# Patient Record
Sex: Male | Born: 1935 | Race: White | Hispanic: No | Marital: Married | Smoking: Former smoker
Health system: Southern US, Community
[De-identification: ages and names within clinical notes are randomized; demographics above are authoritative.]

## PROBLEM LIST (undated history)

## (undated) DIAGNOSIS — E039 Hypothyroidism, unspecified: Secondary | ICD-10-CM

## (undated) DIAGNOSIS — I5032 Chronic diastolic (congestive) heart failure: Secondary | ICD-10-CM

## (undated) DIAGNOSIS — Z951 Presence of aortocoronary bypass graft: Secondary | ICD-10-CM

## (undated) DIAGNOSIS — I714 Abdominal aortic aneurysm, without rupture, unspecified: Secondary | ICD-10-CM

## (undated) DIAGNOSIS — Z860100 Personal history of colon polyps, unspecified: Secondary | ICD-10-CM

## (undated) DIAGNOSIS — I252 Old myocardial infarction: Secondary | ICD-10-CM

## (undated) DIAGNOSIS — I10 Essential (primary) hypertension: Secondary | ICD-10-CM

## (undated) DIAGNOSIS — M1990 Unspecified osteoarthritis, unspecified site: Secondary | ICD-10-CM

## (undated) DIAGNOSIS — R399 Unspecified symptoms and signs involving the genitourinary system: Secondary | ICD-10-CM

## (undated) DIAGNOSIS — K5730 Diverticulosis of large intestine without perforation or abscess without bleeding: Secondary | ICD-10-CM

## (undated) DIAGNOSIS — C8590 Non-Hodgkin lymphoma, unspecified, unspecified site: Secondary | ICD-10-CM

## (undated) DIAGNOSIS — N400 Enlarged prostate without lower urinary tract symptoms: Secondary | ICD-10-CM

## (undated) DIAGNOSIS — Z973 Presence of spectacles and contact lenses: Secondary | ICD-10-CM

## (undated) DIAGNOSIS — E782 Mixed hyperlipidemia: Secondary | ICD-10-CM

## (undated) DIAGNOSIS — Z955 Presence of coronary angioplasty implant and graft: Secondary | ICD-10-CM

## (undated) DIAGNOSIS — I4510 Unspecified right bundle-branch block: Secondary | ICD-10-CM

## (undated) DIAGNOSIS — C7951 Secondary malignant neoplasm of bone: Principal | ICD-10-CM

## (undated) DIAGNOSIS — R918 Other nonspecific abnormal finding of lung field: Secondary | ICD-10-CM

## (undated) DIAGNOSIS — G629 Polyneuropathy, unspecified: Secondary | ICD-10-CM

## (undated) DIAGNOSIS — C61 Malignant neoplasm of prostate: Secondary | ICD-10-CM

## (undated) DIAGNOSIS — I2510 Atherosclerotic heart disease of native coronary artery without angina pectoris: Secondary | ICD-10-CM

## (undated) DIAGNOSIS — Z972 Presence of dental prosthetic device (complete) (partial): Secondary | ICD-10-CM

## (undated) DIAGNOSIS — R339 Retention of urine, unspecified: Secondary | ICD-10-CM

## (undated) DIAGNOSIS — Z952 Presence of prosthetic heart valve: Secondary | ICD-10-CM

## (undated) DIAGNOSIS — R011 Cardiac murmur, unspecified: Secondary | ICD-10-CM

## (undated) HISTORY — DX: Chronic diastolic (congestive) heart failure: I50.32

## (undated) HISTORY — DX: Secondary malignant neoplasm of bone: C79.51

## (undated) HISTORY — PX: COLONOSCOPY: SHX174

## (undated) HISTORY — PX: CARDIAC CATHETERIZATION: SHX172

## (undated) HISTORY — PX: CORONARY ANGIOPLASTY WITH STENT PLACEMENT: SHX49

## (undated) HISTORY — DX: Malignant neoplasm of prostate: C61

## (undated) HISTORY — PX: CATARACT EXTRACTION W/ INTRAOCULAR LENS  IMPLANT, BILATERAL: SHX1307

## (undated) HISTORY — PX: CORONARY ARTERY BYPASS GRAFT: SHX141

## (undated) HISTORY — PX: TRANSTHORACIC ECHOCARDIOGRAM: SHX275

## (undated) HISTORY — DX: Non-Hodgkin lymphoma, unspecified, unspecified site: C85.90

## (undated) HISTORY — DX: Cardiac murmur, unspecified: R01.1

## (undated) HISTORY — PX: KNEE ARTHROSCOPY: SUR90

## (undated) HISTORY — DX: Hypothyroidism, unspecified: E03.9

---

## 1971-10-09 HISTORY — PX: HEMORRHOID SURGERY: SHX153

## 2001-02-05 ENCOUNTER — Other Ambulatory Visit: Admission: RE | Admit: 2001-02-05 | Discharge: 2001-02-05 | Payer: Self-pay | Admitting: Internal Medicine

## 2001-07-22 ENCOUNTER — Ambulatory Visit (HOSPITAL_COMMUNITY): Admission: RE | Admit: 2001-07-22 | Discharge: 2001-07-22 | Payer: Self-pay | Admitting: Gastroenterology

## 2001-07-22 ENCOUNTER — Encounter (INDEPENDENT_AMBULATORY_CARE_PROVIDER_SITE_OTHER): Payer: Self-pay | Admitting: *Deleted

## 2003-12-29 ENCOUNTER — Inpatient Hospital Stay (HOSPITAL_COMMUNITY): Admission: EM | Admit: 2003-12-29 | Discharge: 2004-01-01 | Payer: Self-pay | Admitting: Emergency Medicine

## 2004-03-15 ENCOUNTER — Encounter: Admission: RE | Admit: 2004-03-15 | Discharge: 2004-03-15 | Payer: Self-pay | Admitting: Internal Medicine

## 2004-09-19 ENCOUNTER — Encounter: Admission: RE | Admit: 2004-09-19 | Discharge: 2004-09-19 | Payer: Self-pay | Admitting: Internal Medicine

## 2004-09-21 ENCOUNTER — Other Ambulatory Visit: Admission: RE | Admit: 2004-09-21 | Discharge: 2004-09-21 | Payer: Self-pay | Admitting: Otolaryngology

## 2004-10-03 ENCOUNTER — Encounter (INDEPENDENT_AMBULATORY_CARE_PROVIDER_SITE_OTHER): Payer: Self-pay | Admitting: Specialist

## 2004-10-03 ENCOUNTER — Encounter (INDEPENDENT_AMBULATORY_CARE_PROVIDER_SITE_OTHER): Payer: Self-pay | Admitting: Otolaryngology

## 2004-10-03 ENCOUNTER — Ambulatory Visit (HOSPITAL_COMMUNITY): Admission: RE | Admit: 2004-10-03 | Discharge: 2004-10-03 | Payer: Self-pay | Admitting: Otolaryngology

## 2004-10-03 HISTORY — PX: OTHER SURGICAL HISTORY: SHX169

## 2004-10-11 ENCOUNTER — Other Ambulatory Visit: Admission: RE | Admit: 2004-10-11 | Discharge: 2004-10-11 | Payer: Self-pay | Admitting: Otolaryngology

## 2004-10-12 ENCOUNTER — Ambulatory Visit: Payer: Self-pay | Admitting: Hematology & Oncology

## 2004-10-19 ENCOUNTER — Ambulatory Visit: Payer: Self-pay | Admitting: Hematology & Oncology

## 2004-10-22 ENCOUNTER — Ambulatory Visit (HOSPITAL_COMMUNITY): Admission: RE | Admit: 2004-10-22 | Discharge: 2004-10-22 | Payer: Self-pay | Admitting: Hematology & Oncology

## 2004-10-24 ENCOUNTER — Ambulatory Visit (HOSPITAL_COMMUNITY): Admission: RE | Admit: 2004-10-24 | Discharge: 2004-10-24 | Payer: Self-pay | Admitting: Hematology & Oncology

## 2004-10-24 ENCOUNTER — Encounter (INDEPENDENT_AMBULATORY_CARE_PROVIDER_SITE_OTHER): Payer: Self-pay | Admitting: Specialist

## 2004-10-26 ENCOUNTER — Encounter: Admission: RE | Admit: 2004-10-26 | Discharge: 2004-10-26 | Payer: Self-pay | Admitting: Hematology & Oncology

## 2004-11-29 ENCOUNTER — Ambulatory Visit: Payer: Self-pay | Admitting: Hematology & Oncology

## 2004-12-04 ENCOUNTER — Ambulatory Visit (HOSPITAL_COMMUNITY): Admission: RE | Admit: 2004-12-04 | Discharge: 2004-12-04 | Payer: Self-pay | Admitting: Hematology & Oncology

## 2005-01-22 ENCOUNTER — Ambulatory Visit: Payer: Self-pay | Admitting: Hematology & Oncology

## 2005-02-26 ENCOUNTER — Ambulatory Visit (HOSPITAL_COMMUNITY): Admission: RE | Admit: 2005-02-26 | Discharge: 2005-02-26 | Payer: Self-pay | Admitting: Hematology & Oncology

## 2005-03-02 ENCOUNTER — Ambulatory Visit (HOSPITAL_COMMUNITY): Admission: RE | Admit: 2005-03-02 | Discharge: 2005-03-02 | Payer: Self-pay | Admitting: Hematology & Oncology

## 2005-03-12 ENCOUNTER — Ambulatory Visit: Admission: RE | Admit: 2005-03-12 | Discharge: 2005-05-31 | Payer: Self-pay | Admitting: Radiation Oncology

## 2005-03-20 ENCOUNTER — Ambulatory Visit: Payer: Self-pay | Admitting: Dentistry

## 2005-03-20 ENCOUNTER — Encounter: Admission: EM | Admit: 2005-03-20 | Discharge: 2005-03-20 | Payer: Self-pay | Admitting: Dentistry

## 2005-05-08 ENCOUNTER — Emergency Department (HOSPITAL_COMMUNITY): Admission: EM | Admit: 2005-05-08 | Discharge: 2005-05-09 | Payer: Self-pay | Admitting: Emergency Medicine

## 2005-07-02 ENCOUNTER — Ambulatory Visit: Payer: Self-pay | Admitting: Hematology & Oncology

## 2005-07-04 ENCOUNTER — Ambulatory Visit (HOSPITAL_COMMUNITY): Admission: RE | Admit: 2005-07-04 | Discharge: 2005-07-04 | Payer: Self-pay | Admitting: Hematology & Oncology

## 2005-07-09 ENCOUNTER — Ambulatory Visit (HOSPITAL_COMMUNITY): Admission: RE | Admit: 2005-07-09 | Discharge: 2005-07-09 | Payer: Self-pay | Admitting: Hematology & Oncology

## 2005-10-08 DIAGNOSIS — C8590 Non-Hodgkin lymphoma, unspecified, unspecified site: Secondary | ICD-10-CM

## 2005-10-08 HISTORY — DX: Non-Hodgkin lymphoma, unspecified, unspecified site: C85.90

## 2005-10-10 ENCOUNTER — Ambulatory Visit (HOSPITAL_COMMUNITY): Admission: RE | Admit: 2005-10-10 | Discharge: 2005-10-10 | Payer: Self-pay | Admitting: Hematology & Oncology

## 2005-10-24 ENCOUNTER — Ambulatory Visit: Payer: Self-pay | Admitting: Hematology & Oncology

## 2006-01-09 ENCOUNTER — Ambulatory Visit (HOSPITAL_COMMUNITY): Admission: RE | Admit: 2006-01-09 | Discharge: 2006-01-09 | Payer: Self-pay | Admitting: Hematology & Oncology

## 2006-01-22 ENCOUNTER — Ambulatory Visit: Payer: Self-pay | Admitting: Hematology & Oncology

## 2006-01-23 LAB — CBC WITH DIFFERENTIAL/PLATELET
BASO%: 0.4 % (ref 0.0–2.0)
HCT: 41.9 % (ref 38.7–49.9)
HGB: 13.9 g/dL (ref 13.0–17.1)
MCV: 86.8 fL (ref 81.6–98.0)
MONO#: 0.6 10*3/uL (ref 0.1–0.9)
MONO%: 9.6 % (ref 0.0–13.0)
NEUT#: 4.1 10*3/uL (ref 1.5–6.5)
RBC: 4.83 10*6/uL (ref 4.20–5.71)
lymph#: 1.4 10*3/uL (ref 0.9–3.3)

## 2006-01-23 LAB — COMPREHENSIVE METABOLIC PANEL
ALT: 14 U/L (ref 0–40)
AST: 20 U/L (ref 0–37)
Alkaline Phosphatase: 72 U/L (ref 39–117)
CO2: 30 mEq/L (ref 19–32)
Glucose, Bld: 90 mg/dL (ref 70–99)
Potassium: 5.1 mEq/L (ref 3.5–5.3)
Total Bilirubin: 0.7 mg/dL (ref 0.3–1.2)
Total Protein: 6.4 g/dL (ref 6.0–8.3)

## 2006-01-23 LAB — LACTATE DEHYDROGENASE: LDH: 126 U/L (ref 94–250)

## 2006-02-21 ENCOUNTER — Ambulatory Visit (HOSPITAL_COMMUNITY): Admission: RE | Admit: 2006-02-21 | Discharge: 2006-02-21 | Payer: Self-pay | Admitting: General Surgery

## 2006-02-21 HISTORY — PX: INGUINAL HERNIA REPAIR: SUR1180

## 2006-06-17 ENCOUNTER — Ambulatory Visit: Admission: RE | Admit: 2006-06-17 | Discharge: 2006-06-17 | Payer: Self-pay | Admitting: Hematology & Oncology

## 2006-06-27 ENCOUNTER — Ambulatory Visit: Payer: Self-pay | Admitting: Hematology & Oncology

## 2006-07-01 LAB — LACTATE DEHYDROGENASE: LDH: 118 U/L (ref 94–250)

## 2006-07-01 LAB — CBC WITH DIFFERENTIAL/PLATELET
BASO%: 0.9 % (ref 0.0–2.0)
Basophils Absolute: 0 10*3/uL (ref 0.0–0.1)
EOS%: 3.7 % (ref 0.0–7.0)
Eosinophils Absolute: 0.2 10*3/uL (ref 0.0–0.5)
HCT: 41.4 % (ref 38.7–49.9)
HGB: 13.9 g/dL (ref 13.0–17.1)
MCHC: 33.7 g/dL (ref 32.0–35.9)
MCV: 86.2 fL (ref 81.6–98.0)
NEUT%: 54.1 % (ref 40.0–75.0)
WBC: 5.5 10*3/uL (ref 4.0–10.0)
lymph#: 1.7 10*3/uL (ref 0.9–3.3)

## 2006-07-01 LAB — COMPREHENSIVE METABOLIC PANEL
AST: 18 U/L (ref 0–37)
Alkaline Phosphatase: 65 U/L (ref 39–117)
Calcium: 9 mg/dL (ref 8.4–10.5)
Creatinine, Ser: 0.78 mg/dL (ref 0.40–1.50)
Glucose, Bld: 80 mg/dL (ref 70–99)
Total Protein: 6.2 g/dL (ref 6.0–8.3)

## 2006-08-19 ENCOUNTER — Ambulatory Visit (HOSPITAL_COMMUNITY): Admission: RE | Admit: 2006-08-19 | Discharge: 2006-08-19 | Payer: Self-pay | Admitting: Ophthalmology

## 2006-12-25 ENCOUNTER — Ambulatory Visit: Payer: Self-pay | Admitting: Hematology & Oncology

## 2008-10-07 ENCOUNTER — Ambulatory Visit: Payer: Self-pay | Admitting: Diagnostic Radiology

## 2008-10-07 ENCOUNTER — Encounter: Payer: Self-pay | Admitting: Emergency Medicine

## 2008-10-07 ENCOUNTER — Inpatient Hospital Stay (HOSPITAL_COMMUNITY): Admission: AD | Admit: 2008-10-07 | Discharge: 2008-10-08 | Payer: Self-pay | Admitting: Orthopedic Surgery

## 2008-10-07 HISTORY — PX: OTHER SURGICAL HISTORY: SHX169

## 2010-03-20 ENCOUNTER — Encounter: Admission: RE | Admit: 2010-03-20 | Discharge: 2010-03-20 | Payer: Self-pay | Admitting: Orthopedic Surgery

## 2010-03-22 ENCOUNTER — Ambulatory Visit (HOSPITAL_BASED_OUTPATIENT_CLINIC_OR_DEPARTMENT_OTHER): Admission: RE | Admit: 2010-03-22 | Discharge: 2010-03-22 | Payer: Self-pay | Admitting: Orthopedic Surgery

## 2010-10-16 ENCOUNTER — Ambulatory Visit
Admission: RE | Admit: 2010-10-16 | Payer: Self-pay | Source: Home / Self Care | Attending: Interventional Cardiology | Admitting: Interventional Cardiology

## 2010-10-20 ENCOUNTER — Ambulatory Visit (HOSPITAL_COMMUNITY)
Admission: RE | Admit: 2010-10-20 | Discharge: 2010-10-21 | Payer: Self-pay | Source: Home / Self Care | Attending: Interventional Cardiology | Admitting: Interventional Cardiology

## 2010-10-23 LAB — BASIC METABOLIC PANEL
BUN: 11 mg/dL (ref 6–23)
CO2: 29 mEq/L (ref 19–32)
Calcium: 9.2 mg/dL (ref 8.4–10.5)
Chloride: 104 mEq/L (ref 96–112)
Creatinine, Ser: 0.96 mg/dL (ref 0.4–1.5)
GFR calc Af Amer: >60 mL/min (ref >60)
GFR calc non Af Amer: >60 mL/min (ref >60)
Glucose, Bld: 94 mg/dL (ref 70–99)
Potassium: 4.8 mEq/L (ref 3.5–5.1)
Sodium: 139 mEq/L (ref 135–145)

## 2010-10-23 LAB — CBC
HCT: 44.5 % (ref 39.0–52.0)
Hemoglobin: 14.6 g/dL (ref 13.0–17.0)
MCH: 28.6 pg (ref 26.0–34.0)
MCHC: 32.8 g/dL (ref 30.0–36.0)
MCV: 87.3 fL (ref 78.0–100.0)
Platelets: 150 10*3/uL (ref 150–400)
RBC: 5.1 MIL/uL (ref 4.22–5.81)
RDW: 14.2 % (ref 11.5–15.5)
WBC: 6.3 10*3/uL (ref 4.0–10.5)

## 2010-10-29 ENCOUNTER — Encounter: Payer: Self-pay | Admitting: Hematology & Oncology

## 2010-11-16 NOTE — Procedures (Signed)
  NAME:  Tim Campbell, Tim Campbell NO.:  [id number]  MEDICAL RECORD NO.:  [id number]          PATIENT TYPE:  OIB  LOCATION:  6529                         FACILITY:  [facility]  PHYSICIAN:  Lyn Records, M.D.   DATE OF BIRTH:  Nov 29, 1925  DATE OF PROCEDURE:  10/20/2010 DATE OF DISCHARGE:                           CARDIAC CATHETERIZATION   INDICATIONS FOR PROCEDURE:  Progressive class III angina starting 2 months ago.  Recent diagnostic catheterization demonstrating 90% ostial stenosis in the saphenous vein graft to the right coronary.  PROCEDURE PERFORMED:  Bare-metal stent, ostium of the RCA graft.  DESCRIPTION:  After informed consent, we performed insertion of a 6- French arterial sheath in the right femoral.  The modified Seldinger technique was used.  The patient received 2 mg of Versed and 50 mcg of fentanyl.  A bolus followed by an infusion of Angiomax was started.  ACT was documented to be greater than 300 seconds.  The patient had 300 mg of Plavix in the holding area before being brought to the cath lab.  He had been on Plavix for 5 days as an outpatient.  After obtaining guiding shots using a 6-French multipurpose guide catheter, we placed a BMW wire through the ostial stenosis to give [place] support and the vessel.  After that, we advanced a filter wire into the distal body of the graft.  The filter wire was then deployed.  We advanced a 4.0 x 15 mm long Vision stent into the region of stenosis being careful to overlap the ostium with a small portion of stent extending into the aorta.  We then deployed the stent to 14 atmospheres. We then removed the deployment balloon and placed a 4.5 x 12 mm [place] Trek. Four balloon inflations were performed up to a peak pressure of 18 atmospheres.  We then removed this balloon and did more focal angioplasty at the ostium with an 8 mm long x 5.0 mm [place] Trek.  Three balloon inflations were performed to peak pressure of 14  atmospheres. We then retrieved the basket.  Final shots were obtained.  TIMI grade 3 flow was noted.  RESULTS:  90+ percent stenosis reduced to less than 20% with TIMI grade 3 flow, and the development of a wide ostium that appears to be at least 4 mm in diameter.  The distal graft in the region of basket deployment was not injured.  CONCLUSION:  Successful ostial RCA graft stent deployment using bare- metal with resulting less than 20% ostial narrowing.  Postdilatation pressure 18 atmospheres and largest balloon diameter 5.0 mm.  PLAN:  Aspirin and Plavix for at least 6 weeks, but preferably for a year.  Risk factor modification of the patient will allow it.  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     Lyn Records, M.D.     HWS/MEDQ  D:  10/20/2010  T:  10/21/2010  Job:  [id number]  cc:   Candyce Churn, M.D.  Electronically Signed by Verdis Prime M.D. on 11/16/2010 04:57:21 PM

## 2010-11-16 NOTE — Discharge Summary (Signed)
  NAME:  Tim, Campbell NO.:  [id number]  MEDICAL RECORD NO.:  [id number]          PATIENT TYPE:  OIB  LOCATION:  6529                         FACILITY:  [facility]  PHYSICIAN:  Lyn Records, M.D.   DATE OF BIRTH:  23-Feb-1926  DATE OF ADMISSION:  10/20/2010 DATE OF DISCHARGE:  10/21/2010                              DISCHARGE SUMMARY   REASON FOR ADMISSION:  Elective RCA graft stenting.  DISCHARGE DIAGNOSES: 1. Coronary atherosclerosis with class III exertional angina.     a.     A 90% ostial saphenous vein graft to right coronary artery      stenosis.     b.     Successful bare-metal stenting on October 20, 2010. 2. Poor risk factor modifications.     a.     Hyperlipidemia not treated.     b.     Elevated blood pressure, not treated.  PROCEDURE PERFORMED:  Bare-metal stent implantation in ostium of the right coronary, saphenous vein graft.  DISCHARGE INSTRUCTIONS: 1. Follow up with Dr. Verdis Prime on October 27, 2010, at 2:00 p.m. 2. Medications at discharge: 3. Crestor 20 mg per day. 4. Plavix 75 mg per day. 5. Coated aspirin 325 mg per day. 6. Folic acid 1 mg per day. 7. The patient takes multiple naturopathic therapies OTC.  He will     continue these.  CONDITION ON DISCHARGE:  Improved.  HISTORY AND PHYSICAL AND HOSPITAL COURSE:  Please see the admitting information in the patient's chart.  He has been having angina for 2 months.  The patient was found to have a high-grade stenosis in the ostium of the saphenous vein graft to the right coronary by diagnostic cath earlier this week.  On the day of admission, he underwent successful bare-metal stenting of the ostium of the RCA, saphenous vein graft using a 5.0-mm balloon to post dilate.  A nice angiographic result was obtained although there was still some mild balloon waist, had pressures as high as 14 atmospheres with the 5.0-mm balloon.  An 18 atmospheres of pressure with a 4.5-mm balloon did  not demonstrate any evidence of balloon waist.  The patient's creatinine preprocedure is 0.84.  Creatinine will be evaluated prior to discharge home on October 21, 2010.  Followup with Dr. Verdis Prime has been scheduled as outlined above.     Lyn Records, M.D.     HWS/MEDQ  D:  10/20/2010  T:  10/21/2010  Job:  [id number]  cc:   Candyce Churn, M.D.  Electronically Signed by Verdis Prime M.D. on 11/16/2010 04:57:18 PM

## 2010-12-24 LAB — POCT HEMOGLOBIN-HEMACUE: Hemoglobin: 14.7 g/dL (ref 13.0–17.0)

## 2010-12-25 LAB — BASIC METABOLIC PANEL
BUN: 14 mg/dL (ref 6–23)
Calcium: 9.1 mg/dL (ref 8.4–10.5)
Chloride: 105 mEq/L (ref 96–112)
Creatinine, Ser: 0.81 mg/dL (ref 0.4–1.5)
Glucose, Bld: 98 mg/dL (ref 70–99)

## 2011-02-15 ENCOUNTER — Inpatient Hospital Stay (INDEPENDENT_AMBULATORY_CARE_PROVIDER_SITE_OTHER)
Admission: RE | Admit: 2011-02-15 | Discharge: 2011-02-15 | Disposition: A | Discharge status: Home / Self Care | Payer: Medicare Other | Source: Ambulatory Visit | Attending: Family Medicine | Admitting: Family Medicine

## 2011-02-15 ENCOUNTER — Encounter: Payer: Self-pay | Admitting: Family Medicine

## 2011-02-15 DIAGNOSIS — S61409A Unspecified open wound of unspecified hand, initial encounter: Secondary | ICD-10-CM

## 2011-02-19 ENCOUNTER — Telehealth (INDEPENDENT_AMBULATORY_CARE_PROVIDER_SITE_OTHER): Payer: Self-pay | Admitting: *Deleted

## 2011-02-20 NOTE — Op Note (Signed)
NAME:  Tim Campbell, Tim Campbell NO.:  [id number]   MEDICAL RECORD NO.:  [id number]          PATIENT TYPE:  INP   LOCATION:  5033                         FACILITY:  [facility]   PHYSICIAN:  Feliberto Gottron. Turner Daniels, M.D.   DATE OF BIRTH:  31-Jan-1926   DATE OF PROCEDURE:  10/07/2008  DATE OF DISCHARGE:                               OPERATIVE REPORT   PREOPERATIVE DIAGNOSIS:  Right ankle trimalleolar fracture dislocation.   POSTOPERATIVE DIAGNOSIS:  Right ankle trimalleolar fracture dislocation.   PROCEDURE:  Open reduction and internal fixation of right ankle  trimalleolar fracture dislocation with two medial 4 mm Synthes  cancellous lag screw and laterally a 7-hole one-third tubular  neutralization plate with 7 screws and a single anterior-to-posterior  interfragmentary screw.   SURGEON:  Feliberto Gottron. Turner Daniels, MD   FIRST ASSISTANT:  Shirl Harris, PA-C   ANESTHETIC:  General endotracheal.   ESTIMATED BLOOD LOSS:  100 mL   FLUID REPLACEMENT:  1200 mL of crystalloid.   DRAINS PLACED:  None.   TOURNIQUET TIME:  45 minutes.   INDICATIONS FOR PROCEDURE:  This is an 77 year old gentleman who slipped  on the ice going out to get this newspaper on the morning of October 07, 2008, was transported to the [facility] Emergency Room on 68.  X-  rays were obtained.  Fracture dislocation was noted.  A closed reduction  was accomplished in the emergency room and he was placed in the  posterior splint and Orthopedic consultation obtained.  He was  transferred to [facility] for surgical intervention and  for appropriate preoperative workup and counseling was taken to the  operating room.  The risks and benefits of surgery were discussed and  questions answered.  The indications for surgery was an unstable  fracture dislocation and the purpose of the surgery is to stabilize the  ankle, decrease pain, and increase function.   DESCRIPTION OF PROCEDURE:  The patient identified by  armband and taken  to the preop area at [facility] where the appropriate anesthetic  monitors were attached and 2 g of Ancef were given intravenously  preoperatively.  He was then taken to operating room floor, the  appropriate anesthetic monitors were attached and general endotracheal  anesthesia was induced.  Tourniquet was applied high to the right calf  and the right lower extremity prepped and draped in usual sterile  fashion from the toes to the tourniquet.  The patient did have a small  superficial abrasion medially over the medial malleolus that was not  bleeding and went just into the epidermis, it was clean.  The limb was  then prepped and draped in usual sterile fashion.  Standard time-out  procedure was performed.  The limb wrapped with an Esmarch bandage,  tourniquet inflated to 300 mmHg, and began the operation by making a  longitudinal medial incision starting just distal to the tip of the  medial malleolus and going proximally and slightly posteriorly over a  distance of about 6 cm through the skin and subcutaneous tissue.  Small  bleeders were identified and cauterized.  The  periosteum over the  fracture site was noted to be torn.  We immediately identified the major  medial malleolus fracture fragments, they were intact distally with some  comminution posteriorly.  The edge of the periosteum were cleaned off  the fracture site.  The wound was irrigated out with normal saline  solution.  One small bit of cartilage was removed from the joint itself  under direct vision and then we reduced the fracture using a calibrated  tenaculum and providing provisional fixation with a single K-wire going  through the tip of the medial malleolus up into the metaphyseal flare of  the ankle.  The guide pin was placed anteriorly and posteriorly to this.  We went ahead and drilled with the 2.5 drill from the Synthes set going  from the tip of the malleolus again up into the metaphysis,  drilled the  distal fragment at 3.5 mm and then placed the posterior fixation screw,  through the tip of the medial malleolus, through the fracture site, and  lagged the malleolus back down to the tibia.  The anterior guide pin was  then removed and a second lag screw was placed obtaining good firm  fixation of the medial malleolus anteriorly and straight medially.  Posteriorly, there was some comminution near the posterior tib tendon.  This was felt to not be amenable to fixation minimal, but a flap of  periosteum was placed over it for closure.  We then directed our  attention laterally where a straight longitudinal incision was made from  segment of lateral malleolus proximally for about 8 cm through the skin  and subcutaneous tissue.  Small bleeders were identified and cauterized.  Small branches of the superficial peroneal nerve and sural nerve were  identified and protected.  The periosteum of the fracture site was  reflected anteriorly and posteriorly visualizing the SE-2 fracture  pattern.  This was then cleansed with normal saline solution and using a  lion-jaw clamp we were able to reduce the lateral malleolus anatomically  and fix it with an anterior-to-posterior 3.5 lag screw.  A 7-hole one-  third tubular plate was then contoured to fit the lateral fibula and  fixed with 4 proximal bicortical screws and 3 distal unicortical 4 mm  screws.  C-arm images were then taken in the AP, mortise, and lateral  planes confirming near anatomic reduction and the tourniquet was let  down.  Small bleeders were identified and cauterized.  The subcutaneous  tissue was closed with running 3-0 nylon suture and the skin itself with  running interlocking 3-0 Vicryl suture.  A dressing of Xeroform, 4x4  dressing, sponges, Webril, Ace wrap, and a standard large Cam walker  boot were then applied.  The patient was then awakened and taken to the  recovery room without difficulty.      Feliberto Gottron.  Turner Daniels, M.D.  Electronically Signed     FJR/MEDQ  D:  10/07/2008  T:  10/08/2008  Job:  [id number]

## 2011-02-23 NOTE — Cardiovascular Report (Signed)
NAME:  Tim Campbell, Tim Campbell NO.:  [id number]   MEDICAL RECORD NO.:  [id number]                   PATIENT TYPE:  INP   LOCATION:  4741                                 FACILITY:  [facility]   PHYSICIAN:  Francisca December, M.D.               DATE OF BIRTH:  [birth year]   DATE OF PROCEDURE:  12/30/2003  DATE OF DISCHARGE:                              CARDIAC CATHETERIZATION   PROCEDURE:  1. Left heart catheterization.  2. Coronary angiography.  3. Left ventriculogram.  4. Saphenous vein graft angiography.  5. Arterial conduit angiography.   INDICATIONS FOR PROCEDURE:  [name] is a 77 year old man who is  now 12 years status post coronary artery bypass grafting. He has done well  until the recent past, when he developed an accelerating anginal syndrome.  He had prolonged chest discomfort yesterday and has ruled in for a non-ST  segment elevation myocardial infarction. Peak CK if 100. Troponin 19. He is  without chest discomfort currently. He is brought to the catheterization lab  at this time to identify the extent of disease and provide for further  therapeutic options.   DESCRIPTION OF PROCEDURE:  The patient was brought to the cardiac  catheterization laboratory in the fasting state. The right groin was prepped  and draped in the usual sterile fashion. Local anesthesia was obtained with  infiltration of 1% Lidocaine. A 6 French catheter sheath was inserted  percutaneously into the right femoral artery, utilizing an anterior approach  over a guiding J-wire. A 110 cm pigtail catheter was used to measure  pressures in the ascending aorta and in the left ventricle, both prior to  and following the ventriculogram. A 30 degree RAO cine left ventriculogram  was performed utilizing a power injector. Forty five cc of non-ionic  contrast material were injected at 13 cc per second. The pigtail catheter  was then exchanged for a 6 French #4 left Judkins catheter.  Cine angiography  of left coronary artery was conducted in LAO and RAO projections. The left  Judkins catheter was then exchanged for a 6 French #4 right Judkins  catheter. Cine angiography of the right coronary to saphenous vein graft to  the diagonal branch, the saphenous vein graft to the obtuse marginal were  then performed. The right coronary catheter was then manipulated into the  left subclavian artery. Using an exchange length wire, it was removed and  exchanged for a LIMA catheter. Cine angiography of the LIMA to the LAD was  connected in AP, LAO, and RAO projections. The LIMA catheter was then  exchanged for a RCB catheter. Cine angiography of the saphenous vein graft  to the right coronary artery was conducted in LAO and RAO projections. At  the completion of the procedure, the catheter and catheter sheath were  removed. Hemostasis was achieved by direct pressure. The patient was  transported to the recovery area in stable condition  with an intact distal  pulse. It should be noted that all catheter manipulations were performed  using fluoroscopic observation and exchanges performed over the long guiding  J-wire.   HEMODYNAMICS:  Systemic arterial pressure was 104/65 with a mean of 82 mmHg.  There was no systolic gradient across the aortic valve. The left ventricular  end diastolic pressure was 17 mmHg.   ANGIOGRAPHY:  The left ventriculogram demonstrated normal size and mildly  reduced global systolic function with a visually estimated ejection fraction  of 45%. There was anterolateral mild dyskinesis that was focal. There was  inferobasal akinesis. There was mild mitral regurgitation. The aortic valve  was trileaflet and opened normally during systole. There was left and right  coronary calcification seen.   There was a right dominant coronary system present. The main left coronary  artery was short and normal. The left anterior descending artery and its  branches were  highly diseased; the left vessels completely occluded after  the origin of her septal perforator. It was diffusely diseased proximal to  that.   The left circumflex coronary is small and gives rise only to a small  posterolateral branch. There is also a tiny first marginal branch. It is  diffusely diseased but without significant obstruction.   The right coronary artery is completely occluded in the mid portion. There  is a small right jump collateral, barely filling the distal vessel. There is  a moderate sized acute marginal that has an 80% stenosis at its origin.   SAPHENOUS VEIN GRAFT ANGIOGRAPHY:  The saphenous vein graft to the diagonal  branch is completely occluded about 2 cm from its origin and has a  thrombotic appearance with a meniscus.   The saphenous vein graft to the obtuse marginal is mildly, diffusely  diseased with luminal irregularities but no significant obstruction. It  inserts upon a large to moderate size marginal branch and provides antegrade  and retrograde filling. The retrograde filling is trivial in nature. The  antegrade filling reaches the apex. There are no significant obstructions in  the native artery.   The saphenous vein graft to the right coronary artery is widely patent and  without significant obstruction or even luminal irregularity. It inserts  upon the posterior descending artery and then continues onto a large left  ventricular branch. This could even be a distal left circumflex  posterolateral branch. There are no obstructions in the conduit itself,  either to the posterior descending artery or extending to the posterolateral  branch. There are no significant obstructions in the native artery and the  anastomoses are free of obstruction as well.   The left internal mammary artery graft to the left anterior descending is  widely patent and inserts upon the mid to distal anterior descending artery. It provides antegrade and retrograde  filling. Antegrade filling reaches and  barely traverses the apex. The artery is diffusely diseased but without  significant obstruction, retrograde filling provides flow to 3 small septal  perforators and then is completely occluded proximally. No obstructions are  seen in the insertion site of the left internal mammary artery to the left  anterior descending.   Very faint collateral filling is seen to an extremely small diagonal branch  off of the left anterior descending.   IMPRESSION:  1. Atherosclerotic coronary vascular disease, severe 3 vessel.  2. Recent occlusion of saphenous vein graft to the diagonal, causing non-ST     segment elevation myocardial infarction, anterolateral.  3. Mildly diminished left  ventricular global systolic function. Ejection     fraction 45%, regional wall motion abnormality is as noted.  4. Very mildly elevated left ventricular end diastolic pressure.   PLAN/RECOMMENDATIONS:  With a peak CK of 100, dyskinesis in the  anterolateral and a complete occlusion of the marginal branch, I do not feel  that intervention is at all warranted. Medical therapy only is appropriate.  He will be continued on his current medications with the addition of Plavix  to his aspirin and other medications.                                               Francisca December, M.D.    JHE/MEDQ  D:  12/30/2003  T:  01/02/2004  Job:  [id number]   cc:   Lyn Records III, M.D.  [address]  [address]  [place]  [place] [address]  Fax: [phone]   Candyce Churn, M.D.  [address] [place]  [place] [address]  Fax: [phone]

## 2011-02-23 NOTE — Op Note (Signed)
NAME:  Tim Campbell, Tim Campbell NO.:  [id number]   MEDICAL RECORD NO.:  [id number]          PATIENT TYPE:  AMB   LOCATION:  SDS                          FACILITY:  [facility]   PHYSICIAN:  Adolph Pollack, M.D.DATE OF BIRTH:  1925/10/18   DATE OF PROCEDURE:  02/21/2006  DATE OF DISCHARGE:                                 OPERATIVE REPORT   PREOPERATIVE DIAGNOSIS:  Right inguinal hernia.   POSTOPERATIVE DIAGNOSIS:  Right inguinal hernia (indirect).   PROCEDURE:  Right inguinal hernia repair with mesh.   SURGEON:  Adolph Pollack, M.D.   ASSISTANT:  Devra Dopp, M.D.   ANESTHESIA:  General plus mixture of 0.5% Marcaine and 1% lidocaine with  epinephrine.   INDICATIONS:  [name] is a 77 year old male who was having a right  inguinal bulge, it is uncomfortable, and he has a hernia.  He is  progressively having more discomfort from it.  He now he now presents for  right inguinal hernia repair with mesh.  We have discussed the procedure  risks and aftercare preoperatively.   TECHNIQUE:  He was seen in the holding area and the right groin marked with  my initials.  He is then brought to the operating room, placed supine on the  operating table, and general anesthetic was administered.  The hair in the  right groin was clipped and the area sterilely prepped and draped.  Local  anesthetic was infiltrated superficially and deep in the right groin and the  right groin incision was made through the skin, subcutaneous tissue, and  Scarpa's fascia until the external oblique aponeurosis was exposed.  More  local anesthetic was infiltrated deep to the external oblique aponeurosis  then an incision was made in the external oblique aponeurosis through the  internal ring medially and up toward the anterior superior iliac spine  laterally.  Using blunt dissection, the shelving edge of the inguinal  ligament was exposed inferiorly and the internal oblique muscle and  aponeurosis were exposed superiorly.  I then isolated the spermatic cord.  An indirect sac was noted with extraperitoneal fatty adherent to it.  This  was dissected free from the cord and reduced back to a patulous internal  ring.   Following this, a piece of 3 by 6 inch polypropylene mesh was brought into  the field and anchored 1 cm medial to the pubic tubercle with 2-0 Prolene  suture.  The inferior aspect of the mesh was anchored to the shelving edge  of the inguinal ligament with a running 2-0 Prolene suture until an area 1  cm lateral to the internal ring.  A slit was cut in the mesh and two tails  wrapped around the cord.  The superior aspect of the mesh was then anchored  to the internal oblique muscle and aponeurosis with interrupted 2-0 Vicryl  sutures.  The two tails of the mesh were crossed creating a new internal  ring and these were anchored to the shelving edge of the inguinal ligament  with 2-0 Prolene suture.  The tip of the hemostat was able to be placed in  the new aperture and the cord was mobile.   The lateral aspect of the mesh was then tucked deep to the external oblique  aponeurosis.  Hemostasis was adequate.  More local anesthetic was  infiltrated superiorly.  I then closed the external oblique aponeurosis over  the mesh and  cord with a running 3-0 Vicryl suture.  Scarpa's fascia was approximated  with a running 2-0 Vicryl suture.  The skin was closed with a mono 4-0  Monocryl subcuticular stitch followed by Steri-Strips and sterile dressings.  He tolerated procedure without any apparent complications and was taken to  the recovery in satisfactory condition.      Adolph Pollack, M.D.  Electronically Signed     TJR/MEDQ  D:  02/21/2006  T:  02/21/2006  Job:  [id number]   cc:   Rose Phi. Myna Hidalgo, M.D.  Fax: [phone]   Candyce Churn, M.D.  Fax: [phone]

## 2011-02-23 NOTE — Discharge Summary (Signed)
NAME:  Tim Campbell, Tim Campbell NO.:  [id number]   MEDICAL RECORD NO.:  [id number]                   PATIENT TYPE:  INP   LOCATION:  4741                                 FACILITY:  [facility]   PHYSICIAN:  Francisca December, M.D.               DATE OF BIRTH:  1925-12-05   DATE OF ADMISSION:  12/29/2003  DATE OF DISCHARGE:  01/01/2004                                 DISCHARGE SUMMARY   ADMISSION DIAGNOSES:  1. Chest pain with non-Q wave myocardial infarction.  2. Coronary artery disease status post coronary artery bypass graft in 1993.  3. Hyperlipidemia.  4. Medical noncompliance.   DISCHARGE DIAGNOSES:  1. Coronary artery disease, status post non-Q wave myocardial infarction.     Medical therapy only.  2. Coronary artery disease status post coronary artery bypass graft in 1993.  3. Hyperlipidemia.  4. Medical noncompliance.   PROCEDURES:  Left-heart catheterization, December 30, 2003.   COMPLICATIONS:  None.   DISCHARGE STATUS:  Stable, improved.   ADMISSION HISTORY:  Please see complete H&P for details.  In short, this is  a 77 year old male with known coronary artery disease, being status post  coronary artery bypass graft from 21.  He was admitted on December 29, 2003,  with the complaint of intermittent substernal chest pain with occasional  radiation to his left shoulder.  He denied diaphoresis or nausea.  He took  antiacids and tried to drink some soda without much relief.  He also tried  sublingual nitroglycerin without any relief.  However, he states his  prescription was old.  Apparently he was given a nitroglycerin by one of his  friends which did relieve his discomfort.  He presented to the emergency  room for further treatment and evaluation.   Please see complete H&P for details.   PHYSICAL EXAMINATION:  In short, his vital signs were stable.  His physical  examination was essentially without any abnormalities.  EKG showed a normal  sinus rhythm  with a right bundle branch block with no acute ischemic  changes.  Admission laboratory work including CBC and BNP were normal.  Cardiac enzymes showed CK-MB of 10.7, troponin of 0.30, rising to 7.46 with  a CK-MB of 25.   HOSPITAL COURSE:  He was admitted for treatment of his non-Q wave myocardial  infarction.  He was started on IV heparin, IV nitroglycerin.  P.o. Lopressor  and aspirin were also continued.  Plans for cardiac catheterization were  ensued.   The next day, he had no further chest pain.  Symptoms resolved in the ER.  He was taken to the cardiac catheterization laboratory with Dr. Amil Amen on  December 30, 2003.  Results showed severe three-vessel disease.  A recent  occlusion of the SVG to the diagonal which was believed to be the culprit  for his non-Q wave myocardial infarction.  Mildly diminished LV function  with an EF of 45%  and some regional wall motion abnormalities.   Medical therapy was ensured at this point with the feeling that no  intervention was necessary on the occluded marginal branch.  He had no  recurrent chest pain. He was up and ambulating with cardiac rehabilitation  without any complaints or difficulties.  Vital signs remained stable.  Catheterization site was without bruising, hematoma or bruits.  By January 01, 2004, he was able to go home, and thus was discharge without any problems.   DISCHARGE MEDICATIONS:  1. Plavix 75 mg daily.  2. Aspirin 81 mg daily.  3. Zocor 40 mg daily.  4. Nitroglycerin 0.4 mg p.r.n.   DISCHARGE INSTRUCTIONS:  1. Activity as tolerated.  2. He was to maintain a low-fat, low-cholesterol diet.  3. He is to contact Dr. Katrinka Blazing for any pain, bruising or swelling at the     catheterization site.  4. He will need to see Dr. Katrinka Blazing in four weeks for followup.  He is to call     for any appointment.      Adrian Saran, N.P.                        Francisca December, M.D.    HB/MEDQ  D:  01/17/2004  T:  01/18/2004  Job:   [id number]

## 2011-02-23 NOTE — Op Note (Signed)
NAME:  Tim Campbell, Tim Campbell NO.:  [id number]   MEDICAL RECORD NO.:  [id number]          PATIENT TYPE:  AMB   LOCATION:  OMED                         FACILITY:  [facility]   PHYSICIAN:  Rose Phi. Myna Hidalgo, M.D. DATE OF BIRTH:  [birth year]   DATE OF PROCEDURE:  10/24/2004  DATE OF DISCHARGE:                                 OPERATIVE REPORT   NATURE OF PROCEDURE:  Left posterior iliac crest bone marrow biopsy and  aspirate.   Preoperatively, [name] was brought to the short stay unit.  He had an  IV placed into his right hand.  He was then placed onto his right side.  He  had 5 mg of Versed and 25 mg of Demerol for sedation.   The left posterior iliac crest region was prepped and draped in a sterile  fashion.  He had 10 cc of lidocaine infiltrating under the skin down to the  periosteum.   A #11 scalpel was used to make an incision into the skin.  Two bone marrow  aspirates were obtained without difficulty.   A second incision was made into the skin.  Two bone marrow biopsy cores were  obtained without difficulty.   The patient tolerated the procedure well.  There were no complications.      PRE/MEDQ  D:  10/24/2004  T:  10/24/2004  Job:  [id number]

## 2011-02-23 NOTE — H&P (Signed)
NAME:  Tim Campbell, Tim Campbell NO.:  [id number]   MEDICAL RECORD NO.:  [id number]                   PATIENT TYPE:  EMS   LOCATION:  MAJO                                 FACILITY:  [facility]   PHYSICIAN:  Cassell Clement, M.D.              DATE OF BIRTH:  20-Jun-1926   DATE OF ADMISSION:  12/29/2003  DATE OF DISCHARGE:                                HISTORY & PHYSICAL   CHIEF COMPLAINT:  Chest pain.   HISTORY:  This is a 77 year old married Caucasian gentleman admitted through  the emergency room with crescendo angina.  He has a history of known  coronary artery disease.  He had coronary artery bypass graft surgery x5 in  May of 1993 by Dr. Ramon Dredge B. Gerhardt.  He had no subsequent trouble until  the past month, when he began having intermittent substernal chest tightness  which he thought was indigestion.  He states that his last stress test by  Dr. Lyn Records was 2 or 3 years ago and that he did okay at that time.  For the past month, he has been experiencing some intermittent episodes of  substernal tightness occasionally radiating to the left shoulder.  There has  been nausea, vomiting or diaphoresis.  The pain is not related to exertion.  Tonight, the pain was more prolonged and not responding to antacids as it  usually did.  He had some old outdated sublingual nitroglycerins at home  which he tried and they were of no effect.  He drove himself to the  emergency room.  After arrival here, he was given oxygen and sublingual  nitroglycerin and noted relief of pain.  At the present time, he is not  experiencing any significant chest discomfort.  Cardiac enzymes point of  care show evidence of myocardial damage and he is being admitted for further  evaluation.   FAMILY HISTORY:  His family history reveals that his father died at age 23  of an enlarged heart.  Mother died at 66 of strokes and Alzheimer's.  He has  3 brothers and 4 sisters, all living and 1 has  had a stent.   SOCIAL HISTORY:  He is a retired Airline pilot.  He is married and has  3 daughters.  He quit smoking at age 51 and he does not drink any alcohol.   ALLERGIES:  He denies any drug allergies.   PRESENT MEDICATIONS:  He states that he takes very little prescribed  medicine.  1. He is supposed to be on Zocor but is noncompliant and takes it only half     the time.  2. He is on a lot of herbal therapy.  3. He is also on aspirin 325 mg daily.  4. Folic acid.  5. Vitamin E 400 units a day.  6. Vitamin C.  7. He also takes a remedy called Prostate Defense.  8. Previously he was taking  Hytrin for mild high blood pressure but stopped     it and states his blood pressure has remained normal.   PAST MEDICAL HISTORY:  The patient is not diabetic.   REVIEW OF SYSTEMS:  Review of systems reveals no history of peptic ulcer.  He had a normal colonoscopy several years ago by Dr. Danise Edge.  Genitourinary history reveals some slowing of his urinary stream secondary  to BPH.   MEDICAL PHYSICIAN:  Dr. Candyce Churn is his medical physician.   PHYSICAL EXAMINATION:  VITAL SIGNS:  On physical examination, blood pressure  is 114/58, pulse 75, regular, respirations are normal, weight is 262.  GENERAL:  He is a well-developed, well-nourished white male in no distress.  He denies any significant chest discomfort at the present time with IV  nitroglycerin running.  HEAD AND NECK:  Skin warm and dry.  Pupils equal and reactive.  Sclerae  clear.  Mouth and pharynx normal.  Carotids normal.  Jugular venous pressure  normal.  Thyroid normal.  CHEST:  Chest is clear to percussion and auscultation.  HEART:  Heart reveals no murmur, gallop or click.  ABDOMEN:  The abdomen is obese, nontender; no mass.  EXTREMITIES:  Extremities show good pulses, no edema.   LABORATORY AND ACCESSORY CLINICAL DATA:  His electrocardiogram shows normal  sinus rhythm with a right bundle branch  block and no acute changes and we do  not have any old films or EKGs for comparison.   Chest x-ray shows no active disease.   Cardiac enzymes are elevated at CK-MB 10.7, 11.6 and 25; point of care with  troponin, point of care being elevated at 0.3, 0.62 and 1.46.   DIAGNOSTIC IMPRESSION:  1. Crescendo angina with non-Q-wave myocardial infarction.  2. Right bundle branch block,  uncertain age.  3. Status post coronary artery bypass graft, 1993, by Dr. Ramon Dredge B.     Gerhardt.  4. History of borderline hypertension.  5. Exogenous obesity.  6. History of hyperlipidemia.   DISPOSITION:  We are admitting to telemetry to Dr. Francisca December in Dr.  Sherilyn Cooter Smith's absence.  The patient is being treated with IV heparin, IV  nitroglycerin, aspirin and Lopressor and continued statin therapy.  Anticipate cardiac catheterization in a.m. by Dr. Amil Amen and [facility].                                                Cassell Clement, M.D.    TB/MEDQ  D:  12/29/2003  T:  12/30/2003  Job:  [id number]   cc:   Candyce Churn, M.D.  [address] [place]  [place] [address]  Fax: [phone]   Lyn Records III, M.D.  [address]  [place]  [place] [address]  Fax: [phone]   Francisca December, M.D.  [address]  [address]  [place]  [place] [address]  Fax: [phone]

## 2011-02-23 NOTE — Op Note (Signed)
NAME:  Tim Campbell, Tim Campbell NO.:  [id number]   MEDICAL RECORD NO.:  [id number]          PATIENT TYPE:  OIB   LOCATION:  2899                         FACILITY:  [facility]   PHYSICIAN:  Jefry H. Pollyann Kennedy, MD     DATE OF BIRTH:  Jun 12, 1926   DATE OF PROCEDURE:  10/03/2004  DATE OF DISCHARGE:                                 OPERATIVE REPORT   PREOPERATIVE DIAGNOSIS:  Left posterior neck mass.   POSTOPERATIVE DIAGNOSIS:  Left posterior neck mass.   PROCEDURE:  Open biopsy of left cervical mass.   SURGEON:  Jefry H. Pollyann Kennedy, M.D.   ANESTHESIA:  General endotracheal.   COMPLICATIONS:  None.   BLOOD LOSS:  Minimal.   FINDINGS:  A large infiltrating mass deep to the trapezius muscle in the  upper left posterior cervical region adjacent to the base of the skull.  Frozen section findings consistent with possible lymphoma, we will await  final pathologic findings.   REFERRING PHYSICIAN:  Candyce Churn, M.D.   HISTORY:  This is a 77 year old gentleman who developed a large infiltrating  mass at the craniovertebral junction, left posterior neck.  Needle  aspiration biopsy was inadequate.  The mass was felt to be too deep to be  reached by a needle safely.  The risks, benefits, alternatives, and  complications of the procedure were explained to the patient who understands  and agrees with surgery.   DESCRIPTION OF PROCEDURE:  The patient was taken to the operating room and  placed on the operating table in the supine position.  Following induction  of general endotracheal anesthesia the patient was repositioned in a right  lateral position and supported with gel rolls and a Mayo stand for the left  arm.  The neck was shaved and prepped and draped in the standard fashion.  The incision was outlined in a backwards L fashion posterior to the mastoid  process.  Electrocautery was used to incise the skin and subcutaneous  tissue.  Careful dissection down through the layers of  the trapezius was  performed.  Muscle fibers were divided and the mass was exposed.  A 15  scalpel was  used to incise a portion of the mass to send for frozen section evaluation.  The wound was then closed using 3-0 chromic for the deep layer and  subcutaneous layer and Benzoin and Steri-Strips were placed on the skin.  A  dressing was applied.  The patient was then awakened, extubated and  transferred to recovery in stable condition.      Jefr   JHR/MEDQ  D:  10/03/2004  T:  10/03/2004  Job:  [id number]   cc:   Candyce Churn, M.D.  [address] [place]  [place] [address]  Fax: [phone]

## 2011-02-23 NOTE — Procedures (Signed)
[place], [place]  Patient:    Tim Campbell, Tim Campbell Visit Number: [id number] MRN: [id number]          Service Type: Attending:  Verlin Grills, M.D. Dictated by:   Verlin Grills, M.D. Proc. Date: 07/22/01   CC:         Pearla Dubonnet, M.D.   Procedure Report  DATE OF BIRTH:  1926/07/06  REFERRING PHYSICIAN:  Pearla Dubonnet, M.D.  PROCEDURE PERFORMED:  Colonoscopy.  ENDOSCOPIST:  Verlin Grills, M.D.  INDICATIONS FOR PROCEDURE:  The patient is a 77 year old male.  On Feb 05, 2001, he underwent his health maintenance flexible proctosigmoidoscopy performed by Dr. Johnella Moloney.  At approximately 35 cm from the anal verge, a 3 mm mildly erythematous flat polyp was discovered behind a mucosal fold. Colonic diverticulosis was also noted.  [name] is referred for surveillance colonoscopy with polypectomy to prevent colon cancer.  I discussed with the patient the complications associated with colonoscopy and polypectomy including a 15 per 1000 risk of bleeding and 4 per 1000 risk of colonic perforation requiring surgical repair.  The patient has signed the operative permit.  PREMEDICATION:  Versed 5 mg, fentanyl 25 mcg.  ENDOSCOPE:  Olympus pediatric video colonoscope.  DESCRIPTION OF PROCEDURE:  After obtaining informed consent, the patient was placed in the left lateral decubitus position.  I administered intravenous fentanyl and intravenous Versed to achieve conscious sedation for the procedure.  The patients blood pressure, oxygen saturation and cardiac rhythm were monitored throughout the procedure and documented in the medical record.  Anal inspection was normal.  I was unable to reach the prostate during my digital rectal examination.  The Olympus pediatric video colonoscope was then introduced into the rectum and under direct vision, advanced to the proximal ascending colon.  Due to colonic loop  formation, I was unable to intubate the cecum.  I got an adequate examination of the cecum and ileocecal valve rolling the patient from the left lateral decubitus position to the supine position and finally the right lateral decubitus position.  There were no lesions in the cecum.  Colonic preparation for the exam today was excellent.  Rectum:  Normal.  Sigmoid colon and descending colon:  Extensive left colonic diverticulosis.  Splenic flexure:  Normal.  Transverse colon:  Normal.  Hepatic flexure and ascending colon:  Three 0.5 mm sessile polyps were removed from the distal ascending colon with a cold biopsy forceps.  A 2 mm sessile polyp was removed with electrocautery snare from the hepatic flexure.  Cecum and ileocecal valve:  Although I was unable to intubate the cecum with the endoscope, I got an excellent view of the cecal mucosa with the endoscope advanced to the proximal ascending colon and I dont feel that an air contrast barium enema would add anything further for cecum-ileocecal examination. There were no lesions noted in the cecum.  ASSESSMENT: 1. Left colonic diverticulosis. 2. From the distal ascending three 0.5 mm sessile polyps were removed with the    cold biopsy forceps.  A 2 mm sessile polyp was removed with the    electrocautery snare from the hepatic flexure.  RECOMMENDATIONS:  Await colon polyp pathology to determine when [name] needs a follow-up colonoscopy. Dictated by:   Verlin Grills, M.D. Attending:  Verlin Grills, M.D. DD:  07/22/01 TD:  07/22/01 Job: [id number]

## 2011-02-25 ENCOUNTER — Encounter: Payer: Self-pay | Admitting: Family Medicine

## 2011-02-25 ENCOUNTER — Inpatient Hospital Stay (INDEPENDENT_AMBULATORY_CARE_PROVIDER_SITE_OTHER)
Admission: RE | Admit: 2011-02-25 | Discharge: 2011-02-25 | Disposition: A | Discharge status: Home / Self Care | Payer: Medicare Other | Source: Ambulatory Visit | Attending: Family Medicine | Admitting: Family Medicine

## 2011-02-25 DIAGNOSIS — S61409A Unspecified open wound of unspecified hand, initial encounter: Secondary | ICD-10-CM

## 2011-02-25 DIAGNOSIS — Z4802 Encounter for removal of sutures: Secondary | ICD-10-CM

## 2011-07-13 LAB — CBC
Hemoglobin: 14.7 g/dL (ref 13.0–17.0)
WBC: 6.8 10*3/uL (ref 4.0–10.5)

## 2011-07-13 LAB — BASIC METABOLIC PANEL
CO2: 27 mEq/L (ref 19–32)
Chloride: 103 mEq/L (ref 96–112)
Creatinine, Ser: 0.8 mg/dL (ref 0.4–1.5)
Glucose, Bld: 101 mg/dL — ABNORMAL HIGH (ref 70–99)
Potassium: 4.2 mEq/L (ref 3.5–5.1)
Sodium: 137 mEq/L (ref 135–145)

## 2011-07-13 LAB — APTT: aPTT: 31 seconds (ref 24–37)

## 2011-07-13 LAB — DIFFERENTIAL
Basophils Relative: 1 % (ref 0–1)
Eosinophils Absolute: 0.2 10*3/uL (ref 0.0–0.7)
Lymphocytes Relative: 17 % (ref 12–46)
Lymphs Abs: 1.2 10*3/uL (ref 0.7–4.0)
Monocytes Relative: 8 % (ref 3–12)
Neutro Abs: 4.9 10*3/uL (ref 1.7–7.7)

## 2011-07-13 LAB — PROTIME-INR: Prothrombin Time: 14.4 seconds (ref 11.6–15.2)

## 2011-09-06 ENCOUNTER — Emergency Department (INDEPENDENT_AMBULATORY_CARE_PROVIDER_SITE_OTHER): Payer: Medicare Other

## 2011-09-06 ENCOUNTER — Emergency Department (HOSPITAL_BASED_OUTPATIENT_CLINIC_OR_DEPARTMENT_OTHER)
Admission: EM | Admit: 2011-09-06 | Discharge: 2011-09-06 | Disposition: A | Discharge status: Home / Self Care | Payer: Medicare Other | Attending: Emergency Medicine | Admitting: Emergency Medicine

## 2011-09-06 DIAGNOSIS — S62309A Unspecified fracture of unspecified metacarpal bone, initial encounter for closed fracture: Secondary | ICD-10-CM

## 2011-09-06 DIAGNOSIS — X58XXXA Exposure to other specified factors, initial encounter: Secondary | ICD-10-CM

## 2011-09-06 DIAGNOSIS — S62329A Displaced fracture of shaft of unspecified metacarpal bone, initial encounter for closed fracture: Secondary | ICD-10-CM | POA: Insufficient documentation

## 2011-09-06 DIAGNOSIS — S6290XA Unspecified fracture of unspecified wrist and hand, initial encounter for closed fracture: Secondary | ICD-10-CM

## 2011-09-06 DIAGNOSIS — IMO0002 Reserved for concepts with insufficient information to code with codable children: Secondary | ICD-10-CM | POA: Insufficient documentation
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
## 2011-09-06 NOTE — ED Notes (Signed)
Right hand injury-injured with twist while drilling yesterday

## 2011-09-07 NOTE — ED Provider Notes (Signed)
History/physical exam/procedure(s) were performed by non-physician practitioner and as supervising physician I was immediately available for consultation/collaboration. I have reviewed all notes and am in agreement with care and plan.   Viann Nielson S Parris Signer, MD 09/07/11 0732 

## 2011-09-10 NOTE — Progress Notes (Signed)
Summary: CUT ON HAND Procedure room   Vital Signs:  Patient Profile:   77 Years Old Male CC:      Laceration on rt hand on piece of metal yesterday Height:     70 inches Weight:      226 pounds O2 Sat:      96 % O2 treatment:    Room Air Temp:     98.4 degrees F oral Pulse rate:   79 / minute Pulse rhythm:   regular Resp:     16 per minute BP sitting:   124 / 72  (left arm) Cuff size:   regular  Vitals Entered By: Emilio Math (Feb 15, 2011 12:27 PM)                  Current Allergies: No known allergies History of Present Illness Chief Complaint: Laceration on rt hand on piece of metal yesterday History of Present Illness:  Subjective:  Patient states that he cut his right hand on sharp edge of a piece of metal yesterday.  He washed the wound and kept it bandaged overnight.  Tetanus immunization is current.  REVIEW OF SYSTEMS Constitutional Symptoms      Denies fever, chills, night sweats, weight loss, weight gain, and fatigue.  Eyes       Denies change in vision, eye pain, eye discharge, glasses, contact lenses, and eye surgery. Ear/Nose/Throat/Mouth       Denies hearing loss/aids, change in hearing, ear pain, ear discharge, dizziness, frequent runny nose, frequent nose bleeds, sinus problems, sore throat, hoarseness, and tooth pain or bleeding.  Respiratory       Denies dry cough, productive cough, wheezing, shortness of breath, asthma, bronchitis, and emphysema/COPD.  Cardiovascular       Denies murmurs, chest pain, and tires easily with exhertion.    Gastrointestinal       Denies stomach pain, nausea/vomiting, diarrhea, constipation, blood in bowel movements, and indigestion. Genitourniary       Denies painful urination, kidney stones, and loss of urinary control. Neurological       Denies paralysis, seizures, and fainting/blackouts. Musculoskeletal       Denies muscle pain, joint pain, joint stiffness, decreased range of motion, redness, swelling, muscle  weakness, and gout.  Skin       Denies bruising, unusual mles/lumps or sores, and hair/skin or nail changes.      Comments: Lac on rt hand Psych       Denies mood changes, temper/anger issues, anxiety/stress, speech problems, depression, and sleep problems. Other Comments: Tetanus up-to-date   Past History:  Past Medical History: Unremarkable  Past Surgical History: Rt ankle Coronary artery bypass graft  Social History: Non smoker No ETOH No DRugs   Objective:  No acute distress  Right hand:  Simple superficial 2cm laceration in web space between first and second fingers.  Wound is clean without debris.  No evidence infection.  All fingers have full range of motion.  Distal neurovascular intact. Assessment New Problems: LACERATION, HAND, RIGHT (ICD-882.0)   Plan New Orders: New Patient Level III [99203] Repair Superficial Wound(s) 2.5cm or <(scalp,neck,axillae,ext gent,trunk,extre) [12001] Planning Comments:   Advised to apply Bacitracin and change bandage daily.  Return for any signs of infection.  Return 10 days for suture removal   The patient and/or caregiver has been counseled thoroughly with regard to medications prescribed including dosage, schedule, interactions, rationale for use, and possible side effects and they verbalize understanding.  Diagnoses and expected course of  recovery discussed and will return if not improved as expected or if the condition worsens. Patient and/or caregiver verbalized understanding.   PROCEDURE:  Suture Site: Right hand Size: 2cm Number of Lacerations: One Anesthesia: Local 1% lidocaine without epinephrine Procedure: Procedure:  Laceration Repair Discussed benefits and risks of procedure and verbal consent obtained. Using sterile technique and local 1% lidocaine without epinephrine, cleansed wound with Betadine followed by copious lavage with normal saline.  Wound carefully inspected for debris and foreign bodies; none found.   Wound closed with #3 , 3-0 interrupted nylon sutures.  Bacitracin and non-stick sterile dressing applied.  Wound precautions explained to patient.     Orders Added: 1)  New Patient Level III [16109] 2)  Repair Superficial Wound(s) 2.5cm or <(scalp,neck,axillae,ext gent,trunk,extre) [12001]

## 2011-09-10 NOTE — Progress Notes (Signed)
Summary: SUTURE REMOVAL/WSE   Vitals Entered By: Linton Flemings RN (Feb 25, 2011 2:21 PM)                  Updated Prior Medication List: No Medications Current Allergies: No known allergies History of Present Illness History of Present Illness:  Subjective:  Patient returns for suture removal.  No complaints  REVIEW OF SYSTEMS        Other Comments: suture removal   Past History:  Past Medical History: Reviewed history from 02/15/2011 and no changes required. Unremarkable  Past Surgical History: Reviewed history from 02/15/2011 and no changes required. Rt ankle Coronary artery bypass graft   Objective:  No acute distress  Right Hand:  laceration well-healed, no signs of infection  Plan New Orders: No Charge Patient Arrived (NCPA0) [NCPA0] Planning Comments:   Sutures removed.   The patient and/or caregiver has been counseled thoroughly with regard to medications prescribed including dosage, schedule, interactions, rationale for use, and possible side effects and they verbalize understanding.  Diagnoses and expected course of recovery discussed and will return if not improved as expected or if the condition worsens. Patient and/or caregiver verbalized understanding.   Orders Added: 1)  No Charge Patient Arrived (NCPA0) [NCPA0]

## 2011-09-10 NOTE — Telephone Encounter (Signed)
  Phone Note Outgoing Call Call back at [facility] Phone [phone]   Call placed by: Lajean Saver RN,  Feb 19, 2011 12:30 PM Call placed to: Patient Action Taken: Phone Call Completed Summary of Call: Callback: Patient reports no signs of infection in his hand. Reminded to come back on the 20th for suture removal

## 2011-11-26 DIAGNOSIS — I2510 Atherosclerotic heart disease of native coronary artery without angina pectoris: Secondary | ICD-10-CM | POA: Diagnosis not present

## 2011-11-26 DIAGNOSIS — E782 Mixed hyperlipidemia: Secondary | ICD-10-CM | POA: Diagnosis not present

## 2011-12-26 DIAGNOSIS — H3530 Unspecified macular degeneration: Secondary | ICD-10-CM | POA: Diagnosis not present

## 2011-12-31 DIAGNOSIS — I10 Essential (primary) hypertension: Secondary | ICD-10-CM | POA: Diagnosis not present

## 2011-12-31 DIAGNOSIS — I2510 Atherosclerotic heart disease of native coronary artery without angina pectoris: Secondary | ICD-10-CM | POA: Diagnosis not present

## 2011-12-31 DIAGNOSIS — E782 Mixed hyperlipidemia: Secondary | ICD-10-CM | POA: Diagnosis not present

## 2011-12-31 DIAGNOSIS — I359 Nonrheumatic aortic valve disorder, unspecified: Secondary | ICD-10-CM | POA: Diagnosis not present

## 2012-03-10 DIAGNOSIS — E559 Vitamin D deficiency, unspecified: Secondary | ICD-10-CM | POA: Diagnosis not present

## 2012-03-10 DIAGNOSIS — E538 Deficiency of other specified B group vitamins: Secondary | ICD-10-CM | POA: Diagnosis not present

## 2012-03-10 DIAGNOSIS — I10 Essential (primary) hypertension: Secondary | ICD-10-CM | POA: Diagnosis not present

## 2012-03-10 DIAGNOSIS — E782 Mixed hyperlipidemia: Secondary | ICD-10-CM | POA: Diagnosis not present

## 2012-03-10 DIAGNOSIS — I2510 Atherosclerotic heart disease of native coronary artery without angina pectoris: Secondary | ICD-10-CM | POA: Diagnosis not present

## 2012-03-10 DIAGNOSIS — Z79899 Other long term (current) drug therapy: Secondary | ICD-10-CM | POA: Diagnosis not present

## 2012-03-10 DIAGNOSIS — Z1331 Encounter for screening for depression: Secondary | ICD-10-CM | POA: Diagnosis not present

## 2012-03-10 DIAGNOSIS — Z0000 Encounter for general adult medical examination without abnormal findings: Secondary | ICD-10-CM | POA: Diagnosis not present

## 2012-05-20 DIAGNOSIS — E538 Deficiency of other specified B group vitamins: Secondary | ICD-10-CM | POA: Diagnosis not present

## 2012-05-20 DIAGNOSIS — I10 Essential (primary) hypertension: Secondary | ICD-10-CM | POA: Diagnosis not present

## 2012-05-20 DIAGNOSIS — E559 Vitamin D deficiency, unspecified: Secondary | ICD-10-CM | POA: Diagnosis not present

## 2012-05-20 DIAGNOSIS — E039 Hypothyroidism, unspecified: Secondary | ICD-10-CM | POA: Diagnosis not present

## 2012-05-20 DIAGNOSIS — Z79899 Other long term (current) drug therapy: Secondary | ICD-10-CM | POA: Diagnosis not present

## 2012-12-29 DIAGNOSIS — I10 Essential (primary) hypertension: Secondary | ICD-10-CM | POA: Diagnosis not present

## 2012-12-29 DIAGNOSIS — I2510 Atherosclerotic heart disease of native coronary artery without angina pectoris: Secondary | ICD-10-CM | POA: Diagnosis not present

## 2012-12-29 DIAGNOSIS — I359 Nonrheumatic aortic valve disorder, unspecified: Secondary | ICD-10-CM | POA: Diagnosis not present

## 2012-12-31 DIAGNOSIS — H3530 Unspecified macular degeneration: Secondary | ICD-10-CM | POA: Diagnosis not present

## 2013-01-01 ENCOUNTER — Inpatient Hospital Stay (HOSPITAL_BASED_OUTPATIENT_CLINIC_OR_DEPARTMENT_OTHER)
Admission: EM | Admit: 2013-01-01 | Discharge: 2013-01-03 | DRG: 281 | Disposition: A | Discharge status: Home / Self Care | Payer: Medicare Other | Attending: Interventional Cardiology | Admitting: Interventional Cardiology

## 2013-01-01 DIAGNOSIS — I214 Non-ST elevation (NSTEMI) myocardial infarction: Principal | ICD-10-CM | POA: Diagnosis present

## 2013-01-01 DIAGNOSIS — I25810 Atherosclerosis of coronary artery bypass graft(s) without angina pectoris: Secondary | ICD-10-CM | POA: Diagnosis not present

## 2013-01-01 DIAGNOSIS — R072 Precordial pain: Secondary | ICD-10-CM | POA: Diagnosis not present

## 2013-01-01 DIAGNOSIS — R0989 Other specified symptoms and signs involving the circulatory and respiratory systems: Secondary | ICD-10-CM | POA: Diagnosis not present

## 2013-01-01 DIAGNOSIS — I10 Essential (primary) hypertension: Secondary | ICD-10-CM | POA: Diagnosis present

## 2013-01-01 DIAGNOSIS — I25119 Atherosclerotic heart disease of native coronary artery with unspecified angina pectoris: Secondary | ICD-10-CM | POA: Diagnosis present

## 2013-01-01 DIAGNOSIS — I2510 Atherosclerotic heart disease of native coronary artery without angina pectoris: Secondary | ICD-10-CM | POA: Diagnosis present

## 2013-01-01 DIAGNOSIS — I359 Nonrheumatic aortic valve disorder, unspecified: Secondary | ICD-10-CM | POA: Diagnosis present

## 2013-01-01 HISTORY — DX: Atherosclerotic heart disease of native coronary artery without angina pectoris: I25.10

## 2013-01-01 HISTORY — DX: Essential (primary) hypertension: I10

## 2013-01-01 NOTE — ED Notes (Signed)
Pt with sob x 1 week saw cardiologist today, no new workup, cardiologist wanted to follow up in  6 months, tonight pain came back and pt felt nauseated

## 2013-01-02 ENCOUNTER — Encounter (HOSPITAL_BASED_OUTPATIENT_CLINIC_OR_DEPARTMENT_OTHER): Payer: Self-pay | Admitting: Emergency Medicine

## 2013-01-02 ENCOUNTER — Emergency Department (HOSPITAL_BASED_OUTPATIENT_CLINIC_OR_DEPARTMENT_OTHER): Payer: Medicare Other

## 2013-01-02 ENCOUNTER — Other Ambulatory Visit: Payer: Self-pay

## 2013-01-02 ENCOUNTER — Encounter (HOSPITAL_COMMUNITY)
Admission: EM | Disposition: A | Discharge status: Home / Self Care | Payer: Self-pay | Source: Home / Self Care | Attending: Interventional Cardiology

## 2013-01-02 DIAGNOSIS — R0989 Other specified symptoms and signs involving the circulatory and respiratory systems: Secondary | ICD-10-CM | POA: Diagnosis not present

## 2013-01-02 DIAGNOSIS — I214 Non-ST elevation (NSTEMI) myocardial infarction: Secondary | ICD-10-CM

## 2013-01-02 HISTORY — PX: LEFT HEART CATHETERIZATION WITH CORONARY/GRAFT ANGIOGRAM: SHX5450

## 2013-01-02 LAB — CBC
MCH: 27.5 pg (ref 26.0–34.0)
MCH: 28.3 pg (ref 26.0–34.0)
MCHC: 32 g/dL (ref 30.0–36.0)
MCHC: 33.4 g/dL (ref 30.0–36.0)
Platelets: 150 10*3/uL (ref 150–400)
Platelets: 162 10*3/uL (ref 150–400)
RBC: 5.13 MIL/uL (ref 4.22–5.81)

## 2013-01-02 LAB — BASIC METABOLIC PANEL
BUN: 19 mg/dL (ref 6–23)
CO2: 26 mEq/L (ref 19–32)
CO2: 27 mEq/L (ref 19–32)
Calcium: 9.2 mg/dL (ref 8.4–10.5)
Calcium: 9.5 mg/dL (ref 8.4–10.5)
Creatinine, Ser: 0.92 mg/dL (ref 0.50–1.35)
GFR calc non Af Amer: 67 mL/min — ABNORMAL LOW (ref >90)
Glucose, Bld: 87 mg/dL (ref 70–99)
Sodium: 138 mEq/L (ref 135–145)

## 2013-01-02 LAB — CBC WITH DIFFERENTIAL/PLATELET
Basophils Absolute: 0.1 10*3/uL (ref 0.0–0.1)
Eosinophils Absolute: 0.2 10*3/uL (ref 0.0–0.7)
Eosinophils Relative: 3 % (ref 0–5)
HCT: 43.8 % (ref 39.0–52.0)
MCH: 28.5 pg (ref 26.0–34.0)
MCHC: 33.6 g/dL (ref 30.0–36.0)
MCV: 84.9 fL (ref 78.0–100.0)
Monocytes Absolute: 0.5 10*3/uL (ref 0.1–1.0)
Platelets: 161 10*3/uL (ref 150–400)
RDW: 15 % (ref 11.5–15.5)

## 2013-01-02 LAB — HEPARIN LEVEL (UNFRACTIONATED): Heparin Unfractionated: 0.28 IU/mL — ABNORMAL LOW (ref 0.30–0.70)

## 2013-01-02 LAB — TROPONIN I
Troponin I: 0.32 ng/mL (ref <0.30)
Troponin I: 0.36 ng/mL (ref <0.30)

## 2013-01-02 LAB — CREATININE, SERUM: Creatinine, Ser: 0.91 mg/dL (ref 0.50–1.35)

## 2013-01-02 LAB — PROTIME-INR: Prothrombin Time: 13.9 seconds (ref 11.6–15.2)

## 2013-01-02 SURGERY — LEFT HEART CATHETERIZATION WITH CORONARY/GRAFT ANGIOGRAM
Anesthesia: LOCAL

## 2013-01-02 MED ORDER — SODIUM CHLORIDE 0.9 % IJ SOLN: 3.0000 mL | INTRAMUSCULAR | Status: DC | PRN

## 2013-01-02 MED ORDER — ATORVASTATIN CALCIUM 40 MG PO TABS
40.0000 mg | ORAL_TABLET | Freq: Every day | ORAL | Status: DC
  Administered 2013-01-02 (×2): 40 mg via ORAL
  Filled 2013-01-02 (×3): qty 1

## 2013-01-02 MED ORDER — METOPROLOL TARTRATE 25 MG PO TABS
25.0000 mg | ORAL_TABLET | Freq: Two times a day (BID) | ORAL | Status: DC
  Filled 2013-01-02 (×3): qty 1

## 2013-01-02 MED ORDER — MIDAZOLAM HCL 2 MG/2ML IJ SOLN
INTRAMUSCULAR | Status: AC
  Filled 2013-01-02: qty 2

## 2013-01-02 MED ORDER — ASPIRIN EC 81 MG PO TBEC
81.0000 mg | DELAYED_RELEASE_TABLET | Freq: Every day | ORAL | Status: DC
  Administered 2013-01-02: 81 mg via ORAL
  Filled 2013-01-02 (×2): qty 1

## 2013-01-02 MED ORDER — METOPROLOL TARTRATE 12.5 MG HALF TABLET
12.5000 mg | ORAL_TABLET | Freq: Two times a day (BID) | ORAL | Status: DC
  Administered 2013-01-02: 12.5 mg via ORAL
  Filled 2013-01-02 (×2): qty 1

## 2013-01-02 MED ORDER — SODIUM CHLORIDE 0.9 % IJ SOLN: 3.0000 mL | Freq: Two times a day (BID) | INTRAMUSCULAR | Status: DC

## 2013-01-02 MED ORDER — ASPIRIN 300 MG RE SUPP
300.0000 mg | RECTAL | Status: DC
  Filled 2013-01-02: qty 1

## 2013-01-02 MED ORDER — CLOPIDOGREL BISULFATE 300 MG PO TABS
300.0000 mg | ORAL_TABLET | ORAL | Status: DC
  Filled 2013-01-02: qty 1

## 2013-01-02 MED ORDER — SODIUM CHLORIDE 0.9 % IV SOLN
1.0000 mL/kg/h | INTRAVENOUS | Status: AC
  Administered 2013-01-02: 1 mL/kg/h via INTRAVENOUS

## 2013-01-02 MED ORDER — NITROGLYCERIN 0.4 MG SL SUBL: 0.4000 mg | SUBLINGUAL_TABLET | SUBLINGUAL | Status: DC | PRN

## 2013-01-02 MED ORDER — HEPARIN (PORCINE) IN NACL 100-0.45 UNIT/ML-% IJ SOLN
1200.0000 [IU]/h | INTRAMUSCULAR | Status: DC
  Filled 2013-01-02: qty 250

## 2013-01-02 MED ORDER — ENOXAPARIN SODIUM 40 MG/0.4ML ~~LOC~~ SOLN
40.0000 mg | SUBCUTANEOUS | Status: DC
  Filled 2013-01-02 (×2): qty 0.4

## 2013-01-02 MED ORDER — FENTANYL CITRATE 0.05 MG/ML IJ SOLN
INTRAMUSCULAR | Status: AC
  Filled 2013-01-02: qty 2

## 2013-01-02 MED ORDER — HEPARIN (PORCINE) IN NACL 100-0.45 UNIT/ML-% IJ SOLN
INTRAMUSCULAR | Status: AC
  Administered 2013-01-02: 1200 [IU]/h via INTRAVENOUS
  Filled 2013-01-02: qty 250

## 2013-01-02 MED ORDER — HEPARIN (PORCINE) IN NACL 100-0.45 UNIT/ML-% IJ SOLN
1350.0000 [IU]/h | INTRAMUSCULAR | Status: DC
  Filled 2013-01-02 (×2): qty 250

## 2013-01-02 MED ORDER — LIDOCAINE HCL (PF) 1 % IJ SOLN
INTRAMUSCULAR | Status: AC
  Filled 2013-01-02: qty 30

## 2013-01-02 MED ORDER — ACETAMINOPHEN 325 MG PO TABS: 650.0000 mg | ORAL_TABLET | ORAL | Status: DC | PRN

## 2013-01-02 MED ORDER — HEPARIN BOLUS VIA INFUSION
4000.0000 [IU] | Freq: Once | INTRAVENOUS | Status: AC
  Administered 2013-01-02: 4000 [IU] via INTRAVENOUS

## 2013-01-02 MED ORDER — ONDANSETRON HCL 4 MG/2ML IJ SOLN: 4.0000 mg | Freq: Four times a day (QID) | INTRAMUSCULAR | Status: DC | PRN

## 2013-01-02 MED ORDER — SODIUM CHLORIDE 0.9 % IV SOLN: 250.0000 mL | INTRAVENOUS | Status: DC | PRN

## 2013-01-02 MED ORDER — HEPARIN (PORCINE) IN NACL 2-0.9 UNIT/ML-% IJ SOLN
INTRAMUSCULAR | Status: AC
  Filled 2013-01-02: qty 1000

## 2013-01-02 MED ORDER — SODIUM CHLORIDE 0.9 % IV SOLN: INTRAVENOUS | Status: DC

## 2013-01-02 MED ORDER — CLOPIDOGREL BISULFATE 300 MG PO TABS
300.0000 mg | ORAL_TABLET | ORAL | Status: AC
Start: 2013-01-02 — End: 2013-01-02
  Administered 2013-01-02: 300 mg via ORAL
  Filled 2013-01-02: qty 1

## 2013-01-02 MED ORDER — ASPIRIN EC 81 MG PO TBEC: 81.0000 mg | DELAYED_RELEASE_TABLET | Freq: Every day | ORAL | Status: DC

## 2013-01-02 MED ORDER — ASPIRIN 81 MG PO CHEW: 324.0000 mg | CHEWABLE_TABLET | ORAL | Status: DC

## 2013-01-02 MED ORDER — CLOPIDOGREL BISULFATE 75 MG PO TABS
75.0000 mg | ORAL_TABLET | Freq: Every day | ORAL | Status: DC
  Administered 2013-01-03: 75 mg via ORAL
  Filled 2013-01-02 (×2): qty 1

## 2013-01-02 MED ORDER — ISOSORBIDE MONONITRATE ER 60 MG PO TB24
60.0000 mg | ORAL_TABLET | Freq: Every day | ORAL | Status: DC
  Administered 2013-01-02: 60 mg via ORAL
  Filled 2013-01-02 (×2): qty 1

## 2013-01-02 NOTE — ED Provider Notes (Addendum)
History     CSN: [id number]  Arrival date & time 01/01/13  2349   First MD Initiated Contact with Patient 01/02/13 0002      Chief Complaint  Patient presents with  . Chest Pain    (Consider location/radiation/quality/duration/timing/severity/associated sxs/prior treatment) HPI Comments: The patient has a past history of CAD with CABG in 1994 then with stents to the RCA in 2012.  He presents here with tightness in the chest for the past several weeks.  This seems to occur with ambulation to the mailbox, but can also occur with rest.  He was seen by his cardiologist, Dr. Katrinka Blazing three days ago but no further workup initiated.  From what I can tell, this seems to be at the patient's request.  He had symptoms this evening that lasted longer and were more severe than what he has experienced recently.    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  Patient is a 79 y.o. male presenting with chest pain. The history is provided by the patient.  Chest Pain Pain location:  Substernal area Pain quality: tightness   Pain radiates to:  Does not radiate Pain radiates to the back: no   Pain severity:  Moderate Onset quality:  Gradual Timing:  Constant Progression:  Worsening Relieved by:  Nothing Worsened by:  Nothing tried Ineffective treatments:  None tried Associated symptoms: no fever, no nausea and no shortness of breath     Past Medical History  Diagnosis Date  . Coronary artery disease   . Hypertension     Past Surgical History  Procedure Laterality Date  . Ankle surgery      History reviewed. No pertinent family history.  History  Substance Use Topics  . Smoking status: Former Games developer  . Smokeless tobacco: Not on file  . Alcohol Use: No      Review of Systems  Constitutional: Negative for fever.  Respiratory: Negative for shortness of breath.   Cardiovascular: Positive for chest pain.  Gastrointestinal: Negative for nausea.  All other systems reviewed and are  negative.    Allergies  Review of patient's allergies indicates no known allergies.  Home Medications   Current Outpatient Rx  Name  Route  Sig  Dispense  Refill  . aspirin 81 MG tablet   Oral   Take 81 mg by mouth daily.         Marland Kitchen GARLIC PO   Oral   Take 1 tablet by mouth 2 (two) times daily.           . Multiple Vitamins-Iron (CHLORELLA PO)   Oral   Take 5 tablets by mouth 2 (two) times daily.           Marland Kitchen OVER THE COUNTER MEDICATION   Oral   Take 1 tablet by mouth daily. Prostate essentials supplement          . SELENIUM PO   Oral   Take 1 tablet by mouth daily.             BP 137/84  Pulse 74  Resp 18  SpO2 98%  Physical Exam  Nursing note and vitals reviewed. Constitutional: He is oriented to person, place, and time. He appears well-developed and well-nourished. No distress.  HENT:  Head: Normocephalic and atraumatic.  Mouth/Throat: Oropharynx is clear and moist.  Neck: Normal range of motion. Neck supple.  Cardiovascular: Normal rate and regular rhythm.   No murmur heard. Pulmonary/Chest: Effort normal and breath sounds normal. No respiratory distress. He  has no wheezes.  Abdominal: Soft. Bowel sounds are normal. He exhibits no distension. There is no tenderness.  Musculoskeletal: Normal range of motion. He exhibits no edema.  Neurological: He is alert and oriented to person, place, and time.  Skin: Skin is warm and dry. He is not diaphoretic.    ED Course  Procedures (including critical care time)  Labs Reviewed  CBC  BASIC METABOLIC PANEL  TROPONIN I   No results found.   No diagnosis found.   Date: 01/02/2013  Rate: 70  Rhythm: normal sinus rhythm  QRS Axis: normal  Intervals: normal  ST/T Wave abnormalities: normal  Conduction Disutrbances:none  Narrative Interpretation:   Old EKG Reviewed: unchanged    MDM  The workup reveals an unchanged ekg but a troponin of 0.36.  Given his history, nature of the complaints, and  normal renal function, I feel as though he needs to be admitted.  I spoke with Dr. Adolm Joseph who was on call for cardiology who accepts the patient in transfer to [facility].  He does recommend starting a heparin drip.  He is pain free at this time and will be admitted to the telemetry floor.  Also of note is that the patient took two full strength aspirin prior to coming here, so none was given in the ED.   CRITICAL CARE Performed by: Geoffery Lyons   Total critical care time: 30 minutes  Critical care time was exclusive of separately billable procedures and treating other patients.  Critical care was necessary to treat or prevent imminent or life-threatening deterioration.  Critical care was time spent personally by me on the following activities: development of treatment plan with patient and/or surrogate as well as nursing, discussions with consultants, evaluation of patient's response to treatment, examination of patient, obtaining history from patient or surrogate, ordering and performing treatments and interventions, ordering and review of laboratory studies, ordering and review of radiographic studies, pulse oximetry and re-evaluation of patient's condition.      Geoffery Lyons, MD 01/02/13 0110  Geoffery Lyons, MD 01/02/13 0111  Geoffery Lyons, MD 01/02/13 0111

## 2013-01-02 NOTE — Progress Notes (Signed)
ANTICOAGULATION CONSULT NOTE - Initial Consult  Pharmacy Consult for heparin Indication: chest pain/ACS  No Known Allergies  Patient Measurements:   Heparin Dosing Weight: 83 kg  Vital Signs: Temp src: Oral (03/27 2359) BP: 120/60 mmHg (03/28 0051) Pulse Rate: 95 (03/28 0051)  Labs:  Recent Labs  01/02/13 0006  HGB 14.5  HCT 43.4  PLT 162  CREATININE 1.00  TROPONINI 0.36*    The CrCl is unknown because both a height and weight (above a minimum accepted value) are required for this calculation.   Medical History: Past Medical History  Diagnosis Date  . Coronary artery disease   . Hypertension     Medications:   (Not in a hospital admission)  Assessment: 79 yo man with known CAD to start heparin for CP Goal of Therapy:  Heparin level 0.3-0.7 units/ml Monitor platelets by anticoagulation protocol: Yes   Plan:  Heparin bolus 4000 units and drip at 1200 units/hr Check heparin level 8 hours after start. Daily heparin level and CBC while on heparin.  Tim Campbell 01/02/2013,1:21 AM

## 2013-01-02 NOTE — ED Notes (Signed)
+   troponin results called to this RN from lab, .36 results, dr. Judd Lien notified, pt continues to deny chest pain at this time

## 2013-01-02 NOTE — Progress Notes (Signed)
ANTICOAGULATION CONSULT NOTE -follow up consult  Pharmacy Consult for heparin Indication: chest pain/ACS  No Known Allergies  Patient Measurements: Height: 5\' 10"  (177.8 cm) Weight: 224 lb 3.3 oz (101.7 kg) IBW/kg (Calculated) : 73 Heparin Dosing Weight: 83 kg  Vital Signs: Temp: 97.8 F (36.6 C) (03/28 0508) Temp src: Oral (03/28 0508) BP: 119/69 mmHg (03/28 1027) Pulse Rate: 68 (03/28 1027)  Labs:  Recent Labs  01/02/13 0006 01/02/13 0113 01/02/13 0530 01/02/13 1057 01/02/13 1100  HGB 14.5  --  14.7  --   --   HCT 43.4  --  43.8  --   --   PLT 162  --  161  --   --   APTT  --  37  --   --   --   LABPROT  --  13.9  --   --   --   INR  --  1.08  --   --   --   HEPARINUNFRC  --   --   --  0.28*  --   CREATININE 1.00  --  0.92  --   --   TROPONINI 0.36*  --  0.35*  --  0.32*    Estimated Creatinine Clearance: 71.4 ml/min (by C-G formula based on Cr of 0.92).   Assessment: Mr. Vanhook was started on heparin for NSTEMI.  His heparin level is 0.28 after a 4000 unit bolus and 1200 units/hr.  This is slightly below the 0.3-0.7 goal.  No bleeding reported.   Goal of Therapy:  Heparin level 0.3-0.7 units/ml Monitor platelets by anticoagulation protocol: Yes   Plan:  1. Increase heparin drip to 1350 units/hr and f/u after cath tonight 2. Daily HL and CBC while on heparin Herby Abraham, Pharm.D. [phone] 01/02/2013 12:09 PM

## 2013-01-02 NOTE — Progress Notes (Signed)
Patient ate breakfast after RN explained NPO status. Dietary supervisor  Notified regarding NPO order; patient given wrong tray. Re-informed patient of NPO status; verbalized understanding. Will monitor. Mamie Levers

## 2013-01-02 NOTE — Progress Notes (Addendum)
The patient is real funny about meds and allowing treatment. Will negotiate with him and see what we come up with. We discussed the treatment options. He will refuse surgery. He says he will take medication if it helps him. He prematurely stopped antiplatelet terapy in 2012. Had a BMS placed. If he has ISR, will need DES. He would not totally commit to DAPT for 1 year. We have decided to cath and see what his anatomy is and make further decisions thereafter.

## 2013-01-02 NOTE — CV Procedure (Addendum)
Diagnostic Cardiac Catheterization with Bypass Graft Angiography Report  Tim Campbell  79 y.o.  male [birth year]  Procedure Date: 01/02/2013 Referring Physician: Johnella Moloney Primary Cardiologist: HWB Katrinka Blazing, III, MD   PROCEDURE:  Left heart catheterization with selective coronary angiography, left ventriculogram.  INDICATIONS:  Acute coronary syndrome with prolonged chest pain and mild elevation of troponin ischemic markers. Long-standing history of coronary artery disease with bypass grafting in 1993. Stent implantation in the ostium of the right coronary 2012. In 2012 we also noted that the saphenous vein graft to the diagonal is totally occluded. A recent is visited identified angina but the patient refused therapy and opted to attempt to lose weight and improve his diet. He refused medical therapy.  The risks, benefits, and details of the procedure were explained to the patient.  The patient verbalized understanding and wanted to proceed.  Informed written consent was obtained.  PROCEDURE TECHNIQUE:  After Xylocaine anesthesia a  5 French sheath was placed in the right femoral artery with a single anterior needle wall stick.   Coronary angiography was done using a 5 [place] #4 Judkins right, 5 [place] #4 left Judkins, and 5 [place] internal mammary artery  catheter.  Left ventriculography was done using a 5 [place] JR 4  Catheter by hand injection.    CONTRAST:  Total of 90 cc.  COMPLICATIONS:  None.    HEMODYNAMICS:  Aortic pressure was  112/67 mmHg ; LV pressure was  130/7 ; LVEDP 14 mmHg.  There was no gradient between the left ventricle and aorta.    ANGIOGRAPHIC DATA:   The left main coronary artery is  heavily calcified but patent. .  The left anterior descending artery is  diffusely diseased and totally occluded proximally..  The left circumflex artery is  totally occluded proximally with a diffusely diseased segment before the total occlusion .  The right coronary  artery is  totally occluded in the mid vessel .  BYPASS GRAFTS ANGIOGRAPHY:   The saphenous vein graft to the diagonal: Totally occluded  The saphenous vein graft to the obtuse marginal: 50% ostial narrowing  The saphenous vein graft sequential to the PDA and distal circumflex: 70-75% ostial ISR. The side-to-side anastomosis with the PDA is 99% obstructed the PDA is diffusely diseased. This would represent a progression since the prior study. The distal anastomosis to the circumflex was widely patent.  Internal mammary graft to LAD: 30-40% ostial narrowing. The LAD distal to the graft insertion is diffusely disease with segmental 90% stenoses noted the apex representing progression from prior studies.  LEFT VENTRICULOGRAM:  Left ventricular angiogram was done in the 30 RAO projection and revealed regional wall motion abnormality and mild systolic dysfunction with an estimated ejection fraction of  45-50%.  IMPRESSIONS:  1. 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 The side-to-side anastomosis with the PDA is threatened at may be the source of the patient's non-ST elevation MI. This region is not amenable to PCI.  2. Severe native vessel disease with occlusion of the LAD, circumflex, and right coronary  3. Patent LIMA to the LAD but with diffuse distal LAD disease .  4. Mild left ventricular dysfunction, EF 45 .  RECOMMENDATION:  1. Institute medical therapy  2. Progressive symptoms on medical therapy would consider  stenting the ostium of the right coronary graft. Intervention on the distal LAD is not feasible.  3. Aspirin, Plavix, long-acting nitrate therapy, statin, and low-dose beta blocker.4. Home in AM

## 2013-01-02 NOTE — H&P (Signed)
History and Physical  Patient ID: Tim Campbell MRN: [id number], SOB: [birth year] 79 y.o. Date of Encounter: 01/02/2013, 5:31 AM  Primary Physician: Seymour Bars, DO Primary Cardiologist: Dr. Mendel Ryder  Chief Complaint: chest pain  HPI: 79 y.o. male w/ PMHx significant for CAD s/p CABG 1990s (LIMA to LAD, SVG to OM, SVG to diag, SVG to RCA) who presented to [facility] last night with a prolonged episode of angina that was minimally relieved by his nitro.   He actually saw his primary cardiologists on Monday and described to him symptoms of worsening exercise tolerance (walking to the mailbox) and increasing angina symptoms. Per the patient, the plan was for close monitoring. However, on the night of admission, while do minimal work, he had sudden onset of substernal chest pressure that lasted for > 30 minutes that was unrelieved by 2 nitro. Earlier in the day he had use 2 nitro for chest discomfort. This is more than usual. His severe episode of chest pain finally improved when EMS gave him additional nitro.  Currently he feels back to baseline, resting comfortably.   EKG revealed NSR, inf Qs with no acute ST changes. CXR was without acute cardiopulmonary abnormalities. Labs are significant for elevated troponin..   Past Medical History  Diagnosis Date  . Coronary artery disease   . Hypertension    s/p CABG 1990s, previously had stent to RCA graft in 2012, diag graft known to be occluded Mild to mod AS by echo H/o nonHodgkins lymphoma s/p radiation   Surgical History:  Past Surgical History  Procedure Laterality Date  . Ankle surgery       Home Meds: Prior to Admission medications   Medication Sig Start Date End Date Taking? Authorizing Provider  aspirin 81 MG tablet Take 81 mg by mouth daily.   Yes Historical Provider, MD  GARLIC PO Take 1 tablet by mouth 2 (two) times daily.     Yes Historical Provider, MD  Multiple Vitamins-Iron (CHLORELLA PO) Take 5 tablets by  mouth 2 (two) times daily.     Yes Historical Provider, MD  OVER THE COUNTER MEDICATION Take 1 tablet by mouth daily. Prostate essentials supplement    Yes Historical Provider, MD  SELENIUM PO Take 1 tablet by mouth daily.     Yes Historical Provider, MD    Allergies: No Known Allergies  History   Social History  . Marital Status: Married    Spouse Name: N/A    Number of Children: N/A  . Years of Education: N/A   Occupational History  . Not on file.   Social History Main Topics  . Smoking status: Former Games developer  . Smokeless tobacco: Not on file  . Alcohol Use: No  . Drug Use: Not on file  . Sexually Active: Not on file   Other Topics Concern  . Not on file   Social History Narrative  . No narrative on file     History reviewed. No pertinent family history.  Review of Systems: General: negative for chills, fever, night sweats or weight changes.  Cardiovascular: see HPI Dermatological: negative for rash Respiratory: negative for cough or wheezing Urologic: negative for hematuria Abdominal: negative for nausea, vomiting, diarrhea, bright red blood per rectum, melena, or hematemesis Neurologic: negative for visual changes, syncope, or dizziness All other systems reviewed and are otherwise negative except as noted above.  Labs:   Lab Results  Component Value Date   WBC 6.7 01/02/2013   HGB 14.5 01/02/2013  HCT 43.4 01/02/2013   MCV 84.6 01/02/2013   PLT 162 01/02/2013    Recent Labs Lab 01/02/13 0006  NA 138  K 4.1  CL 102  CO2 27  BUN 20  CREATININE 1.00  CALCIUM 9.5  GLUCOSE 93    Recent Labs  01/02/13 0006  TROPONINI 0.36*    Radiology/Studies:  Dg Chest Port 1 View  01/02/2013  *RADIOLOGY REPORT*  Clinical Data: Chest pain.  PORTABLE CHEST - 1 VIEW  Comparison: 03/20/2010.  Findings: The heart is enlarged but stable.  Stable surgical changes from bypass surgery.  Mild central vascular congestion but no overt pulmonary edema.  No pleural effusions  or focal infiltrates. The bony thorax is intact.  Remote healed rib fractures are noted.  IMPRESSION: Cardiac enlargement and vascular congestion but no overt pulmonary edema.   Original Report Authenticated By: Rudie Meyer, M.D.      EKG: sinus, RSR', no ST or TW changes  Physical Exam: Blood pressure 146/70, pulse 65, temperature 97.8 F (36.6 C), temperature source Oral, resp. rate 18, SpO2 99.00%. General: Well developed, well nourished, in no acute distress. Head: Normocephalic, atraumatic, sclera non-icteric, nares are without discharge Neck: Supple. Negative for carotid bruits. JVD not elevated. Lungs: Clear bilaterally to auscultation without wheezes, rales, or rhonchi. Breathing is unlabored. Heart: RRR with S1 S2. 1/6 SEM c/w aortic valve. Abdomen: Soft, non-tender, non-distended with normoactive bowel sounds. No rebound/guarding. No obvious abdominal masses. Msk:  Strength and tone appear normal for age. Extremities: No edema. No clubbing or cyanosis. Distal pedal pulses are 2+ and equal bilaterally. Neuro: Alert and oriented X 3. Moves all extremities spontaneously. Psych:  Responds to questions appropriately with a normal affect.    Problem List 1. Accelerating angina, + troponin, NSTEMI 2. HTN 3. Aortic stenosis, min to mod per notes 4. Known CAD s/p CABG and PCI  ASSESSMENT AND PLAN:  79 y.o. male w/ PMHx significant for CAD s/p CABG 1990s (LIMA to LAD, SVG to OM, SVG to diag, SVG to RCA) who presented to [facility] last night with a prolonged episode of angina that was minimally relieved by his nitro --> found to have + biomarkers consistent with NSTEMI.  Currently chest pain free. Continue heparin, aspirin. Start low dose BB and statin for which the patient is agreeable (noted that he prefers naturopathic approaches). Consider LHC and possible PCI this AM.  Recommend echo to evaluate aortic valve if not one on record with the [facility].  Full Code NPO  for possible procedure. Heparin gtt for prophylaxis.  [facility] to follow up this AM  Signed, Driana Dazey C. MD 01/02/2013, 5:31 AM

## 2013-01-02 NOTE — H&P (Signed)
The patient presents to the catheterization laboratory after being admitted to the hospital overnight with prolonged chest discomfort. 2 sets of cardiac markers are elevated. The patient is status post coronary bypass surgery remotely. In 2012 he underwent stenting of the saphenous vein graft to the ostial right coronary. The saphenous vein graft to the diagonal is totally occluded. Saphenous vein graft to the circumflex is patent as is the LIMA to the LAD. The studies being done to define anatomy and help guide therapy. The patient is already declared that he would likely refuse repeat coronary bypass surgery. He has not totally committed to a years therapy of dual antiplatelet therapy of stenting is performed. It is probable define his anatomy and decide what treatment option is in his best option.  The patient understands the risks of the procedure including stroke, death, myocardial infarction, allergy, kidney failure, limb ischemia, among others and is willing to proceed .

## 2013-01-02 NOTE — Care Management Note (Unsigned)
    Page 1 of 1   01/02/2013     1:35:33 PM   CARE MANAGEMENT NOTE 01/02/2013  Patient:  Tim Campbell, Tim Campbell   Account Number:  [id number]  Date Initiated:  01/02/2013  Documentation initiated by:  Othella Slappey  Subjective/Objective Assessment:   PT ADM ON 01/01/13 WITH CHEST PAIN, POS TROP, NSTEMI.  PTA, PT INDEPENDENT, LIVES WITH SPOUSE.     Action/Plan:   WILL FOLLOW FOR HOME NEEDS AS PT PROGRESSES.   Anticipated DC Date:  01/05/2013   Anticipated DC Plan:  HOME W HOME HEALTH SERVICES      DC Planning Services  CM consult      Choice offered to / List presented to:             Status of service:  In process, will continue to follow Medicare Important Message given?   (If response is "NO", the following Medicare IM given date fields will be blank) Date Medicare IM given:   Date Additional Medicare IM given:    Discharge Disposition:    Per UR Regulation:  Reviewed for med. necessity/level of care/duration of stay  If discussed at Long Length of Stay Meetings, dates discussed:    Comments:

## 2013-01-03 DIAGNOSIS — I25119 Atherosclerotic heart disease of native coronary artery with unspecified angina pectoris: Secondary | ICD-10-CM | POA: Diagnosis present

## 2013-01-03 DIAGNOSIS — I10 Essential (primary) hypertension: Secondary | ICD-10-CM | POA: Diagnosis not present

## 2013-01-03 DIAGNOSIS — I25810 Atherosclerosis of coronary artery bypass graft(s) without angina pectoris: Secondary | ICD-10-CM | POA: Diagnosis not present

## 2013-01-03 DIAGNOSIS — I214 Non-ST elevation (NSTEMI) myocardial infarction: Secondary | ICD-10-CM | POA: Diagnosis not present

## 2013-01-03 MED ORDER — CLOPIDOGREL BISULFATE 75 MG PO TABS: 75.0000 mg | ORAL_TABLET | Freq: Every day | ORAL | Status: DC

## 2013-01-03 MED ORDER — METOPROLOL TARTRATE 25 MG PO TABS: 25.0000 mg | ORAL_TABLET | Freq: Two times a day (BID) | ORAL | Status: DC

## 2013-01-03 MED ORDER — ATORVASTATIN CALCIUM 40 MG PO TABS: 40.0000 mg | ORAL_TABLET | Freq: Every day | ORAL | Status: DC

## 2013-01-03 MED ORDER — ISOSORBIDE MONONITRATE ER 60 MG PO TB24: 60.0000 mg | ORAL_TABLET | Freq: Every day | ORAL | Status: DC

## 2013-01-03 MED ORDER — NITROGLYCERIN 0.4 MG SL SUBL: 0.4000 mg | SUBLINGUAL_TABLET | SUBLINGUAL | Status: DC | PRN

## 2013-01-03 NOTE — Progress Notes (Signed)
CARDIAC REHAB PHASE I   PRE:  Rate/Rhythm: 58 SB    BP: sitting 90/56    SaO2:   MODE:  Ambulation: 890 ft   POST:  Rate/Rhythm: 74 SR    BP: sitting 100/66     SaO2:   Tolerated well with long walk, no angina. Feels good. BP low but no sx. Reviewed risk factor modification and NTG. Not interested in CRPII. [id number]   Elissa Lovett [place] CES, ACSM 01/03/2013 7:35 AM

## 2013-01-03 NOTE — Discharge Summary (Addendum)
Patient ID: Tim Campbell MRN: [id number] DOB/AGE: 77-May-1927 77 y.o.  Admit date: 01/01/2013 Discharge date: 01/03/2013  Primary Discharge Diagnosis  NSTEMI secondary to progressive CAD - resolved  Secondary Discharge Diagnosis  CAD with cath 01/02/2013 showing severe native vessel disease with occlusion of LAD, left circ   RCA, patent LIMA to LAD with diffuse disease distally in LAD, 50% ostial stenosis of   SVG to distal circ, 70% ISR in SVG to PDA/distal circ, occluded SVG to diag  Mild LV dysfunction EF 45%  HTN  NonHodgkins lymphoma s/p XRT    Significant Diagnostic Studies: angiography: Diagnostic Cardiac Catheterization with Bypass Graft Angiography Report  Jance Siek Befort  78 y.o.  male  1926/02/15  Procedure Date: 01/02/2013  Referring Physician: Johnella Moloney  Primary Cardiologist: HWB Katrinka Blazing, III, MD  PROCEDURE: Left heart catheterization with selective coronary angiography, left ventriculogram.  INDICATIONS: Acute coronary syndrome with prolonged chest pain and mild elevation of troponin ischemic markers. Long-standing history of coronary artery disease with bypass grafting in 1993. Stent implantation in the ostium of the right coronary 2012. In 2012 we also noted that the saphenous vein graft to the diagonal is totally occluded. A recent is visited identified angina but the patient refused therapy and opted to attempt to lose weight and improve his diet. He refused medical therapy.  The risks, benefits, and details of the procedure were explained to the patient. The patient verbalized understanding and wanted to proceed. Informed written consent was obtained.  PROCEDURE TECHNIQUE: After Xylocaine anesthesia a 5 French sheath was placed in the right femoral artery with a single anterior needle wall stick. Coronary angiography was done using a 5 [place] #4 Judkins right, 5 [place] #4 left Judkins, and 5 [place] internal mammary artery catheter. Left ventriculography was done  using a 5 [place] JR 4 Catheter by hand injection.  CONTRAST: Total of 90 cc.  COMPLICATIONS: None.  HEMODYNAMICS: Aortic pressure was 112/67 mmHg ; LV pressure was 130/7 ; LVEDP 14 mmHg. There was no gradient between the left ventricle and aorta.  ANGIOGRAPHIC DATA: The left main coronary artery is heavily calcified but patent. .  The left anterior descending artery is diffusely diseased and totally occluded proximally..  The left circumflex artery is totally occluded proximally with a diffusely diseased segment before the total occlusion .  The right coronary artery is totally occluded in the mid vessel .  BYPASS GRAFTS ANGIOGRAPHY:  The saphenous vein graft to the diagonal: Totally occluded  The saphenous vein graft to the obtuse marginal: 50% ostial narrowing  The saphenous vein graft sequential to the PDA and distal circumflex: 70-75% ostial ISR. The side-to-side anastomosis with the PDA is 99% obstructed the PDA is diffusely diseased. This would represent a progression since the prior study. The distal anastomosis to the circumflex was widely patent.  Internal mammary graft to LAD: 30-40% ostial narrowing. The LAD distal to the graft insertion is diffusely disease with segmental 90% stenoses noted the apex representing progression from prior studies.  LEFT VENTRICULOGRAM: Left ventricular angiogram was done in the 30 RAO projection and revealed regional wall motion abnormality and mild systolic dysfunction with an estimated ejection fraction of 45-50%.  IMPRESSIONS: 1. 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 The side-to-side anastomosis with the PDA is threatened at may  be the source of the patient's non-ST elevation MI. This region is not amenable to PCI.  2. Severe native vessel disease  with occlusion of the LAD, circumflex, and right coronary  3. Patent LIMA to the LAD but with diffuse distal LAD disease .  4. Mild left ventricular dysfunction, EF 45 .  RECOMMENDATION: 1. Institute medical therapy  2. Progressive symptoms on medical therapy would consider stenting the ostium of the right coronary graft. Intervention on the distal LAD is not feasible.  3. Aspirin, Plavix, long-acting nitrate therapy, statin, and low-dose beta blocker.4. Home in AM   Consults: None  Hospital Course: 79 y.o. male w/ PMHx significant for CAD s/p CABG 1990s (LIMA to LAD, SVG to OM, SVG to diag, SVG to RCA) who presented to [facility]  with a prolonged episode of angina that was minimally relieved by his nitro. He actually saw his primary cardiologists on Monday and described to him symptoms of worsening exercise tolerance (walking to the mailbox) and increasing angina symptoms. Per the patient, the plan was for close monitoring. However, on the night of admission, while do minimal work, he had sudden onset of substernal chest pressure that lasted for > 30 minutes that was unrelieved by 2 nitro. Earlier in the day he had use 2 nitro for chest discomfort. This is more than usual. His severe episode of chest pain finally improved when EMS gave him additional nitro.  He was admitted and had a mild bump in his troponin c/w NSTEMI.  He underwent cath revealing severe native vessel disease with occlusion of LAD, left circ, RCA, patent LIMA to LAD with diffuse disease distally in LAD, 50% ostial stenosis of SVG to distal circ, 70% ISR in SVG to PDA/distal circ, occluded SVG to diag, Mild LV dysfunction EF 45%.  It was decided at this time to try aggressive medical therapy.  He was started on a statin, beta blocker, long acting nitrate and Plavix.  He was discharged to home in stable condition with no further CP.        Discharge Exam: Blood pressure 114/60, pulse 63, temperature 98 F (36.7 C),  temperature source Oral, resp. rate 16, height 5\' 10"  (1.778 m), weight 101.7 kg (224 lb 3.3 oz), SpO2 99.00%.   General appearance: alert Resp: clear to auscultation bilaterally Cardio: regular rate and rhythm, S1, S2 normal, no murmur, click, rub or gallop GI: soft, non-tender; bowel sounds normal; no masses,  no organomegaly Extremities: extremities normal, atraumatic, no cyanosis or edema Labs:   Lab Results  Component Value Date   WBC 5.4 01/02/2013   HGB 13.1 01/02/2013   HCT 41.0 01/02/2013   MCV 86.0 01/02/2013   PLT 150 01/02/2013    Recent Labs Lab 01/02/13 0530 01/02/13 1531  NA 140  --   K 3.9  --   CL 103  --   CO2 26  --   BUN 19  --   CREATININE 0.92 0.91  CALCIUM 9.2  --   GLUCOSE 87  --    Lab Results  Component Value Date   TROPONINI 0.32* 01/02/2013    No results found for this basename: CHOL   No results found for this basename: HDL   No results found for this basename: LDLCALC   No results found for this basename: TRIG   No results found for this basename: CHOLHDL   No results found for this basename: LDLDIRECT      Radiology:*RADIOLOGY REPORT*  Clinical Data:  Chest pain.  PORTABLE CHEST - 1 VIEW  Comparison: 03/20/2010.  Findings: The heart is enlarged but stable. Stable surgical  changes from bypass surgery. Mild central vascular congestion but  no overt pulmonary edema. No pleural effusions or focal  infiltrates. The bony thorax is intact. Remote healed rib  fractures are noted.  IMPRESSION:  Cardiac enlargement and vascular congestion but no overt pulmonary  edema.  Original Report Authenticated By: Rudie Meyer, M.D.  EKG: NSR, IRBBB, inferior infarct  FOLLOW UP PLANS AND APPOINTMENTS Discharge Orders   Future Orders Complete By Expires     Diet - low sodium heart healthy  As directed     Increase activity slowly  As directed     Lifting restrictions  As directed     Comments:      No heavy lifting more than 10 pounds for 1 week         Medication List    TAKE these medications       aspirin 81 MG tablet  Take 81 mg by mouth daily.     atorvastatin 40 MG tablet  Commonly known as:  LIPITOR  Take 1 tablet (40 mg total) by mouth daily at 6 PM.     CHLORELLA PO  Take 1 tablet by mouth daily.     clopidogrel 75 MG tablet  Commonly known as:  PLAVIX  Take 1 tablet (75 mg total) by mouth daily with breakfast.     GARLIC PO  Take 1 tablet by mouth 2 (two) times daily.     isosorbide mononitrate 60 MG 24 hr tablet  Commonly known as:  IMDUR  Take 1 tablet (60 mg total) by mouth daily.     metoprolol tartrate 25 MG tablet  Commonly known as:  LOPRESSOR  Take 1 tablet (25 mg total) by mouth 2 (two) times daily.     NIACIN CR PO  Take 1 tablet by mouth 2 (two) times daily.     nitroGLYCERIN 0.4 MG SL tablet  Commonly known as:  NITROSTAT  Place 1 tablet (0.4 mg total) under the tongue every 5 (five) minutes x 3 doses as needed for chest pain.     OVER THE COUNTER MEDICATION  Take 1 tablet by mouth daily. Prostate essentials supplement     SELENIUM PO  Take 1 tablet by mouth daily.     VITAMIN B 12 PO  Take 1 tablet by mouth daily.     VITAMIN C PO  Take 1 tablet by mouth daily.     VITAMIN D (ERGOCALCIFEROL) PO  Take 3 tablets by mouth daily.           Follow-up Information   Follow up with Lesleigh Noe, MD. (call for an appointment to be seen in 1 week)    Contact information:   [address] [place] [address] [phone]       BRING ALL MEDICATIONS WITH YOU TO FOLLOW UP APPOINTMENTS  Time spent with patient to include physician time:35 minutes Signed: Haasini Patnaude R 01/03/2013, 8:05 AM

## 2013-01-12 DIAGNOSIS — I2510 Atherosclerotic heart disease of native coronary artery without angina pectoris: Secondary | ICD-10-CM | POA: Diagnosis not present

## 2013-01-12 DIAGNOSIS — E782 Mixed hyperlipidemia: Secondary | ICD-10-CM | POA: Diagnosis not present

## 2013-01-12 DIAGNOSIS — I10 Essential (primary) hypertension: Secondary | ICD-10-CM | POA: Diagnosis not present

## 2013-01-12 DIAGNOSIS — I951 Orthostatic hypotension: Secondary | ICD-10-CM | POA: Diagnosis not present

## 2013-01-30 DIAGNOSIS — R609 Edema, unspecified: Secondary | ICD-10-CM | POA: Diagnosis not present

## 2013-01-30 DIAGNOSIS — I2510 Atherosclerotic heart disease of native coronary artery without angina pectoris: Secondary | ICD-10-CM | POA: Diagnosis not present

## 2013-02-27 DIAGNOSIS — I10 Essential (primary) hypertension: Secondary | ICD-10-CM | POA: Diagnosis not present

## 2013-02-27 DIAGNOSIS — I2510 Atherosclerotic heart disease of native coronary artery without angina pectoris: Secondary | ICD-10-CM | POA: Diagnosis not present

## 2013-02-27 DIAGNOSIS — I359 Nonrheumatic aortic valve disorder, unspecified: Secondary | ICD-10-CM | POA: Diagnosis not present

## 2013-03-13 DIAGNOSIS — Z79899 Other long term (current) drug therapy: Secondary | ICD-10-CM | POA: Diagnosis not present

## 2013-03-13 DIAGNOSIS — Z0000 Encounter for general adult medical examination without abnormal findings: Secondary | ICD-10-CM | POA: Diagnosis not present

## 2013-03-13 DIAGNOSIS — E559 Vitamin D deficiency, unspecified: Secondary | ICD-10-CM | POA: Diagnosis not present

## 2013-03-13 DIAGNOSIS — Z1331 Encounter for screening for depression: Secondary | ICD-10-CM | POA: Diagnosis not present

## 2013-03-13 DIAGNOSIS — I2510 Atherosclerotic heart disease of native coronary artery without angina pectoris: Secondary | ICD-10-CM | POA: Diagnosis not present

## 2013-03-13 DIAGNOSIS — E538 Deficiency of other specified B group vitamins: Secondary | ICD-10-CM | POA: Diagnosis not present

## 2013-03-13 DIAGNOSIS — E039 Hypothyroidism, unspecified: Secondary | ICD-10-CM | POA: Diagnosis not present

## 2013-03-13 DIAGNOSIS — I359 Nonrheumatic aortic valve disorder, unspecified: Secondary | ICD-10-CM | POA: Diagnosis not present

## 2013-03-13 DIAGNOSIS — I10 Essential (primary) hypertension: Secondary | ICD-10-CM | POA: Diagnosis not present

## 2013-03-25 DIAGNOSIS — E039 Hypothyroidism, unspecified: Secondary | ICD-10-CM | POA: Diagnosis not present

## 2013-03-25 DIAGNOSIS — E559 Vitamin D deficiency, unspecified: Secondary | ICD-10-CM | POA: Diagnosis not present

## 2013-05-06 DIAGNOSIS — E039 Hypothyroidism, unspecified: Secondary | ICD-10-CM | POA: Diagnosis not present

## 2013-06-10 DIAGNOSIS — I359 Nonrheumatic aortic valve disorder, unspecified: Secondary | ICD-10-CM | POA: Diagnosis not present

## 2013-06-10 DIAGNOSIS — E039 Hypothyroidism, unspecified: Secondary | ICD-10-CM | POA: Diagnosis not present

## 2013-06-10 DIAGNOSIS — I10 Essential (primary) hypertension: Secondary | ICD-10-CM | POA: Diagnosis not present

## 2013-06-10 DIAGNOSIS — E782 Mixed hyperlipidemia: Secondary | ICD-10-CM | POA: Diagnosis not present

## 2013-06-10 DIAGNOSIS — I2510 Atherosclerotic heart disease of native coronary artery without angina pectoris: Secondary | ICD-10-CM | POA: Diagnosis not present

## 2013-06-24 DIAGNOSIS — E039 Hypothyroidism, unspecified: Secondary | ICD-10-CM | POA: Diagnosis not present

## 2013-06-26 DIAGNOSIS — Z79899 Other long term (current) drug therapy: Secondary | ICD-10-CM | POA: Diagnosis not present

## 2013-06-26 DIAGNOSIS — I359 Nonrheumatic aortic valve disorder, unspecified: Secondary | ICD-10-CM | POA: Diagnosis not present

## 2013-06-26 DIAGNOSIS — I2510 Atherosclerotic heart disease of native coronary artery without angina pectoris: Secondary | ICD-10-CM | POA: Diagnosis not present

## 2013-06-26 DIAGNOSIS — I10 Essential (primary) hypertension: Secondary | ICD-10-CM | POA: Diagnosis not present

## 2013-08-25 DIAGNOSIS — E039 Hypothyroidism, unspecified: Secondary | ICD-10-CM | POA: Diagnosis not present

## 2013-09-08 ENCOUNTER — Encounter: Payer: Self-pay | Admitting: Interventional Cardiology

## 2013-09-08 ENCOUNTER — Encounter: Payer: Self-pay | Admitting: *Deleted

## 2013-09-09 ENCOUNTER — Ambulatory Visit (INDEPENDENT_AMBULATORY_CARE_PROVIDER_SITE_OTHER): Payer: Medicare Other | Admitting: Interventional Cardiology

## 2013-09-09 ENCOUNTER — Encounter: Payer: Self-pay | Admitting: Interventional Cardiology

## 2013-09-09 VITALS — BP 122/80 | HR 49 | Ht 70.0 in | Wt 229.8 lb

## 2013-09-09 DIAGNOSIS — I5043 Acute on chronic combined systolic (congestive) and diastolic (congestive) heart failure: Secondary | ICD-10-CM | POA: Insufficient documentation

## 2013-09-09 DIAGNOSIS — I10 Essential (primary) hypertension: Secondary | ICD-10-CM | POA: Diagnosis not present

## 2013-09-09 DIAGNOSIS — I5032 Chronic diastolic (congestive) heart failure: Secondary | ICD-10-CM

## 2013-09-09 DIAGNOSIS — I350 Nonrheumatic aortic (valve) stenosis: Secondary | ICD-10-CM

## 2013-09-09 DIAGNOSIS — I359 Nonrheumatic aortic valve disorder, unspecified: Secondary | ICD-10-CM

## 2013-09-09 DIAGNOSIS — I25810 Atherosclerosis of coronary artery bypass graft(s) without angina pectoris: Secondary | ICD-10-CM | POA: Diagnosis not present

## 2013-09-09 HISTORY — DX: Chronic diastolic (congestive) heart failure: I50.32

## 2013-09-09 MED ORDER — NITROGLYCERIN 0.4 MG SL SUBL: 0.4000 mg | SUBLINGUAL_TABLET | SUBLINGUAL | Status: DC | PRN

## 2013-09-09 MED ORDER — HYDROCHLOROTHIAZIDE 12.5 MG PO CAPS: 12.5000 mg | ORAL_CAPSULE | Freq: Every day | ORAL | Status: DC

## 2013-09-09 NOTE — Patient Instructions (Addendum)
Start HCTZ 12.5mg  daily. An Rx has been sent to your pharmacy  You have a follow up appointment scheduled 12/08/13 @11 :15  Please call the office if you are experiencing the following symptoms: Chest pain/pressure, shortness of breath, syncope(passing out)

## 2013-09-09 NOTE — Progress Notes (Signed)
Patient ID: PARKE JANDREAU, male   DOB: [birth year], 79 y.o.   MRN: [id number]    [address] [place], [place]  [address] Phone: [phone] Fax:  [phone]  Date:  09/09/2013   ID:  Tim Campbell, DOB 04-11-1926, MRN [id number]  PCP:  Seymour Bars, DO   ASSESSMENT:  1. Severe/critical aortic stenosis 2. Diastolic heart failure, with EF of 45% 3. Coronary disease with class III angina, due to bypass graft failure, aggravated by concomitant severe aortic stenosis. 4. Chronic right bundle branch block   PLAN:  1. Start hydrochlorothiazide 12.5 mg daily 2. We had a prolonged conversation with both the daughter and wife present. He was presented with the option for revascularization and aortic valve replacement requiring repeat open heart surgery. We also discussed the possibility of a hybrid procedure with PCI and percutaneous aortic valve replacement. We had a very rudimentary conversation around all the would be required including referral to the heart valve clinic. I did encourage the patient because he appears to be having progressive limitations due both to angina and heart failure. He has decided that he would prefer a conservative medication approach. We have left open the option to change his mind but he does understand that we may reach a point of no return where intervention is not possible. The family also clearly understands this. 3 be met in one week 4. Office visit in 3 months SUBJECTIVE: Tim Campbell is a 79 y.o. male who has severe aortic stenosis and bypass graft occlusive disease. He underwent coronary bypass grafting greater than 10 years ago. The most recent catheterization revealed evidence of bypass graft failure with 70% obstruction in the graft to the RCA, 50% in the saphenous vein graft to the circumflex, total occlusion of the graft to the diagonal but a patent left internal mammary graft. In the meantime his aortic valve disease has gotten  progressively worse and on the last echo performed in September demonstrated a valve velocity of 4.08 m/s. The LV function was mildly depressed at 45%.  He has angina with mild to moderate activity. He develops angina and shortness of breath when he stoops or bends. He likes to be active in his garden but it is becoming more limited and restricted. He denies orthopnea. There is Developing lower extremity edema. He has not had syncope.   Wt Readings from Last 3 Encounters:  09/09/13 229 lb 12.8 oz (104.237 kg)  01/02/13 224 lb 3.3 oz (101.7 kg)  01/02/13 224 lb 3.3 oz (101.7 kg)     Past Medical History  Diagnosis Date  . Coronary artery disease     with occluded SVG to diagonal and BMS to prox SVG of RCA 10/2010. Recth 12/2012, 70% ISR SVG to RCA, 50% SVG to Cfx, 100% SVG to diagonal, and Patent LIMA with diffuse LAD dz.  . Hypertension   . Non-ST elevation MI (NSTEMI)   . Hyperlipidemia   . Insomnia   . Heart murmur   . Vitamin D deficiency   . Aortic stenosis   . Hypothyroidism   . RBBB   . Diverticulosis     Current Outpatient Prescriptions  Medication Sig Dispense Refill  . aspirin 81 MG tablet Take 81 mg by mouth daily.      Marland Kitchen atorvastatin (LIPITOR) 40 MG tablet Take 1 tablet (40 mg total) by mouth daily at 6 PM.  30 tablet  11  . clopidogrel (PLAVIX) 75 MG tablet Take 1  tablet (75 mg total) by mouth daily with breakfast.  30 tablet  11  . Cyanocobalamin (VITAMIN B 12 PO) Take 1 tablet by mouth daily.      Marland Kitchen GARLIC PO Take 1 tablet by mouth every other day.       . isosorbide mononitrate (IMDUR) 60 MG 24 hr tablet Take 1 tablet (60 mg total) by mouth daily.  30 tablet  11  . levothyroxine (SYNTHROID, LEVOTHROID) 112 MCG tablet Take 112 mcg by mouth daily.      . metoprolol tartrate (LOPRESSOR) 25 MG tablet Take 1 tablet (25 mg total) by mouth 2 (two) times daily.  60 tablet  11  . NIACIN CR PO Take 1 tablet by mouth 2 (two) times daily.      . nitroGLYCERIN (NITROSTAT) 0.4  MG SL tablet Place 1 tablet (0.4 mg total) under the tongue every 5 (five) minutes x 3 doses as needed for chest pain.  25 tablet  5  . OVER THE COUNTER MEDICATION Take 1 tablet by mouth daily. Prostate essentials supplement       . SELENIUM PO Take 1 tablet by mouth daily.        Marland Kitchen VITAMIN D, ERGOCALCIFEROL, PO Take 3 tablets by mouth daily.       No current facility-administered medications for this visit.    Allergies:   No Known Allergies  Social History:  The patient  reports that he has quit smoking. He does not have any smokeless tobacco history on file. He reports that he does not drink alcohol.   ROS:  Please see the history of present illness.  Appetite is good. No chills or fever. No episodes of syncope or transient neurological complaints.   All other systems reviewed and negative.   OBJECTIVE: VS:  BP 122/80  Pulse 49  Ht 5\' 10"  (1.778 m)  Wt 229 lb 12.8 oz (104.237 kg)  BMI 32.97 kg/m2 Well nourished, well developed, in no acute distress, younger than stated age HEENT: normal Neck: JVD moderate elevation. Carotid bruit radiated from the aortic valve 1-2+ bilateral  Cardiac:  normal S1, S2; RRR; 3-4/6 crescendo decrescendo murmur of aortic stenosis. No aortic regurgitation is audible. The murmur is heard best along the left midsternal border and at the right upper sternal border. Lungs:  clear to auscultation bilaterally, no wheezing, rhonchi or rales Abd: soft, nontender, no hepatomegaly Ext: Edema bilateral ankle edema. Pulses trace to 1+ bilateral Skin: warm and dry Neuro:  CNs 2-12 intact, no focal abnormalities noted  EKG:  Q waves in V1 and V2 with prominent voltage inferior T-wave abnormality and sinus bradycardia at 49 beats per minute.       Signed, Darci Needle III, MD 09/09/2013 10:09 AM

## 2013-10-26 DIAGNOSIS — E039 Hypothyroidism, unspecified: Secondary | ICD-10-CM | POA: Diagnosis not present

## 2013-12-08 ENCOUNTER — Ambulatory Visit (INDEPENDENT_AMBULATORY_CARE_PROVIDER_SITE_OTHER): Payer: Medicare Other | Admitting: Interventional Cardiology

## 2013-12-08 ENCOUNTER — Ambulatory Visit: Payer: Medicare Other | Admitting: Interventional Cardiology

## 2013-12-08 ENCOUNTER — Encounter: Payer: Self-pay | Admitting: Interventional Cardiology

## 2013-12-08 VITALS — BP 104/68 | HR 60 | Ht 70.0 in | Wt 231.0 lb

## 2013-12-08 DIAGNOSIS — I10 Essential (primary) hypertension: Secondary | ICD-10-CM | POA: Diagnosis not present

## 2013-12-08 DIAGNOSIS — I359 Nonrheumatic aortic valve disorder, unspecified: Secondary | ICD-10-CM

## 2013-12-08 DIAGNOSIS — I25810 Atherosclerosis of coronary artery bypass graft(s) without angina pectoris: Secondary | ICD-10-CM

## 2013-12-08 DIAGNOSIS — I214 Non-ST elevation (NSTEMI) myocardial infarction: Secondary | ICD-10-CM

## 2013-12-08 DIAGNOSIS — I5032 Chronic diastolic (congestive) heart failure: Secondary | ICD-10-CM | POA: Diagnosis not present

## 2013-12-08 NOTE — Progress Notes (Signed)
Patient ID: Tim Campbell, male   DOB: 1926/09/08, 80 y.o.   MRN: [id number] Medical History: CAD/CABG with occluded SVG to diagonal and BMS to prox SVG of RCA 10/2010. Recth 12/2012, 70% ISR SVG to RCA, 50% SVG to Cfx, 100% SVG to diagonal, and Patent LIMA with diffuse LAD dz., Hypertension-resolved, diffuse large cell non-Hodgkin's lymphoma, left posterior cervical region, treated with chemotherapy and radiation, 2006 - [facility], Hyperlipidemia, BPH, Lower extremity peripheral neuropathy with paresthesias, Aortic sclerosis, Right bundle branch block, Obesity, Diverticulosis, Adenomatous colon polyps removed in 2002 and 2007, Rosacea, family history of colon cancer- followed by Dr. Earle Campbell, hypothyroidism - diagnosed 03/2012 and Synthroid 50 mcg daily started.   ECHOCARDIOGRAM 06/2013  Physician Review Conclusions: 1. Severe aortic valve stenosis. 2. Mild aortic valve regurgitation. 3. Mild mitral valve regurgitation. 4. Severe left atrial enlargement. 5. Left ventricular ejection fraction estimated by 2D at 45-50 percent. 6. There is moderate concentric left ventricle hypertrophy. 7. Mid inferior wall hypokinesis. 8. There is mild tricuspid regurgitation. 9. Mildly elevated estimated right ventricular systolic pressure. 10. Mild right atrial enlargement. 11. Analysis of mitral valve inflow, pulmonary vein Doppler and tissue Doppler suggests grade Ia diastolic dysfunction with elevated left atrial pressure. Findings: Left Ventricle: Mildly reduced left ventricular function. Left ventricular ejection fraction estimated by 2D at 45-50 percent. There is moderate concentric left ventricle hypertrophy. Mid inferior wall hypokinesis. Right Ventricle: Normal RV size. Normal right ventricular size and function. Left Atrium: Severe left atrial enlargement. Right Atrium: Mild right atrial enlargement. Mitral Valve: Normal mitral valve structure and function. Mild mitral valve  regurgitation. Tricuspid Valve: There is mild tricuspid regurgitation. Mildly elevated estimated right ventricular systolic pressure. Aortic Valve: Severe calcification of the aortic valve. Severe calcification of the aortic valve. Severe aortic valve stenosis. Mild aortic valve regurgitation. Pulmonic Valve: Trace of pulmonic valve regurgitation. Aorta: There is mild aortic root dilatation. Pulmonary Artery: The pulmonary artery is not well visualized. Pericardium: Normal pericardium. Diastolic Function: Analysis of mitral valve inflow, pulmonary vein Doppler and tissue Doppler suggests grade Ia diastolic dysfunction with elevated left atrial pressure. Other: Inferior vena cava demonstrates a <50% collapse with respiration. Electronically signed Tim Campbell 07/02/13 6:44 AM STUDY DATA Patient Name: Tim Campbell Referring Physician: Brown Campbell. Tim Julian, Campbell Interpreting Physician: Tim Campbell Sonographer: Tim Campbell, RDCS Indication: [id number] MRN: echo DOB: Jan 09, 1926 Gender: M Date of Service: 06/26/2013 Height: 70.0 in Weight: 228 lb 2D Measurements RVIDd: 2.93 (1.9-2.6) cm LVIDd: 5.99 (4.2-5.9) cm LVIDs: 4.75 (2.1-4.0) cm IVSDd: 1.6 (0.6-1.0) cm LVPWd: 1.49 (0.6-1.0) cm Aortic Root: 4.3 (2.0-3.7) cm LA/AR Ratio: 1.37 EF: 41.3 (>=55) % FS: 20.7 (25-43) % SV: 74 (70-100) ml EDV: 179 (67-155) ml ESV: 105 (22-58) ml LA: 5.9 (3.0-4.0) cm M-MODE Measurements Mitral Valve Mitral VTI: 49.6 cm Peak E: 1 (0.6-1.3) m/s MV Mean Vel: 0.7 m/s Peak A: 0.94 (<=.7) m/s E/A Ratio: 1.1 (.75-1.5) e' lateral: 8.97 (>=10) cm/s e' Medial: 5.46 (>=10) cm/s E/e' Lateral: 11.1 (<=8) E/e' Medial: 18.3 (<=8) PHT: 110 ms MVA by PHT: 2 (4-6) cm2 DS: 324 cm/s2 DT: 275 (<=200) ms Tricuspid Valve TR Peak Vel: 2.28 m/s TR Peak Grad: 21 mmHg RAP: 15 (5-10) mmHg RVSP: 36 (<=30) mmHg Aortic Valve Peak: 4.08 (<=2.5) m/s Peak Grad: 67 (<=16) mmHg Mean: 2.86 m/s Mean Grad:  37 (<=5) mmHg AVA(vel): 0.79 (3-5) cm2 AVA(vti): 0.81 (3-5) cm2 AI PHT: 560 ms LVOT Diam: 2.1 (1.8-2.4) cm LVOT PV: 0.93 (0.7-1.1) m/s LVOT PG:  3 mmHg LVOT(vti): 24.5 (18-22) cm LVOT SV: 85 (70-100) ml Pulmonic Valve Report for [facility] Campbell echo on 06/26/13     [address] [place], [place]  [address] Phone: [phone] Fax:  [phone]  Date:  12/08/2013   ID:  Tim Campbell, DOB [birth year], MRN [id number]  PCP:  Tim Gambler, DO   ASSESSMENT:  1. Severe aortic stenosis as documented by the echocardiogram above performed at [facility] in September 2014. Patient's functional status is class III 2. Coronary artery disease with recent non-ST elevation myocardial infarction do to bypass graft occlusive disease. Last catheterization performed in March 2014 3. Hypertension 4. Chronic diastolic heart failure, stable 5. History of large cell non-Hodgkin's lymphoma  PLAN:  1. Referred to the [facility] 2. Will need further workup because CURRENT data above is older than 6 months. I decided not to repeat the data until he has had further recommendation about his treatment options. 6 months ago when I talked about referral, he refused. 3. No change in current medical regimen  SUBJECTIVE: Tim Campbell is a 80 y.o. male who brings in an article from the [facility] and Record about percutaneous heart valve replacement at [facility]. He has class III exertional symptoms that have been stable for the past 6 months. In September of 2014 we documented severe aortic stenosis with a transvalvular velocity of 4.1 m/s (see above). He also had a non-ST elevation myocardial infarction in early 2014 that is do to bypass graft failure. Please see the cath report summarized above. Since September he is been stable. He denies palpitations, syncope, angina pattern change, and has chronic exertional dyspnea also stable. Lower extremity  edema has resolved. Nitroglycerin relieves chest discomfort when it occurs.   Wt Readings from Last 3 Encounters:  12/08/13 231 lb (104.781 kg)  09/09/13 229 lb 12.8 oz (104.237 kg)  01/02/13 224 lb 3.3 oz (101.7 kg)     Past Medical History  Diagnosis Date  . Coronary artery disease     with occluded SVG to diagonal and BMS to prox SVG of RCA 10/2010. Recth 12/2012, 70% ISR SVG to RCA, 50% SVG to Cfx, 100% SVG to diagonal, and Patent LIMA with diffuse LAD dz.  . Hypertension   . Non-ST elevation MI (NSTEMI)   . Hyperlipidemia   . Insomnia   . Heart murmur   . Vitamin D deficiency   . Aortic stenosis   . Hypothyroidism   . RBBB   . Diverticulosis     Current Outpatient Prescriptions  Medication Sig Dispense Refill  . aspirin 81 MG tablet Take 81 mg by mouth daily.      Marland Kitchen atorvastatin (LIPITOR) 40 MG tablet Take 1 tablet (40 mg total) by mouth daily at 6 PM.  30 tablet  11  . clopidogrel (PLAVIX) 75 MG tablet Take 1 tablet (75 mg total) by mouth daily with breakfast.  30 tablet  11  . Cyanocobalamin (VITAMIN B 12 PO) Take 1 tablet by mouth daily.      Marland Kitchen GARLIC PO Take 1 tablet by mouth every other day.       . hydrochlorothiazide (MICROZIDE) 12.5 MG capsule Take 1 capsule (12.5 mg total) by mouth daily.  30 capsule  11  . isosorbide mononitrate (IMDUR) 60 MG 24 hr tablet Take 1 tablet (60 mg total) by mouth daily.  30 tablet  11  . levothyroxine (SYNTHROID, LEVOTHROID) 112 MCG tablet Take  112 mcg by mouth daily.      . metoprolol tartrate (LOPRESSOR) 25 MG tablet Take 1 tablet (25 mg total) by mouth 2 (two) times daily.  60 tablet  11  . NIACIN CR PO Take 1 tablet by mouth 2 (two) times daily.      . nitroGLYCERIN (NITROSTAT) 0.4 MG SL tablet Place 1 tablet (0.4 mg total) under the tongue every 5 (five) minutes x 3 doses as needed for chest pain.  25 tablet  5  . OVER THE COUNTER MEDICATION Take 1 tablet by mouth daily. Prostate essentials supplement       . SELENIUM PO Take 1  tablet by mouth daily.        Marland Kitchen VITAMIN D, ERGOCALCIFEROL, PO Take 1 tablet by mouth daily. Pt takes 3-4 times weekly       No current facility-administered medications for this visit.    Allergies:   No Known Allergies  Social History:  The patient  reports that he has quit smoking. He does not have any smokeless tobacco history on file. He reports that he does not drink alcohol.   ROS:  Please see the history of present illness.   Appetite is stable. Weight is been stable.   All other systems reviewed and negative.   OBJECTIVE: VS:  BP 104/68  Pulse 60  Ht 5\' 10"  (1.778 m)  Wt 231 lb (104.781 kg)  BMI 33.15 kg/m2 Well nourished, well developed, in no acute distress, appears his stated age or younger HEENT: normal Neck: JVD flat. Carotid bruit transmitted from the aortic valve  Cardiac:  normal S1, S2; RRR; 3-4/6 crescendo decrescendo murmur of aortic stenosis; no diastolic murmurs heard. Lungs:  clear to auscultation bilaterally, no wheezing, rhonchi or rales Abd: soft, nontender, no hepatomegaly Ext: Edema bilateral trace in the lower extremities. Pulses 2+ Skin: warm and dry Neuro:  CNs 2-12 intact, no focal abnormalities noted  EKG:  Not repeated       Signed, Illene Labrador III, Campbell 12/08/2013 12:30 PM

## 2013-12-08 NOTE — Patient Instructions (Signed)
Your physician recommends that you continue on your current medications as directed. Please refer to the Current Medication list given to you today.  You have been referred to [facility]   Please call the office if you are experiencing the following symptoms: Chest pain/pressure, shortness of breath, syncope(passing out)    Your physician wants you to follow-up in: 6 months You will receive a reminder letter in the mail two months in advance. If you don't receive a letter, please call our office to schedule the follow-up appointment.

## 2013-12-16 ENCOUNTER — Institutional Professional Consult (permissible substitution) (INDEPENDENT_AMBULATORY_CARE_PROVIDER_SITE_OTHER): Payer: Medicare Other | Admitting: Surgery

## 2013-12-16 ENCOUNTER — Encounter: Payer: Self-pay | Admitting: Surgery

## 2013-12-16 VITALS — BP 87/50 | HR 50 | Resp 20 | Ht 70.0 in | Wt 231.0 lb

## 2013-12-16 DIAGNOSIS — I359 Nonrheumatic aortic valve disorder, unspecified: Secondary | ICD-10-CM

## 2013-12-16 DIAGNOSIS — H903 Sensorineural hearing loss, bilateral: Secondary | ICD-10-CM | POA: Diagnosis not present

## 2013-12-16 DIAGNOSIS — I350 Nonrheumatic aortic (valve) stenosis: Secondary | ICD-10-CM

## 2013-12-19 ENCOUNTER — Encounter: Payer: Self-pay | Admitting: Surgery

## 2013-12-19 NOTE — Progress Notes (Signed)
[name]    CARDIOTHORACIC SURGERY CONSULTATION REPORT  Referring Provider is [name], MD PCP is [name], MD  Chief Complaint  Patient presents with  . Aortic Stenosis    Surgical eval for TAVR, ECHO 06/2013, Cath 12/2012    HPI:  The patient is an 80 year old gentleman who underwent coronary bypass graft surgery x 5 by [name] in 1993. He did well until 2005 when he had recurrent chest pain and ruled in for a NSTEMI. Cath showed recent occlusion of the diagonal vein graft with patent vein grafts to the OM and RCA and a patent LIMA to the LAD. He has subsequently had a BMS to the proximal SVG to the RCA in 10/2010. His last cath was in March 2014 which showed a 70% ISR of the SVG to the RCA, 50% proximal stenosis of the SVG to the OM, chronic occlusion of the SVG to the Diagonal, and a patent LIMA to the LAD which was diffusely diseased. His last echo in September 2014 showed severe AS with a peak velocity of 4.08 m/s, a mean aortic valve gradient of 37 mm Hg, and an AVA of 0.8 cm2. There was mild AI and mild MR with an LVEF of 45-50%.   He reports chest discomfort and shortness of breath with mild to moderate activity such as walking up hills or stairs, bending over, or picking up moderately heavy objects. He has had some lower extremity edema but no dizziness or syncope. He lives at home with his wife and tries to be active, likes to get out in his garden, but has been increasingly limited by his symptoms.  Past Medical History  Diagnosis Date  . Coronary artery disease     with occluded SVG to diagonal and BMS to prox SVG of RCA 10/2010. Recth 12/2012, 70% ISR SVG to RCA, 50% SVG to Cfx, 100% SVG to diagonal, and Patent LIMA with diffuse LAD dz.  . Hypertension   . Non-ST elevation MI (NSTEMI)   . Hyperlipidemia   . Insomnia   . Heart murmur   . Vitamin D deficiency   . Aortic stenosis   .  Hypothyroidism   . RBBB   . Diverticulosis     Past Surgical History  Procedure Laterality Date  . Ankle surgery    . Coronary artery bypass graft    . Hemorrhoid surgery    . Right inguinal hernia repair      Family History  Problem Relation Age of Onset  . Colon cancer Father   . Stroke Mother   . CAD Brother     History   Social History  . Marital Status: Married    Spouse Name: N/A    Number of Children: N/A  . Years of Education: N/A   Occupational History  . Not on file.   Social History Main Topics  . Smoking status: Former Research scientist (life sciences)  . Smokeless tobacco: Not on file  . Alcohol Use: No  . Drug Use: Not on file  . Sexual Activity: Not on file   Other Topics Concern  . Not on file   Social History Narrative  . No narrative on file    Current Outpatient Prescriptions  Medication Sig Dispense Refill  . aspirin 81 MG tablet Take 81 mg by mouth daily.      Marland Kitchen atorvastatin (LIPITOR) 40 MG tablet Take 1 tablet (40 mg total) by mouth daily  at 6 PM.  30 tablet  11  . clopidogrel (PLAVIX) 75 MG tablet Take 1 tablet (75 mg total) by mouth daily with breakfast.  30 tablet  11  . Cyanocobalamin (VITAMIN B 12 PO) Take 1 tablet by mouth daily.      Marland Kitchen GARLIC PO Take 1 tablet by mouth every other day.       . hydrochlorothiazide (MICROZIDE) 12.5 MG capsule Take 1 capsule (12.5 mg total) by mouth daily.  30 capsule  11  . isosorbide mononitrate (IMDUR) 60 MG 24 hr tablet Take 1 tablet (60 mg total) by mouth daily.  30 tablet  11  . levothyroxine (SYNTHROID, LEVOTHROID) 112 MCG tablet Take 112 mcg by mouth daily.      . metoprolol tartrate (LOPRESSOR) 25 MG tablet Take 1 tablet (25 mg total) by mouth 2 (two) times daily.  60 tablet  11  . NIACIN CR PO Take 1 tablet by mouth 2 (two) times daily.      . nitroGLYCERIN (NITROSTAT) 0.4 MG SL tablet Place 1 tablet (0.4 mg total) under the tongue every 5 (five) minutes x 3 doses as needed for chest pain.  25 tablet  5  . OVER THE  COUNTER MEDICATION Take 1 tablet by mouth daily. Prostate essentials supplement       . SELENIUM PO Take 1 tablet by mouth daily.        Marland Kitchen VITAMIN D, ERGOCALCIFEROL, PO Take 1 tablet by mouth daily. Pt takes 3-4 times weekly       No current facility-administered medications for this visit.    No Known Allergies    Review of Systems:   General:  good appetite, decreased energy, no weight gain, no weight loss, no fever  Cardiac:  has chest pain with exertion, no chest pain at rest, has SOB with moderate exertion, no resting SOB, no  PND, no orthopnea, no palpitations, no arrhythmia, no atrial fibrillation, has LE edema, no dizzy spells, no syncope  Respiratory:  has shortness of breath, no home oxygen, no productive cough, no dry cough, no bronchitis, no wheezing, no hemoptysis, no asthma, no pain with inspiration or cough, no sleep apnea, no CPAP at night  GI:   no difficulty swallowing, no reflux, no frequent heartburn, no hiatal hernia, no abdominal pain, no constipation, no diarrhea, no hematochezia, no hematemesis, no melena  GU:   no dysuria,  has frequency, no urinary tract infection, no hematuria, has enlarged prostate, no kidney stones, no kidney disease  Vascular:  no pain suggestive of claudication, no pain in feet, mild leg cramps, no varicose veins, no DVT, no non-healing foot ulcer  Neuro:   no stroke, no TIA's, no seizures, no headaches, notemporary blindness one eye,  no slurred speech, mild peripheral neuropathy, no chronic pain, no instability of gait, no memory/cognitive dysfunction  Musculoskeletal: mild arthritis, no joint swelling, no myalgias, no difficulty walking, no mobility   Skin:   no rash, no itching, no skin infections, no pressure sores or ulcerations  Psych:   no anxiety, no depression, no nervousness, no unusual recent stress  Eyes:   no blurry vision, no floaters, no recent vision changes, no wears glasses or contacts  ENT:   has hearing loss, no loose or  painful teeth, wears dentures,   Hematologic:  no easy bruising, no abnormal bleeding, no clotting disorder, no frequent epistaxis  Endocrine:  no diabetes, does not check CBG's at home        Physical  Exam:   BP 87/50  Pulse 50  Resp 20  Ht 5\' 10"  (1.778 m)  Wt 231 lb (104.781 kg)  BMI 33.15 kg/m2  SpO2 98%  General:             well-appearing elderly gentleman in no distress  HEENT:  Unremarkable   Neck:   no JVD, no bruits, no adenopathy or thyromegaly  Chest:   clear to auscultation, symmetrical breath sounds, no wheezes, no rhonchi   CV:   RRR, grade III/VI crescendo/decrescendo murmur heard best at RSB,  no diastolic murmur  Abdomen:  soft, non-tender, no masses or organomegaly  Extremities:  warm, well-perfused, pulses palpable in feet, no LE edema  Rectal/GU  Deferred  Neuro:   Grossly non-focal and symmetrical throughout  Skin:   Clean and dry, no rashes, no breakdown   Diagnostic Tests:  ECHOCARDIOGRAM 06/2013   Conclusions: 1. Severe aortic valve stenosis.  2. Mild aortic valve regurgitation.  3. Mild mitral valve regurgitation.  4. Severe left atrial enlargement.  5. Left ventricular ejection fraction estimated by 2D at 45-50 percent.  6. There is moderate concentric left ventricle hypertrophy.  7. Mid inferior wall hypokinesis.  8. There is mild tricuspid regurgitation.  9. Mildly elevated estimated right ventricular systolic pressure.  10. Mild right atrial enlargement.  11. Analysis of mitral valve inflow, pulmonary vein Doppler and tissue Doppler suggests grade Ia  diastolic dysfunction with elevated left atrial pressure.  Findings:  Left Ventricle: Mildly reduced left ventricular function.  Left ventricular ejection fraction estimated by 2D at 45-50 percent.  There is moderate concentric left ventricle hypertrophy.  Mid inferior wall hypokinesis.  Right Ventricle: Normal RV size.  Normal right ventricular size and function.  Left Atrium: Severe left  atrial enlargement.  Right Atrium: Mild right atrial enlargement.  Mitral Valve: Normal mitral valve structure and function.  Mild mitral valve regurgitation.  Tricuspid Valve: There is mild tricuspid regurgitation.  Mildly elevated estimated right ventricular systolic pressure.  Aortic Valve: Severe calcification of the aortic valve.  Severe calcification of the aortic valve.  Severe aortic valve stenosis.  Mild aortic valve regurgitation.  Pulmonic Valve: Trace of pulmonic valve regurgitation.  Aorta: There is mild aortic root dilatation.  Pulmonary Artery: The pulmonary artery is not well visualized.  Pericardium: Normal pericardium.  Diastolic Function: Analysis of mitral valve inflow, pulmonary vein Doppler and tissue Doppler suggests grade Ia diastolic  dysfunction with elevated left atrial pressure.  Other: Inferior vena cava demonstrates a <50% collapse with respiration.  Electronically signed Brown Human. Tamala Julian, MD 07/02/13 6:44 AM  STUDY DATA  Patient Name: Tim Campbell  Referring Physician: Brown Human. Tamala Julian, MD  Interpreting Physician: Brown Human. Tamala Julian, MD  Sonographer: Blondell Reveal, RDCS  Indication: 424.1  MRN: echo  DOB: 1925/12/31  Gender: M  Date of Service: 06/26/2013  Height: 70.0 in  Weight: 228 lb  2D Measurements  RVIDd: 2.93 (1.9-2.6) cm  LVIDd: 5.99 (4.2-5.9) cm  LVIDs: 4.75 (2.1-4.0) cm  IVSDd: 1.6 (0.6-1.0) cm  LVPWd: 1.49 (0.6-1.0) cm  Aortic Root: 4.3 (2.0-3.7) cm  LA/AR Ratio: 1.37  EF: 41.3 (>=55) %  FS: 20.7 (25-43) %  SV: 74 (70-100) ml  EDV: 179 (67-155) ml  ESV: 105 (22-58) ml  LA: 5.9 (3.0-4.0) cm  M-MODE Measurements  Mitral Valve  Mitral VTI: 49.6 cm  Peak E: 1 (0.6-1.3) m/s  MV Mean Vel: 0.7 m/s  Peak A: 0.94 (<=.7) m/s  E/A Ratio: 1.1 (.75-1.5)  e'  lateral: 8.97 (>=10) cm/s  e' Medial: 5.46 (>=10) cm/s  E/e' Lateral: 11.1 (<=8)  E/e' Medial: 18.3 (<=8)  PHT: 110 ms  MVA by PHT: 2 (4-6) cm2  DS: 324 cm/s2  DT: 275 (<=200)  ms  Tricuspid Valve  TR Peak Vel: 2.28 m/s  TR Peak Grad: 21 mmHg  RAP: 15 (5-10) mmHg  RVSP: 36 (<=30) mmHg  Aortic Valve  Peak: 4.08 (<=2.5) m/s  Peak Grad: 67 (<=16) mmHg  Mean: 2.86 m/s  Mean Grad: 37 (<=5) mmHg  AVA(vel): 0.79 (3-5) cm2  AVA(vti): 0.81 (3-5) cm2  AI PHT: 560 ms  LVOT Diam: 2.1 (1.8-2.4) cm  LVOT PV: 0.93 (0.7-1.1) m/s  LVOT PG: 3 mmHg  LVOT(vti): 24.5 (18-22) cm  LVOT SV: 85 (70-100) ml   Diagnostic Cardiac Catheterization with Bypass Graft Angiography Report  Elisah Helling Klees  79 y.o.  male  02-04-1926  Procedure Date: 01/02/2013  Referring Physician: Johnella Moloney  Primary Cardiologist: HWB Tim Campbell, III, MD  PROCEDURE: Left heart catheterization with selective coronary angiography, left ventriculogram.  INDICATIONS: Acute coronary syndrome with prolonged chest pain and mild elevation of troponin ischemic markers. Long-standing history of coronary artery disease with bypass grafting in 1993. Stent implantation in the ostium of the right coronary 2012. In 2012 we also noted that the saphenous vein graft to the diagonal is totally occluded. A recent is visited identified angina but the patient refused therapy and opted to attempt to lose weight and improve his diet. He refused medical therapy.  The risks, benefits, and details of the procedure were explained to the patient. The patient verbalized understanding and wanted to proceed. Informed written consent was obtained.  PROCEDURE TECHNIQUE: After Xylocaine anesthesia a 5 French sheath was placed in the right femoral artery with a single anterior needle wall stick. Coronary angiography was done using a 5 [place] #4 Judkins right, 5 [place] #4 left Judkins, and 5 [place] internal mammary artery catheter. Left ventriculography was done using a 5 [place] JR 4 Catheter by hand injection.  CONTRAST: Total of 90 cc.  COMPLICATIONS: None.  HEMODYNAMICS: Aortic pressure was 112/67 mmHg ; LV pressure was 130/7 ; LVEDP 14  mmHg. There was no gradient between the left ventricle and aorta.  ANGIOGRAPHIC DATA: The left main coronary artery is heavily calcified but patent. .  The left anterior descending artery is diffusely diseased and totally occluded proximally..  The left circumflex artery is totally occluded proximally with a diffusely diseased segment before the total occlusion .  The right coronary artery is totally occluded in the mid vessel .  BYPASS GRAFTS ANGIOGRAPHY:  The saphenous vein graft to the diagonal: Totally occluded  The saphenous vein graft to the obtuse marginal: 50% ostial narrowing  The saphenous vein graft sequential to the PDA and distal circumflex: 70-75% ostial ISR. The side-to-side anastomosis with the PDA is 99% obstructed the PDA is diffusely diseased. This would represent a progression since the prior study. The distal anastomosis to the circumflex was widely patent.  Internal mammary graft to LAD: 30-40% ostial narrowing. The LAD distal to the graft insertion is diffusely disease with segmental 90% stenoses noted the apex representing progression from prior studies.  LEFT VENTRICULOGRAM: Left ventricular angiogram was done in the 30 RAO projection and revealed regional wall motion abnormality and mild systolic dysfunction with an estimated ejection fraction of 45-50%.  IMPRESSIONS: 1. Bypass graft occlusive disease with occlusion of the saphenous vein graft to the diagonal, 50% ostial narrowing in the saphenous  vein graft to the distal circumflex, and 70% ISR in the saphenous vein graft sequential to the PDA and distal circumflex. The side-to-side anastomosis with the PDA is threatened at may be the source of the patient's non-ST elevation MI. This region is not amenable to PCI.  2. Severe native vessel disease with occlusion of the LAD, circumflex, and right coronary  3. Patent LIMA to the LAD but with diffuse distal LAD disease .  4. Mild left ventricular dysfunction, EF 45 .    RECOMMENDATION: 1. Institute medical therapy  2. Progressive symptoms on medical therapy would consider stenting the ostium of the right coronary graft. Intervention on the distal LAD is not feasible.   STS Risk Calculator for Redo CABG and AVR  Risk of Mortality: 5.025% Morbidity or Mortality: 27.933% Long Length of Stay: 11.908% Short Length of Stay: 17.808% Permanent Stroke: 2.629% Prolonged Ventilation: 17.202% DSW Infection: 0.62% Renal Failure: 8.519% Reoperation: 11.401%   Impression:  He has severe symptomatic aortic stenosis with class III congestive heart failure/anginal symptoms as well as know severe 3-vessel coronary artery disease and vein graft disease with an occluded SVG to the diagonal, an ISR in the RCA graft and at least 50% proximal stenosis in the OM vein graft by cath 1 year ago. I suspect his symptoms are due to a combination of ischemia and AS.  His STS risk for traditional open AVR and CABG redo is 5%. I think this is underestimated in this 80 year old gentleman and I think TAVR would be a reasonable alternative to open surgery. I reviewed his previous cardiac cath and echo studies and discussed the results with him and his family. I discussed the intensive workup for consideration of TAVR and that he would have to be evaluated by Dr. Roxy Manns and Dr. Burt Knack before deciding if he would be a candidate. Then he would need a repeat echocardiogram, cardiac cath, gated cardiac CT, and CT of the chest, abdomen and pelvis to complete the workup.  I spent over 60 minutes face to face with the patient and his family discussing his diagnoses, options for treatment including open surgery, TAVR and continued medical treatment. All of their questions have been answered. They were given literature about TAVR. At this point he is not sure what he wants to do but would like to go home and think about it.    Plan:  He will let us know if he wants to continue evaluation for possible TAVR.  If he does then I think he should be evaluated by Dr. Roxy Manns and Dr. Burt Knack to decide if he is an acceptable candidate for TAVR before proceeding with all of the diagnostic studies outlined above.    Gaye Pollack, MD 12/16/2013

## 2014-01-01 DIAGNOSIS — E039 Hypothyroidism, unspecified: Secondary | ICD-10-CM | POA: Diagnosis not present

## 2014-01-04 DIAGNOSIS — H3530 Unspecified macular degeneration: Secondary | ICD-10-CM | POA: Diagnosis not present

## 2014-01-07 ENCOUNTER — Other Ambulatory Visit: Payer: Self-pay

## 2014-01-07 MED ORDER — CLOPIDOGREL BISULFATE 75 MG PO TABS: 75.0000 mg | ORAL_TABLET | Freq: Every day | ORAL | Status: DC

## 2014-01-11 DIAGNOSIS — H903 Sensorineural hearing loss, bilateral: Secondary | ICD-10-CM | POA: Diagnosis not present

## 2014-01-13 ENCOUNTER — Other Ambulatory Visit: Payer: Self-pay

## 2014-01-13 MED ORDER — METOPROLOL TARTRATE 25 MG PO TABS: 25.0000 mg | ORAL_TABLET | Freq: Two times a day (BID) | ORAL | Status: DC

## 2014-01-13 MED ORDER — ATORVASTATIN CALCIUM 40 MG PO TABS: 40.0000 mg | ORAL_TABLET | Freq: Every day | ORAL | Status: DC

## 2014-01-13 MED ORDER — ISOSORBIDE MONONITRATE ER 60 MG PO TB24: 60.0000 mg | ORAL_TABLET | Freq: Every day | ORAL | Status: DC

## 2014-03-29 DIAGNOSIS — I2510 Atherosclerotic heart disease of native coronary artery without angina pectoris: Secondary | ICD-10-CM | POA: Diagnosis not present

## 2014-03-29 DIAGNOSIS — Z0000 Encounter for general adult medical examination without abnormal findings: Secondary | ICD-10-CM | POA: Diagnosis not present

## 2014-03-29 DIAGNOSIS — E039 Hypothyroidism, unspecified: Secondary | ICD-10-CM | POA: Diagnosis not present

## 2014-03-29 DIAGNOSIS — I10 Essential (primary) hypertension: Secondary | ICD-10-CM | POA: Diagnosis not present

## 2014-03-29 DIAGNOSIS — E538 Deficiency of other specified B group vitamins: Secondary | ICD-10-CM | POA: Diagnosis not present

## 2014-03-29 DIAGNOSIS — Z1331 Encounter for screening for depression: Secondary | ICD-10-CM | POA: Diagnosis not present

## 2014-03-29 DIAGNOSIS — I359 Nonrheumatic aortic valve disorder, unspecified: Secondary | ICD-10-CM | POA: Diagnosis not present

## 2014-03-29 DIAGNOSIS — E782 Mixed hyperlipidemia: Secondary | ICD-10-CM | POA: Diagnosis not present

## 2014-03-29 DIAGNOSIS — E559 Vitamin D deficiency, unspecified: Secondary | ICD-10-CM | POA: Diagnosis not present

## 2014-04-01 ENCOUNTER — Encounter: Payer: Self-pay | Admitting: Cardiovascular Disease

## 2014-04-01 ENCOUNTER — Ambulatory Visit (INDEPENDENT_AMBULATORY_CARE_PROVIDER_SITE_OTHER): Payer: Medicare Other | Admitting: Cardiovascular Disease

## 2014-04-01 ENCOUNTER — Encounter: Payer: Self-pay | Admitting: *Deleted

## 2014-04-01 VITALS — BP 108/50 | HR 60 | Ht 70.0 in | Wt 227.0 lb

## 2014-04-01 DIAGNOSIS — I25810 Atherosclerosis of coronary artery bypass graft(s) without angina pectoris: Secondary | ICD-10-CM | POA: Diagnosis not present

## 2014-04-01 DIAGNOSIS — I359 Nonrheumatic aortic valve disorder, unspecified: Secondary | ICD-10-CM

## 2014-04-01 LAB — CBC WITH DIFFERENTIAL/PLATELET
BASOS PCT: 0.7 % (ref 0.0–3.0)
Basophils Absolute: 0.1 10*3/uL (ref 0.0–0.1)
EOS PCT: 3.2 % (ref 0.0–5.0)
Eosinophils Absolute: 0.2 10*3/uL (ref 0.0–0.7)
HEMATOCRIT: 40.6 % (ref 39.0–52.0)
Hemoglobin: 13.3 g/dL (ref 13.0–17.0)
LYMPHS ABS: 2.5 10*3/uL (ref 0.7–4.0)
Lymphocytes Relative: 34.1 % (ref 12.0–46.0)
MCHC: 32.8 g/dL (ref 30.0–36.0)
MCV: 85.5 fl (ref 78.0–100.0)
MONO ABS: 0.6 10*3/uL (ref 0.1–1.0)
MONOS PCT: 8.9 % (ref 3.0–12.0)
Neutro Abs: 3.8 10*3/uL (ref 1.4–7.7)
Neutrophils Relative %: 53.1 % (ref 43.0–77.0)
Platelets: 184 10*3/uL (ref 150.0–400.0)
RBC: 4.75 Mil/uL (ref 4.22–5.81)
RDW: 15.7 % — ABNORMAL HIGH (ref 11.5–15.5)
WBC: 7.2 10*3/uL (ref 4.0–10.5)

## 2014-04-01 LAB — BASIC METABOLIC PANEL
BUN: 24 mg/dL — ABNORMAL HIGH (ref 6–23)
CHLORIDE: 103 meq/L (ref 96–112)
CO2: 30 mEq/L (ref 19–32)
Calcium: 9.3 mg/dL (ref 8.4–10.5)
Creatinine, Ser: 1.1 mg/dL (ref 0.4–1.5)
GFR: 66.15 mL/min (ref >60.00)
Glucose, Bld: 95 mg/dL (ref 70–99)
POTASSIUM: 4.6 meq/L (ref 3.5–5.1)
SODIUM: 139 meq/L (ref 135–145)

## 2014-04-01 LAB — PROTIME-INR
INR: 1.1 ratio — AB (ref 0.8–1.0)
PROTHROMBIN TIME: 12.6 s (ref 9.6–13.1)

## 2014-04-01 NOTE — Progress Notes (Signed)
[facility]   History of Present Illness: 80 yo male with history of CAD s/p CABG, HTN, Hyperlipidemia, hypothyroidism, large cell non-Hodgkins lymphoma and severe aortic valve stenosis here today for evaluation for TAVR. He underwent 5V CABG in 1993. He had a NSTEMI in 2005 and was found to have occlusion of the vein graft to the Diagonal. He has since had a bare metal stent placed in the vein graft to the PDA in January 2012. Grafts to the LAD and OM were patent at that time. Last cath in March 2014 showed a 70% in stent restenosis of the SVG to the RCA, 50% proximal stenosis of the SVG to the OM, chronic occlusion of the SVG to the Diagonal, and a patent LIMA to the LAD which was diffusely diseased. His last echo in September 2014 showed severe AS with a peak velocity of 4.08 m/s, a mean aortic valve gradient of 37 mm Hg, and an AVA of 0.8 cm2. There was mild AI and mild MR with an LVEF of 45-50%. He has been followed in our office by Dr. Pernell Dupre. He has been seen by Dr. Gilford Raid in the [place] surgery office 12/16/13.   He tells me today that he has been having chest pressure and shortness of breath with mild activity. The chest pressure is most worrisome to him. His symptoms are much worse when walking up hills or when lifting heavy objects. He mows his grass on a riding mower. He has been unable to work in his garden this summer without constant fatigue. He denies dizziness or syncope. Minimal lower extremity edema.   Primary Care Physician: [name]  Past Medical History  Diagnosis Date  . Coronary artery disease     with occluded SVG to diagonal and BMS to prox SVG of RCA 10/2010. Recth 12/2012, 70% ISR SVG to RCA, 50% SVG to Cfx, 100% SVG to diagonal, and Patent LIMA with diffuse LAD dz.  . Hypertension   . Non-ST elevation MI (NSTEMI)   . Hyperlipidemia   . Insomnia   .  Vitamin D deficiency   . Aortic stenosis     severe by echo September 2014  . Hypothyroidism   . RBBB   . Diverticulosis   . Non Hodgkin's lymphoma     Past Surgical History  Procedure Laterality Date  . Ankle surgery    . Coronary artery bypass graft  1993  . Hemorrhoid surgery    . Right inguinal hernia repair      Current Outpatient Prescriptions  Medication Sig Dispense Refill  . aspirin 81 MG tablet Take 81 mg by mouth daily.      Marland Kitchen atorvastatin (LIPITOR) 40 MG tablet Take 1 tablet (40 mg total) by mouth daily at 6 PM.  30 tablet  11  . clopidogrel (PLAVIX) 75 MG tablet Take 1 tablet (75 mg total) by mouth daily with breakfast.  30 tablet  11  . Cyanocobalamin (VITAMIN B 12 PO)  Take 1 tablet by mouth daily.      Marland Kitchen GARLIC PO Take 1 tablet by mouth every other day.       . hydrochlorothiazide (MICROZIDE) 12.5 MG capsule Take 1 capsule (12.5 mg total) by mouth daily.  30 capsule  11  . isosorbide mononitrate (IMDUR) 60 MG 24 hr tablet Take 1 tablet (60 mg total) by mouth daily.  30 tablet  11  . levothyroxine (SYNTHROID, LEVOTHROID) 112 MCG tablet Take 112 mcg by mouth daily.      . metoprolol tartrate (LOPRESSOR) 25 MG tablet Take 1 tablet (25 mg total) by mouth 2 (two) times daily.  60 tablet  11  . NIACIN CR PO Take 1 tablet by mouth 2 (two) times daily.      . nitroGLYCERIN (NITROSTAT) 0.4 MG SL tablet Place 1 tablet (0.4 mg total) under the tongue every 5 (five) minutes x 3 doses as needed for chest pain.  25 tablet  5  . OVER THE COUNTER MEDICATION Take 1 tablet by mouth daily. Prostate essentials supplement       . SELENIUM PO Take 1 tablet by mouth daily.        Marland Kitchen VITAMIN D, ERGOCALCIFEROL, PO Take 1 tablet by mouth daily. Pt takes 3-4 times weekly       No current facility-administered medications for this visit.    No Known Allergies  History   Social History  . Marital Status: Married    Spouse Name: N/A    Number of Children: 3  . Years of Education: N/A    Occupational History  . 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 Smoking status: Former Smoker -- 0.50 packs/day for 15 years    Types: Cigarettes  . Smokeless tobacco: Not on file  . Alcohol Use: No  . Drug Use: No  . Sexual Activity: Not on file   Other Topics Concern  . Not on file   Social History Narrative  . No narrative on file    Family History  Problem Relation Age of Onset  . Colon cancer Father   . Stroke Mother   . CAD Brother     Review of Systems:  As stated in the HPI and otherwise negative.   BP 108/50  Pulse 60  Ht 5\' 10"  (1.778 m)  Wt 227 lb (102.967 kg)  BMI 32.57 kg/m2  Physical Examination: General: Well developed, well nourished, NAD HEENT: OP clear, mucus membranes moist SKIN: warm, dry. No rashes. Neuro: No focal deficits Musculoskeletal: Muscle strength 5/5 all ext Psychiatric: Mood and affect normal Neck: No JVD, no carotid bruits, no thyromegaly, no lymphadenopathy. Lungs:Clear bilaterally, no wheezes, rhonci, crackles Cardiovascular: Regular rate and rhythm. Harsh systolic murmur. No gallops or rubs. Abdomen:Soft. Bowel sounds present. Non-tender.  Extremities: Trace  bilateral lower extremity edema. Pulses are 1 + in the bilateral DP/PT.  EKG: Sinus brady, rate 52 bpm. RBBB. Poor R wave progression through the pre-cordial leads. Non-specific ST abnormalities.   ECHOCARDIOGRAM 06/2013  Conclusions: 1. Severe aortic valve stenosis.  2. Mild aortic valve regurgitation.  3. Mild mitral valve regurgitation.  4. Severe left atrial enlargement.  5. Left ventricular ejection fraction estimated by 2D at 45-50 percent.  6. There is moderate concentric left ventricle hypertrophy.  7. Mid inferior wall hypokinesis.  8. There is mild tricuspid regurgitation.  9. Mildly elevated estimated right ventricular systolic pressure.  10. Mild right atrial enlargement.  11. Analysis of mitral  valve inflow, pulmonary vein  Doppler and tissue Doppler suggests grade Ia  diastolic dysfunction with elevated left atrial pressure.  Findings:  Left Ventricle: Mildly reduced left ventricular function.  Left ventricular ejection fraction estimated by 2D at 45-50 percent.  There is moderate concentric left ventricle hypertrophy.  Mid inferior wall hypokinesis.  Right Ventricle: Normal RV size.  Normal right ventricular size and function.  Left Atrium: Severe left atrial enlargement.  Right Atrium: Mild right atrial enlargement.  Mitral Valve: Normal mitral valve structure and function.  Mild mitral valve regurgitation.  Tricuspid Valve: There is mild tricuspid regurgitation.  Mildly elevated estimated right ventricular systolic pressure.  Aortic Valve: Severe calcification of the aortic valve.  Severe calcification of the aortic valve.  Severe aortic valve stenosis.  Mild aortic valve regurgitation.  Pulmonic Valve: Trace of pulmonic valve regurgitation.  Aorta: There is mild aortic root dilatation.  Pulmonary Artery: The pulmonary artery is not well visualized.  Pericardium: Normal pericardium.  Diastolic Function: Analysis of mitral valve inflow, pulmonary vein Doppler and tissue Doppler suggests grade Ia diastolic  dysfunction with elevated left atrial pressure.  Other: Inferior vena cava demonstrates a <50% collapse with respiration. 2D Measurements  RVIDd: 2.93 (1.9-2.6) cm  LVIDd: 5.99 (4.2-5.9) cm  LVIDs: 4.75 (2.1-4.0) cm  IVSDd: 1.6 (0.6-1.0) cm  LVPWd: 1.49 (0.6-1.0) cm  Aortic Root: 4.3 (2.0-3.7) cm  LA/AR Ratio: 1.37  EF: 41.3 (>=55) %  FS: 20.7 (25-43) %  SV: 74 (70-100) ml  EDV: 179 (67-155) ml  ESV: 105 (22-58) ml  LA: 5.9 (3.0-4.0) cm  M-MODE Measurements  Mitral Valve  Mitral VTI: 49.6 cm  Peak E: 1 (0.6-1.3) m/s  MV Mean Vel: 0.7 m/s  Peak A: 0.94 (<=.7) m/s  E/A Ratio: 1.1 (.75-1.5)  e' lateral: 8.97 (>=10) cm/s  e' Medial: 5.46 (>=10) cm/s  E/e' Lateral: 11.1 (<=8)  E/e'  Medial: 18.3 (<=8)  PHT: 110 ms  MVA by PHT: 2 (4-6) cm2  DS: 324 cm/s2  DT: 275 (<=200) ms  Tricuspid Valve  TR Peak Vel: 2.28 m/s  TR Peak Grad: 21 mmHg  RAP: 15 (5-10) mmHg  RVSP: 36 (<=30) mmHg  Aortic Valve  Peak: 4.08 (<=2.5) m/s  Peak Grad: 67 (<=16) mmHg  Mean: 2.86 m/s  Mean Grad: 37 (<=5) mmHg  AVA(vel): 0.79 (3-5) cm2  AVA(vti): 0.81 (3-5) cm2  AI PHT: 560 ms  LVOT Diam: 2.1 (1.8-2.4) cm  LVOT PV: 0.93 (0.7-1.1) m/s  LVOT PG: 3 mmHg  LVOT(vti): 24.5 (18-22) cm  LVOT SV: 85 (70-100) ml  ANGIOGRAPHIC DATA: The left main coronary artery is heavily calcified but patent. .  The left anterior descending artery is diffusely diseased and totally occluded proximally..  The left circumflex artery is totally occluded proximally with a diffusely diseased segment before the total occlusion .  The right coronary artery is totally occluded in the mid vessel .  BYPASS GRAFTS ANGIOGRAPHY:  The saphenous vein graft to the diagonal: Totally occluded  The saphenous vein graft to the obtuse marginal: 50% ostial narrowing  The saphenous vein graft sequential to the PDA and distal circumflex: 70-75% ostial ISR. The side-to-side anastomosis with the PDA is 99% obstructed the PDA is diffusely diseased. This would represent a progression since the prior study. The distal anastomosis to the circumflex was widely patent.  Internal mammary graft to LAD: 30-40% ostial narrowing. The LAD distal to the graft insertion is diffusely disease with segmental 90% stenoses noted the apex representing progression from  prior studies.  LEFT VENTRICULOGRAM: Left ventricular angiogram was done in the 30 RAO projection and revealed regional wall motion abnormality and mild systolic dysfunction with an estimated ejection fraction of 45-50%.  IMPRESSIONS: 1. Bypass graft occlusive disease with occlusion of the saphenous vein graft to the diagonal, 50% ostial narrowing in the saphenous vein graft to the distal  circumflex, and 70% ISR in the saphenous vein graft sequential to the PDA and distal circumflex. The side-to-side anastomosis with the PDA is threatened at may be the source of the patient's non-ST elevation MI. This region is not amenable to PCI.  2. Severe native vessel disease with occlusion of the LAD, circumflex, and right coronary  3. Patent LIMA to the LAD but with diffuse distal LAD disease .  4. Mild left ventricular dysfunction, EF 45 .  RECOMMENDATION: 1. Institute medical therapy  2. Progressive symptoms on medical therapy would consider stenting the ostium of the right coronary graft. Intervention on the distal LAD is not feasible.   STS Risk Calculator for Redo CABG and AVR  Risk of Mortality: 5.025%  Morbidity or Mortality: 27.933%  Long Length of Stay: 11.908%  Short Length of Stay: 17.808%  Permanent Stroke: 2.629%  Prolonged Ventilation: 17.202%  DSW Infection: 0.62%  Renal Failure: 8.519%  Reoperation: 11.401%   Assessment:   1. Severe aortic valve stenosis 2. Coronary artery disease s/p CABG 3. Ongoing NYHA class III symptoms with fatigue, exertional dyspnea and chest pressure, likely combination of #1 and #2 above.  Plan:   I have spent 60 minutes today discussing the etiology of his aortic valve disease and his obstructive coronary artery disease. He is having lifestyle limiting symptoms that are likely a combination of his advanced CAD and his severe aortic valve stenosis. As noted by Dr. Cyndia Bent, his STS risk score is 5% but given his advanced age and comorbidities, he is at high risk for traditional open surgical aortic valve replacement. I think that he is a good candidate for the TAVR procedure. He has now decided that he would like to proceed with the workup for TAVR. He is here today with his wife and daughter who are supportive of the plan. Will arrange right and left cardiac cath next week. Will arrange echocardiogram to evaluate LVEF and assess the aortic  valve. He will need to have a gated cardiac CT as well as CT of the chest/abdomen and pelvis. This will be arranged by our TAVR coordinator. Further planning for follow up with our surgical team to be arranged. Pre-cath labs today.

## 2014-04-01 NOTE — Patient Instructions (Signed)
Your physician has requested that you have a cardiac catheterization. Cardiac catheterization is used to diagnose and/or treat various heart conditions. Doctors may recommend this procedure for a number of different reasons. The most common reason is to evaluate chest pain. Chest pain can be a symptom of coronary artery disease (CAD), and cardiac catheterization can show whether plaque is narrowing or blocking your heart's arteries. This procedure is also used to evaluate the valves, as well as measure the blood flow and oxygen levels in different parts of your heart. For further information please visit [email or web address]. Please follow instruction sheet, as given. Scheduled for April 05, 2014   Your physician has requested that you have an echocardiogram. Echocardiography is a painless test that uses sound waves to create images of your heart. It provides your doctor with information about the size and shape of your heart and how well your heart's chambers and valves are working. This procedure takes approximately one hour. There are no restrictions for this procedure.

## 2014-04-02 ENCOUNTER — Encounter (HOSPITAL_COMMUNITY): Payer: Self-pay

## 2014-04-05 ENCOUNTER — Encounter (HOSPITAL_COMMUNITY)
Admission: RE | Disposition: A | Discharge status: Home / Self Care | Payer: Self-pay | Source: Ambulatory Visit | Attending: Cardiovascular Disease

## 2014-04-05 ENCOUNTER — Ambulatory Visit (HOSPITAL_COMMUNITY)
Admission: RE | Admit: 2014-04-05 | Discharge: 2014-04-05 | Disposition: A | Discharge status: Home / Self Care | Payer: Medicare Other | Source: Ambulatory Visit | Attending: Cardiovascular Disease | Admitting: Cardiovascular Disease

## 2014-04-05 DIAGNOSIS — I2582 Chronic total occlusion of coronary artery: Secondary | ICD-10-CM | POA: Diagnosis not present

## 2014-04-05 DIAGNOSIS — Z9861 Coronary angioplasty status: Secondary | ICD-10-CM | POA: Insufficient documentation

## 2014-04-05 DIAGNOSIS — C8589 Other specified types of non-Hodgkin lymphoma, extranodal and solid organ sites: Secondary | ICD-10-CM | POA: Diagnosis not present

## 2014-04-05 DIAGNOSIS — Z7902 Long term (current) use of antithrombotics/antiplatelets: Secondary | ICD-10-CM | POA: Insufficient documentation

## 2014-04-05 DIAGNOSIS — I359 Nonrheumatic aortic valve disorder, unspecified: Secondary | ICD-10-CM | POA: Diagnosis not present

## 2014-04-05 DIAGNOSIS — I10 Essential (primary) hypertension: Secondary | ICD-10-CM | POA: Insufficient documentation

## 2014-04-05 DIAGNOSIS — Z87891 Personal history of nicotine dependence: Secondary | ICD-10-CM | POA: Diagnosis not present

## 2014-04-05 DIAGNOSIS — E039 Hypothyroidism, unspecified: Secondary | ICD-10-CM | POA: Insufficient documentation

## 2014-04-05 DIAGNOSIS — G4700 Insomnia, unspecified: Secondary | ICD-10-CM | POA: Diagnosis not present

## 2014-04-05 DIAGNOSIS — I2510 Atherosclerotic heart disease of native coronary artery without angina pectoris: Secondary | ICD-10-CM | POA: Diagnosis not present

## 2014-04-05 DIAGNOSIS — I4510 Unspecified right bundle-branch block: Secondary | ICD-10-CM | POA: Diagnosis not present

## 2014-04-05 DIAGNOSIS — I252 Old myocardial infarction: Secondary | ICD-10-CM | POA: Diagnosis not present

## 2014-04-05 DIAGNOSIS — I25810 Atherosclerosis of coronary artery bypass graft(s) without angina pectoris: Secondary | ICD-10-CM | POA: Diagnosis not present

## 2014-04-05 DIAGNOSIS — E559 Vitamin D deficiency, unspecified: Secondary | ICD-10-CM | POA: Diagnosis not present

## 2014-04-05 DIAGNOSIS — E785 Hyperlipidemia, unspecified: Secondary | ICD-10-CM | POA: Insufficient documentation

## 2014-04-05 DIAGNOSIS — R4181 Age-related cognitive decline: Secondary | ICD-10-CM | POA: Insufficient documentation

## 2014-04-05 DIAGNOSIS — Z7982 Long term (current) use of aspirin: Secondary | ICD-10-CM | POA: Diagnosis not present

## 2014-04-05 HISTORY — PX: LEFT AND RIGHT HEART CATHETERIZATION WITH CORONARY/GRAFT ANGIOGRAM: SHX5448

## 2014-04-05 LAB — POCT I-STAT 3, VENOUS BLOOD GAS (G3P V)
Acid-Base Excess: 3 mmol/L — ABNORMAL HIGH (ref 0.0–2.0)
Bicarbonate: 29 mEq/L — ABNORMAL HIGH (ref 20.0–24.0)
O2 Saturation: 51 %
PH VEN: 7.361 — AB (ref 7.250–7.300)
TCO2: 31 mmol/L (ref 0–100)
pCO2, Ven: 51.2 mmHg — ABNORMAL HIGH (ref 45.0–50.0)
pO2, Ven: 29 mmHg — CL (ref 30.0–45.0)

## 2014-04-05 LAB — POCT I-STAT 3, ART BLOOD GAS (G3+)
ACID-BASE EXCESS: 1 mmol/L (ref 0.0–2.0)
Bicarbonate: 25.9 mEq/L — ABNORMAL HIGH (ref 20.0–24.0)
O2 SAT: 97 %
TCO2: 27 mmol/L (ref 0–100)
pCO2 arterial: 41.8 mmHg (ref 35.0–45.0)
pH, Arterial: 7.4 (ref 7.350–7.450)
pO2, Arterial: 94 mmHg (ref 80.0–100.0)

## 2014-04-05 SURGERY — LEFT AND RIGHT HEART CATHETERIZATION WITH CORONARY/GRAFT ANGIOGRAM
Anesthesia: LOCAL

## 2014-04-05 MED ORDER — SODIUM CHLORIDE 0.9 % IV SOLN
INTRAVENOUS | Status: DC
  Administered 2014-04-05: 08:00:00 via INTRAVENOUS

## 2014-04-05 MED ORDER — SODIUM CHLORIDE 0.9 % IJ SOLN: 3.0000 mL | Freq: Two times a day (BID) | INTRAMUSCULAR | Status: DC

## 2014-04-05 MED ORDER — SODIUM CHLORIDE 0.9 % IV SOLN: 250.0000 mL | INTRAVENOUS | Status: DC | PRN

## 2014-04-05 MED ORDER — ONDANSETRON HCL 4 MG/2ML IJ SOLN
INTRAMUSCULAR | Status: AC
  Filled 2014-04-05: qty 2

## 2014-04-05 MED ORDER — ASPIRIN 81 MG PO CHEW
81.0000 mg | CHEWABLE_TABLET | ORAL | Status: AC
  Administered 2014-04-05: 81 mg via ORAL
  Filled 2014-04-05: qty 1

## 2014-04-05 MED ORDER — MIDAZOLAM HCL 2 MG/2ML IJ SOLN
INTRAMUSCULAR | Status: AC
  Filled 2014-04-05: qty 2

## 2014-04-05 MED ORDER — FENTANYL CITRATE 0.05 MG/ML IJ SOLN
INTRAMUSCULAR | Status: AC
  Filled 2014-04-05: qty 2

## 2014-04-05 MED ORDER — SODIUM CHLORIDE 0.9 % IJ SOLN: 3.0000 mL | INTRAMUSCULAR | Status: DC | PRN

## 2014-04-05 MED ORDER — HEPARIN (PORCINE) IN NACL 2-0.9 UNIT/ML-% IJ SOLN
INTRAMUSCULAR | Status: AC
  Filled 2014-04-05: qty 1000

## 2014-04-05 MED ORDER — LIDOCAINE HCL (PF) 1 % IJ SOLN
INTRAMUSCULAR | Status: AC
  Filled 2014-04-05: qty 30

## 2014-04-05 MED ORDER — NITROGLYCERIN 0.2 MG/ML ON CALL CATH LAB
INTRAVENOUS | Status: AC
  Filled 2014-04-05: qty 1

## 2014-04-05 MED ORDER — SODIUM CHLORIDE 0.9 % IV SOLN: INTRAVENOUS | Status: AC

## 2014-04-05 NOTE — CV Procedure (Signed)
Cardiac Catheterization Operative Report  Tim Campbell [id number] 6/29/201510:01 AM GATES,ROBERT NEVILL, MD  Procedure Performed:  1. Left Heart Catheterization 2. Selective Coronary Angiography 3. Right Heart Catheterization 4. SVG angiography 5. LIMA graft angiography 6. Aortic root angiogram  Operator: Lauree Chandler, MD  Indication:  80 yo male with history of CAD s/p CABG, severe AS with recent exertional chest pain, dizziness. Right and left heart cath to assess patency of bypass grafts, assess aortic valve, assess pressures.                                 Procedure Details: The risks, benefits, complications, treatment options, and expected outcomes were discussed with the patient. The patient and/or family concurred with the proposed plan, giving informed consent. The patient was brought to the cath lab after IV hydration was begun and oral premedication was given. The patient was further sedated with Versed and Fentanyl. The right groin was prepped and draped in the usual manner. Using the modified Seldinger access technique, a 5 French sheath was placed in the right femoral artery. A 6 French sheath was inserted into the right femoral vein. A multi-purpose catheter was used to perform a right heart catheterization. Standard diagnostic catheters were used to perform selective coronary angiography. The JR4 catheter was used to engage all three vein grafts. A LIMA catheter was used to engage the LIMA graft. An AL-2 catheter was used with a straight wire to cross the aortic valve. I then measured LV pressures with a pigtail catheter. An aortic root angiogram was performed with a pigtail catheter. There were no immediate complications. The patient was taken to the recovery area in stable condition.   Hemodynamic Findings: Ao: 151/64               LV: 176/8/19 RA: 6               RV: 19/4/8 PA: 22/8 (mean 14)       PCWP: 6 Fick Cardiac Output: 3.47 L/min Fick  Cardiac Index: 1.6 L/min/m2 Central Aortic Saturation: 97% Pulmonary Artery Saturation: 51%  Aortic valve Data: Peak to peak gradient 25 mm Hg                              Mean gradient 20 mm Hg                              AVA: 1.23 cm2  Angiographic Findings:  Left main: Heavily calcified, patent with 20% distal stenosis.   Left Anterior Descending Artery: Large caliber vessel that courses to the apex. The vessel is occluded in the mid segment. The mid and distal vessel fills from the patent IMA graft. The vein graft to the Diagonal is occluded. (chronic)  Circumflex Artery: Moderate caliber vessel with diffuse 40% proximal stenosis followed by 100% mid occlusion. The obtuse marginal branch is occluded and fills from the patent vein graft. The left sided posterolateral branch fills from the patent vein graft.   Right Coronary Artery: Large dominant vessel with 100% mid occlusion. The distal vessel fills from the patent vein graft.   Graft Anatomy:  SVG to Diagonal is occluded SVG to Obtuse marginal is patent. The proximal body of the vein graft has a 90-95% stenosis.  SVG sequential to PDA and  left posterolateral branch is patent. The stented segment in the ostium/proximal body of vein graft is patent however there appears to be 70-80% restenosis in this stented segment. The PDA is diffusely diseased beyond the insertion of the vein graft and has poor flow (unchanged). The sequential arm of the graft to the posterolateral branch is patent.  LIMA to LAD is patent. The distal LAD has diffuse moderate disease.   Left Ventricular Angiogram: Deferred  Aortic root angiogram: No aneurysm noted.   Impression: 1. Severe triple vessel CAD s/p 5V CABG with 3/5 patent bypass grafts 2. Severe stenosis proximal body of SVG to OM 3. Moderately severe stenosis ostium/proximal stented segment of SVG to PDA/PLA 4. Moderately severe aortic valve stenosis  Recommendations: He is having exertional  chest pain c/w angina. His symptoms may be related to the progression of the disease in his vein grafts. I will review his films with Dr. Tamala Julian his primary cardiologist. I think his CAD should be addressed and if he has no improvement in symptoms, can consider TAVR at that time. He will be discharged today and we will plan to bring him back for PCI with Dr. Tamala Julian if he is in agreement with that plan.        Complications:  None; patient tolerated the procedure well.

## 2014-04-05 NOTE — H&P (View-Only) (Signed)
[facility]   History of Present Illness: 80 yo male with history of CAD s/p CABG, HTN, Hyperlipidemia, hypothyroidism, large cell non-Hodgkins lymphoma and severe aortic valve stenosis here today for evaluation for TAVR. He underwent 5V CABG in 1993. He had a NSTEMI in 2005 and was found to have occlusion of the vein graft to the Diagonal. He has since had a bare metal stent placed in the vein graft to the PDA in January 2012. Grafts to the LAD and OM were patent at that time. Last cath in March 2014 showed a 70% in stent restenosis of the SVG to the RCA, 50% proximal stenosis of the SVG to the OM, chronic occlusion of the SVG to the Diagonal, and a patent LIMA to the LAD which was diffusely diseased. His last echo in September 2014 showed severe AS with a peak velocity of 4.08 m/s, a mean aortic valve gradient of 37 mm Hg, and an AVA of 0.8 cm2. There was mild AI and mild MR with an LVEF of 45-50%. He has been followed in our office by Dr. Pernell Dupre. He has been seen by Dr. Gilford Raid in the [facility] office 12/16/13.   He tells me today that he has been having chest pressure and shortness of breath with mild activity. The chest pressure is most worrisome to him. His symptoms are much worse when walking up hills or when lifting heavy objects. He mows his grass on a riding mower. He has been unable to work in his garden this summer without constant fatigue. He denies dizziness or syncope. Minimal lower extremity edema.   Primary Care Physician: [name]  Past Medical History  Diagnosis Date  . Coronary artery disease     with occluded SVG to diagonal and BMS to prox SVG of RCA 10/2010. Recth 12/2012, 70% ISR SVG to RCA, 50% SVG to Cfx, 100% SVG to diagonal, and Patent LIMA with diffuse LAD dz.  . Hypertension   . Non-ST elevation MI (NSTEMI)   . Hyperlipidemia   . Insomnia   .  Vitamin D deficiency   . Aortic stenosis     severe by echo September 2014  . Hypothyroidism   . RBBB   . Diverticulosis   . Non Hodgkin's lymphoma     Past Surgical History  Procedure Laterality Date  . Ankle surgery    . Coronary artery bypass graft  1993  . Hemorrhoid surgery    . Right inguinal hernia repair      Current Outpatient Prescriptions  Medication Sig Dispense Refill  . aspirin 81 MG tablet Take 81 mg by mouth daily.      Marland Kitchen atorvastatin (LIPITOR) 40 MG tablet Take 1 tablet (40 mg total) by mouth daily at 6 PM.  30 tablet  11  . clopidogrel (PLAVIX) 75 MG tablet Take 1 tablet (75 mg total) by mouth daily with breakfast.  30 tablet  11  . Cyanocobalamin (VITAMIN B 12 PO)  Take 1 tablet by mouth daily.      Marland Kitchen GARLIC PO Take 1 tablet by mouth every other day.       . hydrochlorothiazide (MICROZIDE) 12.5 MG capsule Take 1 capsule (12.5 mg total) by mouth daily.  30 capsule  11  . isosorbide mononitrate (IMDUR) 60 MG 24 hr tablet Take 1 tablet (60 mg total) by mouth daily.  30 tablet  11  . levothyroxine (SYNTHROID, LEVOTHROID) 112 MCG tablet Take 112 mcg by mouth daily.      . metoprolol tartrate (LOPRESSOR) 25 MG tablet Take 1 tablet (25 mg total) by mouth 2 (two) times daily.  60 tablet  11  . NIACIN CR PO Take 1 tablet by mouth 2 (two) times daily.      . nitroGLYCERIN (NITROSTAT) 0.4 MG SL tablet Place 1 tablet (0.4 mg total) under the tongue every 5 (five) minutes x 3 doses as needed for chest pain.  25 tablet  5  . OVER THE COUNTER MEDICATION Take 1 tablet by mouth daily. Prostate essentials supplement       . SELENIUM PO Take 1 tablet by mouth daily.        Marland Kitchen VITAMIN D, ERGOCALCIFEROL, PO Take 1 tablet by mouth daily. Pt takes 3-4 times weekly       No current facility-administered medications for this visit.    No Known Allergies  History   Social History  . Marital Status: Married    Spouse Name: N/A    Number of Children: 3  . Years of Education: N/A    Occupational History  . 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 Smoking status: Former Smoker -- 0.50 packs/day for 15 years    Types: Cigarettes  . Smokeless tobacco: Not on file  . Alcohol Use: No  . Drug Use: No  . Sexual Activity: Not on file   Other Topics Concern  . Not on file   Social History Narrative  . No narrative on file    Family History  Problem Relation Age of Onset  . Colon cancer Father   . Stroke Mother   . CAD Brother     Review of Systems:  As stated in the HPI and otherwise negative.   BP 108/50  Pulse 60  Ht 5\' 10"  (1.778 m)  Wt 227 lb (102.967 kg)  BMI 32.57 kg/m2  Physical Examination: General: Well developed, well nourished, NAD HEENT: OP clear, mucus membranes moist SKIN: warm, dry. No rashes. Neuro: No focal deficits Musculoskeletal: Muscle strength 5/5 all ext Psychiatric: Mood and affect normal Neck: No JVD, no carotid bruits, no thyromegaly, no lymphadenopathy. Lungs:Clear bilaterally, no wheezes, rhonci, crackles Cardiovascular: Regular rate and rhythm. Harsh systolic murmur. No gallops or rubs. Abdomen:Soft. Bowel sounds present. Non-tender.  Extremities: Trace  bilateral lower extremity edema. Pulses are 1 + in the bilateral DP/PT.  EKG: Sinus brady, rate 52 bpm. RBBB. Poor R wave progression through the pre-cordial leads. Non-specific ST abnormalities.   ECHOCARDIOGRAM 06/2013  Conclusions: 1. Severe aortic valve stenosis.  2. Mild aortic valve regurgitation.  3. Mild mitral valve regurgitation.  4. Severe left atrial enlargement.  5. Left ventricular ejection fraction estimated by 2D at 45-50 percent.  6. There is moderate concentric left ventricle hypertrophy.  7. Mid inferior wall hypokinesis.  8. There is mild tricuspid regurgitation.  9. Mildly elevated estimated right ventricular systolic pressure.  10. Mild right atrial enlargement.  11. Analysis of mitral  valve inflow, pulmonary vein  Doppler and tissue Doppler suggests grade Ia  diastolic dysfunction with elevated left atrial pressure.  Findings:  Left Ventricle: Mildly reduced left ventricular function.  Left ventricular ejection fraction estimated by 2D at 45-50 percent.  There is moderate concentric left ventricle hypertrophy.  Mid inferior wall hypokinesis.  Right Ventricle: Normal RV size.  Normal right ventricular size and function.  Left Atrium: Severe left atrial enlargement.  Right Atrium: Mild right atrial enlargement.  Mitral Valve: Normal mitral valve structure and function.  Mild mitral valve regurgitation.  Tricuspid Valve: There is mild tricuspid regurgitation.  Mildly elevated estimated right ventricular systolic pressure.  Aortic Valve: Severe calcification of the aortic valve.  Severe calcification of the aortic valve.  Severe aortic valve stenosis.  Mild aortic valve regurgitation.  Pulmonic Valve: Trace of pulmonic valve regurgitation.  Aorta: There is mild aortic root dilatation.  Pulmonary Artery: The pulmonary artery is not well visualized.  Pericardium: Normal pericardium.  Diastolic Function: Analysis of mitral valve inflow, pulmonary vein Doppler and tissue Doppler suggests grade Ia diastolic  dysfunction with elevated left atrial pressure.  Other: Inferior vena cava demonstrates a <50% collapse with respiration. 2D Measurements  RVIDd: 2.93 (1.9-2.6) cm  LVIDd: 5.99 (4.2-5.9) cm  LVIDs: 4.75 (2.1-4.0) cm  IVSDd: 1.6 (0.6-1.0) cm  LVPWd: 1.49 (0.6-1.0) cm  Aortic Root: 4.3 (2.0-3.7) cm  LA/AR Ratio: 1.37  EF: 41.3 (>=55) %  FS: 20.7 (25-43) %  SV: 74 (70-100) ml  EDV: 179 (67-155) ml  ESV: 105 (22-58) ml  LA: 5.9 (3.0-4.0) cm  M-MODE Measurements  Mitral Valve  Mitral VTI: 49.6 cm  Peak E: 1 (0.6-1.3) m/s  MV Mean Vel: 0.7 m/s  Peak A: 0.94 (<=.7) m/s  E/A Ratio: 1.1 (.75-1.5)  e' lateral: 8.97 (>=10) cm/s  e' Medial: 5.46 (>=10) cm/s  E/e' Lateral: 11.1 (<=8)  E/e'  Medial: 18.3 (<=8)  PHT: 110 ms  MVA by PHT: 2 (4-6) cm2  DS: 324 cm/s2  DT: 275 (<=200) ms  Tricuspid Valve  TR Peak Vel: 2.28 m/s  TR Peak Grad: 21 mmHg  RAP: 15 (5-10) mmHg  RVSP: 36 (<=30) mmHg  Aortic Valve  Peak: 4.08 (<=2.5) m/s  Peak Grad: 67 (<=16) mmHg  Mean: 2.86 m/s  Mean Grad: 37 (<=5) mmHg  AVA(vel): 0.79 (3-5) cm2  AVA(vti): 0.81 (3-5) cm2  AI PHT: 560 ms  LVOT Diam: 2.1 (1.8-2.4) cm  LVOT PV: 0.93 (0.7-1.1) m/s  LVOT PG: 3 mmHg  LVOT(vti): 24.5 (18-22) cm  LVOT SV: 85 (70-100) ml  ANGIOGRAPHIC DATA: The left main coronary artery is heavily calcified but patent. .  The left anterior descending artery is diffusely diseased and totally occluded proximally..  The left circumflex artery is totally occluded proximally with a diffusely diseased segment before the total occlusion .  The right coronary artery is totally occluded in the mid vessel .  BYPASS GRAFTS ANGIOGRAPHY:  The saphenous vein graft to the diagonal: Totally occluded  The saphenous vein graft to the obtuse marginal: 50% ostial narrowing  The saphenous vein graft sequential to the PDA and distal circumflex: 70-75% ostial ISR. The side-to-side anastomosis with the PDA is 99% obstructed the PDA is diffusely diseased. This would represent a progression since the prior study. The distal anastomosis to the circumflex was widely patent.  Internal mammary graft to LAD: 30-40% ostial narrowing. The LAD distal to the graft insertion is diffusely disease with segmental 90% stenoses noted the apex representing progression from  prior studies.  LEFT VENTRICULOGRAM: Left ventricular angiogram was done in the 30 RAO projection and revealed regional wall motion abnormality and mild systolic dysfunction with an estimated ejection fraction of 45-50%.  IMPRESSIONS: 1. Bypass graft occlusive disease with occlusion of the saphenous vein graft to the diagonal, 50% ostial narrowing in the saphenous vein graft to the distal  circumflex, and 70% ISR in the saphenous vein graft sequential to the PDA and distal circumflex. The side-to-side anastomosis with the PDA is threatened at may be the source of the patient's non-ST elevation MI. This region is not amenable to PCI.  2. Severe native vessel disease with occlusion of the LAD, circumflex, and right coronary  3. Patent LIMA to the LAD but with diffuse distal LAD disease .  4. Mild left ventricular dysfunction, EF 45 .  RECOMMENDATION: 1. Institute medical therapy  2. Progressive symptoms on medical therapy would consider stenting the ostium of the right coronary graft. Intervention on the distal LAD is not feasible.   STS Risk Calculator for Redo CABG and AVR  Risk of Mortality: 5.025%  Morbidity or Mortality: 27.933%  Long Length of Stay: 11.908%  Short Length of Stay: 17.808%  Permanent Stroke: 2.629%  Prolonged Ventilation: 17.202%  DSW Infection: 0.62%  Renal Failure: 8.519%  Reoperation: 11.401%   Assessment:   1. Severe aortic valve stenosis 2. Coronary artery disease s/p CABG 3. Ongoing NYHA class III symptoms with fatigue, exertional dyspnea and chest pressure, likely combination of #1 and #2 above.  Plan:   I have spent 60 minutes today discussing the etiology of his aortic valve disease and his obstructive coronary artery disease. He is having lifestyle limiting symptoms that are likely a combination of his advanced CAD and his severe aortic valve stenosis. As noted by Dr. Cyndia Bent, his STS risk score is 5% but given his advanced age and comorbidities, he is at high risk for traditional open surgical aortic valve replacement. I think that he is a good candidate for the TAVR procedure. He has now decided that he would like to proceed with the workup for TAVR. He is here today with his wife and daughter who are supportive of the plan. Will arrange right and left cardiac cath next week. Will arrange echocardiogram to evaluate LVEF and assess the aortic  valve. He will need to have a gated cardiac CT as well as CT of the chest/abdomen and pelvis. This will be arranged by our TAVR coordinator. Further planning for follow up with our surgical team to be arranged. Pre-cath labs today.

## 2014-04-05 NOTE — Interval H&P Note (Signed)
History and Physical Interval Note:  04/05/2014 7:56 AM  Tim Campbell  has presented today for cardiac cath with the diagnosis of Aortic Stenosis  The various methods of treatment have been discussed with the patient and family. After consideration of risks, benefits and other options for treatment, the patient has consented to  Procedure(s): LEFT AND RIGHT HEART CATHETERIZATION WITH CORONARY/GRAFT ANGIOGRAM (N/A) as a surgical intervention .  The patient's history has been reviewed, patient examined, no change in status, stable for surgery.  I have reviewed the patient's chart and labs.  Questions were answered to the patient's satisfaction.    Cath Lab Visit (complete for each Cath Lab visit)  Clinical Evaluation Leading to the Procedure:   ACS: No.  Non-ACS:    Anginal Classification: CCS I  Anti-ischemic medical therapy: No Therapy  Non-Invasive Test Results: No non-invasive testing performed  Prior CABG: Previous CABG        MCALHANY,CHRISTOPHER

## 2014-04-05 NOTE — Progress Notes (Signed)
Site area:right  groin  Site Prior to Removal:  Level 0  Pressure Applied For 20 MINUTES    Minutes Beginning at [phone]  Manual:  yes   Patient Status During Pull:  stable  Post Pull Groin Site:  Level 0  Post Pull Instructions Given:yes   Post Pull Pulses Present:yes     Dressing Applied:yes, tegaderm dressing applied   Comments:  Bedrest begins at 1100

## 2014-04-05 NOTE — Discharge Instructions (Signed)

## 2014-04-13 ENCOUNTER — Telehealth: Payer: Self-pay | Admitting: Interventional Cardiology

## 2014-04-13 NOTE — Telephone Encounter (Signed)
Daughter, Jackelyn Poling, was wondering if Dr. Angelena Form & Dr. Tamala Julian have discussed what the next step for her father will be since having the catheterization on 04/05/14 She is aware Dr. Tamala Julian is working in the hospital today & that I will forward him this message.  Horton Chin RN

## 2014-04-13 NOTE — Telephone Encounter (Signed)
New message    Waiting to hear from Dr. Tamala Julian on next step for patient.

## 2014-04-14 NOTE — Telephone Encounter (Signed)
returned pt dtr call.adv her of Dr.Smith instructions pt needs vein graft PCI sometime in the next 2 weeks.pt dtr given date that Dr.Smith is in the lab.she will call back with a date after speaking with pt.

## 2014-04-14 NOTE — Telephone Encounter (Signed)
pt  and pt dtr aware cath sch for 7/15 /15 @8 :30am. pt given verbal prprocedure instructions.wriiten preprocedure instructions mailed to pt. pt  an d pt dtr verbalized understanding.

## 2014-04-14 NOTE — Telephone Encounter (Signed)
Patients daughter is returning your call. Please call [phone] Jackelyn Poling [place])

## 2014-04-14 NOTE — Telephone Encounter (Signed)
returned call lmtcb

## 2014-04-14 NOTE — Telephone Encounter (Signed)
Give the patient set up to have the saphenous vein graft PCI sometime in the next 2 weeks. Please get him set up to have it done with me on today's lab available in the cath lab.

## 2014-04-15 ENCOUNTER — Telehealth: Payer: Self-pay | Admitting: Interventional Cardiology

## 2014-04-15 ENCOUNTER — Encounter (HOSPITAL_COMMUNITY): Payer: Self-pay | Admitting: Pharmacy Technician

## 2014-04-15 NOTE — Telephone Encounter (Signed)
Patient's daughter is calling confused on procedures. Please call and advise.

## 2014-04-15 NOTE — Telephone Encounter (Signed)
returned pt daughter call.pt daughter spoke with Dr.Smith directly who answered questions on pt upcoming procedure.

## 2014-04-16 ENCOUNTER — Telehealth: Payer: Self-pay | Admitting: Interventional Cardiology

## 2014-04-16 NOTE — Telephone Encounter (Signed)
Echo scheduled for 04/28/2014, pt notified

## 2014-04-16 NOTE — Telephone Encounter (Signed)
Pt daughter cancelled echo scheduled for 04/20/2014 - per pt daughter says dr Tamala Julian has to go in and put stents in pt,  it looks like the stent will be on 04/21/2014.  Does the pt still need the echo or ok to remove the order?

## 2014-04-16 NOTE — Telephone Encounter (Signed)
Pt was seen by Dr.MaCalhany in the valve clinic pt needs echo to evaluate LV function for possible tavr

## 2014-04-20 ENCOUNTER — Other Ambulatory Visit (HOSPITAL_COMMUNITY): Payer: Medicare Other

## 2014-04-20 ENCOUNTER — Telehealth: Payer: Self-pay | Admitting: *Deleted

## 2014-04-20 NOTE — Telephone Encounter (Signed)
Spoke with pt's daughter [name]. Pt is scheduled for cardiac cath on 04/21/14 with Dr. Daneen Schick. I went over cardiac craterization instructions . [name] verbalized understanding.

## 2014-04-21 ENCOUNTER — Encounter (HOSPITAL_COMMUNITY)
Admission: RE | Disposition: A | Discharge status: Home / Self Care | Payer: Self-pay | Source: Ambulatory Visit | Attending: Interventional Cardiology

## 2014-04-21 ENCOUNTER — Encounter (HOSPITAL_COMMUNITY): Payer: Self-pay | Admitting: General Practice

## 2014-04-21 ENCOUNTER — Ambulatory Visit (HOSPITAL_COMMUNITY)
Admission: RE | Admit: 2014-04-21 | Discharge: 2014-04-22 | Disposition: A | Discharge status: Home / Self Care | Payer: Medicare Other | Source: Ambulatory Visit | Attending: Interventional Cardiology | Admitting: Interventional Cardiology

## 2014-04-21 DIAGNOSIS — N400 Enlarged prostate without lower urinary tract symptoms: Secondary | ICD-10-CM | POA: Insufficient documentation

## 2014-04-21 DIAGNOSIS — I10 Essential (primary) hypertension: Secondary | ICD-10-CM | POA: Diagnosis not present

## 2014-04-21 DIAGNOSIS — Z7982 Long term (current) use of aspirin: Secondary | ICD-10-CM | POA: Diagnosis not present

## 2014-04-21 DIAGNOSIS — K5730 Diverticulosis of large intestine without perforation or abscess without bleeding: Secondary | ICD-10-CM | POA: Diagnosis not present

## 2014-04-21 DIAGNOSIS — I5032 Chronic diastolic (congestive) heart failure: Secondary | ICD-10-CM | POA: Diagnosis not present

## 2014-04-21 DIAGNOSIS — E559 Vitamin D deficiency, unspecified: Secondary | ICD-10-CM | POA: Insufficient documentation

## 2014-04-21 DIAGNOSIS — E039 Hypothyroidism, unspecified: Secondary | ICD-10-CM | POA: Diagnosis not present

## 2014-04-21 DIAGNOSIS — I359 Nonrheumatic aortic valve disorder, unspecified: Secondary | ICD-10-CM | POA: Insufficient documentation

## 2014-04-21 DIAGNOSIS — I5043 Acute on chronic combined systolic (congestive) and diastolic (congestive) heart failure: Secondary | ICD-10-CM | POA: Diagnosis present

## 2014-04-21 DIAGNOSIS — I25708 Atherosclerosis of coronary artery bypass graft(s), unspecified, with other forms of angina pectoris: Secondary | ICD-10-CM

## 2014-04-21 DIAGNOSIS — Z7902 Long term (current) use of antithrombotics/antiplatelets: Secondary | ICD-10-CM | POA: Diagnosis not present

## 2014-04-21 DIAGNOSIS — I509 Heart failure, unspecified: Secondary | ICD-10-CM | POA: Insufficient documentation

## 2014-04-21 DIAGNOSIS — C8589 Other specified types of non-Hodgkin lymphoma, extranodal and solid organ sites: Secondary | ICD-10-CM | POA: Insufficient documentation

## 2014-04-21 DIAGNOSIS — I2510 Atherosclerotic heart disease of native coronary artery without angina pectoris: Secondary | ICD-10-CM | POA: Insufficient documentation

## 2014-04-21 DIAGNOSIS — E785 Hyperlipidemia, unspecified: Secondary | ICD-10-CM | POA: Diagnosis not present

## 2014-04-21 DIAGNOSIS — G4700 Insomnia, unspecified: Secondary | ICD-10-CM | POA: Diagnosis not present

## 2014-04-21 DIAGNOSIS — I4510 Unspecified right bundle-branch block: Secondary | ICD-10-CM | POA: Insufficient documentation

## 2014-04-21 DIAGNOSIS — Z87891 Personal history of nicotine dependence: Secondary | ICD-10-CM | POA: Insufficient documentation

## 2014-04-21 DIAGNOSIS — I252 Old myocardial infarction: Secondary | ICD-10-CM | POA: Insufficient documentation

## 2014-04-21 DIAGNOSIS — I25810 Atherosclerosis of coronary artery bypass graft(s) without angina pectoris: Secondary | ICD-10-CM

## 2014-04-21 DIAGNOSIS — I25119 Atherosclerotic heart disease of native coronary artery with unspecified angina pectoris: Secondary | ICD-10-CM | POA: Diagnosis present

## 2014-04-21 DIAGNOSIS — I209 Angina pectoris, unspecified: Secondary | ICD-10-CM | POA: Diagnosis present

## 2014-04-21 HISTORY — PX: PERCUTANEOUS CORONARY STENT INTERVENTION (PCI-S): SHX5485

## 2014-04-21 LAB — BASIC METABOLIC PANEL
ANION GAP: 12 (ref 5–15)
BUN: 21 mg/dL (ref 6–23)
CO2: 27 mEq/L (ref 19–32)
CREATININE: 0.91 mg/dL (ref 0.50–1.35)
Calcium: 9.3 mg/dL (ref 8.4–10.5)
Chloride: 102 mEq/L (ref 96–112)
GFR calc non Af Amer: 75 mL/min — ABNORMAL LOW (ref >90)
GFR, EST AFRICAN AMERICAN: 87 mL/min — AB (ref >90)
Glucose, Bld: 95 mg/dL (ref 70–99)
POTASSIUM: 4.2 meq/L (ref 3.7–5.3)
Sodium: 141 mEq/L (ref 137–147)

## 2014-04-21 LAB — POCT ACTIVATED CLOTTING TIME
Activated Clotting Time: 225 seconds
Activated Clotting Time: 242 seconds
Activated Clotting Time: 287 seconds

## 2014-04-21 LAB — CBC
HCT: 42.2 % (ref 39.0–52.0)
Hemoglobin: 13.5 g/dL (ref 13.0–17.0)
MCH: 27.5 pg (ref 26.0–34.0)
MCHC: 32 g/dL (ref 30.0–36.0)
MCV: 85.9 fL (ref 78.0–100.0)
PLATELETS: 159 10*3/uL (ref 150–400)
RBC: 4.91 MIL/uL (ref 4.22–5.81)
RDW: 14.9 % (ref 11.5–15.5)
WBC: 6.8 10*3/uL (ref 4.0–10.5)

## 2014-04-21 LAB — PROTIME-INR
INR: 1.13 (ref 0.00–1.49)
Prothrombin Time: 14.5 seconds (ref 11.6–15.2)

## 2014-04-21 SURGERY — PERCUTANEOUS CORONARY STENT INTERVENTION (PCI-S)
Anesthesia: LOCAL

## 2014-04-21 MED ORDER — SODIUM CHLORIDE 0.9 % IJ SOLN: 3.0000 mL | Freq: Two times a day (BID) | INTRAMUSCULAR | Status: DC

## 2014-04-21 MED ORDER — CLOPIDOGREL BISULFATE 75 MG PO TABS
75.0000 mg | ORAL_TABLET | Freq: Every day | ORAL | Status: DC
  Administered 2014-04-21 – 2014-04-22 (×2): 75 mg via ORAL

## 2014-04-21 MED ORDER — SODIUM CHLORIDE 0.9 % IV SOLN: INTRAVENOUS | Status: AC

## 2014-04-21 MED ORDER — MIDAZOLAM HCL 2 MG/2ML IJ SOLN
INTRAMUSCULAR | Status: AC
  Filled 2014-04-21: qty 2

## 2014-04-21 MED ORDER — ASPIRIN 81 MG PO CHEW
CHEWABLE_TABLET | ORAL | Status: AC
  Filled 2014-04-21: qty 1

## 2014-04-21 MED ORDER — ACETAMINOPHEN 325 MG PO TABS: 650.0000 mg | ORAL_TABLET | ORAL | Status: DC | PRN

## 2014-04-21 MED ORDER — ISOSORBIDE MONONITRATE ER 60 MG PO TB24
60.0000 mg | ORAL_TABLET | Freq: Every day | ORAL | Status: DC
  Administered 2014-04-22: 11:00:00 60 mg via ORAL
  Filled 2014-04-21: qty 1

## 2014-04-21 MED ORDER — HYDRALAZINE HCL 20 MG/ML IJ SOLN: 10.0000 mg | Freq: Once | INTRAMUSCULAR | Status: DC

## 2014-04-21 MED ORDER — NITROGLYCERIN 1 MG/10 ML FOR IR/CATH LAB
INTRA_ARTERIAL | Status: AC
  Filled 2014-04-21: qty 10

## 2014-04-21 MED ORDER — SODIUM CHLORIDE 0.9 % IV SOLN
INTRAVENOUS | Status: DC
  Administered 2014-04-21: 07:00:00 via INTRAVENOUS

## 2014-04-21 MED ORDER — LIDOCAINE HCL (PF) 1 % IJ SOLN
INTRAMUSCULAR | Status: AC
  Filled 2014-04-21: qty 30

## 2014-04-21 MED ORDER — HEPARIN SODIUM (PORCINE) 1000 UNIT/ML IJ SOLN
INTRAMUSCULAR | Status: AC
Start: 2014-04-21 — End: 2014-04-21
  Filled 2014-04-21: qty 1

## 2014-04-21 MED ORDER — SODIUM CHLORIDE 0.9 % IV SOLN: 250.0000 mL | INTRAVENOUS | Status: DC | PRN

## 2014-04-21 MED ORDER — HEPARIN SODIUM (PORCINE) 1000 UNIT/ML IJ SOLN
INTRAMUSCULAR | Status: AC
  Filled 2014-04-21: qty 1

## 2014-04-21 MED ORDER — HYDROCHLOROTHIAZIDE 12.5 MG PO CAPS
12.5000 mg | ORAL_CAPSULE | Freq: Every day | ORAL | Status: DC
  Administered 2014-04-22: 12.5 mg via ORAL
  Filled 2014-04-21: qty 1

## 2014-04-21 MED ORDER — TAMSULOSIN HCL 0.4 MG PO CAPS
0.4000 mg | ORAL_CAPSULE | Freq: Every day | ORAL | Status: DC
  Administered 2014-04-21 – 2014-04-22 (×2): 0.4 mg via ORAL
  Filled 2014-04-21 (×2): qty 1

## 2014-04-21 MED ORDER — NITROGLYCERIN 0.2 MG/ML ON CALL CATH LAB
INTRAVENOUS | Status: AC
  Filled 2014-04-21: qty 1

## 2014-04-21 MED ORDER — HEPARIN (PORCINE) IN NACL 2-0.9 UNIT/ML-% IJ SOLN
INTRAMUSCULAR | Status: AC
  Filled 2014-04-21: qty 1000

## 2014-04-21 MED ORDER — NITROGLYCERIN 0.4 MG SL SUBL: 0.4000 mg | SUBLINGUAL_TABLET | SUBLINGUAL | Status: DC | PRN

## 2014-04-21 MED ORDER — METOPROLOL TARTRATE 25 MG PO TABS
25.0000 mg | ORAL_TABLET | Freq: Two times a day (BID) | ORAL | Status: DC
  Administered 2014-04-22: 11:00:00 25 mg via ORAL
  Filled 2014-04-21 (×2): qty 1

## 2014-04-21 MED ORDER — OXYCODONE-ACETAMINOPHEN 5-325 MG PO TABS: 1.0000 | ORAL_TABLET | ORAL | Status: DC | PRN

## 2014-04-21 MED ORDER — FENTANYL CITRATE 0.05 MG/ML IJ SOLN
INTRAMUSCULAR | Status: AC
  Filled 2014-04-21: qty 2

## 2014-04-21 MED ORDER — SODIUM CHLORIDE 0.9 % IJ SOLN
3.0000 mL | INTRAMUSCULAR | Status: DC | PRN
  Administered 2014-04-21: 3 mL via INTRAVENOUS

## 2014-04-21 MED ORDER — SODIUM CHLORIDE 0.9 % IV SOLN: INTRAVENOUS | Status: DC

## 2014-04-21 MED ORDER — ASPIRIN EC 81 MG PO TBEC
81.0000 mg | DELAYED_RELEASE_TABLET | Freq: Every day | ORAL | Status: DC
  Administered 2014-04-21 – 2014-04-22 (×2): 81 mg via ORAL
  Filled 2014-04-21 (×2): qty 1

## 2014-04-21 MED ORDER — ONDANSETRON HCL 4 MG/2ML IJ SOLN
4.0000 mg | Freq: Four times a day (QID) | INTRAMUSCULAR | Status: DC | PRN
  Administered 2014-04-21: 4 mg via INTRAVENOUS
  Filled 2014-04-21: qty 2

## 2014-04-21 MED ORDER — ATORVASTATIN CALCIUM 40 MG PO TABS
40.0000 mg | ORAL_TABLET | Freq: Every day | ORAL | Status: DC
  Administered 2014-04-21: 40 mg via ORAL
  Filled 2014-04-21 (×2): qty 1

## 2014-04-21 MED ORDER — CLOPIDOGREL BISULFATE 75 MG PO TABS
ORAL_TABLET | ORAL | Status: AC
  Filled 2014-04-21: qty 2

## 2014-04-21 MED ORDER — ASPIRIN 81 MG PO CHEW
81.0000 mg | CHEWABLE_TABLET | ORAL | Status: AC
  Administered 2014-04-21: 81 mg via ORAL

## 2014-04-21 MED ORDER — LEVOTHYROXINE SODIUM 112 MCG PO TABS
112.0000 ug | ORAL_TABLET | Freq: Every day | ORAL | Status: DC
  Administered 2014-04-22: 112 ug via ORAL
  Filled 2014-04-21 (×2): qty 1

## 2014-04-21 NOTE — CV Procedure (Signed)
PERCUTANEOUS CORONARY INTERVENTION   Tim Campbell is a 80 y.o. male  INDICATION: Class III-IV angina pectoris with new high-grade obstruction in the ostium of the saphenous vein graft to the obtuse marginal. This is a staged procedure in preparation for TAVR.   PROCEDURE: 1. Coronary angiography; 2. Bare-metal stent ostium of the saphenous vein graft to the obtuse marginal with distal protection   CONSENT: The risks, benefits, and details of the procedure were explained to the patient. Risks including death, MI, stroke, bleeding, limb ischemia, emergency CABG, renal failure and allergy were described and accepted by the patient.  Informed written consent was obtained prior to proceeding.  PROCEDURE TECHNIQUE:  After Xylocaine anesthesia a 6 French sheath was placed in the right femoral artery with a single anterior needle wall stick.   Coronary guiding shots were made using a 6 French Amplatz Left 0.75 cm guide catheter. Antithrombotic therapy, 8000 units of IV heparin as a bolus was given and determined to have a therapeutic by ACT. Antiplatelet therapy, Plavix 150 mg, was loaded on top of chronic daily therapy. An additional 2500 units of heparin was administered at the end of the case.  After obtaining guiding shots we used a BMW wire across the stenosis in the proximal saphenous vein graft to the obtuse marginal. We then used a 4.0 mm Spider FX distal protection device and deployed in the distal third of the vein graft. After the basket was probably deployed, and 18 mm long by 4.0 mm diameter MultiLink Vision bare-metal stent was deployed at 12 atmospheres. A persistent waist was noted. We then use a 4.0 x 12 mm long Zion Euphora to post dilate to 16 atmospheres x2 inflations. We retrieved these Spider basket without difficulty. Final angiographic images were acceptable.  We then turned our attention to the saphenous vein graft to the right coronary. An MP A2 6 [place] guide catheter  was used to obtain guiding shots of the SVG to the distal RCA. Cinefluoroscopy using 30 frames per second demonstrated that the previously placed stent extended beyond the ostium of the saphenous vein graft into the aorta by at least 5 or perhaps 6 mm. All angiographic images were outside the lumen of this graft and demonstrated a stable 50-70% ostial narrowing. The appearance is unchanged when compared to a study performed 18 months ago. I chose against stenting the right coronary as treatment would be through the struts of a previously placed stent with crush and deformation of the aortic portion of the previously placed stent. Furthermore a notable are of stent will likely increase the risk of more aggressive restenosis.  ACT postprocedure was 280 seconds and an additional 2500 units of heparin was given.  The case was terminated and hemostasis was achieved with Angio-Seal.  CONTRAST:  Total of 205 cc.  COMPLICATIONS:  None.    ANGIOGRAPHIC RESULTS:   95% ostial/proximal SVG to the obtuse marginal reduced to less than 20% with TIMI grade 3 flow after BM stenting using distal protection with the Spider FX device. TIMI grade 3 flow was noted. No immediate complications were noted.   IMPRESSIONS:  1. Successful bare-metal stent in the ostial/proximal SVG to the circumflex reducing a 95% stenosis to less than 25% with TIMI grade 3 flow  2. Stable 50-70% ostial stenosis of the saphenous vein graft to the distal right coronary. The previously placed stent extends from the graft ostium by at least 5-6 mm. With the lesion being stable over 16-18  months, intervention was not performed and this lesion can be treated medically.   RECOMMENDATION:   Aspirin and Plavix will be continued Plan discharge in a.m. Set up 2-D Doppler echocardiogram and followup in the Valve Clinic with Dr. Cyndia Bent.    [name], MD 04/21/2014 10:24 AM

## 2014-04-21 NOTE — H&P (View-Only) (Signed)
[facility]   History of Present Illness: 80 yo male with history of CAD s/p CABG, HTN, Hyperlipidemia, hypothyroidism, large cell non-Hodgkins lymphoma and severe aortic valve stenosis here today for evaluation for TAVR. He underwent 5V CABG in 1993. He had a NSTEMI in 2005 and was found to have occlusion of the vein graft to the Diagonal. He has since had a bare metal stent placed in the vein graft to the PDA in January 2012. Grafts to the LAD and OM were patent at that time. Last cath in March 2014 showed a 70% in stent restenosis of the SVG to the RCA, 50% proximal stenosis of the SVG to the OM, chronic occlusion of the SVG to the Diagonal, and a patent LIMA to the LAD which was diffusely diseased. His last echo in September 2014 showed severe AS with a peak velocity of 4.08 m/s, a mean aortic valve gradient of 37 mm Hg, and an AVA of 0.8 cm2. There was mild AI and mild MR with an LVEF of 45-50%. He has been followed in our office by Dr. Pernell Dupre. He has been seen by Dr. Gilford Raid in the [place] surgery office 12/16/13.   He tells me today that he has been having chest pressure and shortness of breath with mild activity. The chest pressure is most worrisome to him. His symptoms are much worse when walking up hills or when lifting heavy objects. He mows his grass on a riding mower. He has been unable to work in his garden this summer without constant fatigue. He denies dizziness or syncope. Minimal lower extremity edema.   Primary Care Physician: [name]  Past Medical History  Diagnosis Date  . Coronary artery disease     with occluded SVG to diagonal and BMS to prox SVG of RCA 10/2010. Recth 12/2012, 70% ISR SVG to RCA, 50% SVG to Cfx, 100% SVG to diagonal, and Patent LIMA with diffuse LAD dz.  . Hypertension   . Non-ST elevation MI (NSTEMI)   . Hyperlipidemia   . Insomnia   .  Vitamin D deficiency   . Aortic stenosis     severe by echo September 2014  . Hypothyroidism   . RBBB   . Diverticulosis   . Non Hodgkin's lymphoma     Past Surgical History  Procedure Laterality Date  . Ankle surgery    . Coronary artery bypass graft  1993  . Hemorrhoid surgery    . Right inguinal hernia repair      Current Outpatient Prescriptions  Medication Sig Dispense Refill  . aspirin 81 MG tablet Take 81 mg by mouth daily.      Marland Kitchen atorvastatin (LIPITOR) 40 MG tablet Take 1 tablet (40 mg total) by mouth daily at 6 PM.  30 tablet  11  . clopidogrel (PLAVIX) 75 MG tablet Take 1 tablet (75 mg total) by mouth daily with breakfast.  30 tablet  11  . Cyanocobalamin (VITAMIN B 12 PO)  Take 1 tablet by mouth daily.      Marland Kitchen GARLIC PO Take 1 tablet by mouth every other day.       . hydrochlorothiazide (MICROZIDE) 12.5 MG capsule Take 1 capsule (12.5 mg total) by mouth daily.  30 capsule  11  . isosorbide mononitrate (IMDUR) 60 MG 24 hr tablet Take 1 tablet (60 mg total) by mouth daily.  30 tablet  11  . levothyroxine (SYNTHROID, LEVOTHROID) 112 MCG tablet Take 112 mcg by mouth daily.      . metoprolol tartrate (LOPRESSOR) 25 MG tablet Take 1 tablet (25 mg total) by mouth 2 (two) times daily.  60 tablet  11  . NIACIN CR PO Take 1 tablet by mouth 2 (two) times daily.      . nitroGLYCERIN (NITROSTAT) 0.4 MG SL tablet Place 1 tablet (0.4 mg total) under the tongue every 5 (five) minutes x 3 doses as needed for chest pain.  25 tablet  5  . OVER THE COUNTER MEDICATION Take 1 tablet by mouth daily. Prostate essentials supplement       . SELENIUM PO Take 1 tablet by mouth daily.        Marland Kitchen VITAMIN D, ERGOCALCIFEROL, PO Take 1 tablet by mouth daily. Pt takes 3-4 times weekly       No current facility-administered medications for this visit.    No Known Allergies  History   Social History  . Marital Status: Married    Spouse Name: N/A    Number of Children: 3  . Years of Education: N/A    Occupational History  . 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 Smoking status: Former Smoker -- 0.50 packs/day for 15 years    Types: Cigarettes  . Smokeless tobacco: Not on file  . Alcohol Use: No  . Drug Use: No  . Sexual Activity: Not on file   Other Topics Concern  . Not on file   Social History Narrative  . No narrative on file    Family History  Problem Relation Age of Onset  . Colon cancer Father   . Stroke Mother   . CAD Brother     Review of Systems:  As stated in the HPI and otherwise negative.   BP 108/50  Pulse 60  Ht 5\' 10"  (1.778 m)  Wt 227 lb (102.967 kg)  BMI 32.57 kg/m2  Physical Examination: General: Well developed, well nourished, NAD HEENT: OP clear, mucus membranes moist SKIN: warm, dry. No rashes. Neuro: No focal deficits Musculoskeletal: Muscle strength 5/5 all ext Psychiatric: Mood and affect normal Neck: No JVD, no carotid bruits, no thyromegaly, no lymphadenopathy. Lungs:Clear bilaterally, no wheezes, rhonci, crackles Cardiovascular: Regular rate and rhythm. Harsh systolic murmur. No gallops or rubs. Abdomen:Soft. Bowel sounds present. Non-tender.  Extremities: Trace  bilateral lower extremity edema. Pulses are 1 + in the bilateral DP/PT.  EKG: Sinus brady, rate 52 bpm. RBBB. Poor R wave progression through the pre-cordial leads. Non-specific ST abnormalities.   ECHOCARDIOGRAM 06/2013  Conclusions: 1. Severe aortic valve stenosis.  2. Mild aortic valve regurgitation.  3. Mild mitral valve regurgitation.  4. Severe left atrial enlargement.  5. Left ventricular ejection fraction estimated by 2D at 45-50 percent.  6. There is moderate concentric left ventricle hypertrophy.  7. Mid inferior wall hypokinesis.  8. There is mild tricuspid regurgitation.  9. Mildly elevated estimated right ventricular systolic pressure.  10. Mild right atrial enlargement.  11. Analysis of mitral  valve inflow, pulmonary vein  Doppler and tissue Doppler suggests grade Ia  diastolic dysfunction with elevated left atrial pressure.  Findings:  Left Ventricle: Mildly reduced left ventricular function.  Left ventricular ejection fraction estimated by 2D at 45-50 percent.  There is moderate concentric left ventricle hypertrophy.  Mid inferior wall hypokinesis.  Right Ventricle: Normal RV size.  Normal right ventricular size and function.  Left Atrium: Severe left atrial enlargement.  Right Atrium: Mild right atrial enlargement.  Mitral Valve: Normal mitral valve structure and function.  Mild mitral valve regurgitation.  Tricuspid Valve: There is mild tricuspid regurgitation.  Mildly elevated estimated right ventricular systolic pressure.  Aortic Valve: Severe calcification of the aortic valve.  Severe calcification of the aortic valve.  Severe aortic valve stenosis.  Mild aortic valve regurgitation.  Pulmonic Valve: Trace of pulmonic valve regurgitation.  Aorta: There is mild aortic root dilatation.  Pulmonary Artery: The pulmonary artery is not well visualized.  Pericardium: Normal pericardium.  Diastolic Function: Analysis of mitral valve inflow, pulmonary vein Doppler and tissue Doppler suggests grade Ia diastolic  dysfunction with elevated left atrial pressure.  Other: Inferior vena cava demonstrates a <50% collapse with respiration. 2D Measurements  RVIDd: 2.93 (1.9-2.6) cm  LVIDd: 5.99 (4.2-5.9) cm  LVIDs: 4.75 (2.1-4.0) cm  IVSDd: 1.6 (0.6-1.0) cm  LVPWd: 1.49 (0.6-1.0) cm  Aortic Root: 4.3 (2.0-3.7) cm  LA/AR Ratio: 1.37  EF: 41.3 (>=55) %  FS: 20.7 (25-43) %  SV: 74 (70-100) ml  EDV: 179 (67-155) ml  ESV: 105 (22-58) ml  LA: 5.9 (3.0-4.0) cm  M-MODE Measurements  Mitral Valve  Mitral VTI: 49.6 cm  Peak E: 1 (0.6-1.3) m/s  MV Mean Vel: 0.7 m/s  Peak A: 0.94 (<=.7) m/s  E/A Ratio: 1.1 (.75-1.5)  e' lateral: 8.97 (>=10) cm/s  e' Medial: 5.46 (>=10) cm/s  E/e' Lateral: 11.1 (<=8)  E/e'  Medial: 18.3 (<=8)  PHT: 110 ms  MVA by PHT: 2 (4-6) cm2  DS: 324 cm/s2  DT: 275 (<=200) ms  Tricuspid Valve  TR Peak Vel: 2.28 m/s  TR Peak Grad: 21 mmHg  RAP: 15 (5-10) mmHg  RVSP: 36 (<=30) mmHg  Aortic Valve  Peak: 4.08 (<=2.5) m/s  Peak Grad: 67 (<=16) mmHg  Mean: 2.86 m/s  Mean Grad: 37 (<=5) mmHg  AVA(vel): 0.79 (3-5) cm2  AVA(vti): 0.81 (3-5) cm2  AI PHT: 560 ms  LVOT Diam: 2.1 (1.8-2.4) cm  LVOT PV: 0.93 (0.7-1.1) m/s  LVOT PG: 3 mmHg  LVOT(vti): 24.5 (18-22) cm  LVOT SV: 85 (70-100) ml  ANGIOGRAPHIC DATA: The left main coronary artery is heavily calcified but patent. .  The left anterior descending artery is diffusely diseased and totally occluded proximally..  The left circumflex artery is totally occluded proximally with a diffusely diseased segment before the total occlusion .  The right coronary artery is totally occluded in the mid vessel .  BYPASS GRAFTS ANGIOGRAPHY:  The saphenous vein graft to the diagonal: Totally occluded  The saphenous vein graft to the obtuse marginal: 50% ostial narrowing  The saphenous vein graft sequential to the PDA and distal circumflex: 70-75% ostial ISR. The side-to-side anastomosis with the PDA is 99% obstructed the PDA is diffusely diseased. This would represent a progression since the prior study. The distal anastomosis to the circumflex was widely patent.  Internal mammary graft to LAD: 30-40% ostial narrowing. The LAD distal to the graft insertion is diffusely disease with segmental 90% stenoses noted the apex representing progression from  prior studies.  LEFT VENTRICULOGRAM: Left ventricular angiogram was done in the 30 RAO projection and revealed regional wall motion abnormality and mild systolic dysfunction with an estimated ejection fraction of 45-50%.  IMPRESSIONS: 1. Bypass graft occlusive disease with occlusion of the saphenous vein graft to the diagonal, 50% ostial narrowing in the saphenous vein graft to the distal  circumflex, and 70% ISR in the saphenous vein graft sequential to the PDA and distal circumflex. The side-to-side anastomosis with the PDA is threatened at may be the source of the patient's non-ST elevation MI. This region is not amenable to PCI.  2. Severe native vessel disease with occlusion of the LAD, circumflex, and right coronary  3. Patent LIMA to the LAD but with diffuse distal LAD disease .  4. Mild left ventricular dysfunction, EF 45 .  RECOMMENDATION: 1. Institute medical therapy  2. Progressive symptoms on medical therapy would consider stenting the ostium of the right coronary graft. Intervention on the distal LAD is not feasible.   STS Risk Calculator for Redo CABG and AVR  Risk of Mortality: 5.025%  Morbidity or Mortality: 27.933%  Long Length of Stay: 11.908%  Short Length of Stay: 17.808%  Permanent Stroke: 2.629%  Prolonged Ventilation: 17.202%  DSW Infection: 0.62%  Renal Failure: 8.519%  Reoperation: 11.401%   Assessment:   1. Severe aortic valve stenosis 2. Coronary artery disease s/p CABG 3. Ongoing NYHA class III symptoms with fatigue, exertional dyspnea and chest pressure, likely combination of #1 and #2 above.  Plan:   I have spent 60 minutes today discussing the etiology of his aortic valve disease and his obstructive coronary artery disease. He is having lifestyle limiting symptoms that are likely a combination of his advanced CAD and his severe aortic valve stenosis. As noted by Dr. Cyndia Bent, his STS risk score is 5% but given his advanced age and comorbidities, he is at high risk for traditional open surgical aortic valve replacement. I think that he is a good candidate for the TAVR procedure. He has now decided that he would like to proceed with the workup for TAVR. He is here today with his wife and daughter who are supportive of the plan. Will arrange right and left cardiac cath next week. Will arrange echocardiogram to evaluate LVEF and assess the aortic  valve. He will need to have a gated cardiac CT as well as CT of the chest/abdomen and pelvis. This will be arranged by our TAVR coordinator. Further planning for follow up with our surgical team to be arranged. Pre-cath labs today.

## 2014-04-21 NOTE — Interval H&P Note (Signed)
Cath Lab Visit (complete for each Cath Lab visit)  Clinical Evaluation Leading to the Procedure:   ACS: No.  Non-ACS:    Anginal Classification: CCS III  Anti-ischemic medical therapy: Maximal Therapy (2 or more classes of medications)  Non-Invasive Test Results: No non-invasive testing performed  Prior CABG: Previous CABG      History and Physical Interval Note:  04/21/2014 9:02 AM  Tim Campbell  has presented today for surgery, with the diagnosis of Angina, CP  The various methods of treatment have been discussed with the patient and family. After consideration of risks, benefits and other options for treatment, the patient has consented to  Procedure(s): PERCUTANEOUS CORONARY STENT INTERVENTION (PCI-S) (N/A) as a surgical intervention .  The patient's history has been reviewed, patient examined, no change in status, stable for surgery.  I have reviewed the patient's chart and labs.  Questions were answered to the patient's satisfaction.     Tim Campbell

## 2014-04-21 NOTE — Progress Notes (Signed)
Foely cath 16 french inserted with no complication with good return. Prior to this patient had right groin to bleed which pressure was held and then he complained of pressure to have a BM and heart rate fell to 40 followed by nausea and diaphoresis.  SBP elevated to 180. Bladder scan done with 700-800 cc shown. Notified Cecilie Kicks orders received. Foley cath inserted and felt better which SBP also came down. Patient comfortable at this time right groin looks good and will continue bedrest for another hour.

## 2014-04-21 NOTE — Progress Notes (Signed)
Pt has bleed at rt groin cath site, angio seal present.  He is also complaining of needing to void, he told the nurse he has BPH and has had difficulty at home, going small amounts frequently. I added flomax, pt should see urologist as outpt.  For now will do I&O with # 10 french cath. Pt with approx 800cc of urine with bladder scan.  Pt with elevated BP and need to have BM as well.  Will empty bladder first.  Hydralazine if BP still elevated post cath.  BM pressure may ease as well.

## 2014-04-22 ENCOUNTER — Telehealth: Payer: Self-pay | Admitting: Physician Assistant

## 2014-04-22 DIAGNOSIS — I10 Essential (primary) hypertension: Secondary | ICD-10-CM

## 2014-04-22 DIAGNOSIS — I359 Nonrheumatic aortic valve disorder, unspecified: Secondary | ICD-10-CM

## 2014-04-22 DIAGNOSIS — I25810 Atherosclerosis of coronary artery bypass graft(s) without angina pectoris: Secondary | ICD-10-CM | POA: Diagnosis not present

## 2014-04-22 DIAGNOSIS — I209 Angina pectoris, unspecified: Secondary | ICD-10-CM | POA: Diagnosis not present

## 2014-04-22 LAB — BASIC METABOLIC PANEL
Anion gap: 12 (ref 5–15)
BUN: 18 mg/dL (ref 6–23)
CHLORIDE: 101 meq/L (ref 96–112)
CO2: 29 mEq/L (ref 19–32)
CREATININE: 0.93 mg/dL (ref 0.50–1.35)
Calcium: 8.9 mg/dL (ref 8.4–10.5)
GFR, EST AFRICAN AMERICAN: 86 mL/min — AB (ref >90)
GFR, EST NON AFRICAN AMERICAN: 74 mL/min — AB (ref >90)
Glucose, Bld: 95 mg/dL (ref 70–99)
POTASSIUM: 4.7 meq/L (ref 3.7–5.3)
Sodium: 142 mEq/L (ref 137–147)

## 2014-04-22 LAB — CBC
HCT: 41.7 % (ref 39.0–52.0)
Hemoglobin: 13.3 g/dL (ref 13.0–17.0)
MCH: 27.8 pg (ref 26.0–34.0)
MCHC: 31.9 g/dL (ref 30.0–36.0)
MCV: 87.2 fL (ref 78.0–100.0)
Platelets: 146 10*3/uL — ABNORMAL LOW (ref 150–400)
RBC: 4.78 MIL/uL (ref 4.22–5.81)
RDW: 15 % (ref 11.5–15.5)
WBC: 6.9 10*3/uL (ref 4.0–10.5)

## 2014-04-22 MED ORDER — TAMSULOSIN HCL 0.4 MG PO CAPS: 0.4000 mg | ORAL_CAPSULE | Freq: Every day | ORAL | Status: DC

## 2014-04-22 NOTE — Telephone Encounter (Signed)
Opened in error

## 2014-04-22 NOTE — Progress Notes (Signed)
Subjective: No SOB or CP  Objective: Vital signs in last 24 hours: Temp:  [97.7 F (36.5 C)-98.3 F (36.8 C)] 97.7 F (36.5 C) (07/16 7591) Pulse Rate:  [56-70] 68 (07/16 0613) Resp:  [16-18] 18 (07/16 0613) BP: (89-188)/(23-104) 103/46 mmHg (07/16 0613) SpO2:  [94 %-99 %] 94 % (07/16 0613) Weight:  [218 lb (98.884 kg)-218 lb 14.7 oz (99.3 kg)] 218 lb 14.7 oz (99.3 kg) (07/16 0026) Last BM Date: 04/20/14  Intake/Output from previous day: 07/15 0701 - 07/16 0700 In: 1080 [P.O.:480; I.V.:600] Out: 3125 [Urine:3125] Intake/Output this shift: Total I/O In: -  Out: 600 [Urine:600]  Medications Current Facility-Administered Medications  Medication Dose Route Frequency Provider Last Rate Last Dose  . acetaminophen (TYLENOL) tablet 650 mg  650 mg Oral Q4H PRN [name], MD      . aspirin EC tablet 81 mg  81 mg Oral Daily [name], MD   81 mg at 04/21/14 1448  . atorvastatin (LIPITOR) tablet 40 mg  40 mg Oral q1800 [name], MD   40 mg at 04/21/14 1739  . clopidogrel (PLAVIX) tablet 75 mg  75 mg Oral Daily [name], MD   75 mg at 04/21/14 1448  . hydrALAZINE (APRESOLINE) injection 10 mg  10 mg Intravenous Once Cecilie Kicks, NP      . hydrochlorothiazide (MICROZIDE) capsule 12.5 mg  12.5 mg Oral Daily [name], MD      . isosorbide mononitrate (IMDUR) 24 hr tablet 60 mg  60 mg Oral Daily [name], MD      . levothyroxine (SYNTHROID, LEVOTHROID) tablet 112 mcg  112 mcg Oral QAC breakfast [name], MD   112 mcg at 04/22/14 0610  . metoprolol tartrate (LOPRESSOR) tablet 25 mg  25 mg Oral BID [name], MD      . nitroGLYCERIN (NITROSTAT) SL tablet 0.4 mg  0.4 mg Sublingual Q5 min PRN [name], MD      . ondansetron [facility]) injection 4 mg  4 mg Intravenous Q6H PRN [name], MD   4 mg at 04/21/14 1540  . oxyCODONE-acetaminophen (PERCOCET/ROXICET) 5-325 MG per tablet 1-2 tablet  1-2 tablet Oral Q4H  PRN [name], MD      . tamsulosin [facility]) capsule 0.4 mg  0.4 mg Oral Daily Cecilie Kicks, NP   0.4 mg at 04/21/14 1739    PE: General appearance: alert, cooperative and no distress Lungs: clear to auscultation bilaterally Heart: regular rate and rhythm and 2/6 sys MM Abdomen: +BS Pulses: 2+ and symmetric Skin: Warm and dry.  No ecchymosis or hematoma at cath site. Neurologic: Grossly normal  Lab Results:   Recent Labs  04/21/14 0700 04/22/14 0400  WBC 6.8 6.9  HGB 13.5 13.3  HCT 42.2 41.7  PLT 159 146*   BMET  Recent Labs  04/21/14 0700 04/22/14 0400  NA 141 142  K 4.2 4.7  CL 102 101  CO2 27 29  GLUCOSE 95 95  BUN 21 18  CREATININE 0.91 0.93  CALCIUM 9.3 8.9   PT/INR  Recent Labs  04/21/14 0700  LABPROT 14.5  INR 1.13      Assessment/Plan    Principal Problem:   Other and unspecified angina pectoris Active Problems:   CAD (coronary artery disease) of artery bypass graft   HTN (hypertension)   Aortic valve disorders  Chronic diastolic HF (heart failure)   HDL  Plan:   SP left heart cath revealing new high-grade obstruction in the ostium of the saphenous vein graft to the obtuse marginal.  He underwent successful bare-metal stent in the ostial/proximal SVG to the circumflex reducing a 95% stenosis to less than 25% with TIMI grade 3 flow.  ASA/Palvix.  Follow up Echo in the office.   Remove foley catheter this morning.  Will send home on flomax and followup with urologist.  BP on the low side this morning.  Some bradycardia with 2.33 sec pause during the night.  On lopressor 25.  Continue.  On lipitor.  Will ambulate prior to DC today.     LOS: 1 day    Parlee Amescua PA-C 04/22/2014 6:18 AM

## 2014-04-22 NOTE — Discharge Summary (Signed)
Physician Discharge Summary      Patient ID: Tim Campbell MRN: [id number] DOB/AGE: [birth year] 80 y.o.  Admit date: 04/21/2014 Discharge date: 04/22/2014  Admission Diagnoses:  Other and unspecified angina pectoris  Discharge Diagnoses:  Principal Problem:   Other and unspecified angina pectoris Active Problems:   CAD (coronary artery disease) of artery bypass graft   HTN (hypertension)   Aortic valve disorders   Chronic diastolic HF (heart failure)   Discharged Condition: stable  Hospital Course:   80 yo male with history of CAD s/p CABG, HTN, Hyperlipidemia, hypothyroidism, large cell non-Hodgkins lymphoma and severe aortic valve stenosis here today for evaluation for TAVR. He underwent 5V CABG in 1993. He had a NSTEMI in 2005 and was found to have occlusion of the vein graft to the Diagonal. He has since had a bare metal stent placed in the vein graft to the PDA in January 2012. Grafts to the LAD and OM were patent at that time. Last cath in March 2014 showed a 70% in stent restenosis of the SVG to the RCA, 50% proximal stenosis of the SVG to the OM, chronic occlusion of the SVG to the Diagonal, and a patent LIMA to the LAD which was diffusely diseased. His last echo in September 2014 showed severe AS with a peak velocity of 4.08 m/s, a mean aortic valve gradient of 37 mm Hg, and an AVA of 0.8 cm2. There was mild AI and mild MR with an LVEF of 45-50%. He has been followed in our office by Dr. Pernell Dupre. He has been seen by Dr. Gilford Raid in the [place] surgery office 12/16/13.     SP left heart cath revealing new high-grade obstruction in the ostium of the saphenous vein graft to the obtuse marginal. He underwent successful bare-metal stent in the ostial/proximal SVG to the circumflex reducing a 95% stenosis to less than 25% with TIMI grade 3 flow. ASA/Palvix. Follow up Echo in the office.  Some bradycardia with 2.33 sec pause during the night. On lopressor 25. Continue. Foley cath was  placed due to urinary retention.  He was started on flomax.  Followup with urologist.  The patient was seen by Dr. Tamala Julian who felt he was stable for DC home.  He will follow up with Dr. Tamala Julian and discuss further a TAVR procedure.       Consults: None  Significant Diagnostic Studies:  PERCUTANEOUS CORONARY INTERVENTION  [name] is a 80 y.o. male  INDICATION: Class III-IV angina pectoris with new high-grade obstruction in the ostium of the saphenous vein graft to the obtuse marginal. This is a staged procedure in preparation for TAVR.  PROCEDURE: 1. Coronary angiography; 2. Bare-metal stent ostium of the saphenous vein graft to the obtuse marginal with distal protection  CONSENT:  The risks, benefits, and details of the procedure were explained to the patient. Risks including death, MI, stroke, bleeding, limb ischemia, emergency CABG, renal failure and allergy were described and accepted by the patient. Informed written consent was obtained prior to proceeding.  PROCEDURE TECHNIQUE: After Xylocaine anesthesia a 6 French sheath was placed in the right femoral artery with a single anterior needle wall stick. Coronary guiding shots were made using a 6 French Amplatz Left 0.75 cm guide catheter. Antithrombotic therapy, 8000 units of IV heparin as a bolus was given and determined to have a therapeutic by ACT. Antiplatelet therapy, Plavix 150 mg, was loaded on top of chronic daily therapy. An additional 2500 units of heparin  was administered at the end of the case.  After obtaining guiding shots we used a BMW wire across the stenosis in the proximal saphenous vein graft to the obtuse marginal. We then used a 4.0 mm Spider FX distal protection device and deployed in the distal third of the vein graft. After the basket was probably deployed, and 18 mm long by 4.0 mm diameter MultiLink Vision bare-metal stent was deployed at 12 atmospheres. A persistent waist was noted. We then use a 4.0 x 12 mm long [place]  Euphora to post dilate to 16 atmospheres x2 inflations. We retrieved these Spider basket without difficulty. Final angiographic images were acceptable.  We then turned our attention to the saphenous vein graft to the right coronary. An MP A2 6 [place] guide catheter was used to obtain guiding shots of the SVG to the distal RCA. Cinefluoroscopy using 30 frames per second demonstrated that the previously placed stent extended beyond the ostium of the saphenous vein graft into the aorta by at least 5 or perhaps 6 mm. All angiographic images were outside the lumen of this graft and demonstrated a stable 50-70% ostial narrowing. The appearance is unchanged when compared to a study performed 18 months ago. I chose against stenting the right coronary as treatment would be through the struts of a previously placed stent with crush and deformation of the aortic portion of the previously placed stent. Furthermore a notable are of stent will likely increase the risk of more aggressive restenosis.  ACT postprocedure was 280 seconds and an additional 2500 units of heparin was given.  The case was terminated and hemostasis was achieved with Angio-Seal.  CONTRAST: Total of 205 cc.  COMPLICATIONS: None.  ANGIOGRAPHIC RESULTS: 95% ostial/proximal SVG to the obtuse marginal reduced to less than 20% with TIMI grade 3 flow after BM stenting using distal protection with the Spider FX device. TIMI grade 3 flow was noted. No immediate complications were noted.  IMPRESSIONS: 1. Successful bare-metal stent in the ostial/proximal SVG to the circumflex reducing a 95% stenosis to less than 25% with TIMI grade 3 flow  2. Stable 50-70% ostial stenosis of the saphenous vein graft to the distal right coronary. The previously placed stent extends from the graft ostium by at least 5-6 mm. With the lesion being stable over 16-18 months, intervention was not performed and this lesion can be treated medically.  RECOMMENDATION:  Aspirin and  Plavix will be continued  Plan discharge in a.m.  Set up 2-D Doppler echocardiogram and followup in the [facility] with Dr. Cyndia Bent.  [name], MD  04/21/2014  10:24 AM      Treatments:  See above  Discharge Exam: Blood pressure 103/46, pulse 68, temperature 97.7 F (36.5 C), temperature source Oral, resp. rate 18, height 5\' 10"  (1.778 m), weight 218 lb 14.7 oz (99.3 kg), SpO2 94.00%.   Disposition: 01-Home or Self Care      Discharge Instructions   Diet - low sodium heart healthy    Complete by:  As directed      Discharge instructions    Complete by:  As directed   No lifting more than a half gallon of milk or driving for three days.     Increase activity slowly    Complete by:  As directed             Medication List         aspirin EC 81 MG tablet  Take 81 mg by mouth daily.  atorvastatin 40 MG tablet  Commonly known as:  LIPITOR  Take 40 mg by mouth daily at 6 PM.     clopidogrel 75 MG tablet  Commonly known as:  PLAVIX  Take 75 mg by mouth daily.     GARLIC PO  Take 1 tablet by mouth every other day.     hydrochlorothiazide 12.5 MG capsule  Commonly known as:  MICROZIDE  Take 12.5 mg by mouth daily.     isosorbide mononitrate 60 MG 24 hr tablet  Commonly known as:  IMDUR  Take 60 mg by mouth daily.     levothyroxine 112 MCG tablet  Commonly known as:  SYNTHROID, LEVOTHROID  Take 112 mcg by mouth daily.     metoprolol tartrate 25 MG tablet  Commonly known as:  LOPRESSOR  Take 25 mg by mouth 2 (two) times daily.     NIACIN CR PO  Take 1 tablet by mouth 2 (two) times daily.     nitroGLYCERIN 0.4 MG SL tablet  Commonly known as:  NITROSTAT  Place 0.4 mg under the tongue every 5 (five) minutes as needed for chest pain.     OVER THE COUNTER MEDICATION  Take 1 tablet by mouth daily. Prostate essentials supplement     SELENIUM PO  Take 1 tablet by mouth daily.     tamsulosin 0.4 MG Caps capsule  Commonly known as:  FLOMAX    Take 1 capsule (0.4 mg total) by mouth daily.     VITAMIN B 12 PO  Take 1 tablet by mouth daily.     VITAMIN D (ERGOCALCIFEROL) PO  Take 1 tablet by mouth daily. Patient takes 3-4 times weekly       Follow-up Information   Follow up with [name], MD On 06/17/2014. (1:15 PM)    Specialty:  Cardiology   Contact information:   [address] [place] [address] [phone]       Follow up with [facility] (Call for an appointment.)    Contact information:   [place] [address] [phone]      Greater than 30 minutes was spent completing the patient's discharge.    SignedTarri Fuller, [place] 04/22/2014, 9:39 AM

## 2014-04-22 NOTE — Progress Notes (Signed)
Pt walked about 1000 ft independently. Sts he felt well, no sx. Ed completed. Pt is resistant to carry his NTG with him. Not interested in CRPII. Works in the garden daily. [place] CES, ACSM 9:52 AM 04/22/2014

## 2014-04-23 NOTE — Progress Notes (Signed)
Ready for Discharge

## 2014-04-28 ENCOUNTER — Ambulatory Visit (HOSPITAL_COMMUNITY): Payer: Medicare Other | Attending: Cardiovascular Disease | Admitting: Cardiology

## 2014-04-28 ENCOUNTER — Telehealth: Payer: Self-pay | Admitting: Interventional Cardiology

## 2014-04-28 DIAGNOSIS — I517 Cardiomegaly: Secondary | ICD-10-CM | POA: Diagnosis not present

## 2014-04-28 DIAGNOSIS — R0609 Other forms of dyspnea: Secondary | ICD-10-CM | POA: Diagnosis not present

## 2014-04-28 DIAGNOSIS — I4510 Unspecified right bundle-branch block: Secondary | ICD-10-CM | POA: Diagnosis not present

## 2014-04-28 DIAGNOSIS — Z87891 Personal history of nicotine dependence: Secondary | ICD-10-CM | POA: Insufficient documentation

## 2014-04-28 DIAGNOSIS — I059 Rheumatic mitral valve disease, unspecified: Secondary | ICD-10-CM | POA: Diagnosis not present

## 2014-04-28 DIAGNOSIS — I359 Nonrheumatic aortic valve disorder, unspecified: Secondary | ICD-10-CM | POA: Diagnosis not present

## 2014-04-28 DIAGNOSIS — I10 Essential (primary) hypertension: Secondary | ICD-10-CM | POA: Insufficient documentation

## 2014-04-28 DIAGNOSIS — R0989 Other specified symptoms and signs involving the circulatory and respiratory systems: Secondary | ICD-10-CM | POA: Insufficient documentation

## 2014-04-28 DIAGNOSIS — I079 Rheumatic tricuspid valve disease, unspecified: Secondary | ICD-10-CM | POA: Insufficient documentation

## 2014-04-28 DIAGNOSIS — I252 Old myocardial infarction: Secondary | ICD-10-CM | POA: Insufficient documentation

## 2014-04-28 DIAGNOSIS — I2510 Atherosclerotic heart disease of native coronary artery without angina pectoris: Secondary | ICD-10-CM | POA: Diagnosis not present

## 2014-04-28 DIAGNOSIS — E785 Hyperlipidemia, unspecified: Secondary | ICD-10-CM | POA: Diagnosis not present

## 2014-04-28 NOTE — Telephone Encounter (Signed)
Walk In Pt Form " Left Message For Jeanell Sparrow Back On Thursday 7.22.15/km

## 2014-04-28 NOTE — Progress Notes (Signed)
Echo performed. 

## 2014-05-06 ENCOUNTER — Telehealth: Payer: Self-pay

## 2014-05-12 ENCOUNTER — Ambulatory Visit (INDEPENDENT_AMBULATORY_CARE_PROVIDER_SITE_OTHER): Payer: Medicare Other | Admitting: Surgery

## 2014-05-12 ENCOUNTER — Encounter: Payer: Self-pay | Admitting: Surgery

## 2014-05-12 VITALS — BP 100/53 | HR 54 | Resp 16 | Ht 70.0 in | Wt 223.0 lb

## 2014-05-12 DIAGNOSIS — I359 Nonrheumatic aortic valve disorder, unspecified: Secondary | ICD-10-CM

## 2014-05-12 DIAGNOSIS — I350 Nonrheumatic aortic (valve) stenosis: Secondary | ICD-10-CM

## 2014-05-12 NOTE — Progress Notes (Signed)
HPI:  The patient returns today for follow up of his severe aortic stenosis. Since I first saw him in March for TAVR evaluation he underwent catheterization showing a severe stenosis in the proximal part of the OM SVG. There was also a moderate stenosis in the ostium and proximal segment of the SVG to the PDA/PL within the stented segment. The aortic valve peak to peak gradient was 25 mm Hg and the mean was 20 mm Hg, lower than the gradients measured by echo. The AVA was calculated at 1.23 cm2. Since there seemed to be a significant anginal component to his symptoms he underwent stenting of the OM vein graft stenosis on 7/15 with a good result. The SVG to the PDA/PL had a stable ostial and proximal narrowing and was not intervened on. He says he no longer has any chest pain and feels better than he did. He still gets SOB with significant exertion but his family says he has adjusted his activity level to avoid symptoms. They have noticed that he is less active than last year. He says he feels fine.  Current Outpatient Prescriptions  Medication Sig Dispense Refill  . aspirin EC 81 MG tablet Take 81 mg by mouth daily.      Marland Kitchen atorvastatin (LIPITOR) 40 MG tablet Take 40 mg by mouth daily at 6 PM.      . clopidogrel (PLAVIX) 75 MG tablet Take 75 mg by mouth daily.      . Cyanocobalamin (VITAMIN B 12 PO) Take 1 tablet by mouth daily.      Marland Kitchen GARLIC PO Take 1 tablet by mouth every other day.       . hydrochlorothiazide (MICROZIDE) 12.5 MG capsule Take 12.5 mg by mouth daily.      . isosorbide mononitrate (IMDUR) 60 MG 24 hr tablet Take 60 mg by mouth daily.      Marland Kitchen levothyroxine (SYNTHROID, LEVOTHROID) 112 MCG tablet Take 112 mcg by mouth daily.      . metoprolol tartrate (LOPRESSOR) 25 MG tablet Take 25 mg by mouth 2 (two) times daily.      Marland Kitchen NIACIN CR PO Take 1 tablet by mouth 2 (two) times daily.      . nitroGLYCERIN (NITROSTAT) 0.4 MG SL tablet Place 0.4 mg under the tongue every 5 (five) minutes  as needed for chest pain.      Marland Kitchen OVER THE COUNTER MEDICATION Take 1 tablet by mouth daily. Prostate essentials supplement       . SELENIUM PO Take 1 tablet by mouth daily.        . tamsulosin (FLOMAX) 0.4 MG CAPS capsule Take 1 capsule (0.4 mg total) by mouth daily.  30 capsule  1  . VITAMIN D, ERGOCALCIFEROL, PO Take 1 tablet by mouth daily. Patient takes 3-4 times weekly       No current facility-administered medications for this visit.     Physical Exam: BP 100/53  Pulse 54  Resp 16  Ht 5\' 10"  (1.778 m)  Wt 223 lb (101.152 kg)  BMI 32.00 kg/m2  SpO2 98% He looks well Lung exam is clear Cardiac exam shows a regular rate and rhythm with a harsh 3/6 systolic murmur along the RSB. There is no lower extremity edema.  Diagnostic Tests:  [facility] [address] [place], [place] [address] [phone]  ------------------------------------------------------------------- Transthoracic Echocardiography  Patient: Adrienne, Delay MR #: [id number] Study Date: 04/28/2014 Gender: M Age: 80 Height: 177.8 cm  Weight: 103 kg BSA: 2.29 m^2 Pt. Status: Room:  SONOGRAPHER Oletta Lamas, Will ATTENDING McAlhany, Gilmore Laroche, Christian, Outpatient  cc:  ------------------------------------------------------------------- LV EF: 40% - 45%  ------------------------------------------------------------------- Indications: 424.1 Aortic valve disorders.  ------------------------------------------------------------------- History: PMH: RBBB. Acquired from the patient and from the patient&'s chart. Dyspnea. Coronary artery disease. PMH: Myocardial infarction. Risk factors: Former tobacco use. Hypertension. Dyslipidemia.  ------------------------------------------------------------------- Study Conclusions  - Left ventricle: The cavity size was mildly dilated. There was moderate concentric hypertrophy.  Systolic function was mildly to moderately reduced. The estimated ejection fraction was in the range of 40% to 45%. Severe hypokinesis of the basalinferolateral and inferior myocardium. - Aortic valve: There was moderate stenosis. There was mild to moderate regurgitation. - Mitral valve: Calcified annulus. There was mild regurgitation. - Left atrium: The atrium was severely dilated.  ------------------------------------------------------------------- Labs, prior tests, procedures, and surgery: Coronary artery bypass grafting.  Transthoracic echocardiography. M-mode, complete 2D, spectral Doppler, and color Doppler. Birthdate: Patient birthdate: Dec 10, 1925. Age: Patient is 80 yr old. Sex: Gender: male. Height: Height: 177.8 cm. Height: 70 in. Weight: Weight: 103 kg. Weight: 226.5 lb. Body mass index: BMI: 32.6 kg/m^2. Body surface area: BSA: 2.29 m^2. Blood pressure: 108/50 Patient status: Outpatient. Study date: Study date: 04/28/2014. Study time: 01:11 PM. Location:  [facility]  -------------------------------------------------------------------  ------------------------------------------------------------------- Left ventricle: The cavity size was mildly dilated. There was moderate concentric hypertrophy. Systolic function was mildly to moderately reduced. The estimated ejection fraction was in the range of 40% to 45%. Regional wall motion abnormalities: Severe hypokinesis of the basalinferolateral and inferior myocardium. There was no evidence of elevated ventricular filling pressure by Doppler parameters. Marked bradycardia limits mitral inflow evaluation.  ------------------------------------------------------------------- Aortic valve: Trileaflet; mildly thickened, mildly calcified leaflets. Doppler: There was moderate stenosis. There was mild to moderate regurgitation. VTI ratio of LVOT to aortic valve: 0.17. Valve area (VTI): 0.75 cm^2. Indexed valve area (VTI):  0.33 cm^2/m^2. Valve area (Vmax): 0.78 cm^2. Indexed valve area (Vmax): 0.34 cm^2/m^2. Mean gradient (S): 36 mm Hg. Peak gradient (S): 61 mm Hg.  ------------------------------------------------------------------- Aorta: Aortic root: The aortic root was normal in size. Ascending aorta: The ascending aorta was normal in size.  ------------------------------------------------------------------- Mitral valve: Calcified annulus. Leaflet separation was normal. Doppler: Transvalvular velocity was within the normal range. There was no evidence for stenosis. There was mild regurgitation. Peak gradient (D): 2 mm Hg.  ------------------------------------------------------------------- Left atrium: The atrium was severely dilated.  ------------------------------------------------------------------- Right ventricle: The cavity size was normal. Wall thickness was normal. Systolic function was normal.  ------------------------------------------------------------------- Pulmonic valve: Structurally normal valve. Cusp separation was normal. Doppler: Transvalvular velocity was within the normal range. There was mild regurgitation.  ------------------------------------------------------------------- Tricuspid valve: Structurally normal valve. Leaflet separation was normal. Doppler: Transvalvular velocity was within the normal range. There was mild regurgitation.  ------------------------------------------------------------------- Pulmonary artery: Systolic pressure was within the normal range.  ------------------------------------------------------------------- Right atrium: The atrium was normal in size.  ------------------------------------------------------------------- Pericardium: There was no pericardial effusion.  ------------------------------------------------------------------- Prepared and Electronically Authenticated by  Sanda Klein,  MD 2015-07-22T15:22:55  ------------------------------------------------------------------- Measurements  Left ventricle Value Reference LV ID, ED, PLAX chordal (H) 56.5 mm 43 - 52 LV ID, ES, PLAX chordal (H) 44.6 mm 23 - 38 LV fx shortening, PLAX chordal (L) 21 % >=29 LV PW thickness, ED 13.7 mm --------- IVS/LV PW ratio, ED (N) 1.01 <=1.3 Stroke volume, 2D 75 ml --------- Stroke volume/bsa, 2D 33 ml/m^2 --------- LV e&', lateral 9.26 cm/s --------- LV E/e&',  lateral 8.37 --------- LV e&', medial 6.14 cm/s --------- LV E/e&', medial 12.62 --------- LV e&', average 7.7 cm/s --------- LV E/e&', average 10.06 ---------  Ventricular septum Value Reference IVS thickness, ED 13.9 mm ---------  LVOT Value Reference LVOT ID, S 24 mm --------- LVOT area 4.52 cm^2 --------- LVOT ID 24 mm --------- LVOT VTI, S 16.7 cm --------- Stroke volume (SV), LVOT DP 75.5 ml --------- Stroke index (SV/bsa), LVOT DP 33 ml/m^2 ---------  Aortic valve Value Reference Aortic valve peak velocity, S 390 cm/s --------- Aortic valve mean velocity, S 278 cm/s --------- Aortic valve VTI, S 101 cm --------- Aortic mean gradient, S 36 mm Hg --------- Aortic peak gradient, S 61 mm Hg --------- VTI ratio, LVOT/AV 0.17 --------- Aortic valve area, VTI 0.75 cm^2 --------- Aortic valve area/bsa, VTI 0.33 cm^2/m^2 --------- Aortic valve area, peak velocity 0.78 cm^2 --------- Aortic valve area/bsa, peak 0.34 cm^2/m^2 --------- velocity Aortic regurg pressure half-time 613 ms ---------  Aorta Value Reference Aortic root ID, ED 40 mm --------- Ascending aorta ID, A-P, S 37 mm ---------  Left atrium Value Reference LA ID, A-P, ES 59 mm --------- LA ID/bsa, A-P (H) 2.58 cm/m^2 <=2.2  Mitral valve Value Reference Mitral E-wave peak velocity 77.5 cm/s --------- Mitral A-wave peak velocity 93.3 cm/s --------- Mitral deceleration time (H) 261 ms 150 - 230 Mitral peak gradient, D 2 mm Hg --------- Mitral  E/A ratio, peak 0.8 ---------  Pulmonary arteries Value Reference PA pressure, S, DP (N) 22 mm Hg <=30  Tricuspid valve Value Reference Tricuspid regurg peak velocity 216 cm/s --------- Tricuspid peak RV-RA gradient 19 mm Hg --------- Tricuspid maximal regurg 216 cm/s --------- velocity, PISA  Systemic veins Value Reference Estimated CVP 3 mm Hg ---------  Right ventricle Value Reference RV pressure, S, DP (N) 22 mm Hg <=30 RV s&', lateral, S 8.77 cm/s ---------  Legend: (L) and (H) mark values outside specified reference range.  (N) marks values inside specified reference range.   Impression:  He seems symptomatically improved after PCI of the OM SVG but does have severe AS by echo. His recent echo shows a slightly lower mean gradient of 36 and a peak of 61 but the valve area is the same at 0.75 cm2. I suspect that he will progressively become more symptomatic but feels ok now and would like to continue following his AS. His EF is moderately reduced at 45% but stable. I think he probably not having much symptoms now because he has reduced his activity and is resting more.  Plan:  I will plan to see him back in 3 months to see how he is doing. He will call us if he notices that his symptoms are worsening.

## 2014-06-04 DIAGNOSIS — M2060 Acquired deformities of toe(s), unspecified, unspecified foot: Secondary | ICD-10-CM | POA: Diagnosis not present

## 2014-06-04 DIAGNOSIS — L608 Other nail disorders: Secondary | ICD-10-CM | POA: Diagnosis not present

## 2014-06-04 DIAGNOSIS — S90129A Contusion of unspecified lesser toe(s) without damage to nail, initial encounter: Secondary | ICD-10-CM | POA: Diagnosis not present

## 2014-06-10 NOTE — Telephone Encounter (Signed)
error 

## 2014-06-17 ENCOUNTER — Ambulatory Visit (INDEPENDENT_AMBULATORY_CARE_PROVIDER_SITE_OTHER): Payer: Medicare Other | Admitting: Interventional Cardiology

## 2014-06-17 ENCOUNTER — Encounter: Payer: Self-pay | Admitting: Interventional Cardiology

## 2014-06-17 ENCOUNTER — Telehealth: Payer: Self-pay

## 2014-06-17 VITALS — BP 104/50 | HR 62 | Ht 70.0 in | Wt 221.0 lb

## 2014-06-17 DIAGNOSIS — I5032 Chronic diastolic (congestive) heart failure: Secondary | ICD-10-CM | POA: Diagnosis not present

## 2014-06-17 DIAGNOSIS — I359 Nonrheumatic aortic valve disorder, unspecified: Secondary | ICD-10-CM | POA: Diagnosis not present

## 2014-06-17 DIAGNOSIS — I25810 Atherosclerosis of coronary artery bypass graft(s) without angina pectoris: Secondary | ICD-10-CM | POA: Diagnosis not present

## 2014-06-17 DIAGNOSIS — E785 Hyperlipidemia, unspecified: Secondary | ICD-10-CM

## 2014-06-17 DIAGNOSIS — I350 Nonrheumatic aortic (valve) stenosis: Secondary | ICD-10-CM

## 2014-06-17 DIAGNOSIS — I10 Essential (primary) hypertension: Secondary | ICD-10-CM | POA: Diagnosis not present

## 2014-06-17 NOTE — Telephone Encounter (Signed)
lmom for pt pt daughter .pt sch for appt to day @ 1:15pm with Dr.Smith. pt should come at 1:30pm (appt time change)

## 2014-06-17 NOTE — Progress Notes (Signed)
Patient ID: Tim Campbell, male   DOB: 12-24-1925, 80 y.o.   MRN: [id number]    [address], [place]  [address] Phone: [phone] Fax:  [phone]  Date:  06/17/2014   ID:  Tim Campbell, DOB 17-Oct-1925, MRN [id number]  PCP:  [name], MD   ASSESSMENT:  1. Severe aortic stenosis, symptomatic. The patient is significantly decreased exertional activities. The current plan is clinical followup with potential TAVR when the patient decides. He is given up walking and working in the chart. He drives around on the scooter. 2. Coronary artery disease with recent saphenous vein graft stenting for ISR. No recurrence of angina 3. Chronic combined systolic and diastolic heart failure, symptomatic with reduced physical activity 4. Essential hypertension 5. Hyperlipidemia   PLAN:  1. Long discussion with the patient concerning treatment options. We discussed the low but definite sudden death risk associated with critical aortic stenosis. At the current time the patient prefers to simply monitor his symptoms and if he gets worse, i.e. symptoms at rest, he will consider valve therapy. 2. Call if angina 3. Keep appointment with Dr. Cyndia Bent in 3 months 4. Clinical followup in cardiology office in 6 months   SUBJECTIVE: Tim Campbell is a 80 y.o. male who underwent successful saphenous vein graft stenting earlier this year. He has critical aortic stenosis. He is being considered for Taber. After angioplasty the goal was to see if PCI improved as exertional tolerance and symptoms. The patient basically has become much more sedentary. He no longer attempts to walk around in his shard or to do yard work. When he does get out he uses a scooter. He has not used nitroglycerin. According to his daughter and wife he is significantly curtail his exertional activities. He has not have angina at rest. He denies shortness of breath at rest. He has not noticed swelling. He  has not had syncope.   Wt Readings from Last 3 Encounters:  06/17/14 221 lb (100.245 kg)  05/12/14 223 lb (101.152 kg)  04/22/14 218 lb 14.7 oz (99.3 kg)     Past Medical History  Diagnosis Date  . Coronary artery disease     with occluded SVG to diagonal and BMS to prox SVG of RCA 10/2010. Recth 12/2012, 70% ISR SVG to RCA, 50% SVG to Cfx, 100% SVG to diagonal, and Patent LIMA with diffuse LAD dz.  . Hypertension   . Non-ST elevation MI (NSTEMI)   . Hyperlipidemia   . Insomnia   . Vitamin D deficiency   . Aortic stenosis     severe by echo September 2014  . Hypothyroidism   . RBBB   . Diverticulosis   . Non Hodgkin's lymphoma   . Enlarged prostate     per patient  with mild effect on urine stream  . Arthritis     osteo knees "     Current Outpatient Prescriptions  Medication Sig Dispense Refill  . aspirin EC 81 MG tablet Take 81 mg by mouth daily.      Marland Kitchen atorvastatin (LIPITOR) 40 MG tablet Take 40 mg by mouth daily at 6 PM.      . clopidogrel (PLAVIX) 75 MG tablet Take 75 mg by mouth daily.      . Cyanocobalamin (VITAMIN B 12 PO) Take 1 tablet by mouth daily.      Marland Kitchen GARLIC PO Take 1 tablet by mouth every other day.       Marland Kitchen  hydrochlorothiazide (MICROZIDE) 12.5 MG capsule Take 12.5 mg by mouth daily.      . isosorbide mononitrate (IMDUR) 60 MG 24 hr tablet Take 60 mg by mouth daily.      Marland Kitchen levothyroxine (SYNTHROID, LEVOTHROID) 112 MCG tablet Take 112 mcg by mouth daily.      . metoprolol tartrate (LOPRESSOR) 25 MG tablet Take 25 mg by mouth 2 (two) times daily.      . nitroGLYCERIN (NITROSTAT) 0.4 MG SL tablet Place 0.4 mg under the tongue every 5 (five) minutes as needed for chest pain.      Marland Kitchen OVER THE COUNTER MEDICATION Take 1 tablet by mouth daily. Prostate essentials supplement       . SELENIUM PO Take 1 tablet by mouth daily.        . tamsulosin (FLOMAX) 0.4 MG CAPS capsule Take 1 capsule (0.4 mg total) by mouth daily.  30 capsule  1  . VITAMIN D, ERGOCALCIFEROL, PO  Take 1 tablet by mouth daily. Patient takes 3-4 times weekly       No current facility-administered medications for this visit.    Allergies:   No Known Allergies  Social History:  The patient  reports that he has quit smoking. His smoking use included Cigarettes. He has a 7.5 pack-year smoking history. He has never used smokeless tobacco. He reports that he does not drink alcohol or use illicit drugs.   ROS:  Please see the history of present illness.   No transient neurological symptoms. No nitroglycerin use. Noncompliant with medical regimen having stopped several medications at were started by his various doctors.   All other systems reviewed and negative.   OBJECTIVE: VS:  BP 104/50  Pulse 62  Ht 5\' 10"  (1.778 m)  Wt 221 lb (100.245 kg)  BMI 31.71 kg/m2 Well nourished, well developed, in no acute distress, elderly but younger than stated age. HEENT: normal Neck: JVD flat. Carotid bruit bilateral transmitted from the aortic valve  Cardiac:  normal S1, S2; RRR; 3-4/6 crescendo decrescendo murmur Lungs:  clear to auscultation bilaterally, no wheezing, rhonchi or rales Abd: soft, nontender, no hepatomegaly Ext: Edema none. Pulses 2+ Skin: warm and dry Neuro:  CNs 2-12 intact, no focal abnormalities noted  EKG:  Not performed       Signed, Illene Labrador III, MD 06/17/2014 6:43 PM

## 2014-06-17 NOTE — Patient Instructions (Signed)
Your physician recommends that you continue on your current medications as directed. Please refer to the Current Medication list given to you today.  Please call if you are experiencing Chest pain, shortness of breath, near fainting or fainting  Your physician wants you to follow-up in: 6 months with Dr.Smith You will receive a reminder letter in the mail two months in advance. If you don't receive a letter, please call our office to schedule the follow-up appointment.

## 2014-06-24 ENCOUNTER — Other Ambulatory Visit (HOSPITAL_COMMUNITY): Payer: Self-pay | Admitting: Cardiology

## 2014-08-11 ENCOUNTER — Encounter: Payer: Self-pay | Admitting: Surgery

## 2014-08-11 ENCOUNTER — Encounter: Payer: Medicare Other | Admitting: Surgery

## 2014-08-11 ENCOUNTER — Ambulatory Visit (INDEPENDENT_AMBULATORY_CARE_PROVIDER_SITE_OTHER): Payer: Medicare Other | Admitting: Surgery

## 2014-08-11 VITALS — BP 95/52 | HR 54 | Ht 70.0 in | Wt 228.0 lb

## 2014-08-11 DIAGNOSIS — I350 Nonrheumatic aortic (valve) stenosis: Secondary | ICD-10-CM

## 2014-08-11 DIAGNOSIS — I25810 Atherosclerosis of coronary artery bypass graft(s) without angina pectoris: Secondary | ICD-10-CM | POA: Diagnosis not present

## 2014-08-12 ENCOUNTER — Encounter: Payer: Self-pay | Admitting: Surgery

## 2014-08-12 NOTE — Progress Notes (Signed)
HPI:  The patient returns to day for follow up of severe aortic stenosis. His last echo on 04/28/2014 showed a mean gradient of 36 mm Hg and a peak of 61 mm Hg with an EF of 40-45%. This had no changed since his prior echo on 06/26/2013. Since I first saw him in March for TAVR evaluation he underwent catheterization showing a severe stenosis in the proximal part of the OM SVG. There was also a moderate stenosis in the ostium and proximal segment of the SVG to the PDA/PL within the stented segment. The aortic valve peak to peak gradient was 25 mm Hg and the mean was 20 mm Hg, lower than the gradients measured by echo. The AVA was calculated at 1.23 cm2. Since there seemed to be a significant anginal component to his symptoms he underwent stenting of the OM vein graft stenosis on 7/15 with a good result. The SVG to the PDA/PL had a stable ostial and proximal narrowing and was not intervened on. When I last saw him on 05/12/2014 he said he no longer had any chest pain and felt better than he did before the PCI. He still had some exertional dyspnea but had decreased his activity so that would not occur. He wanted to continue following this. Since then he says he is about the same and feels well. He denies any chest pain. He can walk to the mailbox but had to stop to rest. He says he is doing what he wants to do. His wife and daughter are with him today and they feel that he had significantly decreased his activity over the past year and are very concerned about that. I sense some disagreement about how well he is doing between the patient and his family.  Current Outpatient Prescriptions  Medication Sig Dispense Refill  . aspirin EC 81 MG tablet Take 81 mg by mouth daily.    Marland Kitchen atorvastatin (LIPITOR) 40 MG tablet Take 40 mg by mouth daily at 6 PM.    . clopidogrel (PLAVIX) 75 MG tablet Take 75 mg by mouth daily.    . Cyanocobalamin (VITAMIN B 12 PO) Take 1 tablet by mouth daily.    Marland Kitchen GARLIC PO Take 1  tablet by mouth every other day.     . hydrochlorothiazide (MICROZIDE) 12.5 MG capsule Take 12.5 mg by mouth daily.    . isosorbide mononitrate (IMDUR) 60 MG 24 hr tablet Take 60 mg by mouth daily.    Marland Kitchen levothyroxine (SYNTHROID, LEVOTHROID) 112 MCG tablet Take 112 mcg by mouth daily.    . metoprolol tartrate (LOPRESSOR) 25 MG tablet Take 25 mg by mouth 2 (two) times daily.    . nitroGLYCERIN (NITROSTAT) 0.4 MG SL tablet Place 0.4 mg under the tongue every 5 (five) minutes as needed for chest pain.    Marland Kitchen OVER THE COUNTER MEDICATION Take 1 tablet by mouth daily. Prostate essentials supplement     . SELENIUM PO Take 1 tablet by mouth daily.      . tamsulosin (FLOMAX) 0.4 MG CAPS capsule Take 1 capsule (0.4 mg total) by mouth daily. 30 capsule 1  . VITAMIN D, ERGOCALCIFEROL, PO Take 1 tablet by mouth daily. Patient takes 3-4 times weekly     No current facility-administered medications for this visit.     Physical Exam: BP 95/52 mmHg  Pulse 54  Ht 5\' 10"  (1.778 m)  Wt 228 lb (103.42 kg)  BMI 32.71 kg/m2  SpO2 95% He looks  well Lung exam is clear Cardiac exam shows a regular rate and rhythm with a harsh 3/6 systolic murmur along the RSB. There is no lower extremity edema  Impression:  He has moderate aortic stenosis by the transvalvular gradient measured on his prior echos and at cath. I have reviewed the last echo myself and he has fair leaflet motion and mildly calcified leaflets. He says he feels fine with his current activity level. I have recommended continued close follow up and I will see him in 3 months with a repeat echo. I reviewed the symptoms of worsening aortic stenosis and congestive heart failure with the patient and his family. They will let us know if he develops any change in his symptoms.  Plan:  Return in 3 months with a 2D echo.

## 2014-09-16 ENCOUNTER — Encounter (HOSPITAL_COMMUNITY): Payer: Self-pay | Admitting: Interventional Cardiology

## 2014-10-06 ENCOUNTER — Other Ambulatory Visit: Payer: Self-pay | Admitting: Interventional Cardiology

## 2014-10-06 MED ORDER — HYDROCHLOROTHIAZIDE 12.5 MG PO CAPS: 12.5000 mg | ORAL_CAPSULE | Freq: Every day | ORAL | Status: DC

## 2014-10-12 ENCOUNTER — Other Ambulatory Visit: Payer: Self-pay | Admitting: *Deleted

## 2014-10-12 DIAGNOSIS — I350 Nonrheumatic aortic (valve) stenosis: Secondary | ICD-10-CM

## 2014-11-11 ENCOUNTER — Telehealth: Payer: Self-pay | Admitting: *Deleted

## 2014-11-11 NOTE — Telephone Encounter (Signed)
On 11/10/14, Tim Campbell's daughter called to say he wanted to cancel his scheduled ECHO and f/u appointmen t with Dr. Cyndia Bent to reassess his aortic stenosis and options for surgery.  To verify this, I called Tim Campbell and he confirmed this decision and said he would continue to see his cardiologist.  I sent Dr. Tamala Julian a message regarding same and he acknowledged receipt .

## 2014-11-22 ENCOUNTER — Ambulatory Visit (HOSPITAL_COMMUNITY): Payer: Medicare Other

## 2014-12-01 ENCOUNTER — Ambulatory Visit: Payer: Medicare Other | Admitting: Surgery

## 2015-01-05 ENCOUNTER — Ambulatory Visit (INDEPENDENT_AMBULATORY_CARE_PROVIDER_SITE_OTHER): Payer: Medicare Other | Admitting: Interventional Cardiology

## 2015-01-05 ENCOUNTER — Encounter: Payer: Self-pay | Admitting: Interventional Cardiology

## 2015-01-05 VITALS — BP 128/60 | HR 58 | Ht 70.0 in | Wt 227.0 lb

## 2015-01-05 DIAGNOSIS — I25709 Atherosclerosis of coronary artery bypass graft(s), unspecified, with unspecified angina pectoris: Secondary | ICD-10-CM

## 2015-01-05 DIAGNOSIS — I350 Nonrheumatic aortic (valve) stenosis: Secondary | ICD-10-CM | POA: Diagnosis not present

## 2015-01-05 DIAGNOSIS — E785 Hyperlipidemia, unspecified: Secondary | ICD-10-CM

## 2015-01-05 DIAGNOSIS — I5042 Chronic combined systolic (congestive) and diastolic (congestive) heart failure: Secondary | ICD-10-CM | POA: Diagnosis not present

## 2015-01-05 DIAGNOSIS — I10 Essential (primary) hypertension: Secondary | ICD-10-CM

## 2015-01-05 MED ORDER — NITROGLYCERIN 0.4 MG SL SUBL: 0.4000 mg | SUBLINGUAL_TABLET | SUBLINGUAL | Status: DC | PRN

## 2015-01-05 NOTE — Patient Instructions (Signed)
Your physician wants you to follow-up in: 6 months with Dr. Gaspar Bidding will receive a reminder letter in the mail two months in advance. If you don't receive a letter, please call our office to schedule the follow-up appointment.  Your physician recommends that you continue on your current medications as directed. Please refer to the Current Medication list given to you today.

## 2015-01-05 NOTE — Progress Notes (Signed)
Cardiology Office Note   Date:  01/05/2015   ID:  FLAVIO LINDROTH, DOB 1926/02/19, MRN [id number]  PCP:  [name], MD  Cardiologist:   [name], MD   No chief complaint on file.     History of Present Illness: Tim Campbell is a 81 y.o. male who presents for severe aortic stenosis and coronary artery disease. The patient was being seen initially in the valve clinic but he has decided against any intervention at his age. He and the family state that they are happy with medical therapy only.  He denies orthopnea, angina, and syncope. He does have cough. There is no PND. He denies peripheral edema.    Past Medical History  Diagnosis Date  . Coronary artery disease     with occluded SVG to diagonal and BMS to prox SVG of RCA 10/2010. Recth 12/2012, 70% ISR SVG to RCA, 50% SVG to Cfx, 100% SVG to diagonal, and Patent LIMA with diffuse LAD dz.  . Hypertension   . Non-ST elevation MI (NSTEMI)   . Hyperlipidemia   . Insomnia   . Vitamin D deficiency   . Aortic stenosis     severe by echo September 2014  . Hypothyroidism   . RBBB   . Diverticulosis   . Non Hodgkin's lymphoma   . Enlarged prostate     per patient  with mild effect on urine stream  . Arthritis     osteo knees "   . Chronic combined systolic and diastolic HF (heart failure), NYHA class 3 09/09/2013    LVEF 45%.     Past Surgical History  Procedure Laterality Date  . Ankle surgery    . Coronary artery bypass graft  1993  . Hemorrhoid surgery    . Right inguinal hernia repair    . Coronary stent placement  04/21/2014    bare metal SVG   [facility]  . Knee arthroscopy Right 1970'S   . Left heart catheterization with coronary/graft angiogram N/A 01/02/2013    Procedure: LEFT HEART CATHETERIZATION WITH [name];  Surgeon: [name], MD;  [facility] CATH LAB;  Service: Cardiovascular;  Laterality: N/A;  . Left and right heart catheterization with coronary/graft  angiogram N/A 04/05/2014    Procedure: LEFT AND RIGHT HEART CATHETERIZATION WITH [name];  Surgeon: Burnell Blanks, MD;  Location: [facility] CATH LAB;  Service: Cardiovascular;  Laterality: N/A;  . Percutaneous coronary stent intervention (pci-s) N/A 04/21/2014    Procedure: PERCUTANEOUS CORONARY STENT INTERVENTION (PCI-S);  Surgeon: [name], MD;  Location: [facility] CATH LAB;  Service: Cardiovascular;  Laterality: N/A;     Current Outpatient Prescriptions  Medication Sig Dispense Refill  . aspirin EC 81 MG tablet Take 81 mg by mouth daily.    Marland Kitchen atorvastatin (LIPITOR) 40 MG tablet Take 40 mg by mouth daily at 6 PM.    . clopidogrel (PLAVIX) 75 MG tablet Take 75 mg by mouth daily.    . Cyanocobalamin (VITAMIN B 12 PO) Take 1 tablet by mouth daily.    Marland Kitchen GARLIC PO Take 1 tablet by mouth every other day.     . hydrochlorothiazide (MICROZIDE) 12.5 MG capsule Take 1 capsule (12.5 mg total) by mouth daily. 30 capsule 6  . isosorbide mononitrate (IMDUR) 60 MG 24 hr tablet Take 60 mg by mouth daily.    Marland Kitchen levothyroxine (SYNTHROID, LEVOTHROID) 125 MCG tablet Take 125 mcg by mouth daily before breakfast.  3  . metoprolol tartrate (LOPRESSOR) 25 MG tablet Take 25 mg by mouth 2 (two) times daily.    . nitroGLYCERIN (NITROSTAT) 0.4 MG SL tablet Place 0.4 mg under the tongue every 5 (five) minutes as needed for chest pain.    Marland Kitchen OVER THE COUNTER MEDICATION Take 1 tablet by mouth daily. Prostate essentials supplement     . SELENIUM PO Take 1 tablet by mouth daily.      . tamsulosin (FLOMAX) 0.4 MG CAPS capsule Take 1 capsule (0.4 mg total) by mouth daily. 30 capsule 1  . VITAMIN D, ERGOCALCIFEROL, PO Take 1 tablet by mouth daily. Patient takes 3-4 times weekly     No current facility-administered medications for this visit.    Allergies:   Review of patient's allergies indicates no known allergies.    Social History:  The patient  reports that he has quit smoking. His smoking use  included Cigarettes. He has a 7.5 pack-year smoking history. He has never used smokeless tobacco. He reports that he does not drink alcohol or use illicit drugs.   Family History:  The patient's family history includes CAD in his brother; Colon cancer in his father; Stroke in his mother.    ROS:  Please see the history of present illness.   Otherwise, review of systems are positive for occasional nitroglycerin use.   All other systems are reviewed and negative.    PHYSICAL EXAM: VS:  BP 128/60 mmHg  Pulse 58  Ht 5\' 10"  (1.778 m)  Wt 227 lb (102.967 kg)  BMI 32.57 kg/m2 , BMI Body mass index is 32.57 kg/(m^2). GEN: Well nourished, well developed, in no acute distress HEENT: normal Neck: no JVD, carotid bruits, or masses Cardiac: RRR: 4/6 cores crescendo decrescendo systolic murmur at right upper sternal border and left midsternal border. There is no rub, or gallops. There is trace bilateral lower extremity  edema  Respiratory:  clear to auscultation bilaterally, normal work of breathing GI: soft, nontender, nondistended, + BS MS: no deformity or atrophy Skin: warm and dry, no rash Neuro:  Strength and sensation are intact Psych: euthymic mood, full affect   EKG:  EKG is not ordered today.    Recent Labs: 04/22/2014: BUN 18; Creatinine 0.93; Hemoglobin 13.3; Platelets 146*; Potassium 4.7; Sodium 142    Lipid Panel No results found for: CHOL, TRIG, HDL, CHOLHDL, VLDL, LDLCALC, LDLDIRECT    Wt Readings from Last 3 Encounters:  01/05/15 227 lb (102.967 kg)  08/11/14 228 lb (103.42 kg)  06/17/14 221 lb (100.245 kg)      Other studies Reviewed: Additional studies/ records that were reviewed today include: Epic review reveals that the patient has decided against transcatheter-aortic valve replacement.    ASSESSMENT AND PLAN:  Coronary artery disease involving coronary bypass graft with unspecified angina pectoris: No recent angina  Severe aortic stenosis: Critical with  progressive limitations in exertional tolerance. Medical therapy only at the patient's request.  Essential hypertension: Controlled  Chronic combined systolic and diastolic HF (heart failure), NYHA class 3: Marked exertional intolerance  Hyperlipidemia     Current medicines are reviewed at length with the patient today.  The patient has concerns regarding medicines.  The following changes have been made:  Would like to discontinue some of his medications such as his lipid therapy. He has had a relatively recent PCI and therefore lipid therapy should be continued. We did discuss his other cardioactive medications including hydrochlorothiazide, Imdur, beta blocker, and noted that therapy may change  as symptoms progress. They know to call if angina, syncope, or dyspnea.  Labs/ tests ordered today include:  No orders of the defined types were placed in this encounter.     Disposition:   FU with Linard Millers in 8 months   Signed, [name], MD  01/05/2015 2:16 PM    [facility] [address], [place], [place]  [address] Phone: [phone]; Fax: [phone]

## 2015-01-10 DIAGNOSIS — H3531 Nonexudative age-related macular degeneration: Secondary | ICD-10-CM | POA: Diagnosis not present

## 2015-01-27 ENCOUNTER — Other Ambulatory Visit: Payer: Self-pay

## 2015-01-27 MED ORDER — ISOSORBIDE MONONITRATE ER 60 MG PO TB24: 60.0000 mg | ORAL_TABLET | Freq: Every day | ORAL | Status: DC

## 2015-01-27 NOTE — Telephone Encounter (Signed)
Belva Crome, MD at 01/05/2015 2:16 PM  isosorbide mononitrate (IMDUR) 60 MG 24 hr tablet Take 60 mg by mouth daily          Patient Instructions     Your physician wants you to follow-up in: 6 months with Dr. Gaspar Bidding will receive a reminder letter in the mail two months in advance. If you don't receive a letter, please call our office to schedule the follow-up appointment.

## 2015-01-30 ENCOUNTER — Other Ambulatory Visit: Payer: Self-pay | Admitting: Cardiology

## 2015-01-31 ENCOUNTER — Other Ambulatory Visit: Payer: Self-pay | Admitting: *Deleted

## 2015-01-31 MED ORDER — METOPROLOL TARTRATE 25 MG PO TABS: 25.0000 mg | ORAL_TABLET | Freq: Two times a day (BID) | ORAL | Status: DC

## 2015-03-28 ENCOUNTER — Other Ambulatory Visit: Payer: Self-pay | Admitting: *Deleted

## 2015-03-28 MED ORDER — ATORVASTATIN CALCIUM 40 MG PO TABS
40.0000 mg | ORAL_TABLET | Freq: Every day | ORAL | Status: DC
Start: 2015-03-28 — End: 2016-03-02

## 2015-04-16 ENCOUNTER — Other Ambulatory Visit: Payer: Self-pay | Admitting: Interventional Cardiology

## 2015-05-28 ENCOUNTER — Other Ambulatory Visit: Payer: Self-pay | Admitting: Interventional Cardiology

## 2015-06-01 ENCOUNTER — Emergency Department (HOSPITAL_BASED_OUTPATIENT_CLINIC_OR_DEPARTMENT_OTHER)
Admission: EM | Admit: 2015-06-01 | Discharge: 2015-06-01 | Disposition: A | Discharge status: Home / Self Care | Payer: Medicare Other | Attending: Emergency Medicine | Admitting: Emergency Medicine

## 2015-06-01 ENCOUNTER — Encounter (HOSPITAL_BASED_OUTPATIENT_CLINIC_OR_DEPARTMENT_OTHER): Payer: Self-pay | Admitting: *Deleted

## 2015-06-01 DIAGNOSIS — I10 Essential (primary) hypertension: Secondary | ICD-10-CM | POA: Insufficient documentation

## 2015-06-01 DIAGNOSIS — R300 Dysuria: Secondary | ICD-10-CM | POA: Diagnosis present

## 2015-06-01 DIAGNOSIS — R339 Retention of urine, unspecified: Secondary | ICD-10-CM | POA: Insufficient documentation

## 2015-06-01 DIAGNOSIS — E559 Vitamin D deficiency, unspecified: Secondary | ICD-10-CM | POA: Insufficient documentation

## 2015-06-01 DIAGNOSIS — Z8572 Personal history of non-Hodgkin lymphomas: Secondary | ICD-10-CM | POA: Insufficient documentation

## 2015-06-01 DIAGNOSIS — Z951 Presence of aortocoronary bypass graft: Secondary | ICD-10-CM | POA: Diagnosis not present

## 2015-06-01 DIAGNOSIS — Z7902 Long term (current) use of antithrombotics/antiplatelets: Secondary | ICD-10-CM | POA: Insufficient documentation

## 2015-06-01 DIAGNOSIS — I252 Old myocardial infarction: Secondary | ICD-10-CM | POA: Diagnosis not present

## 2015-06-01 DIAGNOSIS — I2510 Atherosclerotic heart disease of native coronary artery without angina pectoris: Secondary | ICD-10-CM | POA: Diagnosis not present

## 2015-06-01 DIAGNOSIS — E785 Hyperlipidemia, unspecified: Secondary | ICD-10-CM | POA: Insufficient documentation

## 2015-06-01 DIAGNOSIS — Z87891 Personal history of nicotine dependence: Secondary | ICD-10-CM | POA: Diagnosis not present

## 2015-06-01 DIAGNOSIS — Z8669 Personal history of other diseases of the nervous system and sense organs: Secondary | ICD-10-CM | POA: Insufficient documentation

## 2015-06-01 DIAGNOSIS — Z7982 Long term (current) use of aspirin: Secondary | ICD-10-CM | POA: Insufficient documentation

## 2015-06-01 DIAGNOSIS — Z79899 Other long term (current) drug therapy: Secondary | ICD-10-CM | POA: Diagnosis not present

## 2015-06-01 DIAGNOSIS — I5042 Chronic combined systolic (congestive) and diastolic (congestive) heart failure: Secondary | ICD-10-CM | POA: Insufficient documentation

## 2015-06-01 DIAGNOSIS — Z9861 Coronary angioplasty status: Secondary | ICD-10-CM | POA: Diagnosis not present

## 2015-06-01 DIAGNOSIS — N400 Enlarged prostate without lower urinary tract symptoms: Secondary | ICD-10-CM | POA: Insufficient documentation

## 2015-06-01 DIAGNOSIS — M1990 Unspecified osteoarthritis, unspecified site: Secondary | ICD-10-CM | POA: Insufficient documentation

## 2015-06-01 DIAGNOSIS — E039 Hypothyroidism, unspecified: Secondary | ICD-10-CM | POA: Diagnosis not present

## 2015-06-01 LAB — CBC WITH DIFFERENTIAL/PLATELET
BASOS ABS: 0 10*3/uL (ref 0.0–0.1)
BASOS PCT: 1 % (ref 0–1)
EOS PCT: 1 % (ref 0–5)
Eosinophils Absolute: 0.1 10*3/uL (ref 0.0–0.7)
HCT: 46.2 % (ref 39.0–52.0)
Hemoglobin: 15 g/dL (ref 13.0–17.0)
Lymphocytes Relative: 16 % (ref 12–46)
Lymphs Abs: 1.4 10*3/uL (ref 0.7–4.0)
MCH: 27.8 pg (ref 26.0–34.0)
MCHC: 32.5 g/dL (ref 30.0–36.0)
MCV: 85.6 fL (ref 78.0–100.0)
MONO ABS: 0.6 10*3/uL (ref 0.1–1.0)
Monocytes Relative: 7 % (ref 3–12)
NEUTROS ABS: 6.5 10*3/uL (ref 1.7–7.7)
Neutrophils Relative %: 75 % (ref 43–77)
PLATELETS: 150 10*3/uL (ref 150–400)
RBC: 5.4 MIL/uL (ref 4.22–5.81)
RDW: 14.5 % (ref 11.5–15.5)
WBC: 8.6 10*3/uL (ref 4.0–10.5)

## 2015-06-01 LAB — BASIC METABOLIC PANEL
Anion gap: 8 (ref 5–15)
BUN: 16 mg/dL (ref 6–20)
CALCIUM: 9.6 mg/dL (ref 8.9–10.3)
CO2: 30 mmol/L (ref 22–32)
Chloride: 102 mmol/L (ref 101–111)
Creatinine, Ser: 0.91 mg/dL (ref 0.61–1.24)
GFR calc Af Amer: >60 mL/min (ref >60)
GFR calc non Af Amer: >60 mL/min (ref >60)
GLUCOSE: 126 mg/dL — AB (ref 65–99)
Potassium: 5.1 mmol/L (ref 3.5–5.1)
Sodium: 140 mmol/L (ref 135–145)

## 2015-06-01 LAB — URINALYSIS, ROUTINE W REFLEX MICROSCOPIC
BILIRUBIN URINE: NEGATIVE
Glucose, UA: NEGATIVE mg/dL
Hgb urine dipstick: NEGATIVE
KETONES UR: NEGATIVE mg/dL
LEUKOCYTES UA: NEGATIVE
NITRITE: NEGATIVE
Protein, ur: NEGATIVE mg/dL
Specific Gravity, Urine: 1.008 (ref 1.005–1.030)
UROBILINOGEN UA: 0.2 mg/dL (ref 0.0–1.0)
pH: 6.5 (ref 5.0–8.0)

## 2015-06-01 MED ORDER — ONDANSETRON 4 MG PO TBDP: ORAL_TABLET | ORAL | Status: DC

## 2015-06-01 MED ORDER — TAMSULOSIN HCL 0.4 MG PO CAPS: 0.4000 mg | ORAL_CAPSULE | Freq: Every day | ORAL | Status: DC

## 2015-06-01 NOTE — ED Provider Notes (Signed)
CSN: [id number]     Arrival date & time 06/01/15  1749 History   First MD Initiated Contact with Patient 06/01/15 [phone]     Chief Complaint  Patient presents with  . Dysuria     (Consider location/radiation/quality/duration/timing/severity/associated sxs/prior Treatment) Patient is a 81 y.o. male presenting with dysuria.  Dysuria This is a new problem. Episode onset: chronically, worsening last night. The problem occurs constantly. The problem has been rapidly worsening. Pertinent negatives include no chest pain, no abdominal pain, no headaches and no shortness of breath. Nothing aggravates the symptoms. Nothing relieves the symptoms.    Past Medical History  Diagnosis Date  . Coronary artery disease     with occluded SVG to diagonal and BMS to prox SVG of RCA 10/2010. Recth 12/2012, 70% ISR SVG to RCA, 50% SVG to Cfx, 100% SVG to diagonal, and Patent LIMA with diffuse LAD dz.  . Hypertension   . Non-ST elevation MI (NSTEMI)   . Hyperlipidemia   . Insomnia   . Vitamin D deficiency   . Aortic stenosis     severe by echo September 2014  . Hypothyroidism   . RBBB   . Diverticulosis   . Non Hodgkin's lymphoma   . Enlarged prostate     per patient  with mild effect on urine stream  . Arthritis     osteo knees "   . Chronic combined systolic and diastolic HF (heart failure), NYHA class 3 09/09/2013    LVEF 45%.    Past Surgical History  Procedure Laterality Date  . Ankle surgery    . Coronary artery bypass graft  1993  . Hemorrhoid surgery    . Right inguinal hernia repair    . Coronary stent placement  04/21/2014    bare metal SVG   DR [facility]  . Knee arthroscopy Right 1970'S   . Left heart catheterization with coronary/graft angiogram N/A 01/02/2013    Procedure: LEFT HEART CATHETERIZATION WITH [name];  Surgeon: [name], MD;  Location: [facility] CATH LAB;  Service: Cardiovascular;  Laterality: N/A;  . Left and right heart catheterization with coronary/graft  angiogram N/A 04/05/2014    Procedure: LEFT AND RIGHT HEART CATHETERIZATION WITH [name];  Surgeon: Burnell Blanks, MD;  Location: [facility] CATH LAB;  Service: Cardiovascular;  Laterality: N/A;  . Percutaneous coronary stent intervention (pci-s) N/A 04/21/2014    Procedure: PERCUTANEOUS CORONARY STENT INTERVENTION (PCI-S);  Surgeon: [name], MD;  Location: [facility] CATH LAB;  Service: Cardiovascular;  Laterality: N/A;   Family History  Problem Relation Age of Onset  . Colon cancer Father   . Stroke Mother   . CAD Brother    Social History  Substance Use Topics  . Smoking status: Former Smoker -- 0.50 packs/day for 15 years    Types: Cigarettes  . Smokeless tobacco: Never Used     Comment: quit smoking  in 1962   . Alcohol Use: No    Review of Systems  Respiratory: Negative for shortness of breath.   Cardiovascular: Negative for chest pain.  Gastrointestinal: Negative for abdominal pain.  Genitourinary: Positive for dysuria.  Neurological: Negative for headaches.  All other systems reviewed and are negative.     Allergies  Review of patient's allergies indicates no known allergies.  Home Medications   Prior to Admission medications   Medication Sig Start Date End Date Taking? Authorizing Provider  aspirin EC 81 MG tablet Take 81 mg by mouth daily.  Historical Provider, MD  atorvastatin (LIPITOR) 40 MG tablet Take 1 tablet (40 mg total) by mouth daily at 6 PM. 03/28/15   Belva Crome, MD  clopidogrel (PLAVIX) 75 MG tablet Take 75 mg by mouth daily.    Historical Provider, MD  Cyanocobalamin (VITAMIN B 12 PO) Take 1 tablet by mouth daily.    Historical Provider, MD  GARLIC PO Take 1 tablet by mouth every other day.     Historical Provider, MD  hydrochlorothiazide (MICROZIDE) 12.5 MG capsule TAKE 1 CAPSULE (12.5 MG TOTAL) BY MOUTH DAILY. 04/18/15   Belva Crome, MD  isosorbide mononitrate (IMDUR) 60 MG 24 hr tablet Take 1 tablet (60 mg total) by mouth  daily. 01/27/15   Belva Crome, MD  levothyroxine (SYNTHROID, LEVOTHROID) 125 MCG tablet Take 125 mcg by mouth daily before breakfast.  11/23/14   Historical Provider, MD  metoprolol tartrate (LOPRESSOR) 25 MG tablet TAKE 1 TABLET (25 MG TOTAL) BY MOUTH 2 (TWO) TIMES DAILY. 01/31/15   Belva Crome, MD  nitroGLYCERIN (NITROSTAT) 0.4 MG SL tablet Place 1 tablet (0.4 mg total) under the tongue every 5 (five) minutes as needed for chest pain. 01/05/15   Belva Crome, MD  ondansetron (ZOFRAN ODT) 4 MG disintegrating tablet 4mg  ODT q4 hours prn nausea/vomit 06/01/15   Debby Freiberg, MD  OVER THE COUNTER MEDICATION Take 1 tablet by mouth daily. Prostate essentials supplement     Historical Provider, MD  SELENIUM PO Take 1 tablet by mouth daily.      Historical Provider, MD  tamsulosin (FLOMAX) 0.4 MG CAPS capsule Take 1 capsule (0.4 mg total) by mouth daily. 06/01/15   Debby Freiberg, MD  VITAMIN D, ERGOCALCIFEROL, PO Take 1 tablet by mouth daily. Patient takes 3-4 times weekly    Historical Provider, MD   BP 152/87 mmHg  Pulse 86  Temp(Src) 97.6 F (36.4 C) (Oral)  Resp 16  SpO2 98% Physical Exam  Constitutional: He is oriented to person, place, and time. He appears well-developed and well-nourished.  HENT:  Head: Normocephalic and atraumatic.  Eyes: Conjunctivae and EOM are normal.  Neck: Normal range of motion. Neck supple.  Cardiovascular: Normal rate, regular rhythm and normal heart sounds.   Pulmonary/Chest: Effort normal and breath sounds normal. No respiratory distress.  Abdominal: He exhibits no distension. There is no tenderness. There is no rebound and no guarding.  Musculoskeletal: Normal range of motion.  Neurological: He is alert and oriented to person, place, and time.  Skin: Skin is warm and dry.  Vitals reviewed.   ED Course  Procedures (including critical care time) Labs Review Labs Reviewed  BASIC METABOLIC PANEL - Abnormal; Notable for the following:    Glucose, Bld 126  (*)    All other components within normal limits  CBC WITH DIFFERENTIAL/PLATELET  URINALYSIS, ROUTINE W REFLEX MICROSCOPIC (NOT AT [facility])    Imaging Review No results found. I have personally reviewed and evaluated these images and lab results as part of my medical decision-making.   EKG Interpretation None      MDM   Final diagnoses:  Urinary retention    81 y.o. male with pertinent PMH of CAD, HTN, chronic difficulty urinating and BPH presents with acute on chronic urinary retention. Patient had nausea, no vomiting, no fevers. On arrival today vitals signs and physical exam as above. Bladder scan at bedside demonstrated 400cc.  Leg bag foley placed.  Elba Barman otherwise unremarkable.  Likely retention due to BPH.  FU  with urology.  I have reviewed all laboratory and imaging studies if ordered as above  1. Urinary retention         Debby Freiberg, MD 06/02/15 [phone]

## 2015-06-01 NOTE — ED Notes (Signed)
16 Fr urinary catheter inserted very easily, pt tolerated well, immediate return of urine noted in foley tubing and drainage bag. Pt states immediate relief with comfort after urinary catheter insertion completed

## 2015-06-01 NOTE — ED Notes (Signed)
Suprapubic area feels soft at this time, pt states has increase of discomfort with palpation of area

## 2015-06-01 NOTE — Discharge Instructions (Signed)
Acute Urinary Retention °Acute urinary retention is the temporary inability to urinate. °This is a common problem in older men. As men age their prostates become larger and block the flow of urine from the bladder. This is usually a problem that has come on gradually.  °HOME CARE INSTRUCTIONS °If you are sent home with a Foley catheter and a drainage system, you will need to discuss the best course of action with your health care provider. While the catheter is in, maintain a good intake of fluids. Keep the drainage bag emptied and lower than your catheter. This is so that contaminated urine will not flow back into your bladder, which could lead to a urinary tract infection. °There are two main types of drainage bags. One is a large bag that usually is used at night. It has a good capacity that will allow you to sleep through the night without having to empty it. The second type is called a leg bag. It has a smaller capacity, so it needs to be emptied more frequently. However, the main advantage is that it can be attached by a leg strap and can go underneath your clothing, allowing you the freedom to move about or leave your home. °Only take over-the-counter or prescription medicines for pain, discomfort, or fever as directed by your health care provider.  °SEEK MEDICAL CARE IF: °· You develop a low-grade fever. °· You experience spasms or leakage of urine with the spasms. °SEEK IMMEDIATE MEDICAL CARE IF:  °· You develop chills or fever. °· Your catheter stops draining urine. °· Your catheter falls out. °· You start to develop increased bleeding that does not respond to rest and increased fluid intake. °MAKE SURE YOU: °· Understand these instructions. °· Will watch your condition. °· Will get help right away if you are not doing well or get worse. °Document Released: 12/31/2000 Document Revised: 09/29/2013 Document Reviewed: 03/05/2013 °Exit[employer]® Patient Information ©2015 [employer]. This information is not  intended to replace advice given to you by your health care provider. Make sure you discuss any questions you have with your health care provider. ° °

## 2015-06-01 NOTE — ED Notes (Signed)
Since 0100hrs, unable to void, states has had a "small dribble of urine while showering"

## 2015-06-01 NOTE — ED Notes (Signed)
MD at bedside. 

## 2015-06-01 NOTE — ED Notes (Signed)
Pt states has hx of unable to void, attempted to void since 0100hrs today.

## 2015-06-01 NOTE — ED Notes (Signed)
Leg bag applied, pt instructions provided on emptying and applying/ removing straps, pt able to return demonstration, importance of hand washing also discussed

## 2015-06-01 NOTE — ED Notes (Signed)
Bladder scan performed: 460mls indicated

## 2015-06-09 ENCOUNTER — Encounter (HOSPITAL_BASED_OUTPATIENT_CLINIC_OR_DEPARTMENT_OTHER): Payer: Self-pay

## 2015-06-09 ENCOUNTER — Emergency Department (HOSPITAL_BASED_OUTPATIENT_CLINIC_OR_DEPARTMENT_OTHER)
Admission: EM | Admit: 2015-06-09 | Discharge: 2015-06-09 | Disposition: A | Discharge status: Home / Self Care | Payer: Medicare Other | Attending: Emergency Medicine | Admitting: Emergency Medicine

## 2015-06-09 ENCOUNTER — Emergency Department (HOSPITAL_BASED_OUTPATIENT_CLINIC_OR_DEPARTMENT_OTHER): Payer: Medicare Other

## 2015-06-09 DIAGNOSIS — E039 Hypothyroidism, unspecified: Secondary | ICD-10-CM | POA: Insufficient documentation

## 2015-06-09 DIAGNOSIS — Z951 Presence of aortocoronary bypass graft: Secondary | ICD-10-CM | POA: Diagnosis not present

## 2015-06-09 DIAGNOSIS — M1990 Unspecified osteoarthritis, unspecified site: Secondary | ICD-10-CM | POA: Insufficient documentation

## 2015-06-09 DIAGNOSIS — Z8572 Personal history of non-Hodgkin lymphomas: Secondary | ICD-10-CM | POA: Insufficient documentation

## 2015-06-09 DIAGNOSIS — W1839XA Other fall on same level, initial encounter: Secondary | ICD-10-CM | POA: Diagnosis not present

## 2015-06-09 DIAGNOSIS — I2510 Atherosclerotic heart disease of native coronary artery without angina pectoris: Secondary | ICD-10-CM | POA: Diagnosis not present

## 2015-06-09 DIAGNOSIS — I252 Old myocardial infarction: Secondary | ICD-10-CM | POA: Insufficient documentation

## 2015-06-09 DIAGNOSIS — E785 Hyperlipidemia, unspecified: Secondary | ICD-10-CM | POA: Insufficient documentation

## 2015-06-09 DIAGNOSIS — Y9289 Other specified places as the place of occurrence of the external cause: Secondary | ICD-10-CM | POA: Insufficient documentation

## 2015-06-09 DIAGNOSIS — Y998 Other external cause status: Secondary | ICD-10-CM | POA: Diagnosis not present

## 2015-06-09 DIAGNOSIS — E559 Vitamin D deficiency, unspecified: Secondary | ICD-10-CM | POA: Diagnosis not present

## 2015-06-09 DIAGNOSIS — M25531 Pain in right wrist: Secondary | ICD-10-CM | POA: Diagnosis not present

## 2015-06-09 DIAGNOSIS — Z8669 Personal history of other diseases of the nervous system and sense organs: Secondary | ICD-10-CM | POA: Insufficient documentation

## 2015-06-09 DIAGNOSIS — Y9389 Activity, other specified: Secondary | ICD-10-CM | POA: Insufficient documentation

## 2015-06-09 DIAGNOSIS — Z87891 Personal history of nicotine dependence: Secondary | ICD-10-CM | POA: Diagnosis not present

## 2015-06-09 DIAGNOSIS — S6991XA Unspecified injury of right wrist, hand and finger(s), initial encounter: Secondary | ICD-10-CM | POA: Diagnosis present

## 2015-06-09 DIAGNOSIS — I5042 Chronic combined systolic (congestive) and diastolic (congestive) heart failure: Secondary | ICD-10-CM | POA: Insufficient documentation

## 2015-06-09 DIAGNOSIS — S63501A Unspecified sprain of right wrist, initial encounter: Secondary | ICD-10-CM

## 2015-06-09 NOTE — ED Provider Notes (Signed)
CSN: [id number]     Arrival date & time 06/09/15  1345 History   First MD Initiated Contact with Patient 06/09/15 1440     Chief Complaint  Patient presents with  . Wrist Pain     (Consider location/radiation/quality/duration/timing/severity/associated sxs/prior Treatment) Patient is a 81 y.o. male presenting with wrist pain. The history is provided by the patient. No language interpreter was used.  Wrist Pain This is a new problem. The current episode started today (2 days ago). The problem occurs constantly. The problem has been gradually worsening. Associated symptoms include joint swelling and myalgias. Nothing aggravates the symptoms. He has tried nothing for the symptoms. The treatment provided moderate relief.    Past Medical History  Diagnosis Date  . Coronary artery disease     with occluded SVG to diagonal and BMS to prox SVG of RCA 10/2010. Recth 12/2012, 70% ISR SVG to RCA, 50% SVG to Cfx, 100% SVG to diagonal, and Patent LIMA with diffuse LAD dz.  . Hypertension   . Non-ST elevation MI (NSTEMI)   . Hyperlipidemia   . Insomnia   . Vitamin D deficiency   . Aortic stenosis     severe by echo September 2014  . Hypothyroidism   . RBBB   . Diverticulosis   . Non Hodgkin's lymphoma   . Enlarged prostate     per patient  with mild effect on urine stream  . Arthritis     osteo knees "   . Chronic combined systolic and diastolic HF (heart failure), NYHA class 3 09/09/2013    LVEF 45%.    Past Surgical History  Procedure Laterality Date  . Ankle surgery    . Coronary artery bypass graft  1993  . Hemorrhoid surgery    . Right inguinal hernia repair    . Coronary stent placement  04/21/2014    bare metal SVG   DR [facility]  . Knee arthroscopy Right 1970'S   . Left heart catheterization with coronary/graft angiogram N/A 01/02/2013    Procedure: LEFT HEART CATHETERIZATION WITH [name];  Surgeon: [name], MD;  Location: [facility] CATH LAB;  Service:  Cardiovascular;  Laterality: N/A;  . Left and right heart catheterization with coronary/graft angiogram N/A 04/05/2014    Procedure: LEFT AND RIGHT HEART CATHETERIZATION WITH [name];  Surgeon: Burnell Blanks, MD;  Location: [facility] CATH LAB;  Service: Cardiovascular;  Laterality: N/A;  . Percutaneous coronary stent intervention (pci-s) N/A 04/21/2014    Procedure: PERCUTANEOUS CORONARY STENT INTERVENTION (PCI-S);  Surgeon: [name], MD;  Location: [facility] CATH LAB;  Service: Cardiovascular;  Laterality: N/A;   Family History  Problem Relation Age of Onset  . Colon cancer Father   . Stroke Mother   . CAD Brother    Social History  Substance Use Topics  . Smoking status: Former Smoker -- 0.50 packs/day for 15 years    Types: Cigarettes  . Smokeless tobacco: Never Used     Comment: quit smoking  in 1962   . Alcohol Use: No    Review of Systems  Musculoskeletal: Positive for myalgias and joint swelling.  Skin: Negative for color change.  All other systems reviewed and are negative.     Allergies  Review of patient's allergies indicates no known allergies.  Home Medications   Prior to Admission medications   Medication Sig Start Date End Date Taking? Authorizing Provider  aspirin EC 81 MG tablet Take 81 mg by mouth daily.  Historical Provider, MD  atorvastatin (LIPITOR) 40 MG tablet Take 1 tablet (40 mg total) by mouth daily at 6 PM. 03/28/15   Belva Crome, MD  clopidogrel (PLAVIX) 75 MG tablet Take 75 mg by mouth daily.    Historical Provider, MD  Cyanocobalamin (VITAMIN B 12 PO) Take 1 tablet by mouth daily.    Historical Provider, MD  GARLIC PO Take 1 tablet by mouth every other day.     Historical Provider, MD  hydrochlorothiazide (MICROZIDE) 12.5 MG capsule TAKE 1 CAPSULE (12.5 MG TOTAL) BY MOUTH DAILY. 04/18/15   Belva Crome, MD  isosorbide mononitrate (IMDUR) 60 MG 24 hr tablet Take 1 tablet (60 mg total) by mouth daily. 01/27/15   Belva Crome,  MD  levothyroxine (SYNTHROID, LEVOTHROID) 125 MCG tablet Take 125 mcg by mouth daily before breakfast.  11/23/14   Historical Provider, MD  metoprolol tartrate (LOPRESSOR) 25 MG tablet TAKE 1 TABLET (25 MG TOTAL) BY MOUTH 2 (TWO) TIMES DAILY. 01/31/15   Belva Crome, MD  nitroGLYCERIN (NITROSTAT) 0.4 MG SL tablet Place 1 tablet (0.4 mg total) under the tongue every 5 (five) minutes as needed for chest pain. 01/05/15   Belva Crome, MD  ondansetron (ZOFRAN ODT) 4 MG disintegrating tablet 4mg  ODT q4 hours prn nausea/vomit 06/01/15   Debby Freiberg, MD  OVER THE COUNTER MEDICATION Take 1 tablet by mouth daily. Prostate essentials supplement     Historical Provider, MD  SELENIUM PO Take 1 tablet by mouth daily.      Historical Provider, MD  tamsulosin (FLOMAX) 0.4 MG CAPS capsule Take 1 capsule (0.4 mg total) by mouth daily. 06/01/15   Debby Freiberg, MD  VITAMIN D, ERGOCALCIFEROL, PO Take 1 tablet by mouth daily. Patient takes 3-4 times weekly    Historical Provider, MD   BP 110/41 mmHg  Pulse 65  Temp(Src) 98.5 F (36.9 C) (Oral)  Resp 18  Ht 5\' 10"  (1.778 m)  Wt 225 lb (102.059 kg)  BMI 32.28 kg/m2  SpO2 97% Physical Exam  Constitutional: He is oriented to person, place, and time. He appears well-developed and well-nourished.  Musculoskeletal: He exhibits edema and tenderness.  Swollen tender right hand and right wrist  From  nv and ns intact  Neurological: He is alert and oriented to person, place, and time. He has normal reflexes.  Skin: Skin is warm.  Psychiatric: He has a normal mood and affect.    ED Course  Procedures (including critical care time) Labs Review Labs Reviewed - No data to display  Imaging Review Dg Wrist Complete Right  06/09/2015   CLINICAL DATA:  Fall 2 days ago with right wrist pain.  EXAM: RIGHT WRIST - COMPLETE 3+ VIEW  COMPARISON:  09/06/2011  FINDINGS: No evidence of acute fracture or dislocation.  Mild chondrocalcinosis the wrist joint.  No notable joint  narrowing.  IMPRESSION: Negative.   Electronically Signed   By: Monte Fantasia M.D.   On: 06/09/2015 14:31   I have personally reviewed and evaluated these images and lab results as part of my medical decision-making.   EKG Interpretation None      MDM   Final diagnoses:  Sprain of wrist, right, initial encounter    Wrist splint Follow up with Dr. Mayer Camel for evaluation and recheck in 1 week   Fransico Meadow, PA-C 06/09/15 [place], MD 06/13/15 [phone]

## 2015-06-09 NOTE — ED Notes (Signed)
Right wrist injury from a fall on Tuesday

## 2015-06-09 NOTE — Discharge Instructions (Signed)

## 2015-06-10 DIAGNOSIS — N401 Enlarged prostate with lower urinary tract symptoms: Secondary | ICD-10-CM | POA: Diagnosis not present

## 2015-06-10 DIAGNOSIS — R339 Retention of urine, unspecified: Secondary | ICD-10-CM | POA: Diagnosis not present

## 2015-06-16 DIAGNOSIS — M25531 Pain in right wrist: Secondary | ICD-10-CM | POA: Diagnosis not present

## 2015-06-16 DIAGNOSIS — M79672 Pain in left foot: Secondary | ICD-10-CM | POA: Diagnosis not present

## 2015-06-27 ENCOUNTER — Telehealth: Payer: Self-pay | Admitting: Interventional Cardiology

## 2015-06-27 DIAGNOSIS — I350 Nonrheumatic aortic (valve) stenosis: Secondary | ICD-10-CM

## 2015-06-27 NOTE — Telephone Encounter (Signed)
Dr.Smith aware. Message fwd to Dr.Smith to review and advise

## 2015-06-27 NOTE — Telephone Encounter (Signed)
New Message      Pt's daughter calling stating that pt thinks he wants to do the valve procedure previously spoken about with Dr. Tamala Julian. Please call back and advise.

## 2015-06-27 NOTE — Telephone Encounter (Signed)
Please repeat echocardiogram to determine where we are. Indication for the echo is aortic stenosis. Will refer back to valve clinic depending upon findings.

## 2015-06-28 NOTE — Telephone Encounter (Signed)
Pt daughter aware of Dr.Smith's response. Please repeat echocardiogram to determine where we are. Indication for the echo is aortic stenosis. Will refer back to valve clinic depending upon findings. Adv pt daughter a scheduler from our office will call pt to schedule his echo, she verbalized understanding.

## 2015-07-06 DIAGNOSIS — Z1389 Encounter for screening for other disorder: Secondary | ICD-10-CM | POA: Diagnosis not present

## 2015-07-06 DIAGNOSIS — D81818 Other biotin-dependent carboxylase deficiency: Secondary | ICD-10-CM | POA: Diagnosis not present

## 2015-07-06 DIAGNOSIS — E039 Hypothyroidism, unspecified: Secondary | ICD-10-CM | POA: Diagnosis not present

## 2015-07-06 DIAGNOSIS — I951 Orthostatic hypotension: Secondary | ICD-10-CM | POA: Diagnosis not present

## 2015-07-06 DIAGNOSIS — I25119 Atherosclerotic heart disease of native coronary artery with unspecified angina pectoris: Secondary | ICD-10-CM | POA: Diagnosis not present

## 2015-07-06 DIAGNOSIS — R600 Localized edema: Secondary | ICD-10-CM | POA: Diagnosis not present

## 2015-07-06 DIAGNOSIS — R972 Elevated prostate specific antigen [PSA]: Secondary | ICD-10-CM | POA: Diagnosis not present

## 2015-07-06 DIAGNOSIS — I10 Essential (primary) hypertension: Secondary | ICD-10-CM | POA: Diagnosis not present

## 2015-07-06 DIAGNOSIS — Z0001 Encounter for general adult medical examination with abnormal findings: Secondary | ICD-10-CM | POA: Diagnosis not present

## 2015-07-06 DIAGNOSIS — E782 Mixed hyperlipidemia: Secondary | ICD-10-CM | POA: Diagnosis not present

## 2015-07-06 DIAGNOSIS — Z79899 Other long term (current) drug therapy: Secondary | ICD-10-CM | POA: Diagnosis not present

## 2015-07-06 DIAGNOSIS — E559 Vitamin D deficiency, unspecified: Secondary | ICD-10-CM | POA: Diagnosis not present

## 2015-07-07 ENCOUNTER — Other Ambulatory Visit: Payer: Self-pay

## 2015-07-07 ENCOUNTER — Ambulatory Visit (HOSPITAL_COMMUNITY): Payer: Medicare Other | Attending: Interventional Cardiology

## 2015-07-07 DIAGNOSIS — I340 Nonrheumatic mitral (valve) insufficiency: Secondary | ICD-10-CM | POA: Insufficient documentation

## 2015-07-07 DIAGNOSIS — I352 Nonrheumatic aortic (valve) stenosis with insufficiency: Secondary | ICD-10-CM | POA: Insufficient documentation

## 2015-07-07 DIAGNOSIS — Z87891 Personal history of nicotine dependence: Secondary | ICD-10-CM | POA: Diagnosis not present

## 2015-07-07 DIAGNOSIS — Z951 Presence of aortocoronary bypass graft: Secondary | ICD-10-CM | POA: Insufficient documentation

## 2015-07-07 DIAGNOSIS — I350 Nonrheumatic aortic (valve) stenosis: Secondary | ICD-10-CM

## 2015-07-07 DIAGNOSIS — I517 Cardiomegaly: Secondary | ICD-10-CM | POA: Insufficient documentation

## 2015-07-07 DIAGNOSIS — E785 Hyperlipidemia, unspecified: Secondary | ICD-10-CM | POA: Insufficient documentation

## 2015-07-07 DIAGNOSIS — Z8249 Family history of ischemic heart disease and other diseases of the circulatory system: Secondary | ICD-10-CM | POA: Insufficient documentation

## 2015-07-07 DIAGNOSIS — I10 Essential (primary) hypertension: Secondary | ICD-10-CM | POA: Diagnosis not present

## 2015-07-07 DIAGNOSIS — I071 Rheumatic tricuspid insufficiency: Secondary | ICD-10-CM | POA: Insufficient documentation

## 2015-07-11 ENCOUNTER — Telehealth: Payer: Self-pay

## 2015-07-11 NOTE — Telephone Encounter (Signed)
-----   Message from Belva Crome, MD sent at 07/08/2015  6:16 PM EDT ----- This study is not significantly changed compared to the prior study in 2015. He needs to come in to discuss or hiking call him on the phone before we make referral to valve clinic.

## 2015-07-11 NOTE — Telephone Encounter (Signed)
Pt wife aware of echo results. This study is not significantly changed compared to the prior study in 2015. He needs to come in to discuss or I can call him on the phone before we make referral to valve clinic. Pt appt moved up to 10/4 @ 4pm. Pt wife voiced appreciation and verbalized understanding.

## 2015-07-12 ENCOUNTER — Ambulatory Visit (INDEPENDENT_AMBULATORY_CARE_PROVIDER_SITE_OTHER): Payer: Medicare Other | Admitting: Interventional Cardiology

## 2015-07-12 ENCOUNTER — Encounter: Payer: Self-pay | Admitting: Interventional Cardiology

## 2015-07-12 VITALS — BP 120/70 | HR 57 | Ht 70.0 in | Wt 222.4 lb

## 2015-07-12 DIAGNOSIS — I10 Essential (primary) hypertension: Secondary | ICD-10-CM | POA: Diagnosis not present

## 2015-07-12 DIAGNOSIS — I5032 Chronic diastolic (congestive) heart failure: Secondary | ICD-10-CM | POA: Diagnosis not present

## 2015-07-12 DIAGNOSIS — I350 Nonrheumatic aortic (valve) stenosis: Secondary | ICD-10-CM | POA: Diagnosis not present

## 2015-07-12 DIAGNOSIS — I25709 Atherosclerosis of coronary artery bypass graft(s), unspecified, with unspecified angina pectoris: Secondary | ICD-10-CM

## 2015-07-12 MED ORDER — ISOSORBIDE MONONITRATE ER 60 MG PO TB24: 90.0000 mg | ORAL_TABLET | Freq: Every day | ORAL | Status: DC

## 2015-07-12 NOTE — Patient Instructions (Signed)
**Note De-Identified Alif Petrak Obfuscation** Medication Instructions:  Increase Isosorbide to 90 mg (1 and 1/2 tablets) daily. All other medications remain the same.  Labwork: None  Testing/Procedures: None  Follow-Up: To be determined  Any Other Special Instructions Will Be Listed Below (If Applicable). Please call us in 1 week at [phone] to let us know if your chest pain is better and if you have decided to have the heart Cath

## 2015-07-12 NOTE — Progress Notes (Signed)
Cardiology Office Note   Date:  07/12/2015   ID:  Tim Campbell, DOB 03-10-1926, MRN [id number]  PCP:  [name], MD  Cardiologist:  [name], MD   Chief Complaint  Patient presents with  . Cardiac Valve Problem      History of Present Illness: Tim Campbell is a 81 y.o. 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 Patient has known combined systolic and diastolic heart failure.   He is noticing exertional chest tightness. This is similar to that present greater than a year ago when the  SVG to the circumflex  was stented.  Since that time he has done well until the past 6-8 weeks. He volunteers today that he had an episode of discomfort at rest and nitroglycerin was needed for relief. There is also some increasing shortness of breath. Because of these complaints the patient called earlier this month requesting to be referred back to the valve clinic. An echo was done and demonstrated no significant progression of left ear.    Past Medical History  Diagnosis Date  . Coronary artery disease     with occluded SVG to diagonal and BMS to prox SVG of RCA 10/2010. Recth 12/2012, 70% ISR SVG to RCA, 50% SVG to Cfx, 100% SVG to diagonal, and Patent LIMA with diffuse LAD dz.  . Hypertension   . Non-ST elevation MI (NSTEMI) ([place])   . Hyperlipidemia   . Insomnia   . Vitamin D deficiency   . Aortic stenosis     severe by echo September 2014  . Hypothyroidism   . RBBB   . Diverticulosis   . Non Hodgkin's lymphoma ([place])   . Enlarged prostate     per patient  with mild effect on urine stream  . Arthritis     osteo knees "   . Chronic combined systolic and diastolic HF (heart failure), NYHA class 3 09/09/2013    LVEF 45%.     Past Surgical History  Procedure  Laterality Date  . Ankle surgery    . Coronary artery bypass graft  1993  . Hemorrhoid surgery    . Right inguinal hernia repair    . Coronary stent placement  04/21/2014    bare metal SVG   [facility]  . Knee arthroscopy Right 1970'S   . Left heart catheterization with coronary/graft angiogram N/A 01/02/2013    Procedure: LEFT HEART CATHETERIZATION WITH [name];  Surgeon: [name], MD;  Location: [facility] CATH LAB;  Service: Cardiovascular;  Laterality: N/A;  . Left and right heart catheterization with coronary/graft angiogram N/A 04/05/2014    Procedure: LEFT AND RIGHT HEART CATHETERIZATION WITH [name];  Surgeon: Burnell Blanks, MD;  Location: [facility] CATH LAB;  Service: Cardiovascular;  Laterality: N/A;  . Percutaneous coronary stent intervention (pci-s) N/A 04/21/2014    Procedure: PERCUTANEOUS CORONARY STENT INTERVENTION (PCI-S);  Surgeon: [name], MD;  Location: [facility] CATH LAB;  Service: Cardiovascular;  Laterality: N/A;     Current Outpatient Prescriptions  Medication Sig Dispense Refill  . Ascorbic Acid (VITAMIN C) 1000 MG tablet Take 1,000 mg by mouth every other day.    Marland Kitchen aspirin EC 81 MG tablet Take 81 mg by mouth daily.    Marland Kitchen atorvastatin (  LIPITOR) 40 MG tablet Take 1 tablet (40 mg total) by mouth daily at 6 PM. 30 tablet 11  . B Complex-C (B-COMPLEX WITH VITAMIN C) tablet Take 1 tablet by mouth 3 (three) times a week.    . CHLOROPHYLL PO Take 2 tablets by mouth daily.    . Coenzyme Q10 (CO Q 10 PO) Take 1 capsule by mouth daily.    . finasteride (PROSCAR) 5 MG tablet Take 5 mg by mouth daily.  11  . folic acid (FOLVITE) 130 MCG tablet Take 800 mcg by mouth every other day.    . Garlic 865 MG TABS Take 500 mg by mouth every other day.    . hydrochlorothiazide (MICROZIDE) 12.5 MG capsule TAKE 1 CAPSULE (12.5 MG TOTAL) BY MOUTH DAILY. 30 capsule 6  . levothyroxine (SYNTHROID, LEVOTHROID) 125 MCG tablet Take 125 mcg by mouth daily before  breakfast.   3  . Magnesium 200 MG TABS Take 200 mg by mouth daily.    . metoprolol tartrate (LOPRESSOR) 25 MG tablet Take 25 mg by mouth daily.    . Multiple Vitamins-Iron (CHLORELLA PO) Take 4 tablets by mouth 2 (two) times daily. MED NAME: CHLORELLA    . NAT-RUL PSYLLIUM SEED HUSKS PO Take 610 mg by mouth 2 (two) times daily.    . nitroGLYCERIN (NITROSTAT) 0.4 MG SL tablet Place 1 tablet (0.4 mg total) under the tongue every 5 (five) minutes as needed for chest pain. 25 tablet 11  . OVER THE COUNTER MEDICATION Take 1 tablet by mouth daily. MED NAME: QUANTUM HEART MAX    . OVER THE COUNTER MEDICATION Take 1,200 mg by mouth every other day. MED NAME: SUNFLOWER LECITHIN    . Potassium 99 MG TABS Take 1 tablet by mouth every other day.    . SELENIUM PO Take 1 tablet by mouth every other day.    . tamsulosin (FLOMAX) 0.4 MG CAPS capsule Take 1 capsule (0.4 mg total) by mouth daily. 30 capsule 0  . VITAMIN D, ERGOCALCIFEROL, PO Take 1 tablet by mouth daily. Patient takes 3-4 times weekly     No current facility-administered medications for this visit.    Allergies:   Review of patient's allergies indicates no known allergies.    Social History:  The patient  reports that he has quit smoking. His smoking use included Cigarettes. He has a 7.5 pack-year smoking history. He has never used smokeless tobacco. He reports that he does not drink alcohol or use illicit drugs.   Family History:  The patient's family history includes Bone cancer in his brother; CAD in his brother; Colon cancer in his father; Dementia in his sister; Parkinson's disease in his brother; Stroke in his mother, sister, and sister.    ROS:  Please see the history of present illness.   Otherwise, review of systems are positive for  Denies melanoma, syncope, orthopnea, and PND.   All other systems are reviewed and negative.    PHYSICAL EXAM: VS:  BP 98/54 mmHg  Pulse 57  Ht 5\' 10"  (1.778 m)  Wt 100.88 kg (222 lb 6.4 oz)  BMI  31.91 kg/m2 , BMI Body mass index is 31.91 kg/(m^2). GEN: Well nourished, well developed, in no acute distress HEENT: normal Neck: no JVD, carotid bruits, or masses Cardiac: RRR.  There  3 to 4/6 crescendo decrescendo high-pitched systolic murmur of aortic stenosis. No rub, or gallop. There is  no edema. Respiratory:  clear to auscultation bilaterally, normal work of breathing. GI:  soft, nontender, nondistended, + BS MS: no deformity or atrophy Skin: warm and dry, no rash Neuro:  Strength and sensation are intact Psych: euthymic mood, full affect   EKG:  EKG  is ordered today. The ekg reveals  Sinus bradycardia at 57 bpm , incomplete right bundle, all septal infarct.   Recent Labs: 06/01/2015: BUN 16; Creatinine, Ser 0.91; Hemoglobin 15.0; Platelets 150; Potassium 5.1; Sodium 140    Lipid Panel No results found for: CHOL, TRIG, HDL, CHOLHDL, VLDL, LDLCALC, LDLDIRECT    Wt Readings from Last 3 Encounters:  07/12/15 100.88 kg (222 lb 6.4 oz)  06/09/15 102.059 kg (225 lb)  01/05/15 102.967 kg (227 lb)      Other studies Reviewed: Additional studies/ records that were reviewed today include:  The most recent echo was reviewed. Most recent coronary angiogram was reviewed.. The findings include  Based upon the patient's current symptoms it is more likely that he has graft restenosis than symptoms related to progression of coronary disease..    ASSESSMENT AND PLAN:  1. Aortic stenosis  Severe based upon  Recent echo but not significantly different than one year ago. LV function remains normal.  2. Coronary artery disease involving coronary bypass graft with unspecified angina pectoris  Recurrent angina with some discomfort occurring at rest. 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 The most recent stent procedure was on the saphenous vein graft to the circumflex. He had a previously placed stent in the ostium of the  graft to the right coronary. At the last cath this stent had a 50-70% narrowing.  3. Essential hypertension  controlled  4. Chronic combined systolic and diastolic heart failure  LVEF 45-50% on recent echo. This is not significantly changed compared to a year ago.    Current medicines are reviewed at length with the patient today.  The patient has the following concerns regarding medicines:  none.  The following changes/actions have been instituted:     increase isosorbide to 90 mg per day. If this controls angina and no further adjustments. We will have the option to increase to 120 mg per day but need to be careful in the setting of severe left ear   I believe the most recent increase in symptoms is due to bypass graft disease/ in-stent restenosis. I have recommended repeat coronary angiography and graft PCI. The patient is not convinced that he wants to have more instrumentation  Labs/ tests ordered today include:  No orders of the defined types were placed in this encounter.     Disposition:   FU with HS in 1 week  Signed, [name], MD  07/12/2015 5:11 PM    [address], [place], [place]  [address] Phone: [phone]; Fax: [phone]

## 2015-08-02 ENCOUNTER — Ambulatory Visit: Payer: PRIVATE HEALTH INSURANCE | Admitting: Interventional Cardiology

## 2015-08-04 ENCOUNTER — Ambulatory Visit: Payer: PRIVATE HEALTH INSURANCE | Admitting: Interventional Cardiology

## 2015-08-23 DIAGNOSIS — R972 Elevated prostate specific antigen [PSA]: Secondary | ICD-10-CM | POA: Diagnosis not present

## 2015-09-22 DIAGNOSIS — M79674 Pain in right toe(s): Secondary | ICD-10-CM | POA: Diagnosis not present

## 2015-10-11 ENCOUNTER — Other Ambulatory Visit: Payer: Self-pay | Admitting: Interventional Cardiology

## 2015-11-02 DIAGNOSIS — N401 Enlarged prostate with lower urinary tract symptoms: Secondary | ICD-10-CM | POA: Diagnosis not present

## 2015-11-02 DIAGNOSIS — Z0000 Encounter for general adult medical examination without abnormal findings: Secondary | ICD-10-CM | POA: Diagnosis not present

## 2015-11-02 DIAGNOSIS — R338 Other retention of urine: Secondary | ICD-10-CM | POA: Diagnosis not present

## 2015-11-02 DIAGNOSIS — N138 Other obstructive and reflux uropathy: Secondary | ICD-10-CM | POA: Diagnosis not present

## 2015-11-13 ENCOUNTER — Other Ambulatory Visit: Payer: Self-pay | Admitting: Interventional Cardiology

## 2015-11-22 DIAGNOSIS — M25562 Pain in left knee: Secondary | ICD-10-CM | POA: Diagnosis not present

## 2016-01-03 DIAGNOSIS — R600 Localized edema: Secondary | ICD-10-CM | POA: Diagnosis not present

## 2016-01-03 DIAGNOSIS — R339 Retention of urine, unspecified: Secondary | ICD-10-CM | POA: Diagnosis not present

## 2016-01-03 DIAGNOSIS — I951 Orthostatic hypotension: Secondary | ICD-10-CM | POA: Diagnosis not present

## 2016-01-03 DIAGNOSIS — I25119 Atherosclerotic heart disease of native coronary artery with unspecified angina pectoris: Secondary | ICD-10-CM | POA: Diagnosis not present

## 2016-01-03 DIAGNOSIS — N400 Enlarged prostate without lower urinary tract symptoms: Secondary | ICD-10-CM | POA: Diagnosis not present

## 2016-01-03 DIAGNOSIS — E782 Mixed hyperlipidemia: Secondary | ICD-10-CM | POA: Diagnosis not present

## 2016-01-03 DIAGNOSIS — E559 Vitamin D deficiency, unspecified: Secondary | ICD-10-CM | POA: Diagnosis not present

## 2016-01-03 DIAGNOSIS — I350 Nonrheumatic aortic (valve) stenosis: Secondary | ICD-10-CM | POA: Diagnosis not present

## 2016-01-03 DIAGNOSIS — G4700 Insomnia, unspecified: Secondary | ICD-10-CM | POA: Diagnosis not present

## 2016-01-03 DIAGNOSIS — I10 Essential (primary) hypertension: Secondary | ICD-10-CM | POA: Diagnosis not present

## 2016-01-03 DIAGNOSIS — Z79899 Other long term (current) drug therapy: Secondary | ICD-10-CM | POA: Diagnosis not present

## 2016-01-03 DIAGNOSIS — E039 Hypothyroidism, unspecified: Secondary | ICD-10-CM | POA: Diagnosis not present

## 2016-01-11 DIAGNOSIS — H353131 Nonexudative age-related macular degeneration, bilateral, early dry stage: Secondary | ICD-10-CM | POA: Diagnosis not present

## 2016-01-14 ENCOUNTER — Other Ambulatory Visit: Payer: Self-pay | Admitting: Interventional Cardiology

## 2016-01-17 NOTE — Telephone Encounter (Signed)
Please advise as to what current dose should be. Thanks, MI

## 2016-01-19 NOTE — Telephone Encounter (Signed)
Spoke with pt and his wife. Refill rqst received from pt pharmacy for Isosorbide. Pt Isosorbide was increased to 90mg  qd when pt was seen in Oct 2016 by Dr.Smith. Pt wife reports that pt has been taking Isosorbide 60mg  bid, because they were unable to cut the tablets in half. Pt is tolerating the higher dose ok. Adv pt hat I will need to fwd Dr.Smith an update for approval to change pt Rx. Pt sts that he only has a 1 day supply left. Adv pt that I will send in a minimum amount to his pharmacy so he wont run out, and extend the prescription once I hear back from [place]. Pt verbalized understanding.

## 2016-01-20 NOTE — Telephone Encounter (Signed)
Change to Imdur 120 mg daily with 30 or 90 day refills to last 1 year.

## 2016-01-23 NOTE — Telephone Encounter (Signed)
Rx sent to pt pharmacy Imdur 120mg  po qd #90 R-3

## 2016-01-31 ENCOUNTER — Telehealth: Payer: Self-pay | Admitting: Interventional Cardiology

## 2016-01-31 DIAGNOSIS — Z01812 Encounter for preprocedural laboratory examination: Secondary | ICD-10-CM

## 2016-01-31 NOTE — Telephone Encounter (Signed)
New Message  Was Dr. Tamala Julian recommending a CATH if so the pt is ready and would like a call back to discuss.

## 2016-02-01 ENCOUNTER — Other Ambulatory Visit: Payer: Self-pay | Admitting: Interventional Cardiology

## 2016-02-01 DIAGNOSIS — I25810 Atherosclerosis of coronary artery bypass graft(s) without angina pectoris: Secondary | ICD-10-CM

## 2016-02-01 NOTE — Telephone Encounter (Signed)
Pt and pt daughter Jackelyn Poling aware of Dr.Smith's response Schedule for left and right heart cath with coronary and bypass graft angiogram and possible PCI. Depending upon findings would may be able to perform stent but there is no guarantee.  Cardiac cath scheduled on 5/9 @ 9am with Dr.Smith Pt will come to the office on 5/3 for pre-procedure lab. Jackelyn Poling will come to the office to pick up written pre-procedure instructions, adv her they will be left at the front desk [name] sts that the pt has changes insurance co, she wil bring a copy of the pt' new ins card to be scanned into his chart.  [name] voiced appreciation and verbalized understanding.

## 2016-02-01 NOTE — Telephone Encounter (Signed)
Returned call to pt daughter Jackelyn Poling. [name] called to report that the pt is having increased SOB at rest and with exertion, denies syncope, or chest pain. Pt has expressed that he is ready to proceed with the cardiac cath as recommended by Dr. Tamala Julian 6 months ago. Pt has an appt scheduled with Dr.Smith on 5/15. Adv her that I will need to fwd an update to Dr.Smith, to see if he sh and call back with his recommendation.

## 2016-02-01 NOTE — Telephone Encounter (Signed)
Schedule for left and right heart cath with coronary and bypass graft angiogram and possible PCI. Depending upon findings would may be able to perform stent but there is no guarantee.

## 2016-02-04 ENCOUNTER — Emergency Department (HOSPITAL_BASED_OUTPATIENT_CLINIC_OR_DEPARTMENT_OTHER): Payer: Medicare Other

## 2016-02-04 ENCOUNTER — Emergency Department (HOSPITAL_BASED_OUTPATIENT_CLINIC_OR_DEPARTMENT_OTHER)
Admission: EM | Admit: 2016-02-04 | Discharge: 2016-02-04 | Disposition: A | Discharge status: Home / Self Care | Payer: Medicare Other | Attending: Emergency Medicine | Admitting: Emergency Medicine

## 2016-02-04 ENCOUNTER — Encounter (HOSPITAL_BASED_OUTPATIENT_CLINIC_OR_DEPARTMENT_OTHER): Payer: Self-pay | Admitting: *Deleted

## 2016-02-04 DIAGNOSIS — E039 Hypothyroidism, unspecified: Secondary | ICD-10-CM | POA: Insufficient documentation

## 2016-02-04 DIAGNOSIS — Y999 Unspecified external cause status: Secondary | ICD-10-CM | POA: Diagnosis not present

## 2016-02-04 DIAGNOSIS — Y92007 Garden or yard of unspecified non-institutional (private) residence as the place of occurrence of the external cause: Secondary | ICD-10-CM | POA: Insufficient documentation

## 2016-02-04 DIAGNOSIS — E785 Hyperlipidemia, unspecified: Secondary | ICD-10-CM | POA: Insufficient documentation

## 2016-02-04 DIAGNOSIS — M170 Bilateral primary osteoarthritis of knee: Secondary | ICD-10-CM | POA: Diagnosis not present

## 2016-02-04 DIAGNOSIS — C8580 Other specified types of non-Hodgkin lymphoma, unspecified site: Secondary | ICD-10-CM | POA: Insufficient documentation

## 2016-02-04 DIAGNOSIS — I110 Hypertensive heart disease with heart failure: Secondary | ICD-10-CM | POA: Insufficient documentation

## 2016-02-04 DIAGNOSIS — S52502A Unspecified fracture of the lower end of left radius, initial encounter for closed fracture: Secondary | ICD-10-CM

## 2016-02-04 DIAGNOSIS — I214 Non-ST elevation (NSTEMI) myocardial infarction: Secondary | ICD-10-CM | POA: Insufficient documentation

## 2016-02-04 DIAGNOSIS — Y93H2 Activity, gardening and landscaping: Secondary | ICD-10-CM | POA: Insufficient documentation

## 2016-02-04 DIAGNOSIS — Z79899 Other long term (current) drug therapy: Secondary | ICD-10-CM | POA: Diagnosis not present

## 2016-02-04 DIAGNOSIS — W010XXA Fall on same level from slipping, tripping and stumbling without subsequent striking against object, initial encounter: Secondary | ICD-10-CM | POA: Insufficient documentation

## 2016-02-04 DIAGNOSIS — I2510 Atherosclerotic heart disease of native coronary artery without angina pectoris: Secondary | ICD-10-CM | POA: Diagnosis not present

## 2016-02-04 DIAGNOSIS — Z87891 Personal history of nicotine dependence: Secondary | ICD-10-CM | POA: Insufficient documentation

## 2016-02-04 DIAGNOSIS — S4992XA Unspecified injury of left shoulder and upper arm, initial encounter: Secondary | ICD-10-CM | POA: Diagnosis present

## 2016-02-04 DIAGNOSIS — I509 Heart failure, unspecified: Secondary | ICD-10-CM | POA: Diagnosis not present

## 2016-02-04 DIAGNOSIS — S52592A Other fractures of lower end of left radius, initial encounter for closed fracture: Secondary | ICD-10-CM | POA: Diagnosis not present

## 2016-02-04 MED ORDER — HYDROCODONE-ACETAMINOPHEN 5-325 MG PO TABS: 0.5000 | ORAL_TABLET | Freq: Four times a day (QID) | ORAL | Status: DC | PRN

## 2016-02-04 MED ORDER — HYDROCODONE-ACETAMINOPHEN 5-325 MG PO TABS
0.5000 | ORAL_TABLET | Freq: Once | ORAL | Status: AC
  Administered 2016-02-04: 0.5 via ORAL
  Filled 2016-02-04: qty 1

## 2016-02-04 NOTE — ED Notes (Signed)
Pt states he tripped over a flowering bed in yard. Landed on Left wrist

## 2016-02-04 NOTE — ED Notes (Signed)
Left wrist swollen with deformity noted, able to move fingers, has normal sensation of left digits

## 2016-02-04 NOTE — ED Notes (Signed)
Fell 1 hour ago and landed on left wrist. Pain and swelling noted

## 2016-02-04 NOTE — Discharge Instructions (Signed)
Cast or Splint Care °Casts and splints support injured limbs and keep bones from moving while they heal. It is important to care for your cast or splint at home.   °HOME CARE INSTRUCTIONS °· Keep the cast or splint uncovered during the drying period. It can take 24 to 48 hours to dry if it is made of plaster. A fiberglass cast will dry in less than 1 hour. °· Do not rest the cast on anything harder than a pillow for the first 24 hours. °· Do not put weight on your injured limb or apply pressure to the cast until your health care provider gives you permission. °· Keep the cast or splint dry. Wet casts or splints can lose their shape and may not support the limb as well. A wet cast that has lost its shape can also create harmful pressure on your skin when it dries. Also, wet skin can become infected. °· Cover the cast or splint with a plastic bag when bathing or when out in the rain or snow. If the cast is on the trunk of the body, take sponge baths until the cast is removed. °· If your cast does become wet, dry it with a towel or a blow dryer on the cool setting only. °· Keep your cast or splint clean. Soiled casts may be wiped with a moistened cloth. °· Do not place any hard or soft foreign objects under your cast or splint, such as cotton, toilet paper, lotion, or powder. °· Do not try to scratch the skin under the cast with any object. The object could get stuck inside the cast. Also, scratching could lead to an infection. If itching is a problem, use a blow dryer on a cool setting to relieve discomfort. °· Do not trim or cut your cast or remove padding from inside of it. °· Exercise all joints next to the injury that are not immobilized by the cast or splint. For example, if you have a long leg cast, exercise the hip joint and toes. If you have an arm cast or splint, exercise the shoulder, elbow, thumb, and fingers. °· Elevate your injured arm or leg on 1 or 2 pillows for the first 1 to 3 days to decrease  swelling and pain. It is best if you can comfortably elevate your cast so it is higher than your heart. °SEEK MEDICAL CARE IF:  °· Your cast or splint cracks. °· Your cast or splint is too tight or too loose. °· You have unbearable itching inside the cast. °· Your cast becomes wet or develops a soft spot or area. °· You have a bad smell coming from inside your cast. °· You get an object stuck under your cast. °· Your skin around the cast becomes red or raw. °· You have new pain or worsening pain after the cast has been applied. °SEEK IMMEDIATE MEDICAL CARE IF:  °· You have fluid leaking through the cast. °· You are unable to move your fingers or toes. °· You have discolored (blue or white), cool, painful, or very swollen fingers or toes beyond the cast. °· You have tingling or numbness around the injured area. °· You have severe pain or pressure under the cast. °· You have any difficulty with your breathing or have shortness of breath. °· You have chest pain. °  °This information is not intended to replace advice given to you by your health care provider. Make sure you discuss any questions you have with your health care   provider. °  °Document Released: 09/21/2000 Document Revised: 07/15/2013 Document Reviewed: 04/02/2013 °Elsevier Interactive Patient Education ©2016 [employer] ° °Wrist Fracture °A wrist fracture is a break or crack in one of the bones of your wrist. Your wrist is made up of eight small bones at the palm of your hand (carpal bones) and two long bones that make up your forearm (radius and ulna). °CAUSES °· A direct blow to the wrist. °· Falling on an outstretched hand. °· Trauma, such as a car accident or a fall. °RISK FACTORS °Risk factors for wrist fracture include: °· Participating in contact and high-risk sports, such as skiing, biking, and ice skating. °· Taking steroid medicines. °· Smoking. °· Being male. °· Being Caucasian. °· Drinking more than three alcoholic beverages per  day. °· Having low or lowered bone density (osteoporosis or osteopenia). °· Age. Older adults have decreased bone density. °· Women who have had menopause. °· History of previous fractures. °SIGNS AND SYMPTOMS °Symptoms of wrist fractures include tenderness, bruising, and inflammation. Additionally, the wrist may hang in an odd position or appear deformed. °DIAGNOSIS °Diagnosis may include: °· Physical exam. °· X-ray. °TREATMENT °Treatment depends on many factors, including the nature and location of the fracture, your age, and your activity level. Treatment for wrist fracture can be nonsurgical or surgical. °Nonsurgical Treatment °A plaster cast or splint may be applied to your wrist if the bone is in a good position. If the fracture is not in good position, it may be necessary for your health care provider to realign it before applying a splint or cast. Usually, a cast or splint will be worn for several weeks. °Surgical Treatment °Sometimes the position of the bone is so far out of place that surgery is required to apply a device to hold it together as it heals. Depending on the fracture, there are a number of options for holding the bone in place while it heals, such as a cast and metal pins. °HOME CARE INSTRUCTIONS °· Keep your injured wrist elevated and move your fingers as much as possible. °· Do not put pressure on any part of your cast or splint. It may break. °· Use a plastic bag to protect your cast or splint from water while bathing or showering. Do not lower your cast or splint into water. °· Take medicines only as directed by your health care provider. °· Keep your cast or splint clean and dry. If it becomes wet, damaged, or suddenly feels too tight, contact your health care provider right away. °· Do not use any tobacco products including cigarettes, chewing tobacco, or electronic cigarettes. Tobacco can delay bone healing. If you need help quitting, ask your health care provider. °· Keep all follow-up  visits as directed by your health care provider. This is important. °· Ask your health care provider if you should take supplements of calcium and vitamins C and D to promote bone healing. °SEEK MEDICAL CARE IF: °· Your cast or splint is damaged, breaks, or gets wet. °· You have a fever. °· You have chills. °· You have continued severe pain or more swelling than you did before the cast was put on. °SEEK IMMEDIATE MEDICAL CARE IF: °· Your hand or fingernails on the injured arm turn blue or gray, or feel cold or numb. °· You have decreased feeling in the fingers of your injured arm. °MAKE SURE YOU: °· Understand these instructions. °· Will watch your condition. °· Will get help right away if you are   not doing well or get worse. °  °This information is not intended to replace advice given to you by your health care provider. Make sure you discuss any questions you have with your health care provider. °  °Document Released: 07/04/2005 Document Revised: 06/15/2015 Document Reviewed: 10/12/2011 °Elsevier Interactive Patient Education ©2016 [employer] ° °

## 2016-02-04 NOTE — ED Notes (Signed)
Ice pack and elevation implemented to left wrist for comfort

## 2016-02-04 NOTE — ED Provider Notes (Signed)
CSN: [id number]     Arrival date & time 02/04/16  1415 History  By signing my name below, I, Tim Campbell, attest that this documentation has been prepared under the direction and in the presence of Tim Reichert, MD. Electronically Signed: Altamease Campbell, ED Scribe. 02/04/2016. 3:41 PM   Chief Complaint  Patient presents with  . Arm Injury    The history is provided by the patient. No language interpreter was used.   Tim Campbell is a 82 y.o. right-hand dominant male who presents to the Emergency Department for evaluation after a fall approximately 2.5 hours ago. The pt was gardening in his yard and tripped, breaking the fall with the left arm. Associated symptoms include left wrist pain, swelling, and deformity. Pt denies head injury LOC, left elbow pain, and left shoulder pain. Pt is on aspirin and another blood thinner that he cannot call by name.   Past Medical History  Diagnosis Date  . Coronary artery disease     with occluded SVG to diagonal and BMS to prox SVG of RCA 10/2010. Recth 12/2012, 70% ISR SVG to RCA, 50% SVG to Cfx, 100% SVG to diagonal, and Patent LIMA with diffuse LAD dz.  . Hypertension   . Non-ST elevation MI (NSTEMI) ([place])   . Hyperlipidemia   . Insomnia   . Vitamin D deficiency   . Aortic stenosis     severe by echo September 2014  . Hypothyroidism   . RBBB   . Diverticulosis   . Non Hodgkin's lymphoma ([place])   . Enlarged prostate     per patient  with mild effect on urine stream  . Arthritis     osteo knees "   . Chronic combined systolic and diastolic HF (heart failure), NYHA class 3 09/09/2013    LVEF 45%.    Past Surgical History  Procedure Laterality Date  . Ankle surgery    . Coronary artery bypass graft  1993  . Hemorrhoid surgery    . Right inguinal hernia repair    . Coronary stent placement  04/21/2014    bare metal SVG   DR [facility]  . Knee arthroscopy Right 1970'S   . Left heart catheterization with coronary/graft angiogram N/A  01/02/2013    Procedure: LEFT HEART CATHETERIZATION WITH [name];  Surgeon: [name], MD;  Location[facility] CATH LAB;  Service: Cardiovascular;  Laterality: N/A;  . Left and right heart catheterization with coronary/graft angiogram N/A 04/05/2014    Procedure: LEFT AND RIGHT HEART CATHETERIZATION WITH [name];  Surgeon: Burnell Blanks, MD;  Location: [facility] CATH LAB;  Service: Cardiovascular;  Laterality: N/A;  . Percutaneous coronary stent intervention (pci-s) N/A 04/21/2014    Procedure: PERCUTANEOUS CORONARY STENT INTERVENTION (PCI-S);  Surgeon: [name], MD;  Location: [facility] CATH LAB;  Service: Cardiovascular;  Laterality: N/A;   Family History  Problem Relation Age of Onset  . Colon cancer Father   . Stroke Mother   . CAD Brother   . Stroke Sister   . Bone cancer Brother   . Parkinson's disease Brother   . Dementia Sister   . Stroke Sister    Social History  Substance Use Topics  . Smoking status: Former Smoker -- 0.50 packs/day for 15 years    Types: Cigarettes  . Smokeless tobacco: Never Used     Comment: quit smoking  in 1962   . Alcohol Use: No    Review of Systems  Musculoskeletal: Positive for  arthralgias.       Left wrist pain, swelling, and deformity  Skin: Negative for wound.  Neurological: Negative for syncope.   Allergies  Review of patient's allergies indicates no known allergies.  Home Medications   Prior to Admission medications   Medication Sig Start Date End Date Taking? Authorizing Provider  Ascorbic Acid (VITAMIN C) 1000 MG tablet Take 1,000 mg by mouth every other day.    Historical Provider, MD  aspirin EC 81 MG tablet Take 81 mg by mouth daily.    Historical Provider, MD  atorvastatin (LIPITOR) 40 MG tablet Take 1 tablet (40 mg total) by mouth daily at 6 PM. 03/28/15   Belva Crome, MD  B Complex-C (B-COMPLEX WITH VITAMIN C) tablet Take 1 tablet by mouth 3 (three) times a week.    Historical Provider,  MD  CHLOROPHYLL PO Take 2 tablets by mouth daily.    Historical Provider, MD  Coenzyme Q10 (CO Q 10 PO) Take 1 capsule by mouth daily.    Historical Provider, MD  finasteride (PROSCAR) 5 MG tablet Take 5 mg by mouth daily. 07/04/15   Historical Provider, MD  folic acid (FOLVITE) Q000111Q MCG tablet Take 800 mcg by mouth every other day.    Historical Provider, MD  Garlic [id number] MG TABS Take 500 mg by mouth every other day.    Historical Provider, MD  hydrochlorothiazide (MICROZIDE) 12.5 MG capsule TAKE 1 CAPSULE (12.5 MG TOTAL) BY MOUTH DAILY. 11/14/15   Belva Crome, MD  HYDROcodone-acetaminophen (NORCO/VICODIN) 5-325 MG tablet Take 0.5-1 tablets by mouth every 6 (six) hours as needed. 02/04/16   Tim Reichert, MD  isosorbide mononitrate (IMDUR) 120 MG 24 hr tablet Take 1 tablet (120 mg total) by mouth daily. 01/23/16   Belva Crome, MD  levothyroxine (SYNTHROID, LEVOTHROID) 125 MCG tablet Take 125 mcg by mouth daily before breakfast.  11/23/14   Historical Provider, MD  Magnesium 200 MG TABS Take 200 mg by mouth daily.    Historical Provider, MD  metoprolol tartrate (LOPRESSOR) 25 MG tablet Take 25 mg by mouth daily.    Historical Provider, MD  Multiple Vitamins-Iron (CHLORELLA PO) Take 4 tablets by mouth 2 (two) times daily. MED NAME: CHLORELLA    Historical Provider, MD  NAT-RUL PSYLLIUM SEED HUSKS PO Take 610 mg by mouth 2 (two) times daily.    Historical Provider, MD  nitroGLYCERIN (NITROSTAT) 0.4 MG SL tablet Place 1 tablet (0.4 mg total) under the tongue every 5 (five) minutes as needed for chest pain. 01/05/15   Belva Crome, MD  OVER THE COUNTER MEDICATION Take 1 tablet by mouth daily. MED NAME: QUANTUM HEART MAX    Historical Provider, MD  OVER THE COUNTER MEDICATION Take 1,200 mg by mouth every other day. MED NAME: SUNFLOWER LECITHIN    Historical Provider, MD  Potassium 99 MG TABS Take 1 tablet by mouth every other day.    Historical Provider, MD  SELENIUM PO Take 1 tablet by mouth every other  day.    Historical Provider, MD  tamsulosin (FLOMAX) 0.4 MG CAPS capsule Take 1 capsule (0.4 mg total) by mouth daily. 06/01/15   Debby Freiberg, MD  VITAMIN D, ERGOCALCIFEROL, PO Take 1 tablet by mouth daily. Patient takes 3-4 times weekly    Historical Provider, MD   BP 109/51 mmHg  Pulse 58  Temp(Src) 98 F (36.7 C) (Oral)  Resp 18  Ht 5\' 10"  (1.778 m)  Wt 222 lb (100.699 kg)  BMI 31.85  kg/m2  SpO2 93% Physical Exam  Constitutional: He is oriented to person, place, and time. He appears well-developed and well-nourished.  HENT:  Head: Normocephalic and atraumatic.  Cardiovascular: Normal rate and regular rhythm.   Murmur heard. High pitched systolic ejection murmur  Pulmonary/Chest: Effort normal and breath sounds normal. No respiratory distress.  Abdominal: Soft. There is no tenderness. There is no rebound and no guarding.  Musculoskeletal:  2+ pitting edema BLEs Left wrist:  2+ radial pulse Sensation to light touch intact Flexion and extension intact to all digits of the left hand Significant swelling and deformity to wrist and distal forearm with mild local tenderness   Neurological: He is alert and oriented to person, place, and time.  Skin: Skin is warm and dry.  Psychiatric: He has a normal mood and affect. His behavior is normal.  Nursing note and vitals reviewed.   ED Course  Procedures (including critical care time) DIAGNOSTIC STUDIES: Oxygen Saturation is 93% on RA, adequate by my interpretation.    COORDINATION OF CARE: 3:37 PM Discussed treatment plan which includes left wrist XR with pt at bedside and pt agreed to plan.  4:23 PM-Consult complete with Dr. Caralyn Guile (Hand Surgery). Patient case explained and discussed.  Labs Review Labs Reviewed - No data to display  Imaging Review Dg Wrist Complete Left  02/04/2016  CLINICAL DATA:  Fall 2 hours ago landing on left wrist. Pain and deformity. EXAM: LEFT WRIST - COMPLETE 3+ VIEW COMPARISON:  None. FINDINGS:  There is a significantly displaced/ comminuted fracture of the distal left radius, with fracture line extension to the articular surface and at least some degree of impaction at the fracture site. There is associated dorsal angulation at radiocarpal joint space. Additional slightly displaced fracture noted at the base of the ulnar styloid. There is a questionable fracture of the scaphoid bone, mid to posterior pole. Remainder of the carpal bones appear intact and normally aligned. IMPRESSION: 1. Displaced/comminuted fractures of the distal left radius, with extension to the articular surface, with impaction at the main fracture site, and with associated dorsal tilt at the radiocarpal joint. 2. Slightly displaced fracture at the base of the left ulnar styloid. 3. Suspect nondisplaced fracture of the left scaphoid bone, mid to posterior pole. Electronically Signed   By: Franki Cabot M.D.   On: 02/04/2016 15:17   I have personally reviewed and evaluated these images as part of my medical decision-making.   EKG Interpretation None      MDM   Final diagnoses:  Distal radius fracture, left, closed, initial encounter    Patient here for evaluation of injuries following a mechanical fall. He has a displaced left radius fracture. Discussed with Dr. Caralyn Guile with orthopedics who recommends splinting and outpatient follow-up. Discussed with patient findings of films and need for splinting and hand surgery follow-up. Home care and return precautions were discussed.  I personally performed the services described in this documentation, which was scribed in my presence. The recorded information has been reviewed and is accurate.    Tim Reichert, MD 02/05/16 0001

## 2016-02-07 DIAGNOSIS — S52531A Colles' fracture of right radius, initial encounter for closed fracture: Secondary | ICD-10-CM | POA: Diagnosis not present

## 2016-02-08 ENCOUNTER — Other Ambulatory Visit: Payer: Medicare Other

## 2016-02-08 ENCOUNTER — Telehealth: Payer: Self-pay | Admitting: Interventional Cardiology

## 2016-02-08 NOTE — Telephone Encounter (Signed)
Delay the heart cath until the arm is healed and Dr. Mayer Camel is satisfied with progress.

## 2016-02-08 NOTE — Telephone Encounter (Signed)
Will forward to Dr. Tamala Julian to see if he wants to proceed with cath on 02/14/16.

## 2016-02-08 NOTE — Telephone Encounter (Signed)
Follow Up

## 2016-02-08 NOTE — Telephone Encounter (Signed)
Tim Campbell is calling because her dad is schedule for a heart cath on 02/14/16 , but he fell on Saturday 02/04/16 and broke his wrist . They are concern that he may need surgery on his wrist , but saw Dr. Mayer Camel and when the cast came off he has blood blisters on his wrist . He sees Dr. Mayer Camel on 02/16/16 . She is wanting Dr. Tamala Julian to be aware of the issue with his wrist and want to know will he still proceed with the cath . Please call  Thanks

## 2016-02-08 NOTE — Telephone Encounter (Signed)
Called patient's daughter [name] [facility]) and informed her of Dr. Thompson Caul reply. Cancelled patient's heart cath and lab work appointment. Patient will follow-up with his already scheduled appointment.

## 2016-02-09 ENCOUNTER — Other Ambulatory Visit: Payer: Self-pay | Admitting: Cardiology

## 2016-02-14 ENCOUNTER — Encounter (HOSPITAL_COMMUNITY): Admission: RE | Payer: Self-pay | Source: Ambulatory Visit

## 2016-02-14 ENCOUNTER — Ambulatory Visit (HOSPITAL_COMMUNITY)
Admission: RE | Admit: 2016-02-14 | Payer: Medicare Other | Source: Ambulatory Visit | Admitting: Interventional Cardiology

## 2016-02-14 DIAGNOSIS — S52532D Colles' fracture of left radius, subsequent encounter for closed fracture with routine healing: Secondary | ICD-10-CM | POA: Diagnosis not present

## 2016-02-14 SURGERY — RIGHT/LEFT HEART CATH AND CORONARY/GRAFT ANGIOGRAPHY
Anesthesia: LOCAL

## 2016-02-15 DIAGNOSIS — S52531D Colles' fracture of right radius, subsequent encounter for closed fracture with routine healing: Secondary | ICD-10-CM | POA: Diagnosis not present

## 2016-02-20 ENCOUNTER — Ambulatory Visit (INDEPENDENT_AMBULATORY_CARE_PROVIDER_SITE_OTHER): Payer: Medicare Other | Admitting: Interventional Cardiology

## 2016-02-20 ENCOUNTER — Encounter: Payer: Self-pay | Admitting: Interventional Cardiology

## 2016-02-20 VITALS — BP 122/60 | HR 50 | Ht 70.0 in | Wt 215.4 lb

## 2016-02-20 DIAGNOSIS — I350 Nonrheumatic aortic (valve) stenosis: Secondary | ICD-10-CM

## 2016-02-20 DIAGNOSIS — I5032 Chronic diastolic (congestive) heart failure: Secondary | ICD-10-CM

## 2016-02-20 DIAGNOSIS — I25810 Atherosclerosis of coronary artery bypass graft(s) without angina pectoris: Secondary | ICD-10-CM

## 2016-02-20 DIAGNOSIS — Z01810 Encounter for preprocedural cardiovascular examination: Secondary | ICD-10-CM

## 2016-02-20 DIAGNOSIS — I10 Essential (primary) hypertension: Secondary | ICD-10-CM

## 2016-02-20 NOTE — Patient Instructions (Signed)
Medication Instructions:  Your physician recommends that you continue on your current medications as directed. Please refer to the Current Medication list given to you today.   Labwork: None ordered  Testing/Procedures: None ordered  Follow-Up: Please let us know when you have recovered from your arm surgery, when can then proceed with the cardiac cath  Any Other Special Instructions Will Be Listed Below (If Applicable).     If you need a refill on your cardiac medications before your next appointment, please call your pharmacy.

## 2016-02-20 NOTE — Progress Notes (Signed)
Cardiology Office Note    Date:  02/20/2016   ID:  Tim Campbell, DOB June 14, 1926, MRN [id number]  PCP:  [name], MD  Cardiologist: [name], MD   Chief Complaint  Patient presents with  . Coronary Artery Disease  . Aortic Stenosis    History of Present Illness:  Tim Campbell is a 82 y.o. male with moderately severe to severe aortic stenosis (3.9 m/s transvalvular velocity September 2016), chronic combined systolic and diastolic heart failure (EF 45% 2016), coronary artery disease with bypass graft failure including stents in the saphenous vein graft to the right coronary (remote) and to the diagonal (2015), hypertension, right bundle branch block, and prior history of myocardial infarction.  The patient had a recent fall, due to loss of balance and not syncope. He fractured his left wrist. This is Treated by Dr. Milly Jakob. He may require surgery. The family notices a several month history of gradual increasing shortness of breath new and different compared to a year ago. The patient has become less physically active since fracturing his left wrist. He does state that walking 300 feet to his mailbox causes shortness of breath that requires him to stop and rest. He has always had some degree of shortness of breath with this activity. It does not precipitate chest discomfort. He denies orthopnea and PND. He has chronic bilateral lower extremity edema. He has been compliant with his medical regimen. He denies any recent nitroglycerin use. He has not had syncope.  The wife and daughter have decided that he needs heart catheterization because of exertional dyspnea and decreasing exertional tolerance. We will planning to proceed with reevaluation to exclude bypass graft failure and progression of aortic stenosis until he fractured his left wrist. I put the planned catheterization on hold because it was not precipitated by a sudden change in his overall clinical  cardiovascular condition.  Past Medical History  Diagnosis Date  . Coronary artery disease     with occluded SVG to diagonal and BMS to prox SVG of RCA 10/2010. Recth 12/2012, 70% ISR SVG to RCA, 50% SVG to Cfx, 100% SVG to diagonal, and Patent LIMA with diffuse LAD dz.  . Hypertension   . Non-ST elevation MI (NSTEMI) ([place])   . Hyperlipidemia   . Insomnia   . Vitamin D deficiency   . Aortic stenosis     severe by echo September 2014  . Hypothyroidism   . RBBB   . Diverticulosis   . Non Hodgkin's lymphoma ([place])   . Enlarged prostate     per patient  with mild effect on urine stream  . Arthritis     osteo knees "   . Chronic combined systolic and diastolic HF (heart failure), NYHA class 3 09/09/2013    LVEF 45%.     Past Surgical History  Procedure Laterality Date  . Ankle surgery    . Coronary artery bypass graft  1993  . Hemorrhoid surgery    . Right inguinal hernia repair    . Coronary stent placement  04/21/2014    bare metal SVG   DR [facility]  . Knee arthroscopy Right 1970'S   . Left heart catheterization with coronary/graft angiogram N/A 01/02/2013    Procedure: LEFT HEART CATHETERIZATION WITH [name];  Surgeon: [name], MD;  Location: [facility] CATH LAB;  Service: Cardiovascular;  Laterality: N/A;  . Left and right heart catheterization with coronary/graft angiogram N/A 04/05/2014    Procedure: LEFT  AND RIGHT HEART CATHETERIZATION WITH [name];  Surgeon: Burnell Blanks, MD;  Location: [facility] CATH LAB;  Service: Cardiovascular;  Laterality: N/A;  . Percutaneous coronary stent intervention (pci-s) N/A 04/21/2014    Procedure: PERCUTANEOUS CORONARY STENT INTERVENTION (PCI-S);  Surgeon: [name], MD;  Location: [facility] CATH LAB;  Service: Cardiovascular;  Laterality: N/A;    Current Medications: Outpatient Prescriptions Prior to Visit  Medication Sig Dispense Refill  . Ascorbic Acid (VITAMIN C) 1000 MG tablet Take 1,000 mg by mouth  every other day.    Marland Kitchen aspirin EC 81 MG tablet Take 81 mg by mouth daily.    Marland Kitchen atorvastatin (LIPITOR) 40 MG tablet Take 1 tablet (40 mg total) by mouth daily at 6 PM. 30 tablet 11  . B Complex-C (B-COMPLEX WITH VITAMIN C) tablet Take 1 tablet by mouth 3 (three) times a week.    . CHLOROPHYLL PO Take 2 tablets by mouth daily.    . Coenzyme Q10 (CO Q 10 PO) Take 1 capsule by mouth daily.    . finasteride (PROSCAR) 5 MG tablet Take 5 mg by mouth daily.  11  . folic acid (FOLVITE) Q000111Q MCG tablet Take 800 mcg by mouth every other day.    . Garlic [id number] MG TABS Take 500 mg by mouth every other day.    . hydrochlorothiazide (MICROZIDE) 12.5 MG capsule TAKE 1 CAPSULE (12.5 MG TOTAL) BY MOUTH DAILY. 30 capsule 6  . HYDROcodone-acetaminophen (NORCO/VICODIN) 5-325 MG tablet Take 0.5-1 tablets by mouth every 6 (six) hours as needed. 10 tablet 0  . levothyroxine (SYNTHROID, LEVOTHROID) 125 MCG tablet Take 125 mcg by mouth daily before breakfast.   3  . Magnesium 200 MG TABS Take 200 mg by mouth daily.    . metoprolol tartrate (LOPRESSOR) 25 MG tablet Take 25 mg by mouth daily.    . Multiple Vitamins-Iron (CHLORELLA PO) Take 4 tablets by mouth 2 (two) times daily. MED NAME: CHLORELLA    . NAT-RUL PSYLLIUM SEED HUSKS PO Take 610 mg by mouth 2 (two) times daily.    . nitroGLYCERIN (NITROSTAT) 0.4 MG SL tablet Place 1 tablet (0.4 mg total) under the tongue every 5 (five) minutes as needed for chest pain. 25 tablet 11  . OVER THE COUNTER MEDICATION Take 1 tablet by mouth daily. MED NAME: QUANTUM HEART MAX    . OVER THE COUNTER MEDICATION Take 1,200 mg by mouth every other day. MED NAME: SUNFLOWER LECITHIN    . Potassium 99 MG TABS Take 1 tablet by mouth every other day.    . SELENIUM PO Take 1 tablet by mouth every other day.    . tamsulosin (FLOMAX) 0.4 MG CAPS capsule Take 1 capsule (0.4 mg total) by mouth daily. 30 capsule 0  . VITAMIN D, ERGOCALCIFEROL, PO Take 1 tablet by mouth daily. Patient takes 3-4 times  weekly    . isosorbide mononitrate (IMDUR) 120 MG 24 hr tablet Take 1 tablet (120 mg total) by mouth daily. (Patient not taking: Reported on 02/20/2016) 90 tablet 3  . metoprolol tartrate (LOPRESSOR) 25 MG tablet TAKE 1 TABLET (25 MG TOTAL) BY MOUTH 2 (TWO) TIMES DAILY. (Patient not taking: Reported on 02/20/2016) 60 tablet 5   No facility-administered medications prior to visit.     Allergies:   Review of patient's allergies indicates no known allergies.   Social History   Social History  . Marital Status: Married    Spouse Name: N/A  . Number of Children: 3  .  Years of Education: N/A   Occupational History  . 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 Smoking status: Former Smoker -- 0.50 packs/day for 15 years    Types: Cigarettes  . Smokeless tobacco: Never Used     Comment: quit smoking  in 1962   . Alcohol Use: No  . Drug Use: No  . Sexual Activity: Not Asked   Other Topics Concern  . None   Social History Narrative     Family History:  The patient's family history includes Bone cancer in his brother; CAD in his brother; Colon cancer in his father; Dementia in his sister; Parkinson's disease in his brother; Stroke in his mother, sister, and sister.   ROS:   Please see the history of present illness.    Leg pain and weakness with fatigue. Frequent urination and hesitancy.  All other systems reviewed and are negative.   PHYSICAL EXAM:   VS:  BP 122/60 mmHg  Pulse 50  Ht 5\' 10"  (1.778 m)  Wt 215 lb 6.4 oz (97.705 kg)  BMI 30.91 kg/m2   GEN: Well nourished, well developed, in no acute distress HEENT: normal Neck: no JVD, carotid bruits, or masses Cardiac: RRR; no murmurs, rubs, or gallops, and 2+ bilateral lower extremity edema  Respiratory:  clear to auscultation bilaterally, normal work of breathing GI: soft, nontender, nondistended, + BS MS: no deformity or atrophy Skin: warm and dry, no rash Neuro:  Alert and Oriented x 3, Strength  and sensation are intact Psych: euthymic mood, full affect  Wt Readings from Last 3 Encounters:  02/20/16 215 lb 6.4 oz (97.705 kg)  02/04/16 222 lb (100.699 kg)  07/12/15 222 lb 6.4 oz (100.88 kg)      Studies/Labs Reviewed:   EKG:  EKG  Is not repeated. The last tracing performed on 07/12/15 revealed incomplete right bundle, sinus bradycardia, first-degree AV block and an old inferior infarct.  Recent Labs: 06/01/2015: BUN 16; Creatinine, Ser 0.91; Hemoglobin 15.0; Platelets 150; Potassium 5.1; Sodium 140   Lipid Panel No results found for: CHOL, TRIG, HDL, CHOLHDL, VLDL, LDLCALC, LDLDIRECT  Additional studies/ records that were reviewed today include:   Echocardiogram 07/07/15  Study Conclusions  - Left ventricle: Global hypokinesis worse in the inferolateral and  inferior walls. The cavity size was normal. There was mild  concentric hypertrophy. Systolic function was mildly reduced. The  estimated ejection fraction was in the range of 45% to 50%.  Doppler parameters are consistent with abnormal left ventricular  relaxation (grade 1 diastolic dysfunction). - Aortic valve: Trileaflet; normal thickness, moderately calcified  leaflets. There was moderate stenosis. There was mild  regurgitation. Peak velocity (S): 392 cm/s. Mean gradient (S): 33  mm Hg. Valve area (VTI): 1.28 cm^2. Valve area (Vmax): 1.14 cm^2.  Valve area (Vmean): 1.03 cm^2.  - Mitral valve: Calcified annulus. There was trivial regurgitation. - Left atrium: The atrium was severely dilated. - Right ventricle: The cavity size was normal. Wall thickness was  normal. Systolic function was normal. - Right atrium: The atrium was severely dilated. - Tricuspid valve: There was trivial regurgitation.     ASSESSMENT:    1. Coronary artery disease involving coronary bypass graft of native heart without angina pectoris   2. Severe aortic stenosis   3. Preoperative cardiovascular examination   4.  Chronic combined systolic and diastolic heart failure   5. Essential hypertension      PLAN:  In order of problems listed  above:  1. Patient is stable from the standpoint of angina pectoris. 2. Severe stenosis is present but only mildly to moderately asymptomatic. Plan left and right heart catheterization with coronary angiography after the left arm surgical procedure has been performed. 3. Will be at increased risk for upcoming orthopedic surgery, but I feel that the patient should be able to tolerate the procedure without much difficulty from the standpoint of the hear He is therefore cleared to proceed with left wrist surgery. I feel that before any further cardiac evaluation or treatment is done, he should have his arm fixed if surgery is needed. We will be in contact with Milly Jakob, M.D. [phone] fax [phone].    Medication Adjustments/Labs and Tests Ordered: Current medicines are reviewed at length with the patient today.  Concerns regarding medicines are outlined above.  Medication changes, Labs and Tests ordered today are listed in the Patient Instructions below. There are no Patient Instructions on file for this visit.   Signed, [name], MD  02/20/2016 12:36 PM    [address], [place], [place]  [address] Phone: [phone]; Fax: [phone]

## 2016-02-22 DIAGNOSIS — S52531D Colles' fracture of right radius, subsequent encounter for closed fracture with routine healing: Secondary | ICD-10-CM | POA: Diagnosis not present

## 2016-02-23 ENCOUNTER — Encounter (HOSPITAL_COMMUNITY): Payer: Self-pay | Admitting: *Deleted

## 2016-02-23 ENCOUNTER — Telehealth: Payer: Self-pay | Admitting: Interventional Cardiology

## 2016-02-23 ENCOUNTER — Other Ambulatory Visit: Payer: Self-pay | Admitting: Orthopedic Surgery

## 2016-02-23 MED ORDER — LACTATED RINGERS IV SOLN: INTRAVENOUS | Status: DC

## 2016-02-23 MED ORDER — CEFAZOLIN SODIUM-DEXTROSE 2-4 GM/100ML-% IV SOLN
2.0000 g | INTRAVENOUS | Status: AC
  Administered 2016-02-24: 2 g via INTRAVENOUS

## 2016-02-23 MED ORDER — LACTATED RINGERS IV SOLN
INTRAVENOUS | Status: DC
  Administered 2016-02-24: 07:00:00 via INTRAVENOUS

## 2016-02-23 NOTE — Progress Notes (Signed)
Anesthesia Chart Review: SAME DAY WORK-UP.  Patient is a 82 year old male scheduled for open treatment of left distal radius fracture on 02/24/16 by Dr. Milly Jakob. He sustained the left radial fracture after tripping while gardening on 02/04/16 Procedure is posted for general anesthesia with pre-op block.  History includes former smoker, CAD s/p CABG (LIMA-LAD, SVG-DAIG, SVG-RCA, SVG-OM) '93 with NSTEMI '05 (SVG-DAIG occlusion) and s/p BMS ostium SVG-RCA 10/20/10 and BMS to ostium SVG-OM 04/21/14, chronic combined systolic and diastolic CHF, moderate to severe AS, right BBB, HTN, HLD, hypothyroidism, BPH, non-Hodgkin's lymphoma. Of note, patient was initially scheduled for LHC with possible PCI on 02/14/16 for further evaluation of exertional dyspnea and progressive exertional tolerance. However, after his fall and wrist fracture, Dr. Tamala Julian put the planned catheterization on hold because it was not precipitated by a sudden changes in his overall clinical cardiovascular condition. In his/02/20/16 note Dr. Tamala Julian writes: "1. Patient is stable from the standpoint of angina pectoris.  2. Severe stenosis is present but only mildly to moderately asymptomatic. Plan left and right heart catheterization with coronary angiography after the left arm surgical procedure has been performed."  3. Will be at increased risk for upcoming orthopedic surgery, but I feel that the patient should be able to tolerate the procedure without much difficulty from the standpoint of the hear He is therefore cleared to proceed with left wrist surgery. I feel that before any further cardiac evaluation or treatment is done, he should have his arm fixed if surgery is needed."  PCP is Dr. [name].  Meds include ASA 81mg , Lipitor, Proscar, folic acid, HCTZ, Norco, Imdur, levothyroxine, magnesium, Lopressor, Nitro, potassium, Flomax.  07/12/15 EKG: Incomplete right bundle, sinus bradycardia, first-degree AV block and an old  inferior infarct.  07/07/15 Echocardiogram: Study Conclusions - Left ventricle: Global hypokinesis worse in the inferolateral and  inferior walls. The cavity size was normal. There was mild  concentric hypertrophy. Systolic function was mildly reduced. The  estimated ejection fraction was in the range of 45% to 50%.  Doppler parameters are consistent with abnormal left ventricular  relaxation (grade 1 diastolic dysfunction). - Aortic valve: Trileaflet; normal thickness, moderately calcified  leaflets. There was moderate stenosis. There was mild  regurgitation. Peak velocity (S): 392 cm/s. Mean gradient (S): 33  mm Hg. Valve area (VTI): 1.28 cm^2. Valve area (Vmax): 1.14 cm^2.  Valve area (Vmean): 1.03 cm^2.  - Mitral valve: Calcified annulus. There was trivial regurgitation. - Left atrium: The atrium was severely dilated. - Right ventricle: The cavity size was normal. Wall thickness was  normal. Systolic function was normal. - Right atrium: The atrium was severely dilated. - Tricuspid valve: There was trivial regurgitation.  04/21/14 LHC/PCI: ANGIOGRAPHIC RESULTS: 95% ostial/proximal SVG to the obtuse marginal reduced to less than 20% with TIMI grade 3 flow after BM stenting using distal protection with the Spider FX device. TIMI grade 3 flow was noted. No immediate complications were noted. IMPRESSIONS:  1. Successful bare-metal stent in the ostial/proximal SVG to the circumflex reducing a 95% stenosis to less than 25% with TIMI grade 3 flow 2. Stable 50-70% ostial stenosis of the saphenous vein graft to the distal right coronary. The previously placed stent extends from the graft ostium by at least 5-6 mm. With the lesion being stable over 16-18 months, intervention was not performed and this lesion can be treated medically.  He will need labs on arrival.   I updated anesthesiologist Dr. Lissa Hoard about patient's history and potential  plan for LHC to evaluate for  progressive CAD and AS after he recovers from this surgery. Further evaluation by his anesthesiologist on the day of surgery to ensure no acute CV/CHF symptoms prior to proceeding. With his significant cardiac history and co-morbidities may prefer to attempt procedure under regional anesthesia (nerve block). Definitive anesthesia plan following evaluation tomorrow.  [facility] Short Stay Center/Anesthesiology Phone [phone] 02/23/2016 5:29 PM

## 2016-02-23 NOTE — H&P (Signed)
Tim Campbell is an 82 y.o. male.   CC / Reason for Visit: Left wrist fracture HPI: This patient returns to clinic today with his wife and daughter indicating that he went to see his cardiologist who felt that he should have his distal radius fracture repaired first prior to catheterization and subsequent stent placement because of having to be on anticoagulants.  He would like to proceed with surgical repair of his left distal radius fracture.   HPI 02/15/2016:This patient is an 82 year old, right-hand-dominant, retired male who was seeing Dr. Mayer Camel for a left displaced intra-articular distal radius fracture that he sustained on 02/04/2016 following a [place]. He also had fracture blisters which according to Dr. Mayer Camel note, is healing.  The patient does have heart disease which includes open heart surgery, bypass surgery, and stents.  His cardiologist is Dr. Tamala Campbell and he was originally scheduled for catheterization last Tuesday for new evaluation of his heart, but then sustained this injury.  He is referred to [place] today by Dr. Mayer Camel for further evaluation and potential surgical treatment.  He presents in a sugar tong splint.  Past Medical History  Diagnosis Date  . Coronary artery disease     with occluded SVG to diagonal and BMS to prox SVG of RCA 10/2010. Recth 12/2012, 70% ISR SVG to RCA, 50% SVG to Cfx, 100% SVG to diagonal, and Patent LIMA with diffuse LAD dz.  . Hypertension   . Non-ST elevation MI (NSTEMI) ([place])   . Hyperlipidemia   . Insomnia   . Vitamin D deficiency   . Aortic stenosis     severe by echo September 2014  . Hypothyroidism   . RBBB   . Diverticulosis   . Non Hodgkin's lymphoma ([place])   . Enlarged prostate     per patient  with mild effect on urine stream  . Arthritis     osteo knees "   . Chronic combined systolic and diastolic HF (heart failure), NYHA class 3 09/09/2013    LVEF 45%.     Past Surgical History  Procedure Laterality Date  . Ankle surgery    . Coronary  artery bypass graft  1993  . Hemorrhoid surgery    . Right inguinal hernia repair    . Coronary stent placement  04/21/2014    bare metal SVG   [facility]  . Knee arthroscopy Right 1970'S   . Left heart catheterization with coronary/graft angiogram N/A 01/02/2013    Procedure: LEFT HEART CATHETERIZATION WITH [name];  Surgeon: [name], MD;  Location: [facility] CATH LAB;  Service: Cardiovascular;  Laterality: N/A;  . Left and right heart catheterization with coronary/graft angiogram N/A 04/05/2014    Procedure: LEFT AND RIGHT HEART CATHETERIZATION WITH [name];  Surgeon: Burnell Blanks, MD;  Location[facility] CATH LAB;  Service: Cardiovascular;  Laterality: N/A;  . Percutaneous coronary stent intervention (pci-s) N/A 04/21/2014    Procedure: PERCUTANEOUS CORONARY STENT INTERVENTION (PCI-S);  Surgeon: [name], MD;  Location: [facility] CATH LAB;  Service: Cardiovascular;  Laterality: N/A;    Family History  Problem Relation Age of Onset  . Colon cancer Father   . Stroke Mother   . CAD Brother   . Stroke Sister   . Bone cancer Brother   . Parkinson's disease Brother   . Dementia Sister   . Stroke Sister    Social History:  reports that he has quit smoking. His smoking use included Cigarettes. He has a 7.5  pack-year smoking history. He has never used smokeless tobacco. He reports that he does not drink alcohol or use illicit drugs.  Allergies: No Known Allergies  No prescriptions prior to admission    No results found for this or any previous visit (from the past 48 hour(s)). No results found.  Review of Systems  All other systems reviewed and are negative.   There were no vitals taken for this visit. Physical Exam  Constitutional:  WD, WN, NAD HEENT:  NCAT, EOMI Neuro/Psych:  Alert & oriented to person, place, and time; appropriate mood & affect Lymphatic: No generalized UE edema or lymphadenopathy Extremities / MSK:  Both UE are normal  with respect to appearance, ranges of motion, joint stability, muscle strength/tone, sensation, & perfusion except as otherwise noted:  The sugar tong splint is removed, modified and the skin is checked on the volar surface with a fracture blister was initially.  It is found to be healing with no signs of infection.  The patient has decent digital motion and is NVI.   Labs / Xrays:  In addition to the 3 views of the left wrist a inclined lateral was ordered and obtained today and demonstrates a, closed, impacted and comminuted, distal radius fracture with about 22 of dorsal tilt, flattening of the radial inclination secondary to asymmetrical impaction, 1+ centimeters dorsal translation, but reasonably good articular alignment.  Assessment: Left distal radius fracture  Plan:  The findings were discussed with both the patient and his wife and daughter.  He plans to proceed with left distal radius fracture repair.  This will have to be done in the main hospital secondary to his cardiac issues.  Once this has been arranged, our surgery coordinator will call him with the details.  Tentatively this will be scheduled for next week.  Questions were invited and answered.  In addition to the goal of open treatment should we decide to proceed in that direction, the risks of the procedure to include but not limited to bleeding; infection; damage to the nerves or blood vessels that could result in bleeding, numbness, weakness, chronic pain, and the need for additional procedures; stiffness; the need for revision surgery; and anesthetic risks were reviewed.    Rand Etchison A., MD 02/23/2016, 9:55 AM

## 2016-02-23 NOTE — Telephone Encounter (Signed)
New message      Request for surgical clearance:  1. What type of surgery is being performed? Wrist surgery left side  2. When is this surgery scheduled? Friday May 19 th  Are there any medications that need to be held prior to surgery and how long? No  3. Name of physician performing surgery? Dr. Micheline Rough  4. What is your office phone and fax number? Cell number [phone] uncertain of fax number   Daughter was wanting to know if Dr. Tamala Julian was going to call the orthopedic surgeon regarding the surgery, the daughter states dr. Tamala Julian took all the information on Monday from her. But the orthopedic doctor has not heard from Dr. Tamala Julian. 5.

## 2016-02-23 NOTE — Telephone Encounter (Signed)
Follow-up      The daughter called back with a new fax number-for Dr. Micheline Rough [phone]

## 2016-02-23 NOTE — Progress Notes (Signed)
Pt SDW-pre-op call completed by both pt and pt spouse Tim Campbell, with pt consent. Pt denies any acute cardiopulmonary issues. Pt is under the care of Dr. Daneen Schick, Cardiology. Pt spouse made aware that pt is to stop taking vitamins, fish oil, herbal medications such as Garlic, COQ 10, Chlorophyll, NAT-RUL PSYLLIUM SEED HUSKS.Marland Kitchen Anesthesia to review cardiac note in Epic. Pt spouse verbalized understanding of all pre-op instructions.

## 2016-02-23 NOTE — Telephone Encounter (Signed)
Paperwork faxed to Dr. Biagio Borg office

## 2016-02-23 NOTE — Telephone Encounter (Addendum)
Pt seen 5/15 by Dr. Tamala Julian.  Per his office note:  Will be at increased risk for upcoming orthopedic surgery, but I feel that the patient should be able to tolerate the procedure without much difficulty from the standpoint of the hear He is therefore cleared to proceed with left wrist surgery. I feel that before any further cardiac evaluation or treatment is done, he should have his arm fixed if surgery is needed. We will be in contact with Milly Jakob, M.D. [phone] fax [phone].  Will fax to Dr. Biagio Borg office.

## 2016-02-24 ENCOUNTER — Ambulatory Visit (HOSPITAL_COMMUNITY): Payer: Medicare Other | Admitting: Vascular Surgery

## 2016-02-24 ENCOUNTER — Ambulatory Visit (HOSPITAL_COMMUNITY): Payer: Medicare Other

## 2016-02-24 ENCOUNTER — Encounter (HOSPITAL_COMMUNITY)
Admission: RE | Disposition: A | Discharge status: Home / Self Care | Payer: Self-pay | Source: Ambulatory Visit | Attending: Orthopedic Surgery

## 2016-02-24 ENCOUNTER — Ambulatory Visit (HOSPITAL_COMMUNITY)
Admission: RE | Admit: 2016-02-24 | Discharge: 2016-02-24 | Disposition: A | Discharge status: Home / Self Care | Payer: Medicare Other | Source: Ambulatory Visit | Attending: Orthopedic Surgery | Admitting: Orthopedic Surgery

## 2016-02-24 ENCOUNTER — Encounter (HOSPITAL_COMMUNITY): Payer: Self-pay | Admitting: *Deleted

## 2016-02-24 DIAGNOSIS — I4510 Unspecified right bundle-branch block: Secondary | ICD-10-CM | POA: Insufficient documentation

## 2016-02-24 DIAGNOSIS — M1990 Unspecified osteoarthritis, unspecified site: Secondary | ICD-10-CM | POA: Diagnosis not present

## 2016-02-24 DIAGNOSIS — S52592A Other fractures of lower end of left radius, initial encounter for closed fracture: Secondary | ICD-10-CM | POA: Diagnosis not present

## 2016-02-24 DIAGNOSIS — I2510 Atherosclerotic heart disease of native coronary artery without angina pectoris: Secondary | ICD-10-CM | POA: Insufficient documentation

## 2016-02-24 DIAGNOSIS — S52572A Other intraarticular fracture of lower end of left radius, initial encounter for closed fracture: Secondary | ICD-10-CM | POA: Diagnosis not present

## 2016-02-24 DIAGNOSIS — Z955 Presence of coronary angioplasty implant and graft: Secondary | ICD-10-CM | POA: Diagnosis not present

## 2016-02-24 DIAGNOSIS — Z820 Family history of epilepsy and other diseases of the nervous system: Secondary | ICD-10-CM | POA: Insufficient documentation

## 2016-02-24 DIAGNOSIS — I110 Hypertensive heart disease with heart failure: Secondary | ICD-10-CM | POA: Diagnosis not present

## 2016-02-24 DIAGNOSIS — W19XXXA Unspecified fall, initial encounter: Secondary | ICD-10-CM | POA: Diagnosis not present

## 2016-02-24 DIAGNOSIS — N401 Enlarged prostate with lower urinary tract symptoms: Secondary | ICD-10-CM | POA: Insufficient documentation

## 2016-02-24 DIAGNOSIS — Y939 Activity, unspecified: Secondary | ICD-10-CM | POA: Diagnosis not present

## 2016-02-24 DIAGNOSIS — Z8249 Family history of ischemic heart disease and other diseases of the circulatory system: Secondary | ICD-10-CM | POA: Diagnosis not present

## 2016-02-24 DIAGNOSIS — Z808 Family history of malignant neoplasm of other organs or systems: Secondary | ICD-10-CM | POA: Diagnosis not present

## 2016-02-24 DIAGNOSIS — I252 Old myocardial infarction: Secondary | ICD-10-CM | POA: Diagnosis not present

## 2016-02-24 DIAGNOSIS — S52531A Colles' fracture of right radius, initial encounter for closed fracture: Secondary | ICD-10-CM | POA: Diagnosis not present

## 2016-02-24 DIAGNOSIS — I083 Combined rheumatic disorders of mitral, aortic and tricuspid valves: Secondary | ICD-10-CM | POA: Insufficient documentation

## 2016-02-24 DIAGNOSIS — E039 Hypothyroidism, unspecified: Secondary | ICD-10-CM | POA: Diagnosis not present

## 2016-02-24 DIAGNOSIS — Z8572 Personal history of non-Hodgkin lymphomas: Secondary | ICD-10-CM | POA: Diagnosis not present

## 2016-02-24 DIAGNOSIS — Z87891 Personal history of nicotine dependence: Secondary | ICD-10-CM | POA: Insufficient documentation

## 2016-02-24 DIAGNOSIS — E559 Vitamin D deficiency, unspecified: Secondary | ICD-10-CM | POA: Insufficient documentation

## 2016-02-24 DIAGNOSIS — K5790 Diverticulosis of intestine, part unspecified, without perforation or abscess without bleeding: Secondary | ICD-10-CM | POA: Diagnosis not present

## 2016-02-24 DIAGNOSIS — I5040 Unspecified combined systolic (congestive) and diastolic (congestive) heart failure: Secondary | ICD-10-CM | POA: Diagnosis not present

## 2016-02-24 DIAGNOSIS — I5042 Chronic combined systolic (congestive) and diastolic (congestive) heart failure: Secondary | ICD-10-CM | POA: Diagnosis not present

## 2016-02-24 DIAGNOSIS — Z951 Presence of aortocoronary bypass graft: Secondary | ICD-10-CM | POA: Insufficient documentation

## 2016-02-24 DIAGNOSIS — E785 Hyperlipidemia, unspecified: Secondary | ICD-10-CM | POA: Diagnosis not present

## 2016-02-24 DIAGNOSIS — Z800 Family history of malignant neoplasm of digestive organs: Secondary | ICD-10-CM | POA: Diagnosis not present

## 2016-02-24 DIAGNOSIS — S52502A Unspecified fracture of the lower end of left radius, initial encounter for closed fracture: Secondary | ICD-10-CM | POA: Diagnosis present

## 2016-02-24 DIAGNOSIS — R3912 Poor urinary stream: Secondary | ICD-10-CM | POA: Diagnosis not present

## 2016-02-24 DIAGNOSIS — Z419 Encounter for procedure for purposes other than remedying health state, unspecified: Secondary | ICD-10-CM

## 2016-02-24 DIAGNOSIS — G4700 Insomnia, unspecified: Secondary | ICD-10-CM | POA: Insufficient documentation

## 2016-02-24 DIAGNOSIS — M170 Bilateral primary osteoarthritis of knee: Secondary | ICD-10-CM | POA: Insufficient documentation

## 2016-02-24 DIAGNOSIS — R001 Bradycardia, unspecified: Secondary | ICD-10-CM | POA: Diagnosis not present

## 2016-02-24 DIAGNOSIS — I350 Nonrheumatic aortic (valve) stenosis: Secondary | ICD-10-CM | POA: Insufficient documentation

## 2016-02-24 DIAGNOSIS — Z823 Family history of stroke: Secondary | ICD-10-CM | POA: Diagnosis not present

## 2016-02-24 HISTORY — PX: OPEN REDUCTION INTERNAL FIXATION (ORIF) DISTAL RADIAL FRACTURE: SHX5989

## 2016-02-24 LAB — CBC
HCT: 38.6 % — ABNORMAL LOW (ref 39.0–52.0)
HEMOGLOBIN: 11.9 g/dL — AB (ref 13.0–17.0)
MCH: 26.9 pg (ref 26.0–34.0)
MCHC: 30.8 g/dL (ref 30.0–36.0)
MCV: 87.1 fL (ref 78.0–100.0)
PLATELETS: 159 10*3/uL (ref 150–400)
RBC: 4.43 MIL/uL (ref 4.22–5.81)
RDW: 14.9 % (ref 11.5–15.5)
WBC: 5.9 10*3/uL (ref 4.0–10.5)

## 2016-02-24 LAB — BASIC METABOLIC PANEL
Anion gap: 9 (ref 5–15)
BUN: 14 mg/dL (ref 6–20)
CHLORIDE: 105 mmol/L (ref 101–111)
CO2: 27 mmol/L (ref 22–32)
CREATININE: 0.85 mg/dL (ref 0.61–1.24)
Calcium: 8.7 mg/dL — ABNORMAL LOW (ref 8.9–10.3)
GFR calc Af Amer: >60 mL/min (ref >60)
GFR calc non Af Amer: >60 mL/min (ref >60)
GLUCOSE: 109 mg/dL — AB (ref 65–99)
POTASSIUM: 3.4 mmol/L — AB (ref 3.5–5.1)
SODIUM: 141 mmol/L (ref 135–145)

## 2016-02-24 SURGERY — OPEN REDUCTION INTERNAL FIXATION (ORIF) DISTAL RADIUS FRACTURE
Anesthesia: Monitor Anesthesia Care | Laterality: Left

## 2016-02-24 MED ORDER — HYDROCODONE-ACETAMINOPHEN 5-325 MG PO TABS: 1.0000 | ORAL_TABLET | ORAL | Status: DC | PRN

## 2016-02-24 MED ORDER — ONDANSETRON HCL 4 MG/2ML IJ SOLN
INTRAMUSCULAR | Status: AC
  Filled 2016-02-24: qty 2

## 2016-02-24 MED ORDER — LIDOCAINE HCL (PF) 1 % IJ SOLN
INTRAMUSCULAR | Status: AC
  Filled 2016-02-24: qty 30

## 2016-02-24 MED ORDER — METOCLOPRAMIDE HCL 5 MG/ML IJ SOLN: 10.0000 mg | Freq: Once | INTRAMUSCULAR | Status: DC | PRN

## 2016-02-24 MED ORDER — FENTANYL CITRATE (PF) 100 MCG/2ML IJ SOLN
25.0000 ug | INTRAMUSCULAR | Status: DC | PRN
  Administered 2016-02-24 (×3): 25 ug via INTRAVENOUS

## 2016-02-24 MED ORDER — PROPOFOL 10 MG/ML IV BOLUS
INTRAVENOUS | Status: AC
  Filled 2016-02-24: qty 20

## 2016-02-24 MED ORDER — FENTANYL CITRATE (PF) 100 MCG/2ML IJ SOLN
INTRAMUSCULAR | Status: AC
  Administered 2016-02-24: 25 ug via INTRAVENOUS
  Filled 2016-02-24: qty 2

## 2016-02-24 MED ORDER — BUPIVACAINE HCL (PF) 0.25 % IJ SOLN
INTRAMUSCULAR | Status: AC
  Filled 2016-02-24: qty 30

## 2016-02-24 MED ORDER — FENTANYL CITRATE (PF) 100 MCG/2ML IJ SOLN
INTRAMUSCULAR | Status: DC | PRN
  Administered 2016-02-24: 50 ug via INTRAVENOUS
  Administered 2016-02-24: 25 ug via INTRAVENOUS
  Administered 2016-02-24: 50 ug via INTRAVENOUS

## 2016-02-24 MED ORDER — ROCURONIUM BROMIDE 50 MG/5ML IV SOLN
INTRAVENOUS | Status: AC
  Filled 2016-02-24: qty 1

## 2016-02-24 MED ORDER — PROPOFOL 500 MG/50ML IV EMUL
INTRAVENOUS | Status: DC | PRN
  Administered 2016-02-24: 25 ug/kg/min via INTRAVENOUS

## 2016-02-24 MED ORDER — LIDOCAINE 2% (20 MG/ML) 5 ML SYRINGE
INTRAMUSCULAR | Status: AC
  Filled 2016-02-24: qty 5

## 2016-02-24 MED ORDER — BUPIVACAINE-EPINEPHRINE (PF) 0.5% -1:200000 IJ SOLN
INTRAMUSCULAR | Status: DC | PRN
  Administered 2016-02-24: 30 mL via PERINEURAL

## 2016-02-24 MED ORDER — ARTIFICIAL TEARS OP OINT
TOPICAL_OINTMENT | OPHTHALMIC | Status: AC
  Filled 2016-02-24: qty 3.5

## 2016-02-24 MED ORDER — PHENYLEPHRINE HCL 10 MG/ML IJ SOLN
INTRAMUSCULAR | Status: DC | PRN
  Administered 2016-02-24: 120 ug via INTRAVENOUS

## 2016-02-24 MED ORDER — MIDAZOLAM HCL 2 MG/2ML IJ SOLN
INTRAMUSCULAR | Status: AC
  Filled 2016-02-24: qty 2

## 2016-02-24 MED ORDER — SUCCINYLCHOLINE CHLORIDE 20 MG/ML IJ SOLN
INTRAMUSCULAR | Status: AC
  Filled 2016-02-24: qty 1

## 2016-02-24 MED ORDER — MEPERIDINE HCL 25 MG/ML IJ SOLN: 6.2500 mg | INTRAMUSCULAR | Status: DC | PRN

## 2016-02-24 MED ORDER — SODIUM CHLORIDE 0.9 % IJ SOLN
INTRAMUSCULAR | Status: AC
  Filled 2016-02-24: qty 10

## 2016-02-24 MED ORDER — EPHEDRINE SULFATE 50 MG/ML IJ SOLN
INTRAMUSCULAR | Status: AC
  Filled 2016-02-24: qty 1

## 2016-02-24 MED ORDER — CEFAZOLIN SODIUM-DEXTROSE 2-4 GM/100ML-% IV SOLN
INTRAVENOUS | Status: AC
  Filled 2016-02-24: qty 100

## 2016-02-24 MED ORDER — 0.9 % SODIUM CHLORIDE (POUR BTL) OPTIME
TOPICAL | Status: DC | PRN
  Administered 2016-02-24: 1000 mL

## 2016-02-24 MED ORDER — DEXAMETHASONE SODIUM PHOSPHATE 10 MG/ML IJ SOLN
INTRAMUSCULAR | Status: AC
  Filled 2016-02-24: qty 1

## 2016-02-24 MED ORDER — MIDAZOLAM HCL 5 MG/5ML IJ SOLN
INTRAMUSCULAR | Status: DC | PRN
  Administered 2016-02-24 (×2): 0.5 mg via INTRAVENOUS

## 2016-02-24 MED ORDER — FENTANYL CITRATE (PF) 250 MCG/5ML IJ SOLN
INTRAMUSCULAR | Status: AC
  Filled 2016-02-24: qty 5

## 2016-02-24 MED ORDER — LIDOCAINE HCL (CARDIAC) 20 MG/ML IV SOLN
INTRAVENOUS | Status: AC
  Filled 2016-02-24: qty 5

## 2016-02-24 SURGICAL SUPPLY — 62 items
BANDAGE COBAN STERILE 2 (GAUZE/BANDAGES/DRESSINGS) IMPLANT
BIT DRILL 2 FAST STEP (BIT) ×3 IMPLANT
BIT DRILL 2.5X4 QC (BIT) ×3 IMPLANT
BLADE SURG 15 STRL LF DISP TIS (BLADE) ×1 IMPLANT
BLADE SURG 15 STRL SS (BLADE) ×2
BNDG COHESIVE 4X5 TAN STRL (GAUZE/BANDAGES/DRESSINGS) ×3 IMPLANT
BNDG COHESIVE 6X5 TAN STRL LF (GAUZE/BANDAGES/DRESSINGS) ×3 IMPLANT
BNDG ESMARK 4X9 LF (GAUZE/BANDAGES/DRESSINGS) ×3 IMPLANT
BNDG GAUZE ELAST 4 BULKY (GAUZE/BANDAGES/DRESSINGS) ×6 IMPLANT
BONE CANC CHIPS 20CC PCAN1/4 (Bone Implant) ×3 IMPLANT
BRUSH SCRUB EZ PLAIN DRY (MISCELLANEOUS) ×3 IMPLANT
CANISTER SUCTION 2500CC (MISCELLANEOUS) ×3 IMPLANT
CHIPS CANC BONE 20CC PCAN1/4 (Bone Implant) ×1 IMPLANT
CHLORAPREP W/TINT 26ML (MISCELLANEOUS) ×3 IMPLANT
CORDS BIPOLAR (ELECTRODE) ×3 IMPLANT
COVER SURGICAL LIGHT HANDLE (MISCELLANEOUS) ×3 IMPLANT
COVER TABLE BACK 60X90 (DRAPES) ×3 IMPLANT
CUFF TOURNIQUET SINGLE 18IN (TOURNIQUET CUFF) ×3 IMPLANT
CUFF TOURNIQUET SINGLE 24IN (TOURNIQUET CUFF) IMPLANT
DRAPE C-ARM 42X72 X-RAY (DRAPES) ×3 IMPLANT
DRAPE SURG 17X23 STRL (DRAPES) ×3 IMPLANT
DRIVER PEG 2.0 FAST (Orthopedic Implant) ×6 IMPLANT
DRSG ADAPTIC 3X8 NADH LF (GAUZE/BANDAGES/DRESSINGS) ×3 IMPLANT
DRSG EMULSION OIL 3X3 NADH (GAUZE/BANDAGES/DRESSINGS) IMPLANT
GAUZE SPONGE 4X4 12PLY STRL (GAUZE/BANDAGES/DRESSINGS) ×3 IMPLANT
GLOVE BIO SURGEON STRL SZ7.5 (GLOVE) ×3 IMPLANT
GLOVE BIOGEL PI IND STRL 8 (GLOVE) ×1 IMPLANT
GLOVE BIOGEL PI INDICATOR 8 (GLOVE) ×2
GOWN STRL REUS W/ TWL XL LVL3 (GOWN DISPOSABLE) ×1 IMPLANT
GOWN STRL REUS W/TWL XL LVL3 (GOWN DISPOSABLE) ×2
K-WIRE 1.6 (WIRE) ×8
K-WIRE FX5X1.6XNS BN SS (WIRE) ×4
KIT BASIN OR (CUSTOM PROCEDURE TRAY) ×3 IMPLANT
KWIRE FX5X1.6XNS BN SS (WIRE) ×4 IMPLANT
NEEDLE HYPO 22GX1.5 SAFETY (NEEDLE) ×3 IMPLANT
NEEDLE HYPO 25X1 1.5 SAFETY (NEEDLE) IMPLANT
NS IRRIG 1000ML POUR BTL (IV SOLUTION) ×3 IMPLANT
PACK ORTHO EXTREMITY (CUSTOM PROCEDURE TRAY) ×3 IMPLANT
PAD CAST 4YDX4 CTTN HI CHSV (CAST SUPPLIES) ×1 IMPLANT
PADDING CAST ABS 4INX4YD NS (CAST SUPPLIES)
PADDING CAST ABS 6INX4YD NS (CAST SUPPLIES) ×2
PADDING CAST ABS COTTON 4X4 ST (CAST SUPPLIES) IMPLANT
PADDING CAST ABS COTTON 6X4 NS (CAST SUPPLIES) ×1 IMPLANT
PADDING CAST COTTON 4X4 STRL (CAST SUPPLIES) ×2
PEG SUBCHONDRAL SMOOTH 2.0X20 (Peg) ×3 IMPLANT
PEG SUBCHONDRAL SMOOTH 2.0X22 (Peg) ×3 IMPLANT
PEG SUBCHONDRAL SMOOTH 2.0X26 (Peg) ×18 IMPLANT
PENCIL BUTTON HOLSTER BLD 10FT (ELECTRODE) ×3 IMPLANT
PLATE SHORT 24.4X51.3 LT (Plate) ×3 IMPLANT
RUBBERBAND STERILE (MISCELLANEOUS) IMPLANT
SCREW CORT 3.5X14 LNG (Screw) ×6 IMPLANT
SCREW CORT 3.5X16 LNG (Screw) ×3 IMPLANT
SUCTION FRAZIER HANDLE 10FR (MISCELLANEOUS) ×2
SUCTION TUBE FRAZIER 10FR DISP (MISCELLANEOUS) ×1 IMPLANT
SUT VIC AB 2-0 CT3 27 (SUTURE) ×3 IMPLANT
SUT VICRYL 4-0 PS2 18IN ABS (SUTURE) IMPLANT
SUT VICRYL RAPIDE 4/0 PS 2 (SUTURE) ×3 IMPLANT
SYRINGE 10CC LL (SYRINGE) IMPLANT
TOWEL OR 17X24 6PK STRL BLUE (TOWEL DISPOSABLE) ×3 IMPLANT
TUBE CONNECTING 12'X1/4 (SUCTIONS) ×1
TUBE CONNECTING 12X1/4 (SUCTIONS) ×2 IMPLANT
UNDERPAD 30X30 INCONTINENT (UNDERPADS AND DIAPERS) ×3 IMPLANT

## 2016-02-24 NOTE — Anesthesia Postprocedure Evaluation (Signed)
Anesthesia Post Note  Patient: Tim Campbell  Procedure(s) Performed: Procedure(s) (LRB): OPEN TREATMENT OF LEFT DISTAL RADIUS FRACTURE (Left)  Patient location during evaluation: PACU Anesthesia Type: Regional and MAC Level of consciousness: awake and alert Pain management: pain level controlled Vital Signs Assessment: post-procedure vital signs reviewed and stable Respiratory status: spontaneous breathing, nonlabored ventilation and respiratory function stable Cardiovascular status: blood pressure returned to baseline and stable Postop Assessment: no signs of nausea or vomiting Anesthetic complications: no    Last Vitals:  Filed Vitals:   02/24/16 1020 02/24/16 1022  BP:  112/52  Pulse: 97 62  Temp: 36.3 C   Resp: 18 18    Last Pain:  Filed Vitals:   02/24/16 1029  PainSc: 5                  [name]

## 2016-02-24 NOTE — Addendum Note (Signed)
Addendum  created 02/24/16 1332 by Lowella Dell, CRNA   Modules edited: Anesthesia Flowsheet

## 2016-02-24 NOTE — Discharge Instructions (Signed)
Discharge Instructions ° ° °You have a dressing with a plaster splint incorporated in it. °Move your fingers as much as possible, making a full fist and fully opening the fist. °Elevate your hand to reduce pain & swelling of the digits.  Ice over the operative site may be helpful to reduce pain & swelling.  DO NOT USE HEAT. °Pain medicine has been prescribed for you.  °Use your medicine as needed over the first 48 hours, and then you can begin to taper your use.  You may use Tylenol in place of your prescribed pain medication, but not IN ADDITION to it. °Leave the dressing in place until you return to our office.  °You may shower, but keep the bandage clean & dry.  °You may drive a car when you are off of prescription pain medications and can safely control your vehicle with both hands. °Our office will call you to arrange follow-up ° ° °Please call [phone] during normal business hours or [phone] after hours for any problems. Including the following: ° °- excessive redness of the incisions °- drainage for more than 4 days °- fever of more than 101.5 F ° °*Please note that pain medications will not be refilled after hours or on weekends. ° °

## 2016-02-24 NOTE — Op Note (Signed)
02/24/2016  7:08 AM  PATIENT:  Tim Campbell  82 y.o. male  PRE-OPERATIVE DIAGNOSIS:  Displaced intra-articular left distal radius fracture  POST-OPERATIVE DIAGNOSIS:  Same  PROCEDURE:  ORIF left displaced intra-articular distal radius fracture, augmented with cancellous allograft.  SURGEON: Rayvon Char. Grandville Silos, MD  PHYSICIAN ASSISTANT: Morley Kos, OPA-C  ANESTHESIA:  regional and MAC  SPECIMENS:  None  DRAINS: None  EBL:  less than 50 mL  PREOPERATIVE INDICATIONS:  [name] is a  82 y.o. male with a displaced left intra-articular distal radius fracture, who also developed a large volar fracture blister, partially responsible for delaying definitive operative fixation  The risks benefits and alternatives were discussed with the patient preoperatively including but not limited to the risks of infection, bleeding, nerve injury, cardiopulmonary complications, the need for revision surgery, among others, and the patient verbalized understanding and consented to proceed.  OPERATIVE IMPLANTS: Biomet DVR standard short plate  OPERATIVE PROCEDURE: After receiving prophylactic antibiotics and a regional block, the patient was escorted to the operative theatre and placed in a supine position.   A surgical "time-out" was performed during which the planned procedure, proposed operative site, and the correct patient identity were compared to the operative consent and agreement confirmed by the circulating nurse according to current facility policy. Following application of a tourniquet to the operative extremity, the exposed skin was pre-scrubbed with a Hibiclens scrub brush and then was prepped with Chloraprep and draped in the usual sterile fashion. The limb was exsanguinated with an Esmarch bandage and the tourniquet inflated to approximately 159mmHg higher than systolic BP.   A sinusoidal-shaped incision was marked and made over the FCR axis and the distal forearm. The skin was  incised sharply with scalpel, subcutaneous tissues with blunt and spreading dissection. The FCR axis was exploited deeply. The pronator quadratus was reflected in an L-shaped ulnarly and the brachioradialis was split in a Z-plasty fashion for later reapproximation. The fracture was inspected and provisionally reduced.  This was confirmed fluoroscopically. The appropriately sized plate was selected and found to fit well. It was placed in its provisional alignment of the radius and this was confirmed fluoroscopically.  It was secured to the radius with a screw through the slotted hole.  Additional adjustments were made as necessary,the distal fragments were manipulated and aligned with respect to one another in the shaft and secured provisionally with K wires. All of the distal holes were all drilled and filled With smooth pegs.  Peg/screw length distally was selected on the shorter side of measurements to minimize the risk for dorsal cortical penetration. The remainder of the proximal holes were drilled and filled.   Final images were obtained and the DRUJ was examined for stability. It was found to be sufficiently stable, With good motion. The wound was then copiously irrigated and the brachioradialis repaired with 2-0 Vicryl Rapide suture followed by repair of the pronator quadratus with the same suture type. Tourniquet was released and additional hemostasis obtained and the skin was closed with 2-0 Vicryl deep dermal buried sutures followed by running 4-0 Vicryl Rapide horizontal mattress suture in the skin. A bulky dressing with a volar plaster component was applied and she was taken to room stable condition.  DISPOSITION: The patient will be discharged home today with typical post-op instructions, returning in 10-15 days for reevaluation with new x-rays of the affected wrist out of the splint to include an inclined lateral and then transition to therapy to have a custom splint  constructed and begin  rehabilitation.

## 2016-02-24 NOTE — Anesthesia Preprocedure Evaluation (Addendum)
Anesthesia Evaluation  Patient identified by MRN, date of birth, ID band Patient awake    Reviewed: Allergy & Precautions, NPO status , Patient's Chart, lab work & pertinent test results, reviewed documented beta blocker date and time   History of Anesthesia Complications Negative for: history of anesthetic complications  Airway Mallampati: I  TM Distance: >3 FB Neck ROM: Full    Dental  (+) Edentulous Lower, Edentulous Upper   Pulmonary former smoker,    Pulmonary exam normal        Cardiovascular Exercise Tolerance: Poor hypertension, Pt. on medications and Pt. on home beta blockers + CAD, + Past MI, + Cardiac Stents (2015) and + CABG (1993)  + dysrhythmias + Valvular Problems/Murmurs AS  Rhythm:Regular Rate:Bradycardia  ECHO 9/16: - Left ventricle: Global hypokinesis worse in the inferolateral and  inferior walls. The cavity size was normal. There was mild  concentric hypertrophy. Systolic function was mildly reduced. The  estimated ejection fraction was in the range of 45% to 50%.  Doppler parameters are consistent with abnormal left ventricular  relaxation (grade 1 diastolic dysfunction). - Aortic valve: Trileaflet; normal thickness, moderately calcified  leaflets. There was moderate stenosis. There was mild  regurgitation. Peak velocity (S): 392 cm/s. Mean gradient (S): 33  mm Hg. Valve area (VTI): 1.28 cm^2. Valve area (Vmax): 1.14 cm^2.  Valve area (Vmean): 1.03 cm^2. - Mitral valve: Calcified annulus. There was trivial regurgitation. - Left atrium: The atrium was severely dilated. - Right ventricle: The cavity size was normal. Wall thickness was  normal. Systolic function was normal. - Right atrium: The atrium was severely dilated. - Tricuspid valve: There was trivial regurgitation.   Neuro/Psych negative neurological ROS  negative psych ROS   GI/Hepatic negative GI ROS, Neg liver ROS,   Endo/Other   Hypothyroidism   Renal/GU negative Renal ROS  negative genitourinary   Musculoskeletal  (+) Arthritis ,   Abdominal   Peds negative pediatric ROS (+)  Hematology negative hematology ROS (+)   Anesthesia Other Findings   Reproductive/Obstetrics negative OB ROS                          Anesthesia Physical Anesthesia Plan  ASA: IV  Anesthesia Plan: MAC and Regional   Post-op Pain Management:    Induction:   Airway Management Planned: Nasal Cannula  Additional Equipment:   Intra-op Plan:   Post-operative Plan:   Informed Consent: I have reviewed the patients History and Physical, chart, labs and discussed the procedure including the risks, benefits and alternatives for the proposed anesthesia with the patient or authorized representative who has indicated his/her understanding and acceptance.   Dental advisory given  Plan Discussed with: CRNA  Anesthesia Plan Comments: (Supraclavicular block)        Anesthesia Quick Evaluation

## 2016-02-24 NOTE — Transfer of Care (Signed)
Immediate Anesthesia Transfer of Care Note  Patient: Tim Campbell  Procedure(s) Performed: Procedure(s): OPEN TREATMENT OF LEFT DISTAL RADIUS FRACTURE (Left)  Patient Location: PACU  Anesthesia Type:MAC  Level of Consciousness: awake, patient cooperative and lethargic  Airway & Oxygen Therapy: Patient Spontanous Breathing and Patient connected to face mask oxygen  Post-op Assessment: Report given to RN and Post -op Vital signs reviewed and stable  Post vital signs: Reviewed and stable  Last Vitals:  BP 106/56 HR 63 RR 18 SpO2 99% on 2L FM Resting comfortably, maintains good airway, denies pain.   Last Pain: There were no vitals filed for this visit.     Complications: No apparent anesthesia complications

## 2016-02-24 NOTE — Anesthesia Procedure Notes (Addendum)
Procedure Name: MAC Date/Time: 02/24/2016 7:35 AM Performed by: Lowella Dell Pre-anesthesia Checklist: Patient identified, Emergency Drugs available, Suction available, Patient being monitored and Timeout performed Patient Re-evaluated:Patient Re-evaluated prior to inductionOxygen Delivery Method: Simple face mask Intubation Type: IV induction Dental Injury: Teeth and Oropharynx as per pre-operative assessment    Anesthesia Regional Block:  Supraclavicular block  Pre-Anesthetic Checklist: ,, timeout performed, Correct Patient, Correct Site, Correct Laterality, Correct Procedure, Correct Position, site marked, Risks and benefits discussed,  Surgical consent,  Pre-op evaluation,  At surgeon's request and post-op pain management  Laterality: Left and Upper  Prep: Maximum Sterile Barrier Precautions used and chloraprep       Needles:  Injection technique: Single-shot  Needle Type: Echogenic Stimulator Needle     Needle Length: 10cm 10 cm Needle Gauge: 21 and 21 G    Additional Needles:  Procedures: ultrasound guided (picture in chart) Supraclavicular block Narrative:  Injection made incrementally with aspirations every 5 mL.  Performed by: Personally   Additional Notes: Risks, benefits and alternative to block explained extensively.  Patient tolerated procedure well, without complications.

## 2016-02-24 NOTE — Progress Notes (Signed)
Had just seen Dr. Tamala Julian on Monday for cardiac clearance per the wife

## 2016-02-24 NOTE — Interval H&P Note (Signed)
History and Physical Interval Note:  02/24/2016 7:07 AM  Tim Campbell  has presented today for surgery, with the diagnosis of LEFT DISTAL RADIUS FRACTURE S52.531  The various methods of treatment have been discussed with the patient and family. After consideration of risks, benefits and other options for treatment, the patient has consented to  Procedure(s): OPEN TREATMENT OF LEFT DISTAL RADIUS FRACTURE (Left) as a surgical intervention .  The patient's history has been reviewed, patient examined, no change in status, stable for surgery.  I have reviewed the patient's chart and labs.  Questions were answered to the patient's satisfaction.     [name] A.

## 2016-02-28 ENCOUNTER — Encounter (HOSPITAL_COMMUNITY): Payer: Self-pay | Admitting: Orthopedic Surgery

## 2016-03-02 ENCOUNTER — Other Ambulatory Visit: Payer: Self-pay | Admitting: Interventional Cardiology

## 2016-03-07 DIAGNOSIS — S52572A Other intraarticular fracture of lower end of left radius, initial encounter for closed fracture: Secondary | ICD-10-CM | POA: Diagnosis not present

## 2016-03-07 DIAGNOSIS — S52532D Colles' fracture of left radius, subsequent encounter for closed fracture with routine healing: Secondary | ICD-10-CM | POA: Diagnosis not present

## 2016-03-08 ENCOUNTER — Telehealth: Payer: Self-pay | Admitting: Interventional Cardiology

## 2016-03-08 NOTE — Telephone Encounter (Signed)
New message   Tim Campbell is calling for rn to return her call   Pt father was scheduled in May for a heart cath,   It was canceled because he fell and had a surgery on his left wrist      Pt c/o swelling: STAT is pt has developed SOB within 24 hours  1. How long have you been experiencing swelling? For a while 03-07-16 its gotten worse  2. Where is the swelling located? Both feet, left leg is worse 3.  Are you currently taking a "fluid pill"? yes  4.  Are you currently SOB? No, more than regular  5.  Have you traveled recently?no

## 2016-03-08 NOTE — Telephone Encounter (Signed)
Returned Tim Campbell daughter [name]'s call. She called to give an update on the Tim Campbell. Tim Campbell did have his wrist sx on 5/19. His cast was removed on 5/31. Tim Campbell had a open wound on his wrist. He was started on antibiotics (cephalexin, sulfamethoxazole). Tim Campbell has been sedentary lately. Tim Campbell sob is stable. Tim Campbell weight is stable. Tim Campbell has chronic LE swelling, swelling is currently worse than normal, the left leg is more swollen than the right. Tim Campbell isn't complainng of any discomfort, but they are concerned about the increased swelling. I asked he elevates his legs when sitting, [name] sts that he was told to but doesn't always do it, she is wondering if the Tim Campbell needs to be seen by Dr.Smith. Sonda Rumble that I will fwd and update to Dr.Smith and call back with his recommendation. She verbalized understanding.

## 2016-03-09 ENCOUNTER — Inpatient Hospital Stay (HOSPITAL_COMMUNITY): Admission: AD | Admit: 2016-03-09 | Payer: Self-pay | Source: Ambulatory Visit | Admitting: Orthopedic Surgery

## 2016-03-09 ENCOUNTER — Encounter (HOSPITAL_COMMUNITY)
Admission: RE | Disposition: A | Discharge status: Home / Self Care | Payer: Self-pay | Source: Ambulatory Visit | Attending: Orthopedic Surgery

## 2016-03-09 ENCOUNTER — Encounter (HOSPITAL_COMMUNITY): Payer: Self-pay | Admitting: General Practice

## 2016-03-09 ENCOUNTER — Inpatient Hospital Stay (HOSPITAL_COMMUNITY)
Admission: RE | Admit: 2016-03-09 | Discharge: 2016-03-12 | DRG: 857 | Disposition: A | Discharge status: Home / Self Care | Payer: Medicare Other | Source: Ambulatory Visit | Attending: Orthopedic Surgery | Admitting: Orthopedic Surgery

## 2016-03-09 ENCOUNTER — Inpatient Hospital Stay (HOSPITAL_COMMUNITY): Payer: Medicare Other | Admitting: Certified Registered Nurse Anesthetist

## 2016-03-09 DIAGNOSIS — Z961 Presence of intraocular lens: Secondary | ICD-10-CM | POA: Diagnosis present

## 2016-03-09 DIAGNOSIS — Z8572 Personal history of non-Hodgkin lymphomas: Secondary | ICD-10-CM

## 2016-03-09 DIAGNOSIS — Z808 Family history of malignant neoplasm of other organs or systems: Secondary | ICD-10-CM

## 2016-03-09 DIAGNOSIS — I2510 Atherosclerotic heart disease of native coronary artery without angina pectoris: Secondary | ICD-10-CM | POA: Diagnosis present

## 2016-03-09 DIAGNOSIS — E785 Hyperlipidemia, unspecified: Secondary | ICD-10-CM | POA: Diagnosis present

## 2016-03-09 DIAGNOSIS — E559 Vitamin D deficiency, unspecified: Secondary | ICD-10-CM | POA: Diagnosis present

## 2016-03-09 DIAGNOSIS — M7989 Other specified soft tissue disorders: Secondary | ICD-10-CM | POA: Diagnosis not present

## 2016-03-09 DIAGNOSIS — I5042 Chronic combined systolic (congestive) and diastolic (congestive) heart failure: Secondary | ICD-10-CM | POA: Diagnosis present

## 2016-03-09 DIAGNOSIS — I4510 Unspecified right bundle-branch block: Secondary | ICD-10-CM | POA: Diagnosis present

## 2016-03-09 DIAGNOSIS — E039 Hypothyroidism, unspecified: Secondary | ICD-10-CM | POA: Diagnosis present

## 2016-03-09 DIAGNOSIS — I252 Old myocardial infarction: Secondary | ICD-10-CM | POA: Diagnosis not present

## 2016-03-09 DIAGNOSIS — I350 Nonrheumatic aortic (valve) stenosis: Secondary | ICD-10-CM | POA: Diagnosis present

## 2016-03-09 DIAGNOSIS — Z8249 Family history of ischemic heart disease and other diseases of the circulatory system: Secondary | ICD-10-CM

## 2016-03-09 DIAGNOSIS — Z7982 Long term (current) use of aspirin: Secondary | ICD-10-CM | POA: Diagnosis not present

## 2016-03-09 DIAGNOSIS — Z800 Family history of malignant neoplasm of digestive organs: Secondary | ICD-10-CM | POA: Diagnosis not present

## 2016-03-09 DIAGNOSIS — G8918 Other acute postprocedural pain: Secondary | ICD-10-CM | POA: Diagnosis not present

## 2016-03-09 DIAGNOSIS — T8149XA Infection following a procedure, other surgical site, initial encounter: Secondary | ICD-10-CM | POA: Diagnosis present

## 2016-03-09 DIAGNOSIS — Z9842 Cataract extraction status, left eye: Secondary | ICD-10-CM | POA: Diagnosis not present

## 2016-03-09 DIAGNOSIS — Y838 Other surgical procedures as the cause of abnormal reaction of the patient, or of later complication, without mention of misadventure at the time of the procedure: Secondary | ICD-10-CM | POA: Diagnosis present

## 2016-03-09 DIAGNOSIS — Z951 Presence of aortocoronary bypass graft: Secondary | ICD-10-CM | POA: Diagnosis not present

## 2016-03-09 DIAGNOSIS — T814XXA Infection following a procedure, initial encounter: Secondary | ICD-10-CM | POA: Diagnosis not present

## 2016-03-09 DIAGNOSIS — Z820 Family history of epilepsy and other diseases of the nervous system: Secondary | ICD-10-CM

## 2016-03-09 DIAGNOSIS — Z9841 Cataract extraction status, right eye: Secondary | ICD-10-CM | POA: Diagnosis not present

## 2016-03-09 DIAGNOSIS — Z955 Presence of coronary angioplasty implant and graft: Secondary | ICD-10-CM

## 2016-03-09 DIAGNOSIS — Z87891 Personal history of nicotine dependence: Secondary | ICD-10-CM

## 2016-03-09 DIAGNOSIS — Z823 Family history of stroke: Secondary | ICD-10-CM | POA: Diagnosis not present

## 2016-03-09 DIAGNOSIS — I110 Hypertensive heart disease with heart failure: Secondary | ICD-10-CM | POA: Diagnosis not present

## 2016-03-09 HISTORY — PX: I & D EXTREMITY: SHX5045

## 2016-03-09 LAB — CBC WITH DIFFERENTIAL/PLATELET
BASOS ABS: 0 10*3/uL (ref 0.0–0.1)
Basophils Relative: 1 %
EOS PCT: 2 %
Eosinophils Absolute: 0.1 10*3/uL (ref 0.0–0.7)
HEMATOCRIT: 35.8 % — AB (ref 39.0–52.0)
Hemoglobin: 10.9 g/dL — ABNORMAL LOW (ref 13.0–17.0)
LYMPHS ABS: 1.9 10*3/uL (ref 0.7–4.0)
LYMPHS PCT: 31 %
MCH: 25.8 pg — AB (ref 26.0–34.0)
MCHC: 30.4 g/dL (ref 30.0–36.0)
MCV: 84.6 fL (ref 78.0–100.0)
MONO ABS: 0.3 10*3/uL (ref 0.1–1.0)
Monocytes Relative: 6 %
NEUTROS ABS: 3.7 10*3/uL (ref 1.7–7.7)
Neutrophils Relative %: 60 %
Platelets: 247 10*3/uL (ref 150–400)
RBC: 4.23 MIL/uL (ref 4.22–5.81)
RDW: 14.9 % (ref 11.5–15.5)
WBC: 6 10*3/uL (ref 4.0–10.5)

## 2016-03-09 LAB — BASIC METABOLIC PANEL
ANION GAP: 7 (ref 5–15)
BUN: 10 mg/dL (ref 6–20)
CO2: 30 mmol/L (ref 22–32)
Calcium: 8.9 mg/dL (ref 8.9–10.3)
Chloride: 102 mmol/L (ref 101–111)
Creatinine, Ser: 1.05 mg/dL (ref 0.61–1.24)
GFR calc Af Amer: >60 mL/min (ref >60)
GFR calc non Af Amer: >60 mL/min (ref >60)
GLUCOSE: 96 mg/dL (ref 65–99)
POTASSIUM: 3.7 mmol/L (ref 3.5–5.1)
Sodium: 139 mmol/L (ref 135–145)

## 2016-03-09 LAB — SURGICAL PCR SCREEN
MRSA, PCR: NEGATIVE
STAPHYLOCOCCUS AUREUS: NEGATIVE

## 2016-03-09 LAB — PROTIME-INR
INR: 1.2 (ref 0.00–1.49)
Prothrombin Time: 15.4 seconds — ABNORMAL HIGH (ref 11.6–15.2)

## 2016-03-09 SURGERY — IRRIGATION AND DEBRIDEMENT EXTREMITY
Anesthesia: General | Site: Wrist | Laterality: Left

## 2016-03-09 MED ORDER — LIDOCAINE 2% (20 MG/ML) 5 ML SYRINGE
INTRAMUSCULAR | Status: AC
  Filled 2016-03-09: qty 5

## 2016-03-09 MED ORDER — LACTATED RINGERS IV SOLN
INTRAVENOUS | Status: DC | PRN
  Administered 2016-03-09: 20:00:00 via INTRAVENOUS

## 2016-03-09 MED ORDER — VANCOMYCIN HCL IN DEXTROSE 750-5 MG/150ML-% IV SOLN
750.0000 mg | Freq: Two times a day (BID) | INTRAVENOUS | Status: DC
  Administered 2016-03-10 – 2016-03-11 (×4): 750 mg via INTRAVENOUS
  Filled 2016-03-09 (×5): qty 150

## 2016-03-09 MED ORDER — SUCCINYLCHOLINE CHLORIDE 200 MG/10ML IV SOSY
PREFILLED_SYRINGE | INTRAVENOUS | Status: AC
  Filled 2016-03-09: qty 10

## 2016-03-09 MED ORDER — ONDANSETRON HCL 4 MG/2ML IJ SOLN: 4.0000 mg | Freq: Four times a day (QID) | INTRAMUSCULAR | Status: DC | PRN

## 2016-03-09 MED ORDER — FENTANYL CITRATE (PF) 100 MCG/2ML IJ SOLN: 25.0000 ug | INTRAMUSCULAR | Status: DC | PRN

## 2016-03-09 MED ORDER — EPHEDRINE 5 MG/ML INJ
INTRAVENOUS | Status: AC
  Filled 2016-03-09: qty 30

## 2016-03-09 MED ORDER — PHENYLEPHRINE HCL 10 MG/ML IJ SOLN
INTRAMUSCULAR | Status: DC | PRN
  Administered 2016-03-09: 40 ug via INTRAVENOUS
  Administered 2016-03-09: 80 ug via INTRAVENOUS
  Administered 2016-03-09: 40 ug via INTRAVENOUS

## 2016-03-09 MED ORDER — LIDOCAINE-EPINEPHRINE 1 %-1:100000 IJ SOLN
INTRAMUSCULAR | Status: AC
  Filled 2016-03-09: qty 1

## 2016-03-09 MED ORDER — SODIUM CHLORIDE 0.9 % IV SOLN
1500.0000 mg | INTRAVENOUS | Status: DC | PRN
  Filled 2016-03-09: qty 1500

## 2016-03-09 MED ORDER — FENTANYL CITRATE (PF) 250 MCG/5ML IJ SOLN
INTRAMUSCULAR | Status: AC
  Filled 2016-03-09: qty 5

## 2016-03-09 MED ORDER — PIPERACILLIN-TAZOBACTAM 3.375 G IVPB
3.3750 g | Freq: Three times a day (TID) | INTRAVENOUS | Status: DC
  Administered 2016-03-09 – 2016-03-12 (×9): 3.375 g via INTRAVENOUS
  Filled 2016-03-09 (×10): qty 50

## 2016-03-09 MED ORDER — 0.9 % SODIUM CHLORIDE (POUR BTL) OPTIME
TOPICAL | Status: DC | PRN
  Administered 2016-03-09: 1000 mL

## 2016-03-09 MED ORDER — FENTANYL CITRATE (PF) 250 MCG/5ML IJ SOLN
INTRAMUSCULAR | Status: DC | PRN
  Administered 2016-03-09 (×2): 50 ug via INTRAVENOUS

## 2016-03-09 MED ORDER — OXYCODONE HCL 5 MG/5ML PO SOLN: 5.0000 mg | Freq: Once | ORAL | Status: DC | PRN

## 2016-03-09 MED ORDER — PHENYLEPHRINE 40 MCG/ML (10ML) SYRINGE FOR IV PUSH (FOR BLOOD PRESSURE SUPPORT)
PREFILLED_SYRINGE | INTRAVENOUS | Status: AC
  Filled 2016-03-09: qty 20

## 2016-03-09 MED ORDER — OXYCODONE HCL 5 MG PO TABS: 5.0000 mg | ORAL_TABLET | Freq: Once | ORAL | Status: DC | PRN

## 2016-03-09 MED ORDER — VANCOMYCIN HCL 10 G IV SOLR
1500.0000 mg | Freq: Once | INTRAVENOUS | Status: DC
  Filled 2016-03-09: qty 1500

## 2016-03-09 MED ORDER — PROPOFOL 10 MG/ML IV BOLUS
INTRAVENOUS | Status: DC | PRN
  Administered 2016-03-09: 120 mg via INTRAVENOUS

## 2016-03-09 MED ORDER — MIDAZOLAM HCL 2 MG/2ML IJ SOLN
INTRAMUSCULAR | Status: AC
  Filled 2016-03-09: qty 2

## 2016-03-09 MED ORDER — PROPOFOL 10 MG/ML IV BOLUS
INTRAVENOUS | Status: AC
  Filled 2016-03-09: qty 20

## 2016-03-09 MED ORDER — VANCOMYCIN HCL 1000 MG IV SOLR
1500.0000 mg | Freq: Once | INTRAVENOUS | Status: AC
  Administered 2016-03-09: 1500 mg via INTRAVENOUS
  Filled 2016-03-09: qty 1500

## 2016-03-09 MED ORDER — BUPIVACAINE-EPINEPHRINE (PF) 0.5% -1:200000 IJ SOLN
INTRAMUSCULAR | Status: DC | PRN
Start: 2016-03-09 — End: 2016-03-09
  Administered 2016-03-09: 30 mL via PERINEURAL

## 2016-03-09 MED ORDER — SODIUM CHLORIDE 0.9 % IR SOLN
Status: DC | PRN
  Administered 2016-03-09: 3000 mL

## 2016-03-09 MED ORDER — PHENYLEPHRINE HCL 10 MG/ML IJ SOLN
INTRAMUSCULAR | Status: AC
  Filled 2016-03-09: qty 1

## 2016-03-09 SURGICAL SUPPLY — 51 items
BNDG COHESIVE 4X5 TAN STRL (GAUZE/BANDAGES/DRESSINGS) ×3 IMPLANT
BNDG ESMARK 4X9 LF (GAUZE/BANDAGES/DRESSINGS) ×3 IMPLANT
BNDG GAUZE ELAST 4 BULKY (GAUZE/BANDAGES/DRESSINGS) ×3 IMPLANT
BRUSH SCRUB EZ PLAIN DRY (MISCELLANEOUS) ×3 IMPLANT
CHLORAPREP W/TINT 26ML (MISCELLANEOUS) ×3 IMPLANT
CORDS BIPOLAR (ELECTRODE) ×3 IMPLANT
COVER SURGICAL LIGHT HANDLE (MISCELLANEOUS) ×3 IMPLANT
CUFF TOURNIQUET SINGLE 18IN (TOURNIQUET CUFF) ×3 IMPLANT
DRAPE SURG 17X23 STRL (DRAPES) ×6 IMPLANT
DRSG ADAPTIC 3X8 NADH LF (GAUZE/BANDAGES/DRESSINGS) ×3 IMPLANT
ELECT REM PT RETURN 9FT ADLT (ELECTROSURGICAL) ×3
ELECTRODE REM PT RTRN 9FT ADLT (ELECTROSURGICAL) ×1 IMPLANT
EVACUATOR 1/8 PVC DRAIN (DRAIN) IMPLANT
GAUZE SPONGE 4X4 12PLY STRL (GAUZE/BANDAGES/DRESSINGS) ×3 IMPLANT
GLOVE BIO SURGEON STRL SZ 6.5 (GLOVE) ×4 IMPLANT
GLOVE BIO SURGEON STRL SZ7 (GLOVE) ×3 IMPLANT
GLOVE BIO SURGEON STRL SZ7.5 (GLOVE) ×3 IMPLANT
GLOVE BIO SURGEONS STRL SZ 6.5 (GLOVE) ×2
GLOVE BIOGEL PI IND STRL 7.0 (GLOVE) ×1 IMPLANT
GLOVE BIOGEL PI IND STRL 8 (GLOVE) ×1 IMPLANT
GLOVE BIOGEL PI INDICATOR 7.0 (GLOVE) ×2
GLOVE BIOGEL PI INDICATOR 8 (GLOVE) ×2
GLOVE SKINSENSE NS SZ6.5 (GLOVE) ×2
GLOVE SKINSENSE NS SZ7.0 (GLOVE) ×2
GLOVE SKINSENSE STRL SZ6.5 (GLOVE) ×1 IMPLANT
GLOVE SKINSENSE STRL SZ7.0 (GLOVE) ×1 IMPLANT
GOWN STRL REUS W/ TWL LRG LVL3 (GOWN DISPOSABLE) ×6 IMPLANT
GOWN STRL REUS W/TWL LRG LVL3 (GOWN DISPOSABLE) ×12
HANDPIECE INTERPULSE COAX TIP (DISPOSABLE)
KIT BASIN OR (CUSTOM PROCEDURE TRAY) ×3 IMPLANT
KIT ROOM TURNOVER OR (KITS) ×3 IMPLANT
MANIFOLD NEPTUNE II (INSTRUMENTS) ×3 IMPLANT
NS IRRIG 1000ML POUR BTL (IV SOLUTION) ×3 IMPLANT
PACK ORTHO EXTREMITY (CUSTOM PROCEDURE TRAY) ×3 IMPLANT
PAD ARMBOARD 7.5X6 YLW CONV (MISCELLANEOUS) ×6 IMPLANT
PAD CAST 4YDX4 CTTN HI CHSV (CAST SUPPLIES) ×1 IMPLANT
PADDING CAST COTTON 4X4 STRL (CAST SUPPLIES) ×2
SET CYSTO W/LG BORE CLAMP LF (SET/KITS/TRAYS/PACK) ×3 IMPLANT
SET HNDPC FAN SPRY TIP SCT (DISPOSABLE) IMPLANT
SPONGE LAP 18X18 X RAY DECT (DISPOSABLE) ×3 IMPLANT
SUT ETHILON 2 0 PSLX (SUTURE) ×3 IMPLANT
SWAB COLLECTION DEVICE MRSA (MISCELLANEOUS) ×3 IMPLANT
TOWEL OR 17X24 6PK STRL BLUE (TOWEL DISPOSABLE) ×3 IMPLANT
TOWEL OR 17X26 10 PK STRL BLUE (TOWEL DISPOSABLE) ×3 IMPLANT
TUBE ANAEROBIC SPECIMEN COL (MISCELLANEOUS) ×3 IMPLANT
TUBE CONNECTING 12'X1/4 (SUCTIONS) ×1
TUBE CONNECTING 12X1/4 (SUCTIONS) ×2 IMPLANT
TUBING CYSTO DISP (UROLOGICAL SUPPLIES) ×3 IMPLANT
UNDERPAD 30X30 INCONTINENT (UNDERPADS AND DIAPERS) ×3 IMPLANT
WATER STERILE IRR 1000ML POUR (IV SOLUTION) ×3 IMPLANT
YANKAUER SUCT BULB TIP NO VENT (SUCTIONS) ×3 IMPLANT

## 2016-03-09 NOTE — Consult Note (Signed)
Pharmacy Antibiotic Note  Tim Campbell is a 82 y.o. male s/p ORIF left distal radius on 02/24/16 is being admitted on 03/09/2016 with a wound infection. Pharmacy has been consulted for vancomycin and zosyn dosing. Renal function wnl.  Goal: vancomycin trough 10-15  Plan: 1) Vancomycin 1500mg  IV x 1 then750mg  IV q12 2) Zosyn 3.375g IV q8 (4 hour infusion) 3) Follow renal function, cultures, LOT, level if needed  Height: 5\' 10"  (177.8 cm) Weight: 222 lb 3.2 oz (100.789 kg) IBW/kg (Calculated) : 73  Temp (24hrs), Avg:98.2 F (36.8 C), Min:98.1 F (36.7 C), Max:98.2 F (36.8 C)  Estimated Creatinine Clearance: 72.8 mL/min (by C-G formula based on Cr of 0.85).    No Known Allergies  Antimicrobials this admission: 6/2 Vancomycin >> 6/2 Zosyn >>  Dose adjustments this admission: n/a  Microbiology results: None  Thank you for allowing pharmacy to be a part of this patient's care.  Deboraha Sprang 03/09/2016 3:19 PM

## 2016-03-09 NOTE — Anesthesia Preprocedure Evaluation (Signed)
Anesthesia Evaluation  Patient identified by MRN, date of birth, ID band Patient awake    Reviewed: Allergy & Precautions, H&P , NPO status , Patient's Chart, lab work & pertinent test results  Airway Mallampati: II   Neck ROM: full    Dental   Pulmonary former smoker,    breath sounds clear to auscultation       Cardiovascular hypertension, + CAD, + Past MI, + Cardiac Stents and + CABG  + Valvular Problems/Murmurs  Rhythm:regular Rate:Normal  TTE (06/2015): Moderate AS. AVA 1.2 cm2.  EF 45%   Neuro/Psych    GI/Hepatic   Endo/Other  Hypothyroidism obese  Renal/GU      Musculoskeletal  (+) Arthritis ,   Abdominal   Peds  Hematology   Anesthesia Other Findings   Reproductive/Obstetrics                             Anesthesia Physical Anesthesia Plan  ASA: III  Anesthesia Plan: General   Post-op Pain Management:    Induction: Intravenous  Airway Management Planned: LMA  Additional Equipment:   Intra-op Plan:   Post-operative Plan:   Informed Consent: I have reviewed the patients History and Physical, chart, labs and discussed the procedure including the risks, benefits and alternatives for the proposed anesthesia with the patient or authorized representative who has indicated his/her understanding and acceptance.     Plan Discussed with: CRNA, Anesthesiologist and Surgeon  Anesthesia Plan Comments:         Anesthesia Quick Evaluation

## 2016-03-09 NOTE — Transfer of Care (Signed)
Immediate Anesthesia Transfer of Care Note  Patient: Tim Campbell  Procedure(s) Performed: Procedure(s): IRRIGATION AND DEBRIDEMENT WRIST (Left)  Patient Location: PACU  Anesthesia Type:General  Level of Consciousness: awake, alert  and oriented  Airway & Oxygen Therapy: Patient connected to nasal cannula oxygen  Post-op Assessment: Report given to RN, Post -op Vital signs reviewed and stable and Patient moving all extremities X 4  Post vital signs: Reviewed and stable  Last Vitals:  Filed Vitals:   03/09/16 1300 03/09/16 1700  BP: 114/50 134/49  Pulse: 64 55  Temp: 36.7 C 36.9 C  Resp: 18 18    Last Pain:  Filed Vitals:   03/09/16 1757  PainSc: 0-No pain         Complications: No apparent anesthesia complications

## 2016-03-09 NOTE — Progress Notes (Signed)
Spoke with MD Grandville Silos- will add orders as soon as possible. Patient is resting at this time. IV started.

## 2016-03-09 NOTE — Op Note (Signed)
03/09/2016  8:18 PM  PATIENT:  Tim Campbell  82 y.o. male  PRE-OPERATIVE DIAGNOSIS:  Suspected deep infection of left distal forearm status post ORIF left distal radius  POST-OPERATIVE DIAGNOSIS:  L distal forearm soft-tissue infection  PROCEDURE:  Irrigation and excisional debridement of skin, subcutaneous tissues, and tenosynovium left volar forearm postoperative wound  SURGEON: Rayvon Char. Grandville Silos, MD  PHYSICIAN ASSISTANT: none  ANESTHESIA:  regional and general  SPECIMENS:  Cultures to microbiology  DRAINS:   None  EBL:  less than 50 mL  PREOPERATIVE INDICATIONS:  Tim Campbell is a  82 y.o. male with suspected deep infection of the left distal forearm status post ORIF of the left distal radius fracture approximately 2 weeks ago.  The risks benefits and alternatives were discussed with the patient preoperatively including but not limited to the risks of infection, bleeding, nerve injury, cardiopulmonary complications, the need for revision surgery, among others, and the patient verbalized understanding and consented to proceed.  OPERATIVE IMPLANTS: none  OPERATIVE PROCEDURE:  After receiving antibiotics on the floor and a regional block in the holding area, the patient was escorted to the operative theatre and placed in a supine position.  A surgical "time-out" was performed during which the planned procedure, proposed operative site, and the correct patient identity were compared to the operative consent and agreement confirmed by the circulating nurse according to current facility policy.  Following application of a tourniquet to the operative extremity, the exposed skin was prepped with Chloraprep and draped in the usual sterile fashion.  The limb was exsanguinated with gravity and the tourniquet inflated to approximately 166mmHg higher than systolic BP.  It became clear that he would need some additional anesthesia, so his anesthesia was deepened and an LMA was placed.  As  the wound was opened at the proximal and, there was some longitudinal collection of granulomatous type tissue with some seropurulent fluid around it that appeared to be superficial and subcutaneous plane.  Cultures were obtained.  There was no deep fluid collection that I could probe with a finger.  The skin and subcutaneous tissue edges were herbal edition quite friable and excisionally debrided back to more healthy skin.  He required scissor dissection to spread open beyond the fascial level to the deep plane.  Some Vicryl sutures were encountered and portions of the brachioradialis tendon were excised.  The tendon was a little soupy, but there was no gross fluid collection at this level.  There was some thickened tenosynovium around the deeper flexor tendons and this was excisionally debrided as well.  All of this level looked fairly healthy.  Nonetheless it was all copiously irrigated with running irrigant and scrubbed with a scrub brush and some CHG soap solution.  This was then rinsed copiously.  There appeared to be no detergent remnants.  Tourniquet was released, some additional marginal hemostasis obtained and then just the skin was closed with 2-0 nylon vertical mattress interrupted sutures.  A bulky dressing from the base of the digits to the proximal forearm was placed, placing the MP joints in flexion.  He was awakened and taken to the recovery room in stable condition, breathing spontaneously  DISPOSITION: Return to the floor for continued IV antibiotics awaiting culture results and further antibiotic therapy decision making.

## 2016-03-09 NOTE — H&P (Signed)
Tim Campbell is an 82 y.o. male.   Chief Complaint: left forearm postop infection HPI: This patient underwent ORIF of a left distal radius fracture approximately 2 weeks ago.  He was evaluated in the office on Wednesday, at which time he had some scant drainage and redness from an area adjacent to the incision.  He also had some inflammatory reaction around the absorbable sutures.  He was placed on oral antibiotics.  He has had continued drainage and developed some additional thin areas of dermis.  He was admitted for IV antibiotics and surgical incision/drainage/debridement as indicated.  Past Medical History  Diagnosis Date  . Coronary artery disease     with occluded SVG to diagonal and BMS to prox SVG of RCA 10/2010. Recth 12/2012, 70% ISR SVG to RCA, 50% SVG to Cfx, 100% SVG to diagonal, and Patent LIMA with diffuse LAD dz.  . Hypertension   . Non-ST elevation MI (NSTEMI) ([place])   . Hyperlipidemia   . Insomnia   . Vitamin D deficiency   . Aortic stenosis     severe by echo September 2014  . Hypothyroidism   . RBBB   . Diverticulosis   . Non Hodgkin's lymphoma ([place])   . Enlarged prostate     per patient  with mild effect on urine stream  . Arthritis     osteo knees "   . Chronic combined systolic and diastolic HF (heart failure), NYHA class 3 09/09/2013    LVEF 45%.   Marland Kitchen Heart murmur     Past Surgical History  Procedure Laterality Date  . Ankle surgery    . Coronary artery bypass graft  1993  . Hemorrhoid surgery    . Right inguinal hernia repair    . Coronary stent placement  04/21/2014    bare metal SVG   [facility]  . Knee arthroscopy Right 1970'S   . Left heart catheterization with coronary/graft angiogram N/A 01/02/2013    Procedure: LEFT HEART CATHETERIZATION WITH [name];  Surgeon: [name], MD;  Location: [facility] CATH LAB;  Service: Cardiovascular;  Laterality: N/A;  . Left and right heart catheterization with coronary/graft angiogram N/A 04/05/2014     Procedure: LEFT AND RIGHT HEART CATHETERIZATION WITH [name];  Surgeon: Burnell Blanks, MD;  Location: [facility] CATH LAB;  Service: Cardiovascular;  Laterality: N/A;  . Percutaneous coronary stent intervention (pci-s) N/A 04/21/2014    Procedure: PERCUTANEOUS CORONARY STENT INTERVENTION (PCI-S);  Surgeon: [name], MD;  Location: [facility] CATH LAB;  Service: Cardiovascular;  Laterality: N/A;  . Cataract extraction w/ intraocular lens  implant, bilateral    . Colonoscopy    . Open reduction internal fixation (orif) distal radial fracture Left 02/24/2016    Procedure: OPEN TREATMENT OF LEFT DISTAL RADIUS FRACTURE;  Surgeon: Milly Jakob, MD;  Location: [place];  Service: Orthopedics;  Laterality: Left;    Family History  Problem Relation Age of Onset  . Colon cancer Father   . Stroke Mother   . CAD Brother   . Stroke Sister   . Bone cancer Brother   . Parkinson's disease Brother   . Dementia Sister   . Stroke Sister    Social History:  reports that he has quit smoking. His smoking use included Cigarettes. He has a 7.5 pack-year smoking history. He has never used smokeless tobacco. He reports that he does not drink alcohol or use illicit drugs.  Allergies: No Known Allergies  Medications Prior to  Admission  Medication Sig Dispense Refill  . Ascorbic Acid (VITAMIN C) 1000 MG tablet Take 1,000 mg by mouth every other day.    Marland Kitchen aspirin EC 81 MG tablet Take 81 mg by mouth daily.    Marland Kitchen atorvastatin (LIPITOR) 40 MG tablet TAKE 1 TABLET (40 MG TOTAL) BY MOUTH DAILY AT 6 PM. 30 tablet 1  . B Complex-C (B-COMPLEX WITH VITAMIN C) tablet Take 1 tablet by mouth 3 (three) times a week.    . cephALEXin (KEFLEX) 500 MG capsule Take 500 mg by mouth 4 (four) times daily.     . CHLOROPHYLL PO Take 2 tablets by mouth daily.    . Coenzyme Q10 (CO Q 10 PO) Take 1 capsule by mouth daily.    . finasteride (PROSCAR) 5 MG tablet Take 5 mg by mouth daily.  11  . folic acid (FOLVITE) 833  MCG tablet Take 800 mcg by mouth every other day.    . Garlic 825 MG TABS Take 500 mg by mouth every other day.    . hydrochlorothiazide (MICROZIDE) 12.5 MG capsule TAKE 1 CAPSULE (12.5 MG TOTAL) BY MOUTH DAILY. 30 capsule 6  . HYDROcodone-acetaminophen (NORCO/VICODIN) 5-325 MG tablet Take 1 tablet by mouth every 4 (four) hours as needed for moderate pain or severe pain. 10 tablet 0  . isosorbide mononitrate (IMDUR) 60 MG 24 hr tablet Take 60 mg by mouth daily.    Marland Kitchen levothyroxine (SYNTHROID, LEVOTHROID) 125 MCG tablet Take 125 mcg by mouth daily before breakfast.   3  . Magnesium 200 MG TABS Take 200 mg by mouth daily.    . metoprolol tartrate (LOPRESSOR) 25 MG tablet Take 25 mg by mouth daily.    . Multiple Vitamins-Iron (CHLORELLA PO) Take 4 tablets by mouth 2 (two) times daily. MED NAME: CHLORELLA    . NAT-RUL PSYLLIUM SEED HUSKS PO Take 610 mg by mouth daily.     . Potassium 99 MG TABS Take 99 mg by mouth every other day.     . SELENIUM PO Take 1 tablet by mouth every other day.    . sulfamethoxazole-trimethoprim (BACTRIM DS,SEPTRA DS) 800-160 MG tablet Take 1 tablet by mouth 2 (two) times daily.    . tamsulosin (FLOMAX) 0.4 MG CAPS capsule Take 1 capsule (0.4 mg total) by mouth daily. 30 capsule 0  . VITAMIN D, ERGOCALCIFEROL, PO Take 1,000 Units by mouth daily.     . nitroGLYCERIN (NITROSTAT) 0.4 MG SL tablet Place 1 tablet (0.4 mg total) under the tongue every 5 (five) minutes as needed for chest pain. 25 tablet 11    Results for orders placed or performed during the hospital encounter of 03/09/16 (from the past 48 hour(s))  CBC WITH DIFFERENTIAL     Status: Abnormal   Collection Time: 03/09/16  3:49 PM  Result Value Ref Range   WBC 6.0 4.0 - 10.5 K/uL   RBC 4.23 4.22 - 5.81 MIL/uL   Hemoglobin 10.9 (L) 13.0 - 17.0 g/dL   HCT 35.8 (L) 39.0 - 52.0 %   MCV 84.6 78.0 - 100.0 fL   MCH 25.8 (L) 26.0 - 34.0 pg   MCHC 30.4 30.0 - 36.0 g/dL   RDW 14.9 11.5 - 15.5 %   Platelets 247 150 -  400 K/uL   Neutrophils Relative % 60 %   Neutro Abs 3.7 1.7 - 7.7 K/uL   Lymphocytes Relative 31 %   Lymphs Abs 1.9 0.7 - 4.0 K/uL   Monocytes Relative 6 %  Monocytes Absolute 0.3 0.1 - 1.0 K/uL   Eosinophils Relative 2 %   Eosinophils Absolute 0.1 0.0 - 0.7 K/uL   Basophils Relative 1 %   Basophils Absolute 0.0 0.0 - 0.1 K/uL  Basic metabolic panel     Status: None   Collection Time: 03/09/16  3:49 PM  Result Value Ref Range   Sodium 139 135 - 145 mmol/L   Potassium 3.7 3.5 - 5.1 mmol/L   Chloride 102 101 - 111 mmol/L   CO2 30 22 - 32 mmol/L   Glucose, Bld 96 65 - 99 mg/dL   BUN 10 6 - 20 mg/dL   Creatinine, Ser 1.05 0.61 - 1.24 mg/dL   Calcium 8.9 8.9 - 10.3 mg/dL   GFR calc non Af Amer >60 >60 mL/min   GFR calc Af Amer >60 >60 mL/min    Comment: (NOTE) The eGFR has been calculated using the CKD EPI equation. This calculation has not been validated in all clinical situations. eGFR's persistently <60 mL/min signify possible Chronic Kidney Disease.    Anion gap 7 5 - 15  Protime-INR     Status: Abnormal   Collection Time: 03/09/16  3:49 PM  Result Value Ref Range   Prothrombin Time 15.4 (H) 11.6 - 15.2 seconds   INR 1.20 0.00 - 1.49  Surgical pcr screen     Status: None   Collection Time: 03/09/16  4:48 PM  Result Value Ref Range   MRSA, PCR NEGATIVE NEGATIVE   Staphylococcus aureus NEGATIVE NEGATIVE    Comment:        The Xpert SA Assay (FDA approved for NASAL specimens in patients over 31 years of age), is one component of a comprehensive surveillance program.  Test performance has been validated by [facility] for patients greater than or equal to 53 year old. It is not intended to diagnose infection nor to guide or monitor treatment.    No results found.  Review of Systems  All other systems reviewed and are negative.   Blood pressure 134/49, pulse 55, temperature 98.4 F (36.9 C), temperature source Oral, resp. rate 18, height 5' 10" (1.778 m),  weight 100.789 kg (222 lb 3.2 oz), SpO2 99 %. Physical Exam  Constitutional: He is oriented to person, place, and time. He appears well-developed and well-nourished.  HENT:  Head: Normocephalic.  Eyes: Pupils are equal, round, and reactive to light.  Neck: Normal range of motion.  Cardiovascular: Intact distal pulses.   Respiratory: Effort normal.  GI: Soft.  Musculoskeletal:  There is mild redness of the volar aspect of the left distal forearm.  Neurovascularly intact, with reasonably good digital motion.  There is been some persistent drainage evident in the gauze over a circular area that has opened adjacent to the proximal portion of the incision.  It is several millimeters in diameter.  There is some expressible drainage from it that is seropurulent.  Neurological: He is alert and oriented to person, place, and time. He has normal reflexes.  Skin: Skin is warm and dry.  Psychiatric: He has a normal mood and affect. His behavior is normal. Judgment and thought content normal.     Assessment/Plan Suspected deep infection of left forearm status post ORIF of distal radius fracture.  Admission Friday antibiotics and surgical incision/drainage/debridement is indicated.  Goals/risks/options reviewed and discussed with patient and family and patient consent obtained.  THOMPSON, DAVID A., MD 03/09/2016, 8:15 PM

## 2016-03-09 NOTE — Anesthesia Postprocedure Evaluation (Signed)
Anesthesia Post Note  Patient: Tim Campbell  Procedure(s) Performed: Procedure(s) (LRB): IRRIGATION AND DEBRIDEMENT WRIST (Left)  Patient location during evaluation: PACU Anesthesia Type: General Level of consciousness: awake and alert and patient cooperative Pain management: pain level controlled Vital Signs Assessment: post-procedure vital signs reviewed and stable Respiratory status: spontaneous breathing and respiratory function stable Cardiovascular status: stable Anesthetic complications: no    Last Vitals:  Filed Vitals:   03/09/16 2149 03/09/16 2215  BP: 137/64 136/63  Pulse: 73 70  Temp: 36.6 C 36.8 C  Resp: 23 19    Last Pain:  Filed Vitals:   03/09/16 2216  PainSc: 0-No pain                 Giordano Getman S

## 2016-03-09 NOTE — Anesthesia Procedure Notes (Addendum)
Anesthesia Regional Block:  Supraclavicular block  Pre-Anesthetic Checklist: ,, timeout performed, Correct Patient, Correct Site, Correct Laterality, Correct Procedure, Correct Position, site marked, Risks and benefits discussed,  Surgical consent,  Pre-op evaluation,  At surgeon's request and post-op pain management  Laterality: Left  Prep: chloraprep       Needles:  Injection technique: Single-shot  Needle Type: Echogenic Stimulator Needle     Needle Length: 9cm 9 cm Needle Gauge: 21 and 21 G    Additional Needles:  Procedures: ultrasound guided (picture in chart) and nerve stimulator Supraclavicular block  Nerve Stimulator or Paresthesia:  Response: biceps flexion, 0.45 mA,   Additional Responses:   Narrative:  Start time: 03/09/2016 8:07 PM End time: 03/09/2016 8:13 PM Injection made incrementally with aspirations every 5 mL.  Performed by: Personally  Anesthesiologist: HODIERNE, ADAM  Additional Notes: Functioning IV was confirmed and monitors were applied.  A 92mm 22ga Arrow echogenic stimulator needle was used. Sterile prep and drape,hand hygiene and sterile gloves were used.  Negative aspiration and negative test dose prior to incremental administration of local anesthetic. The patient tolerated the procedure well.  Ultrasound guidance: relevent anatomy identified, needle position confirmed, local anesthetic spread visualized around nerve(s), vascular puncture avoided.  Image printed for medical record.    Procedure Name: LMA Insertion Date/Time: 03/09/2016 8:32 PM Performed by: Valetta Fuller Pre-anesthesia Checklist: Patient identified, Emergency Drugs available, Suction available and Patient being monitored Patient Re-evaluated:Patient Re-evaluated prior to inductionOxygen Delivery Method: Circle system utilized Preoxygenation: Pre-oxygenation with 100% oxygen Intubation Type: IV induction Ventilation: Mask ventilation without difficulty LMA: LMA  inserted LMA Size: 5.0 Number of attempts: 1 Placement Confirmation: positive ETCO2 Tube secured with: Tape Dental Injury: Teeth and Oropharynx as per pre-operative assessment

## 2016-03-10 MED ORDER — METOPROLOL TARTRATE 25 MG PO TABS
25.0000 mg | ORAL_TABLET | Freq: Every day | ORAL | Status: DC
  Administered 2016-03-11 – 2016-03-12 (×2): 25 mg via ORAL
  Filled 2016-03-10 (×3): qty 1

## 2016-03-10 MED ORDER — LEVOTHYROXINE SODIUM 100 MCG PO TABS
125.0000 ug | ORAL_TABLET | Freq: Every day | ORAL | Status: DC
  Administered 2016-03-10 – 2016-03-12 (×3): 125 ug via ORAL
  Filled 2016-03-10 (×3): qty 1

## 2016-03-10 MED ORDER — B COMPLEX-C PO TABS
1.0000 | ORAL_TABLET | ORAL | Status: DC
  Administered 2016-03-12: 1 via ORAL
  Filled 2016-03-10: qty 1

## 2016-03-10 MED ORDER — NITROGLYCERIN 0.4 MG SL SUBL: 0.4000 mg | SUBLINGUAL_TABLET | SUBLINGUAL | Status: DC | PRN

## 2016-03-10 MED ORDER — ATORVASTATIN CALCIUM 40 MG PO TABS
40.0000 mg | ORAL_TABLET | Freq: Every day | ORAL | Status: DC
  Administered 2016-03-10 – 2016-03-11 (×2): 40 mg via ORAL
  Filled 2016-03-10 (×2): qty 1

## 2016-03-10 MED ORDER — FINASTERIDE 5 MG PO TABS
5.0000 mg | ORAL_TABLET | Freq: Every day | ORAL | Status: DC
  Administered 2016-03-10 – 2016-03-12 (×3): 5 mg via ORAL
  Filled 2016-03-10 (×3): qty 1

## 2016-03-10 MED ORDER — VITAMIN C 500 MG PO TABS
1000.0000 mg | ORAL_TABLET | ORAL | Status: DC
  Administered 2016-03-11: 1000 mg via ORAL
  Filled 2016-03-10: qty 2

## 2016-03-10 MED ORDER — GARLIC 500 MG PO TABS: 500.0000 mg | ORAL_TABLET | ORAL | Status: DC

## 2016-03-10 MED ORDER — HYDROMORPHONE HCL 1 MG/ML IJ SOLN: 0.5000 mg | INTRAMUSCULAR | Status: DC | PRN

## 2016-03-10 MED ORDER — FOLIC ACID 1 MG PO TABS
1.0000 mg | ORAL_TABLET | ORAL | Status: DC
  Administered 2016-03-11: 1 mg via ORAL

## 2016-03-10 MED ORDER — MAGNESIUM HYDROXIDE 400 MG/5ML PO SUSP: 30.0000 mL | Freq: Every day | ORAL | Status: DC | PRN

## 2016-03-10 MED ORDER — ONDANSETRON HCL 4 MG PO TABS: 4.0000 mg | ORAL_TABLET | Freq: Four times a day (QID) | ORAL | Status: DC | PRN

## 2016-03-10 MED ORDER — POTASSIUM CHLORIDE ER 8 MEQ PO TBCR
8.0000 meq | EXTENDED_RELEASE_TABLET | ORAL | Status: DC
  Administered 2016-03-11: 8 meq via ORAL
  Filled 2016-03-10: qty 1

## 2016-03-10 MED ORDER — MAGNESIUM OXIDE 400 (241.3 MG) MG PO TABS
200.0000 mg | ORAL_TABLET | Freq: Every day | ORAL | Status: DC
  Administered 2016-03-10 – 2016-03-12 (×3): 200 mg via ORAL
  Filled 2016-03-10 (×3): qty 1

## 2016-03-10 MED ORDER — DOCUSATE SODIUM 100 MG PO CAPS
100.0000 mg | ORAL_CAPSULE | Freq: Two times a day (BID) | ORAL | Status: DC
  Administered 2016-03-10 – 2016-03-12 (×5): 100 mg via ORAL
  Filled 2016-03-10 (×5): qty 1

## 2016-03-10 MED ORDER — TAMSULOSIN HCL 0.4 MG PO CAPS
0.4000 mg | ORAL_CAPSULE | Freq: Every day | ORAL | Status: DC
  Administered 2016-03-10 – 2016-03-12 (×3): 0.4 mg via ORAL
  Filled 2016-03-10 (×3): qty 1

## 2016-03-10 MED ORDER — ONDANSETRON HCL 4 MG/2ML IJ SOLN: 4.0000 mg | Freq: Four times a day (QID) | INTRAMUSCULAR | Status: DC | PRN

## 2016-03-10 MED ORDER — HYDROCODONE-ACETAMINOPHEN 5-325 MG PO TABS
1.0000 | ORAL_TABLET | ORAL | Status: DC | PRN
  Administered 2016-03-10 – 2016-03-11 (×3): 1 via ORAL
  Filled 2016-03-10 (×4): qty 1

## 2016-03-10 MED ORDER — HYDROCHLOROTHIAZIDE 12.5 MG PO CAPS
12.5000 mg | ORAL_CAPSULE | Freq: Every day | ORAL | Status: DC
  Administered 2016-03-10 – 2016-03-12 (×3): 12.5 mg via ORAL
  Filled 2016-03-10 (×3): qty 1

## 2016-03-10 MED ORDER — ASPIRIN EC 81 MG PO TBEC
81.0000 mg | DELAYED_RELEASE_TABLET | Freq: Every day | ORAL | Status: DC
  Administered 2016-03-10 – 2016-03-12 (×3): 81 mg via ORAL
  Filled 2016-03-10 (×3): qty 1

## 2016-03-10 MED ORDER — ISOSORBIDE MONONITRATE ER 60 MG PO TB24
60.0000 mg | ORAL_TABLET | Freq: Every day | ORAL | Status: DC
  Administered 2016-03-10 – 2016-03-12 (×3): 60 mg via ORAL
  Filled 2016-03-10 (×3): qty 1

## 2016-03-10 NOTE — Progress Notes (Signed)
PATIENT ID: Tim Campbell  MRN: [id number]  DOB/AGE:  [birth year] / 82 y.o.  1 Day Post-Op Procedure(s) (LRB): IRRIGATION AND DEBRIDEMENT WRIST (Left)    PROGRESS NOTE Subjective:   Patient is alert, oriented, no Nausea, no Vomiting, yes passing gas, no Bowel Movement. Taking PO with small bites. Denies SOB, Chest or Calf Pain. Using Incentive Spirometer, PAS in place. Pt has splint on left wrist, Patient reports pain as mild at rest.   Objective: Vital signs in last 24 hours: Temp:  [97.9 F (36.6 C)-98.6 F (37 C)] 98.1 F (36.7 C) (06/03 0647) Pulse Rate:  [55-79] 79 (06/03 0647) Resp:  [17-23] 20 (06/03 0647) BP: (114-137)/(49-69) 132/58 mmHg (06/03 0647) SpO2:  [94 %-100 %] 97 % (06/03 0647) Weight:  [100.789 kg (222 lb 3.2 oz)] 100.789 kg (222 lb 3.2 oz) (06/02 1106)    Intake/Output from previous day: I/O last 3 completed shifts: In: 250 [I.V.:250] Out: 1220 [Urine:1200; Blood:20]   Intake/Output this shift:     LABORATORY DATA:  Recent Labs  03/09/16 1549  WBC 6.0  HGB 10.9*  HCT 35.8*  PLT 247  NA 139  K 3.7  CL 102  CO2 30  BUN 10  CREATININE 1.05  GLUCOSE 96  INR 1.20  CALCIUM 8.9    Examination: Neurologically intact Neurovascular intact Sensation intact distally Intact pulses distally Dorsiflexion/Plantar flexion intact Incision: dressing C/D/I}  Assessment:   1 Day Post-Op Procedure(s) (LRB): IRRIGATION AND DEBRIDEMENT WRIST (Left) ADDITIONAL DIAGNOSIS:  Hypertension and CAD  Plan: DISCHARGE PLAN: Home once wound is reexamined by Dr. Grandville Silos.  DISCHARGE NEEDS: IV Antibiotics     Starlee Corralejo R 03/10/2016, 7:39 AM

## 2016-03-10 NOTE — Telephone Encounter (Signed)
Start spironolactone 25 mg daily. BMET in 7 days.

## 2016-03-11 DIAGNOSIS — T8149XA Infection following a procedure, other surgical site, initial encounter: Secondary | ICD-10-CM | POA: Diagnosis present

## 2016-03-11 LAB — CBC
HCT: 38.6 % — ABNORMAL LOW (ref 39.0–52.0)
HEMOGLOBIN: 12.1 g/dL — AB (ref 13.0–17.0)
MCH: 26.2 pg (ref 26.0–34.0)
MCHC: 31.3 g/dL (ref 30.0–36.0)
MCV: 83.7 fL (ref 78.0–100.0)
Platelets: 265 10*3/uL (ref 150–400)
RBC: 4.61 MIL/uL (ref 4.22–5.81)
RDW: 15 % (ref 11.5–15.5)
WBC: 7.3 10*3/uL (ref 4.0–10.5)

## 2016-03-11 NOTE — Progress Notes (Signed)
Feels better, minimal pain med use since hospitalized [facility], fingers a little swollen No drainage from wound, some surrounding redness still Cxs--GNR, no speciation or sensitivities yet  Plan: 1. Dressing changed by me--may now go back into splint 2. Reviewed and stressed ROM exercises for digits 3. Will get ESR/CRP in am 4. Hopefully sensitivities will return tomorrow, allowing antibiotic plan for d/c to be finalized 5. D/C'd Vanc based on GNR

## 2016-03-11 NOTE — Progress Notes (Signed)
PATIENT ID: Tim Campbell  MRN: [id number]  DOB/AGE:  1925-12-27 / 82 y.o.  2 Days Post-Op Procedure(s) (LRB): IRRIGATION AND DEBRIDEMENT WRIST (Left)    PROGRESS NOTE Subjective:   Patient is alert, oriented, no Nausea, no Vomiting, yes passing gas, no Bowel Movement. Taking PO well. Denies SOB, Chest or Calf Pain. Using Incentive Spirometer, PAS in place.  Patient reports pain as mild,     Objective: Vital signs in last 24 hours: Temp:  [97.8 F (36.6 C)-98.2 F (36.8 C)] 98.1 F (36.7 C) (06/04 0556) Pulse Rate:  [55-76] 55 (06/04 0556) Resp:  [18-20] 18 (06/04 0556) BP: (110-136)/(45-65) 126/65 mmHg (06/04 0556) SpO2:  [96 %-98 %] 98 % (06/04 0556)    Intake/Output from previous day: I/O last 3 completed shifts: In: 250 [I.V.:250] Out: 1870 [Urine:1850; Blood:20]   Intake/Output this shift:     LABORATORY DATA:  Recent Labs  03/09/16 1549 03/11/16 0522  WBC 6.0 7.3  HGB 10.9* 12.1*  HCT 35.8* 38.6*  PLT 247 265  NA 139  --   K 3.7  --   CL 102  --   CO2 30  --   BUN 10  --   CREATININE 1.05  --   GLUCOSE 96  --   INR 1.20  --   CALCIUM 8.9  --     Examination: Neurologically intact Neurovascular intact Sensation intact distally Incision: dressing C/D/I} Pt able to freely wiggle his fingers, brisk cap refill Assessment:   2 Days Post-Op Procedure(s) (LRB): IRRIGATION AND DEBRIDEMENT WRIST (Left) ADDITIONAL DIAGNOSIS:  Hypertension and CAD  Plan: Pt will have wound reexamined by Dr. Grandville Campbell today and then determine if he is ready for discharge home or needs to stay another day.  Anticipate D/C with IV antibiotics vs oral.       Tim Campbell 03/11/2016, 7:20 AM

## 2016-03-12 ENCOUNTER — Encounter (HOSPITAL_COMMUNITY): Payer: Self-pay | Admitting: Orthopedic Surgery

## 2016-03-12 LAB — CBC
HCT: 37.5 % — ABNORMAL LOW (ref 39.0–52.0)
HEMOGLOBIN: 11.2 g/dL — AB (ref 13.0–17.0)
MCH: 25.5 pg — ABNORMAL LOW (ref 26.0–34.0)
MCHC: 29.9 g/dL — AB (ref 30.0–36.0)
MCV: 85.2 fL (ref 78.0–100.0)
Platelets: 251 10*3/uL (ref 150–400)
RBC: 4.4 MIL/uL (ref 4.22–5.81)
RDW: 15 % (ref 11.5–15.5)
WBC: 6.1 10*3/uL (ref 4.0–10.5)

## 2016-03-12 LAB — AEROBIC CULTURE  (SUPERFICIAL SPECIMEN)

## 2016-03-12 LAB — SEDIMENTATION RATE: SED RATE: 16 mm/h (ref 0–16)

## 2016-03-12 LAB — C-REACTIVE PROTEIN: CRP: 0.8 mg/dL (ref <1.0)

## 2016-03-12 MED ORDER — CIPROFLOXACIN HCL 750 MG PO TABS: 750.0000 mg | ORAL_TABLET | Freq: Two times a day (BID) | ORAL | Status: AC

## 2016-03-12 NOTE — Care Management Important Message (Signed)
Important Message  Patient Details  Name: [name] MRN: [id number] Date of Birth: 07-17-1926   Medicare Important Message Given:  Yes    Loann Quill 03/12/2016, 12:02 PM

## 2016-03-12 NOTE — Discharge Instructions (Signed)
Discharge Instructions   You have a light dressing on your hand--change it daily until there is no more drainage. You may begin gentle motion of your fingers and hand immediately, but you should not do any heavy lifting or gripping.  Elevate your hand to reduce pain & swelling of the digits.  Ice over the operative site may be helpful to reduce pain & swelling.  DO NOT USE HEAT. Pain medicine has been prescribed for you.  Use your medicine as needed over the first 48 hours, and then you can begin to taper your use. You may use Tylenol in place of your prescribed pain medication, but not IN ADDITION to it. You may shower, regularly washing the incision and letting the water run over it, but not submerging it (no swimming, soaking it in dishwater, etc.) You may drive a car when you are off of prescription pain medications and can safely control your vehicle with both hands. Continue hand therapy   Please call [phone] during normal business hours or [phone] after hours for any problems. Including the following:  - excessive redness of the incisions - drainage for more than 4 days - fever of more than 101.5 F  *Please note that pain medications will not be refilled after hours or on weekends.

## 2016-03-12 NOTE — Telephone Encounter (Signed)
Called pt daughter Jackelyn Poling to make her aware of Dr.Smith's recommendation. Start spironolactone 25 mg daily. BMET in 7 days. [name] sts that the pt has been hospitalized for a wound infection related to his fractured wrist. He is scheduled to be d/c from [facility] today. Pt LE swelling has improved, during pt's hospitalization he has had compression therapy,[name] is concerned that the pt's LE swelling will worsen once he returns home.  Sonda Rumble that I will fwd an update to Dr.Smith, and call back to let her know if his recommendation is the same or he is recommending something additional. [name] voiced appreciation and verbalized understanding.

## 2016-03-12 NOTE — Discharge Summary (Signed)
Physician Discharge Summary  Patient ID: Tim Campbell MRN: [id number] DOB/AGE: 12/21/1925 82 y.o.  Admit date: 03/09/2016 Discharge date: 03/12/2016  Admission Diagnoses:  Left forearm postop wound infection  Discharge Diagnoses:  Active Problems:   Postoperative wound infection   Past Medical History  Diagnosis Date  . Coronary artery disease     with occluded SVG to diagonal and BMS to prox SVG of RCA 10/2010. Recth 12/2012, 70% ISR SVG to RCA, 50% SVG to Cfx, 100% SVG to diagonal, and Patent LIMA with diffuse LAD dz.  . Hypertension   . Non-ST elevation MI (NSTEMI) ([place])   . Hyperlipidemia   . Insomnia   . Vitamin D deficiency   . Aortic stenosis     severe by echo September 2014  . Hypothyroidism   . RBBB   . Diverticulosis   . Non Hodgkin's lymphoma ([place])   . Enlarged prostate     per patient  with mild effect on urine stream  . Arthritis     osteo knees "   . Chronic combined systolic and diastolic HF (heart failure), NYHA class 3 09/09/2013    LVEF 45%.   Marland Kitchen Heart murmur     Surgeries: Procedure(s): IRRIGATION AND DEBRIDEMENT WRIST on 03/09/2016   Consultants (if any):    Discharged Condition: Improved  Hospital Course: Tim Campbell is an 82 y.o. male who was admitted 03/09/2016 with a diagnosis of left forearm postop wound infection and went to the operating room on 03/09/2016 and underwent the above named procedures.    He was given perioperative antibiotics:  Anti-infectives    Start     Dose/Rate Route Frequency Ordered Stop   03/10/16 0400  vancomycin (VANCOCIN) IVPB 750 mg/150 ml premix  Status:  Discontinued     750 mg 150 mL/hr over 60 Minutes Intravenous Every 12 hours 03/09/16 1528 03/11/16 1903   03/09/16 1700  vancomycin (VANCOCIN) 1,500 mg in sodium chloride 0.9 % 500 mL IVPB  Status:  Discontinued     1,500 mg 250 mL/hr over 120 Minutes Intravenous  Once 03/09/16 1635 03/09/16 1642   03/09/16 1645  vancomycin (VANCOCIN) 1,500 mg in sodium  chloride 0.9 % 250 mL IVPB     1,500 mg 250 mL/hr over 60 Minutes Intravenous  Once 03/09/16 1642 03/09/16 1745   03/09/16 1626  vancomycin (VANCOCIN) 1,500 mg in sodium chloride 0.9 % 250 mL IVPB  Status:  Discontinued     1,500 mg 250 mL/hr over 60 Minutes Intravenous Every Dialysis 03/09/16 1626 03/09/16 1636   03/09/16 1600  vancomycin (VANCOCIN) 1,500 mg in sodium chloride 0.9 % 500 mL IVPB  Status:  Discontinued     1,500 mg 250 mL/hr over 120 Minutes Intravenous  Once 03/09/16 1528 03/09/16 1627   03/09/16 1600  piperacillin-tazobactam (ZOSYN) IVPB 3.375 g     3.375 g 12.5 mL/hr over 240 Minutes Intravenous Every 8 hours 03/09/16 1528      .  He was given sequential compression devices, early ambulation, DVT prophylaxis.  He benefited maximally from the hospital stay and there were no complications. Cx revealed rare GNR, Enterbactor Cloacae, with multiple sensitivities.  Will d/c on Cipro and he may also finish the bactrim he has in his possession.   Recent vital signs:  Filed Vitals:   03/11/16 2103 03/12/16 0553  BP: 128/70 121/85  Pulse: 67 64  Temp: 97.5 F (36.4 C) 97.7 F (36.5 C)  Resp: 16 18    Recent laboratory  studies:  Lab Results  Component Value Date   HGB 11.2* 03/12/2016   HGB 12.1* 03/11/2016   HGB 10.9* 03/09/2016   Lab Results  Component Value Date   WBC 6.1 03/12/2016   PLT 251 03/12/2016   Lab Results  Component Value Date   INR 1.20 03/09/2016   Lab Results  Component Value Date   NA 139 03/09/2016   K 3.7 03/09/2016   CL 102 03/09/2016   CO2 30 03/09/2016   BUN 10 03/09/2016   CREATININE 1.05 03/09/2016   GLUCOSE 96 03/09/2016    Discharge Medications:     Medication List    ASK your doctor about these medications        aspirin EC 81 MG tablet  Take 81 mg by mouth daily.     atorvastatin 40 MG tablet  Commonly known as:  LIPITOR  TAKE 1 TABLET (40 MG TOTAL) BY MOUTH DAILY AT 6 PM.     B-complex with vitamin C tablet   Take 1 tablet by mouth 3 (three) times a week.     cephALEXin 500 MG capsule  Commonly known as:  KEFLEX  Take 500 mg by mouth 4 (four) times daily.     CHLORELLA PO  Take 4 tablets by mouth 2 (two) times daily. MED NAME: CHLORELLA     CHLOROPHYLL PO  Take 2 tablets by mouth daily.     CO Q 10 PO  Take 1 capsule by mouth daily.     finasteride 5 MG tablet  Commonly known as:  PROSCAR  Take 5 mg by mouth daily.     folic acid [id number] MCG tablet  Commonly known as:  FOLVITE  Take 800 mcg by mouth every other day.     Garlic [id number] MG Tabs  Take 500 mg by mouth every other day.     hydrochlorothiazide 12.5 MG capsule  Commonly known as:  MICROZIDE  TAKE 1 CAPSULE (12.5 MG TOTAL) BY MOUTH DAILY.     HYDROcodone-acetaminophen 5-325 MG tablet  Commonly known as:  NORCO/VICODIN  Take 1 tablet by mouth every 4 (four) hours as needed for moderate pain or severe pain.     isosorbide mononitrate 60 MG 24 hr tablet  Commonly known as:  IMDUR  Take 60 mg by mouth daily.     levothyroxine 125 MCG tablet  Commonly known as:  SYNTHROID, LEVOTHROID  Take 125 mcg by mouth daily before breakfast.     Magnesium 200 MG Tabs  Take 200 mg by mouth daily.     metoprolol tartrate 25 MG tablet  Commonly known as:  LOPRESSOR  Take 25 mg by mouth daily.     NAT-RUL PSYLLIUM SEED HUSKS PO  Take 610 mg by mouth daily.     nitroGLYCERIN 0.4 MG SL tablet  Commonly known as:  NITROSTAT  Place 1 tablet (0.4 mg total) under the tongue every 5 (five) minutes as needed for chest pain.     Potassium 99 MG Tabs  Take 99 mg by mouth every other day.     SELENIUM PO  Take 1 tablet by mouth every other day.     sulfamethoxazole-trimethoprim 800-160 MG tablet  Commonly known as:  BACTRIM DS,SEPTRA DS  Take 1 tablet by mouth 2 (two) times daily.     tamsulosin 0.4 MG Caps capsule  Commonly known as:  FLOMAX  Take 1 capsule (0.4 mg total) by mouth daily.     vitamin C 1000 MG tablet  Take 1,000  mg by mouth every other day.     VITAMIN D (ERGOCALCIFEROL) PO  Take 1,000 Units by mouth daily.        Diagnostic Studies: Dg Wrist Complete Left  Mar 10, 2016  CLINICAL DATA:  Post open reduction internal fixation left wrist EXAM: DG C-ARM 61-120 MIN; LEFT WRIST - COMPLETE 3+ VIEW COMPARISON:  02/04/2016 FINDINGS: Three views of the left wrist submitted. The patient is status post open reduction internal fixation of comminuted fracture distal left radius. A metallic fixation plate and fixation screws are noted in distal left radius with improvement in alignment. There is healing fracture of the ulnar styloid with alignment preserved. IMPRESSION: The patient is status post open reduction internal fixation of comminuted fracture distal left radius. A metallic fixation plate and fixation screws are noted in distal left radius with improvement in alignment. There is healing fracture of the ulnar styloid with alignment preserved. Fluoroscopy time was 1 minutes 42 seconds. Please see the operative report. Electronically Signed   By: Lahoma Crocker M.D.   On: 03/10/16 09:16   Dg C-arm 61-120 Min  03-10-16  CLINICAL DATA:  Post open reduction internal fixation left wrist EXAM: DG C-ARM 61-120 MIN; LEFT WRIST - COMPLETE 3+ VIEW COMPARISON:  02/04/2016 FINDINGS: Three views of the left wrist submitted. The patient is status post open reduction internal fixation of comminuted fracture distal left radius. A metallic fixation plate and fixation screws are noted in distal left radius with improvement in alignment. There is healing fracture of the ulnar styloid with alignment preserved. IMPRESSION: The patient is status post open reduction internal fixation of comminuted fracture distal left radius. A metallic fixation plate and fixation screws are noted in distal left radius with improvement in alignment. There is healing fracture of the ulnar styloid with alignment preserved. Fluoroscopy time was 1 minutes 42  seconds. Please see the operative report. Electronically Signed   By: Lahoma Crocker M.D.   On: 10-Mar-2016 09:16    Disposition: 01-Home or Self Care       Signed: Nardos Putnam A. 03/12/2016, 10:23 AM

## 2016-03-12 NOTE — Telephone Encounter (Signed)
Pt daughter Jackelyn Poling aware of Dr.Smith's recommendation. Rather than starting new therapy, let's see what happens now that infection resolved.  Jackelyn Poling is agreeable with the plan, she will call in a couple of weeks to give an update on how the pt is doing, the pt would like to proceed with the cardiac cath when possible

## 2016-03-12 NOTE — Telephone Encounter (Signed)
Rather than starting new therapy, let's see what happens now that infection resolved.

## 2016-03-12 NOTE — Care Management Note (Signed)
Case Management Note  Patient Details  Name: Tim Campbell MRN: [id number] Date of Birth: 07-Jun-1926  Subjective/Objective:  S/p I & D of left forearm                   Action/Plan:  Patient has no HH needs. No CM needs identified   Expected Discharge Date:  03/13/16               Expected Discharge Plan:  Home/Self Care  In-House Referral:     Discharge planning Services     Post Acute Care Choice:  NA Choice offered to:  NA  DME Arranged:    DME Agency:  NA  HH Arranged:  NA HH Agency:  NA  Status of Service:  Completed, signed off  Medicare Important Message Given:    Date Medicare IM Given:    Medicare IM give by:    Date Additional Medicare IM Given:    Additional Medicare Important Message give by:     If discussed at [place] of Stay Meetings, dates discussed:    Additional Comments:  Ninfa Meeker, RN 03/12/2016, 10:37 AM

## 2016-03-12 NOTE — Progress Notes (Signed)
Discharge instructions given. Pt verbalized understanding and all questions were answered.  

## 2016-03-14 DIAGNOSIS — S52572D Other intraarticular fracture of lower end of left radius, subsequent encounter for closed fracture with routine healing: Secondary | ICD-10-CM | POA: Diagnosis not present

## 2016-03-14 LAB — ANAEROBIC CULTURE

## 2016-03-15 DIAGNOSIS — S52532D Colles' fracture of left radius, subsequent encounter for closed fracture with routine healing: Secondary | ICD-10-CM | POA: Diagnosis not present

## 2016-03-16 DIAGNOSIS — S52572D Other intraarticular fracture of lower end of left radius, subsequent encounter for closed fracture with routine healing: Secondary | ICD-10-CM | POA: Diagnosis not present

## 2016-03-19 DIAGNOSIS — S52532D Colles' fracture of left radius, subsequent encounter for closed fracture with routine healing: Secondary | ICD-10-CM | POA: Diagnosis not present

## 2016-03-21 DIAGNOSIS — S52532D Colles' fracture of left radius, subsequent encounter for closed fracture with routine healing: Secondary | ICD-10-CM | POA: Diagnosis not present

## 2016-03-27 DIAGNOSIS — S52532D Colles' fracture of left radius, subsequent encounter for closed fracture with routine healing: Secondary | ICD-10-CM | POA: Diagnosis not present

## 2016-03-28 DIAGNOSIS — S52532D Colles' fracture of left radius, subsequent encounter for closed fracture with routine healing: Secondary | ICD-10-CM | POA: Diagnosis not present

## 2016-03-30 DIAGNOSIS — S52532D Colles' fracture of left radius, subsequent encounter for closed fracture with routine healing: Secondary | ICD-10-CM | POA: Diagnosis not present

## 2016-04-02 DIAGNOSIS — S52532D Colles' fracture of left radius, subsequent encounter for closed fracture with routine healing: Secondary | ICD-10-CM | POA: Diagnosis not present

## 2016-04-04 DIAGNOSIS — S52532D Colles' fracture of left radius, subsequent encounter for closed fracture with routine healing: Secondary | ICD-10-CM | POA: Diagnosis not present

## 2016-04-09 DIAGNOSIS — S52532D Colles' fracture of left radius, subsequent encounter for closed fracture with routine healing: Secondary | ICD-10-CM | POA: Diagnosis not present

## 2016-04-10 LAB — FUNGUS CULTURE RESULT

## 2016-04-10 LAB — FUNGUS CULTURE WITH STAIN

## 2016-04-10 LAB — FUNGAL ORGANISM REFLEX

## 2016-04-11 DIAGNOSIS — S52532D Colles' fracture of left radius, subsequent encounter for closed fracture with routine healing: Secondary | ICD-10-CM | POA: Diagnosis not present

## 2016-04-16 DIAGNOSIS — S52532D Colles' fracture of left radius, subsequent encounter for closed fracture with routine healing: Secondary | ICD-10-CM | POA: Diagnosis not present

## 2016-04-18 DIAGNOSIS — S52572D Other intraarticular fracture of lower end of left radius, subsequent encounter for closed fracture with routine healing: Secondary | ICD-10-CM | POA: Diagnosis not present

## 2016-04-18 DIAGNOSIS — T814XXD Infection following a procedure, subsequent encounter: Secondary | ICD-10-CM | POA: Diagnosis not present

## 2016-04-19 DIAGNOSIS — S52532D Colles' fracture of left radius, subsequent encounter for closed fracture with routine healing: Secondary | ICD-10-CM | POA: Diagnosis not present

## 2016-04-23 DIAGNOSIS — S52532D Colles' fracture of left radius, subsequent encounter for closed fracture with routine healing: Secondary | ICD-10-CM | POA: Diagnosis not present

## 2016-04-25 DIAGNOSIS — S52532D Colles' fracture of left radius, subsequent encounter for closed fracture with routine healing: Secondary | ICD-10-CM | POA: Diagnosis not present

## 2016-04-30 DIAGNOSIS — S52532D Colles' fracture of left radius, subsequent encounter for closed fracture with routine healing: Secondary | ICD-10-CM | POA: Diagnosis not present

## 2016-05-01 ENCOUNTER — Other Ambulatory Visit: Payer: Self-pay | Admitting: Interventional Cardiology

## 2016-05-01 DIAGNOSIS — R3915 Urgency of urination: Secondary | ICD-10-CM | POA: Diagnosis not present

## 2016-05-01 DIAGNOSIS — N400 Enlarged prostate without lower urinary tract symptoms: Secondary | ICD-10-CM | POA: Diagnosis not present

## 2016-05-02 DIAGNOSIS — S52532D Colles' fracture of left radius, subsequent encounter for closed fracture with routine healing: Secondary | ICD-10-CM | POA: Diagnosis not present

## 2016-05-07 ENCOUNTER — Telehealth: Payer: Self-pay | Admitting: Interventional Cardiology

## 2016-05-07 DIAGNOSIS — S52532D Colles' fracture of left radius, subsequent encounter for closed fracture with routine healing: Secondary | ICD-10-CM | POA: Diagnosis not present

## 2016-05-07 NOTE — Telephone Encounter (Signed)
Mrs. Ky Barban ( daughter) is calling to get the heart cath schedule for her dad . He was to have it schedule but he fell back in April . Please call   Thanks

## 2016-05-07 NOTE — Telephone Encounter (Signed)
He needs to come in to be reevaluated so that we can generate an H&P for the heart cath. I agree that it is time to proceed with left and right heart cath, coronary angiography, and possible PCI.

## 2016-05-07 NOTE — Telephone Encounter (Signed)
Spoke with patients daughter Jackelyn Poling, adv her that I will fwd the message to Dr.Smit and call back with his response. [name] verbalized understanding and voiced appreciation for the call back

## 2016-05-09 DIAGNOSIS — S52532D Colles' fracture of left radius, subsequent encounter for closed fracture with routine healing: Secondary | ICD-10-CM | POA: Diagnosis not present

## 2016-05-11 NOTE — Telephone Encounter (Signed)
Pt daughter Jackelyn Poling aware of Dr.Smith's response. appt scheduled for 8/8 @ 3:15pm. [name] verbalized understanding and voiced appreciation for the call.

## 2016-05-15 ENCOUNTER — Encounter: Payer: Self-pay | Admitting: Interventional Cardiology

## 2016-05-15 ENCOUNTER — Other Ambulatory Visit: Payer: Self-pay | Admitting: Interventional Cardiology

## 2016-05-15 ENCOUNTER — Ambulatory Visit (INDEPENDENT_AMBULATORY_CARE_PROVIDER_SITE_OTHER): Payer: Medicare Other | Admitting: Interventional Cardiology

## 2016-05-15 VITALS — BP 110/62 | HR 51 | Ht 70.0 in | Wt 216.2 lb

## 2016-05-15 DIAGNOSIS — I209 Angina pectoris, unspecified: Secondary | ICD-10-CM

## 2016-05-15 DIAGNOSIS — I350 Nonrheumatic aortic (valve) stenosis: Secondary | ICD-10-CM | POA: Diagnosis not present

## 2016-05-15 DIAGNOSIS — I5042 Chronic combined systolic (congestive) and diastolic (congestive) heart failure: Secondary | ICD-10-CM

## 2016-05-15 DIAGNOSIS — I25709 Atherosclerosis of coronary artery bypass graft(s), unspecified, with unspecified angina pectoris: Secondary | ICD-10-CM | POA: Diagnosis not present

## 2016-05-15 DIAGNOSIS — E785 Hyperlipidemia, unspecified: Secondary | ICD-10-CM | POA: Diagnosis not present

## 2016-05-15 DIAGNOSIS — I10 Essential (primary) hypertension: Secondary | ICD-10-CM | POA: Diagnosis not present

## 2016-05-15 DIAGNOSIS — I25810 Atherosclerosis of coronary artery bypass graft(s) without angina pectoris: Secondary | ICD-10-CM | POA: Diagnosis not present

## 2016-05-15 LAB — CBC WITH DIFFERENTIAL/PLATELET
BASOS ABS: 63 {cells}/uL (ref 0–200)
Basophils Relative: 1 %
EOS ABS: 126 {cells}/uL (ref 15–500)
Eosinophils Relative: 2 %
HEMATOCRIT: 38.2 % — AB (ref 38.5–50.0)
Hemoglobin: 12.1 g/dL — ABNORMAL LOW (ref 13.2–17.1)
LYMPHS ABS: 2079 {cells}/uL (ref 850–3900)
LYMPHS PCT: 33 %
MCH: 26 pg — AB (ref 27.0–33.0)
MCHC: 31.7 g/dL — ABNORMAL LOW (ref 32.0–36.0)
MCV: 82.2 fL (ref 80.0–100.0)
MONOS PCT: 9 %
MPV: 9.5 fL (ref 7.5–12.5)
Monocytes Absolute: 567 cells/uL (ref 200–950)
NEUTROS ABS: 3465 {cells}/uL (ref 1500–7800)
Neutrophils Relative %: 55 %
PLATELETS: 147 10*3/uL (ref 140–400)
RBC: 4.65 MIL/uL (ref 4.20–5.80)
RDW: 17.6 % — ABNORMAL HIGH (ref 11.0–15.0)
WBC: 6.3 10*3/uL (ref 3.8–10.8)

## 2016-05-15 NOTE — Patient Instructions (Signed)
Medication Instructions:  Your physician recommends that you continue on your current medications as directed. Please refer to the Current Medication list given to you today.   Labwork: Bmet, Cbc, Pt/Inr  Testing/Procedures: Your physician has requested that you have a cardiac catheterization. Cardiac catheterization is used to diagnose and/or treat various heart conditions. Doctors may recommend this procedure for a number of different reasons. The most common reason is to evaluate chest pain. Chest pain can be a symptom of coronary artery disease (CAD), and cardiac catheterization can show whether plaque is narrowing or blocking your heart's arteries. This procedure is also used to evaluate the valves, as well as measure the blood flow and oxygen levels in different parts of your heart. For further information please visit [email or web address]. Please follow instruction sheet, as given.    Follow-Up: Your physician recommends that you schedule a follow-up appointment pending procedure   Any Other Special Instructions Will Be Listed Below (If Applicable).     If you need a refill on your cardiac medications before your next appointment, please call your pharmacy.

## 2016-05-15 NOTE — Progress Notes (Signed)
Cardiology Office Note    Date:  05/15/2016   ID:  Tim Campbell, DOB 03-24-1926, MRN [id number]  PCP:  [name], MD  Cardiologist: [name], MD   Chief Complaint  Patient presents with  . Coronary Artery Disease  . Cardiac Valve Problem    Severe AS    History of Present Illness:  Tim Campbell is a 82 y.o. male who has a history of coronary artery disease with prior bypass surgery, bypass graft with prior graft stent implantations, severe aortic stenosis, hyperlipidemia, hypertension, and combined systolic and diastolic heart failure with recent EF 45%.  The patient was have diagnostic heart catheterization performed proximally 3 months ago before he broke his left wrist. He is taking quite some time for the patient to recover from the surgical procedure required for that. In the meantime his family states that he has become quite sedentary and has almost no exertional tolerance or ambulatory ability. He used to walk around his backyard, but now he drinks up his writing a lot more and just about his yard on that. He has not had syncope. He has rare episodes of chest discomfort. His chest does hurt if he walks too far. He denies orthopnea and PND.    Past Medical History:  Diagnosis Date  . Aortic stenosis    severe by echo September 2014  . Arthritis    osteo knees "   . Chronic combined systolic and diastolic HF (heart failure), NYHA class 3 09/09/2013   LVEF 45%.   . Coronary artery disease    with occluded SVG to diagonal and BMS to prox SVG of RCA 10/2010. Recth 12/2012, 70% ISR SVG to RCA, 50% SVG to Cfx, 100% SVG to diagonal, and Patent LIMA with diffuse LAD dz.  . Diverticulosis   . Enlarged prostate    per patient  with mild effect on urine stream  . Heart murmur   . Hyperlipidemia   . Hypertension   . Hypothyroidism   . Insomnia   . Non Hodgkin's lymphoma ([place])   . Non-ST elevation MI (NSTEMI) ([place])   . RBBB   . Vitamin D deficiency      Past Surgical History:  Procedure Laterality Date  . ANKLE SURGERY    . CATARACT EXTRACTION W/ INTRAOCULAR LENS  IMPLANT, BILATERAL    . COLONOSCOPY    . CORONARY ARTERY BYPASS GRAFT  1993  . CORONARY STENT PLACEMENT  04/21/2014   bare metal SVG   DR Tamala Julian  . HEMORRHOID SURGERY    . I&D EXTREMITY Left 03/09/2016   Procedure: IRRIGATION AND DEBRIDEMENT WRIST;  Surgeon: Milly Jakob, MD;  Location: [facility];  Service: Orthopedics;  Laterality: Left;  . KNEE ARTHROSCOPY Right 1970'S   . LEFT AND RIGHT HEART CATHETERIZATION WITH CORONARY/GRAFT ANGIOGRAM N/A 04/05/2014   Procedure: LEFT AND RIGHT HEART CATHETERIZATION WITH [name];  Surgeon: Burnell Blanks, MD;  Location: [facility] CATH LAB;  Service: Cardiovascular;  Laterality: N/A;  . LEFT HEART CATHETERIZATION WITH CORONARY/GRAFT ANGIOGRAM N/A 01/02/2013   Procedure: LEFT HEART CATHETERIZATION WITH [name];  Surgeon: [name], MD;  Location: [facility] CATH LAB;  Service: Cardiovascular;  Laterality: N/A;  . OPEN REDUCTION INTERNAL FIXATION (ORIF) DISTAL RADIAL FRACTURE Left 02/24/2016   Procedure: OPEN TREATMENT OF LEFT DISTAL RADIUS FRACTURE;  Surgeon: Milly Jakob, MD;  Location: [facility];  Service: Orthopedics;  Laterality: Left;  . PERCUTANEOUS CORONARY STENT INTERVENTION (PCI-S) N/A 04/21/2014   Procedure:  PERCUTANEOUS CORONARY STENT INTERVENTION (PCI-S);  Surgeon: [name], MD;  Location: [facility] CATH LAB;  Service: Cardiovascular;  Laterality: N/A;  . Right inguinal hernia repair      Current Medications: Outpatient Medications Prior to Visit  Medication Sig Dispense Refill  . Ascorbic Acid (VITAMIN C) 1000 MG tablet Take 1,000 mg by mouth every other day.    Marland Kitchen aspirin EC 81 MG tablet Take 81 mg by mouth daily.    Marland Kitchen atorvastatin (LIPITOR) 40 MG tablet TAKE 1 TABLET (40 MG TOTAL) BY MOUTH DAILY AT 6 PM. 30 tablet 9  . B Complex-C (B-COMPLEX WITH VITAMIN C) tablet Take 1 tablet by mouth 3 (three)  times a week.    . CHLOROPHYLL PO Take 2 tablets by mouth daily.    . Coenzyme Q10 (CO Q 10 PO) Take 1 capsule by mouth daily.    . finasteride (PROSCAR) 5 MG tablet Take 5 mg by mouth daily.  11  . folic acid (FOLVITE) Q000111Q MCG tablet Take 800 mcg by mouth every other day.    . Garlic [id number] MG TABS Take 500 mg by mouth every other day.    . hydrochlorothiazide (MICROZIDE) 12.5 MG capsule TAKE 1 CAPSULE (12.5 MG TOTAL) BY MOUTH DAILY. 30 capsule 6  . HYDROcodone-acetaminophen (NORCO/VICODIN) 5-325 MG tablet Take 1 tablet by mouth every 4 (four) hours as needed for moderate pain or severe pain. 10 tablet 0  . levothyroxine (SYNTHROID, LEVOTHROID) 125 MCG tablet Take 125 mcg by mouth daily before breakfast.   3  . Magnesium 200 MG TABS Take 200 mg by mouth daily.    . metoprolol tartrate (LOPRESSOR) 25 MG tablet Take 25 mg by mouth daily.    . Multiple Vitamins-Iron (CHLORELLA PO) Take 4 tablets by mouth 2 (two) times daily. MED NAME: CHLORELLA    . NAT-RUL PSYLLIUM SEED HUSKS PO Take 610 mg by mouth daily.     . nitroGLYCERIN (NITROSTAT) 0.4 MG SL tablet Place 1 tablet (0.4 mg total) under the tongue every 5 (five) minutes as needed for chest pain. 25 tablet 11  . Potassium 99 MG TABS Take 99 mg by mouth every other day.     . SELENIUM PO Take 1 tablet by mouth every other day.    Marland Kitchen VITAMIN D, ERGOCALCIFEROL, PO Take 1,000 Units by mouth daily.     Marland Kitchen sulfamethoxazole-trimethoprim (BACTRIM DS,SEPTRA DS) 800-160 MG tablet Take 1 tablet by mouth 2 (two) times daily.    . isosorbide mononitrate (IMDUR) 60 MG 24 hr tablet Take 60 mg by mouth daily.    . tamsulosin (FLOMAX) 0.4 MG CAPS capsule Take 1 capsule (0.4 mg total) by mouth daily. 30 capsule 0   No facility-administered medications prior to visit.      Allergies:   Review of patient's allergies indicates no known allergies.   Social History   Social History  . Marital status: Married    Spouse name: N/A  . Number of children: 3  . Years  of education: N/A   Occupational History  . 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 Smoking status: Former Smoker    Packs/day: 0.50    Years: 15.00    Types: Cigarettes  . Smokeless tobacco: Never Used     Comment: quit smoking  in 1962   . Alcohol use No  . Drug use: No  . Sexual activity: Not Asked   Other Topics Concern  . None  Social History Narrative  . None     Family History:  The patient's family history includes Bone cancer in his brother; CAD in his brother; Colon cancer in his father; Dementia in his sister; Parkinson's disease in his brother; Stroke in his mother, sister, and sister.   ROS:   Please see the history of present illness.    The patient has refused TAVR for critical aortic stenosis. He complains of bilateral ankle swelling. He uses nitroglycerin rarely since he has become so sedentary.  All other systems reviewed and are negative.   PHYSICAL EXAM:   VS:  BP 110/62   Pulse (!) 51   Ht 5\' 10"  (1.778 m)   Wt 216 lb 3.2 oz (98.1 kg)   BMI 31.02 kg/m    GEN: Well nourished, well developed, in no acute distress  HEENT: normal  Neck: no JVD, carotid bruits, or masses Cardiac: RRR. There is a grade 4 to 5/6 crescendo decrescendo high-pitched systolic murmur compatible with aortic stenosis. No diastolic murmurs, rubs, or gallops. There is moderate bilateral ankle  edema  Respiratory:  clear to auscultation bilaterally, normal work of breathing GI: soft, nontender, nondistended, + BS MS: no deformity or atrophy  Skin: warm and dry, no rash Neuro:  Alert and Oriented x 3, Strength and sensation are intact Psych: euthymic mood, full affect  Wt Readings from Last 3 Encounters:  05/15/16 216 lb 3.2 oz (98.1 kg)  03/09/16 222 lb 3.2 oz (100.8 kg)  02/24/16 212 lb (96.2 kg)      Studies/Labs Reviewed:   EKG:  EKG  Not repeated  Recent Labs: 03/09/2016: BUN 10; Creatinine, Ser 1.05; Potassium 3.7; Sodium  139 03/12/2016: Hemoglobin 11.2; Platelets 251   Lipid Panel No results found for: CHOL, TRIG, HDL, CHOLHDL, VLDL, LDLCALC, LDLDIRECT  Additional studies/ records that were reviewed today include:  Please refer to most recent echo   ASSESSMENT:    1. Coronary artery disease involving coronary bypass graft with unspecified angina pectoris   2. Essential hypertension   3. Severe aortic stenosis   4. Hyperlipidemia      PLAN:  In order of problems listed above:  1. Based upon history the patient has become dramatically sedentary because of exertional angina and complaints of dyspnea with minimal activity. We will plan left and right heart catheterization to fully reevaluate the patient's coronary circulation and severity of aortic stenosis. If they are opportunities to intervene upon saphenous vein graft disease, we will do that. The patient does not want to have aortic valve replacement surgery orTAVR.  The patient was counseled to undergo left heart catheterization, coronary angiography, and possible percutaneous coronary intervention with stent implantation. The procedural risks and benefits were discussed in detail. The risks discussed included death, stroke, myocardial infarction, life-threatening bleeding, limb ischemia, kidney injury, allergy, and possible emergency cardiac surgery. The risk of these significant complications were estimated to occur less than 1% of the time. After discussion, the patient has agreed to proceed.   Medication Adjustments/Labs and Tests Ordered: Current medicines are reviewed at length with the patient today.  Concerns regarding medicines are outlined above.  Medication changes, Labs and Tests ordered today are listed in the Patient Instructions below. Patient Instructions  Medication Instructions:  Your physician recommends that you continue on your current medications as directed. Please refer to the Current Medication list given to you today.    Labwork: Bmet, Cbc, Pt/Inr  Testing/Procedures: Your physician has requested that you have a cardiac  catheterization. Cardiac catheterization is used to diagnose and/or treat various heart conditions. Doctors may recommend this procedure for a number of different reasons. The most common reason is to evaluate chest pain. Chest pain can be a symptom of coronary artery disease (CAD), and cardiac catheterization can show whether plaque is narrowing or blocking your heart's arteries. This procedure is also used to evaluate the valves, as well as measure the blood flow and oxygen levels in different parts of your heart. For further information please visit [email or web address]. Please follow instruction sheet, as given.    Follow-Up: Your physician recommends that you schedule a follow-up appointment pending procedure   Any Other Special Instructions Will Be Listed Below (If Applicable).     If you need a refill on your cardiac medications before your next appointment, please call your pharmacy.      Signed, [name], MD  05/15/2016 5:40 PM    [facility] [place], [place], [place]  [address] Phone: [phone]; Fax: [phone]

## 2016-05-16 DIAGNOSIS — S52532D Colles' fracture of left radius, subsequent encounter for closed fracture with routine healing: Secondary | ICD-10-CM | POA: Diagnosis not present

## 2016-05-16 LAB — BASIC METABOLIC PANEL
BUN: 15 mg/dL (ref 7–25)
CO2: 27 mmol/L (ref 20–31)
Calcium: 9.1 mg/dL (ref 8.6–10.3)
Chloride: 105 mmol/L (ref 98–110)
Creat: 1.06 mg/dL (ref 0.70–1.11)
GLUCOSE: 93 mg/dL (ref 65–99)
POTASSIUM: 4.2 mmol/L (ref 3.5–5.3)
Sodium: 141 mmol/L (ref 135–146)

## 2016-05-16 LAB — PROTIME-INR
INR: 1.1
Prothrombin Time: 11.3 s (ref 9.0–11.5)

## 2016-05-17 ENCOUNTER — Encounter (HOSPITAL_COMMUNITY)
Admission: RE | Disposition: A | Discharge status: Home / Self Care | Payer: Self-pay | Source: Ambulatory Visit | Attending: Interventional Cardiology

## 2016-05-17 ENCOUNTER — Ambulatory Visit (HOSPITAL_COMMUNITY)
Admission: RE | Admit: 2016-05-17 | Discharge: 2016-05-17 | Disposition: A | Discharge status: Home / Self Care | Payer: Medicare Other | Source: Ambulatory Visit | Attending: Interventional Cardiology | Admitting: Interventional Cardiology

## 2016-05-17 DIAGNOSIS — E039 Hypothyroidism, unspecified: Secondary | ICD-10-CM | POA: Diagnosis not present

## 2016-05-17 DIAGNOSIS — I209 Angina pectoris, unspecified: Secondary | ICD-10-CM

## 2016-05-17 DIAGNOSIS — Z7982 Long term (current) use of aspirin: Secondary | ICD-10-CM | POA: Insufficient documentation

## 2016-05-17 DIAGNOSIS — I5043 Acute on chronic combined systolic (congestive) and diastolic (congestive) heart failure: Secondary | ICD-10-CM | POA: Diagnosis present

## 2016-05-17 DIAGNOSIS — I350 Nonrheumatic aortic (valve) stenosis: Secondary | ICD-10-CM | POA: Diagnosis not present

## 2016-05-17 DIAGNOSIS — N400 Enlarged prostate without lower urinary tract symptoms: Secondary | ICD-10-CM | POA: Diagnosis not present

## 2016-05-17 DIAGNOSIS — I4510 Unspecified right bundle-branch block: Secondary | ICD-10-CM | POA: Diagnosis not present

## 2016-05-17 DIAGNOSIS — I25708 Atherosclerosis of coronary artery bypass graft(s), unspecified, with other forms of angina pectoris: Secondary | ICD-10-CM

## 2016-05-17 DIAGNOSIS — M1990 Unspecified osteoarthritis, unspecified site: Secondary | ICD-10-CM | POA: Insufficient documentation

## 2016-05-17 DIAGNOSIS — Z8249 Family history of ischemic heart disease and other diseases of the circulatory system: Secondary | ICD-10-CM | POA: Diagnosis not present

## 2016-05-17 DIAGNOSIS — I25719 Atherosclerosis of autologous vein coronary artery bypass graft(s) with unspecified angina pectoris: Secondary | ICD-10-CM | POA: Diagnosis not present

## 2016-05-17 DIAGNOSIS — I2582 Chronic total occlusion of coronary artery: Secondary | ICD-10-CM | POA: Insufficient documentation

## 2016-05-17 DIAGNOSIS — Z823 Family history of stroke: Secondary | ICD-10-CM | POA: Insufficient documentation

## 2016-05-17 DIAGNOSIS — I252 Old myocardial infarction: Secondary | ICD-10-CM | POA: Diagnosis not present

## 2016-05-17 DIAGNOSIS — I214 Non-ST elevation (NSTEMI) myocardial infarction: Secondary | ICD-10-CM | POA: Diagnosis present

## 2016-05-17 DIAGNOSIS — Z800 Family history of malignant neoplasm of digestive organs: Secondary | ICD-10-CM | POA: Insufficient documentation

## 2016-05-17 DIAGNOSIS — Z955 Presence of coronary angioplasty implant and graft: Secondary | ICD-10-CM | POA: Diagnosis not present

## 2016-05-17 DIAGNOSIS — I10 Essential (primary) hypertension: Secondary | ICD-10-CM | POA: Diagnosis present

## 2016-05-17 DIAGNOSIS — Z8572 Personal history of non-Hodgkin lymphomas: Secondary | ICD-10-CM | POA: Insufficient documentation

## 2016-05-17 DIAGNOSIS — E785 Hyperlipidemia, unspecified: Secondary | ICD-10-CM | POA: Diagnosis not present

## 2016-05-17 DIAGNOSIS — Z87891 Personal history of nicotine dependence: Secondary | ICD-10-CM | POA: Insufficient documentation

## 2016-05-17 DIAGNOSIS — Z808 Family history of malignant neoplasm of other organs or systems: Secondary | ICD-10-CM | POA: Diagnosis not present

## 2016-05-17 DIAGNOSIS — I2584 Coronary atherosclerosis due to calcified coronary lesion: Secondary | ICD-10-CM | POA: Insufficient documentation

## 2016-05-17 DIAGNOSIS — E559 Vitamin D deficiency, unspecified: Secondary | ICD-10-CM | POA: Diagnosis not present

## 2016-05-17 DIAGNOSIS — I110 Hypertensive heart disease with heart failure: Secondary | ICD-10-CM | POA: Insufficient documentation

## 2016-05-17 DIAGNOSIS — I5042 Chronic combined systolic (congestive) and diastolic (congestive) heart failure: Secondary | ICD-10-CM

## 2016-05-17 DIAGNOSIS — I25119 Atherosclerotic heart disease of native coronary artery with unspecified angina pectoris: Secondary | ICD-10-CM | POA: Diagnosis present

## 2016-05-17 HISTORY — PX: CARDIAC CATHETERIZATION: SHX172

## 2016-05-17 LAB — POCT I-STAT 3, ART BLOOD GAS (G3+)
Acid-Base Excess: 2 mmol/L (ref 0.0–2.0)
BICARBONATE: 25.9 meq/L — AB (ref 20.0–24.0)
O2 Saturation: 94 %
PH ART: 7.43 (ref 7.350–7.450)
TCO2: 27 mmol/L (ref 0–100)
pCO2 arterial: 39 mmHg (ref 35.0–45.0)
pO2, Arterial: 70 mmHg — ABNORMAL LOW (ref 80.0–100.0)

## 2016-05-17 LAB — POCT I-STAT 3, VENOUS BLOOD GAS (G3P V)
Acid-Base Excess: 3 mmol/L — ABNORMAL HIGH (ref 0.0–2.0)
Acid-Base Excess: 3 mmol/L — ABNORMAL HIGH (ref 0.0–2.0)
Bicarbonate: 28 mEq/L — ABNORMAL HIGH (ref 20.0–24.0)
Bicarbonate: 28.4 mEq/L — ABNORMAL HIGH (ref 20.0–24.0)
O2 SAT: 64 %
O2 Saturation: 66 %
PCO2 VEN: 43.7 mmHg — AB (ref 45.0–50.0)
PCO2 VEN: 44.7 mmHg — AB (ref 45.0–50.0)
TCO2: 29 mmol/L (ref 0–100)
TCO2: 30 mmol/L (ref 0–100)
pH, Ven: 7.411 — ABNORMAL HIGH (ref 7.250–7.300)
pH, Ven: 7.415 — ABNORMAL HIGH (ref 7.250–7.300)
pO2, Ven: 33 mmHg (ref 31.0–45.0)
pO2, Ven: 34 mmHg (ref 31.0–45.0)

## 2016-05-17 LAB — POCT ACTIVATED CLOTTING TIME: ACTIVATED CLOTTING TIME: 136 s

## 2016-05-17 SURGERY — RIGHT/LEFT HEART CATH AND CORONARY/GRAFT ANGIOGRAPHY

## 2016-05-17 MED ORDER — HEPARIN (PORCINE) IN NACL 2-0.9 UNIT/ML-% IJ SOLN
INTRAMUSCULAR | Status: DC | PRN
  Administered 2016-05-17: 1500 mL via INTRA_ARTERIAL

## 2016-05-17 MED ORDER — VERAPAMIL HCL 2.5 MG/ML IV SOLN
INTRAVENOUS | Status: AC
  Filled 2016-05-17: qty 2

## 2016-05-17 MED ORDER — SODIUM CHLORIDE 0.9% FLUSH: 3.0000 mL | INTRAVENOUS | Status: DC | PRN

## 2016-05-17 MED ORDER — LIDOCAINE HCL (PF) 1 % IJ SOLN
INTRAMUSCULAR | Status: AC
  Filled 2016-05-17: qty 30

## 2016-05-17 MED ORDER — SODIUM CHLORIDE 0.9 % IV SOLN: 250.0000 mL | INTRAVENOUS | Status: DC | PRN

## 2016-05-17 MED ORDER — SODIUM CHLORIDE 0.9 % IV SOLN: INTRAVENOUS | Status: AC

## 2016-05-17 MED ORDER — SODIUM CHLORIDE 0.9% FLUSH: 3.0000 mL | Freq: Two times a day (BID) | INTRAVENOUS | Status: DC

## 2016-05-17 MED ORDER — LIDOCAINE HCL (PF) 1 % IJ SOLN
INTRAMUSCULAR | Status: DC | PRN
  Administered 2016-05-17: 16 mL via INTRADERMAL

## 2016-05-17 MED ORDER — SODIUM CHLORIDE 0.9 % IV SOLN
INTRAVENOUS | Status: DC
  Administered 2016-05-17: 11:00:00 via INTRAVENOUS

## 2016-05-17 MED ORDER — HEPARIN (PORCINE) IN NACL 2-0.9 UNIT/ML-% IJ SOLN
INTRAMUSCULAR | Status: AC
  Filled 2016-05-17: qty 1500

## 2016-05-17 MED ORDER — IOPAMIDOL (ISOVUE-370) INJECTION 76%
INTRAVENOUS | Status: DC | PRN
  Administered 2016-05-17: 90 mL via INTRA_ARTERIAL

## 2016-05-17 MED ORDER — HEPARIN SODIUM (PORCINE) 1000 UNIT/ML IJ SOLN
INTRAMUSCULAR | Status: AC
  Filled 2016-05-17: qty 1

## 2016-05-17 MED ORDER — HEPARIN SODIUM (PORCINE) 1000 UNIT/ML IJ SOLN
INTRAMUSCULAR | Status: DC | PRN
  Administered 2016-05-17: 2000 [IU] via INTRAVENOUS

## 2016-05-17 MED ORDER — ASPIRIN 81 MG PO CHEW: 81.0000 mg | CHEWABLE_TABLET | ORAL | Status: DC

## 2016-05-17 SURGICAL SUPPLY — 14 items
CATH INFINITI 5 FR IM (CATHETERS) ×3 IMPLANT
CATH INFINITI 5FR MULTPACK ANG (CATHETERS) ×3 IMPLANT
CATH SWAN GANZ 7F STRAIGHT (CATHETERS) ×3 IMPLANT
KIT HEART LEFT (KITS) ×3 IMPLANT
PACK CARDIAC CATHETERIZATION (CUSTOM PROCEDURE TRAY) ×3 IMPLANT
SHEATH PINNACLE 5F 10CM (SHEATH) ×3 IMPLANT
SHEATH PINNACLE 7F 10CM (SHEATH) ×3 IMPLANT
TRANSDUCER W/STOPCOCK (MISCELLANEOUS) ×6 IMPLANT
TUBING ART PRESS 72  MALE/FEM (TUBING) ×2
TUBING ART PRESS 72 MALE/FEM (TUBING) ×1 IMPLANT
TUBING CIL FLEX 10 FLL-RA (TUBING) ×3 IMPLANT
WIRE EMERALD 3MM-J .025X260CM (WIRE) ×3 IMPLANT
WIRE EMERALD 3MM-J .035X150CM (WIRE) ×3 IMPLANT
WIRE EMERALD ST .035X150CM (WIRE) ×3 IMPLANT

## 2016-05-17 NOTE — H&P (View-Only) (Signed)
Cardiology Office Note    Date:  05/15/2016   ID:  Tim Campbell, DOB [birth year], MRN [id number]  PCP:  [name], MD  Cardiologist: [name], MD   Chief Complaint  Patient presents with  . Coronary Artery Disease  . Cardiac Valve Problem    Severe AS    History of Present Illness:  Tim Campbell is a 82 y.o. male who has a history of coronary artery disease with prior bypass surgery, bypass graft with prior graft stent implantations, severe aortic stenosis, hyperlipidemia, hypertension, and combined systolic and diastolic heart failure with recent EF 45%.  The patient was have diagnostic heart catheterization performed proximally 3 months ago before he broke his left wrist. He is taking quite some time for the patient to recover from the surgical procedure required for that. In the meantime his family states that he has become quite sedentary and has almost no exertional tolerance or ambulatory ability. He used to walk around his backyard, but now he drinks up his writing a lot more and just about his yard on that. He has not had syncope. He has rare episodes of chest discomfort. His chest does hurt if he walks too far. He denies orthopnea and PND.    Past Medical History:  Diagnosis Date  . Aortic stenosis    severe by echo September 2014  . Arthritis    osteo knees "   . Chronic combined systolic and diastolic HF (heart failure), NYHA class 3 09/09/2013   LVEF 45%.   . Coronary artery disease    with occluded SVG to diagonal and BMS to prox SVG of RCA 10/2010. Recth 12/2012, 70% ISR SVG to RCA, 50% SVG to Cfx, 100% SVG to diagonal, and Patent LIMA with diffuse LAD dz.  . Diverticulosis   . Enlarged prostate    per patient  with mild effect on urine stream  . Heart murmur   . Hyperlipidemia   . Hypertension   . Hypothyroidism   . Insomnia   . Non Hodgkin's lymphoma ([place])   . Non-ST elevation MI (NSTEMI) ([place])   . RBBB   . Vitamin D deficiency      Past Surgical History:  Procedure Laterality Date  . ANKLE SURGERY    . CATARACT EXTRACTION W/ INTRAOCULAR LENS  IMPLANT, BILATERAL    . COLONOSCOPY    . CORONARY ARTERY BYPASS GRAFT  1993  . CORONARY STENT PLACEMENT  04/21/2014   bare metal SVG   DR Tamala Julian  . HEMORRHOID SURGERY    . I&D EXTREMITY Left 03/09/2016   Procedure: IRRIGATION AND DEBRIDEMENT WRIST;  Surgeon: Milly Jakob, MD;  Location: [facility];  Service: Orthopedics;  Laterality: Left;  . KNEE ARTHROSCOPY Right 1970'S   . LEFT AND RIGHT HEART CATHETERIZATION WITH CORONARY/GRAFT ANGIOGRAM N/A 04/05/2014   Procedure: LEFT AND RIGHT HEART CATHETERIZATION WITH [name];  Surgeon: Burnell Blanks, MD;  Location: [facility] CATH LAB;  Service: Cardiovascular;  Laterality: N/A;  . LEFT HEART CATHETERIZATION WITH CORONARY/GRAFT ANGIOGRAM N/A 01/02/2013   Procedure: LEFT HEART CATHETERIZATION WITH [name];  Surgeon: [name], MD;  Location: [facility] CATH LAB;  Service: Cardiovascular;  Laterality: N/A;  . OPEN REDUCTION INTERNAL FIXATION (ORIF) DISTAL RADIAL FRACTURE Left 02/24/2016   Procedure: OPEN TREATMENT OF LEFT DISTAL RADIUS FRACTURE;  Surgeon: Milly Jakob, MD;  Location: [facility];  Service: Orthopedics;  Laterality: Left;  . PERCUTANEOUS CORONARY STENT INTERVENTION (PCI-S) N/A 04/21/2014   Procedure:  PERCUTANEOUS CORONARY STENT INTERVENTION (PCI-S);  Surgeon: [name], MD;  Location[facility] CATH LAB;  Service: Cardiovascular;  Laterality: N/A;  . Right inguinal hernia repair      Current Medications: Outpatient Medications Prior to Visit  Medication Sig Dispense Refill  . Ascorbic Acid (VITAMIN C) 1000 MG tablet Take 1,000 mg by mouth every other day.    Marland Kitchen aspirin EC 81 MG tablet Take 81 mg by mouth daily.    Marland Kitchen atorvastatin (LIPITOR) 40 MG tablet TAKE 1 TABLET (40 MG TOTAL) BY MOUTH DAILY AT 6 PM. 30 tablet 9  . B Complex-C (B-COMPLEX WITH VITAMIN C) tablet Take 1 tablet by mouth 3 (three)  times a week.    . CHLOROPHYLL PO Take 2 tablets by mouth daily.    . Coenzyme Q10 (CO Q 10 PO) Take 1 capsule by mouth daily.    . finasteride (PROSCAR) 5 MG tablet Take 5 mg by mouth daily.  11  . folic acid (FOLVITE) Q000111Q MCG tablet Take 800 mcg by mouth every other day.    . Garlic [id number] MG TABS Take 500 mg by mouth every other day.    . hydrochlorothiazide (MICROZIDE) 12.5 MG capsule TAKE 1 CAPSULE (12.5 MG TOTAL) BY MOUTH DAILY. 30 capsule 6  . HYDROcodone-acetaminophen (NORCO/VICODIN) 5-325 MG tablet Take 1 tablet by mouth every 4 (four) hours as needed for moderate pain or severe pain. 10 tablet 0  . levothyroxine (SYNTHROID, LEVOTHROID) 125 MCG tablet Take 125 mcg by mouth daily before breakfast.   3  . Magnesium 200 MG TABS Take 200 mg by mouth daily.    . metoprolol tartrate (LOPRESSOR) 25 MG tablet Take 25 mg by mouth daily.    . Multiple Vitamins-Iron (CHLORELLA PO) Take 4 tablets by mouth 2 (two) times daily. MED NAME: CHLORELLA    . NAT-RUL PSYLLIUM SEED HUSKS PO Take 610 mg by mouth daily.     . nitroGLYCERIN (NITROSTAT) 0.4 MG SL tablet Place 1 tablet (0.4 mg total) under the tongue every 5 (five) minutes as needed for chest pain. 25 tablet 11  . Potassium 99 MG TABS Take 99 mg by mouth every other day.     . SELENIUM PO Take 1 tablet by mouth every other day.    Marland Kitchen VITAMIN D, ERGOCALCIFEROL, PO Take 1,000 Units by mouth daily.     Marland Kitchen sulfamethoxazole-trimethoprim (BACTRIM DS,SEPTRA DS) 800-160 MG tablet Take 1 tablet by mouth 2 (two) times daily.    . isosorbide mononitrate (IMDUR) 60 MG 24 hr tablet Take 60 mg by mouth daily.    . tamsulosin (FLOMAX) 0.4 MG CAPS capsule Take 1 capsule (0.4 mg total) by mouth daily. 30 capsule 0   No facility-administered medications prior to visit.      Allergies:   Review of patient's allergies indicates no known allergies.   Social History   Social History  . Marital status: Married    Spouse name: N/A  . Number of children: 3  . Years  of education: N/A   Occupational History  . 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 Smoking status: Former Smoker    Packs/day: 0.50    Years: 15.00    Types: Cigarettes  . Smokeless tobacco: Never Used     Comment: quit smoking  in 1962   . Alcohol use No  . Drug use: No  . Sexual activity: Not Asked   Other Topics Concern  . None  Social History Narrative  . None     Family History:  The patient's family history includes Bone cancer in his brother; CAD in his brother; Colon cancer in his father; Dementia in his sister; Parkinson's disease in his brother; Stroke in his mother, sister, and sister.   ROS:   Please see the history of present illness.    The patient has refused TAVR for critical aortic stenosis. He complains of bilateral ankle swelling. He uses nitroglycerin rarely since he has become so sedentary.  All other systems reviewed and are negative.   PHYSICAL EXAM:   VS:  BP 110/62   Pulse (!) 51   Ht 5\' 10"  (1.778 m)   Wt 216 lb 3.2 oz (98.1 kg)   BMI 31.02 kg/m    GEN: Well nourished, well developed, in no acute distress  HEENT: normal  Neck: no JVD, carotid bruits, or masses Cardiac: RRR. There is a grade 4 to 5/6 crescendo decrescendo high-pitched systolic murmur compatible with aortic stenosis. No diastolic murmurs, rubs, or gallops. There is moderate bilateral ankle  edema  Respiratory:  clear to auscultation bilaterally, normal work of breathing GI: soft, nontender, nondistended, + BS MS: no deformity or atrophy  Skin: warm and dry, no rash Neuro:  Alert and Oriented x 3, Strength and sensation are intact Psych: euthymic mood, full affect  Wt Readings from Last 3 Encounters:  05/15/16 216 lb 3.2 oz (98.1 kg)  03/09/16 222 lb 3.2 oz (100.8 kg)  02/24/16 212 lb (96.2 kg)      Studies/Labs Reviewed:   EKG:  EKG  Not repeated  Recent Labs: 03/09/2016: BUN 10; Creatinine, Ser 1.05; Potassium 3.7; Sodium  139 03/12/2016: Hemoglobin 11.2; Platelets 251   Lipid Panel No results found for: CHOL, TRIG, HDL, CHOLHDL, VLDL, LDLCALC, LDLDIRECT  Additional studies/ records that were reviewed today include:  Please refer to most recent echo   ASSESSMENT:    1. Coronary artery disease involving coronary bypass graft with unspecified angina pectoris   2. Essential hypertension   3. Severe aortic stenosis   4. Hyperlipidemia      PLAN:  In order of problems listed above:  1. Based upon history the patient has become dramatically sedentary because of exertional angina and complaints of dyspnea with minimal activity. We will plan left and right heart catheterization to fully reevaluate the patient's coronary circulation and severity of aortic stenosis. If they are opportunities to intervene upon saphenous vein graft disease, we will do that. The patient does not want to have aortic valve replacement surgery orTAVR.  The patient was counseled to undergo left heart catheterization, coronary angiography, and possible percutaneous coronary intervention with stent implantation. The procedural risks and benefits were discussed in detail. The risks discussed included death, stroke, myocardial infarction, life-threatening bleeding, limb ischemia, kidney injury, allergy, and possible emergency cardiac surgery. The risk of these significant complications were estimated to occur less than 1% of the time. After discussion, the patient has agreed to proceed.   Medication Adjustments/Labs and Tests Ordered: Current medicines are reviewed at length with the patient today.  Concerns regarding medicines are outlined above.  Medication changes, Labs and Tests ordered today are listed in the Patient Instructions below. Patient Instructions  Medication Instructions:  Your physician recommends that you continue on your current medications as directed. Please refer to the Current Medication list given to you today.    Labwork: Bmet, Cbc, Pt/Inr  Testing/Procedures: Your physician has requested that you have a cardiac  catheterization. Cardiac catheterization is used to diagnose and/or treat various heart conditions. Doctors may recommend this procedure for a number of different reasons. The most common reason is to evaluate chest pain. Chest pain can be a symptom of coronary artery disease (CAD), and cardiac catheterization can show whether plaque is narrowing or blocking your heart's arteries. This procedure is also used to evaluate the valves, as well as measure the blood flow and oxygen levels in different parts of your heart. For further information please visit HugeFiesta.[email or web address]. Please follow instruction sheet, as given.    Follow-Up: Your physician recommends that you schedule a follow-up appointment pending procedure   Any Other Special Instructions Will Be Listed Below (If Applicable).     If you need a refill on your cardiac medications before your next appointment, please call your pharmacy.      Signed, [name], MD  05/15/2016 5:40 PM    [facility] [place], [place], [place]  [address] Phone: [phone]; Fax: [phone]

## 2016-05-17 NOTE — Progress Notes (Signed)
84F sheath pulled from R femoral artery and 39F sheath pulled from R femoral vein by Tessie Eke. 20 minutes manual pressure applied. Site level 0 with no hematoma noted. Site dressed with tegaderm and 4x4. Instructions given to pt. Bedrest begins at 1620. Pedal pulses unable to be palpated and dopplered before and after with no change.

## 2016-05-17 NOTE — Discharge Instructions (Signed)
Angiogram, Care After Refer to this sheet in the next few weeks. These instructions provide you with information about caring for yourself after your procedure. Your health care provider may also give you more specific instructions. Your treatment has been planned according to current medical practices, but problems sometimes occur. Call your health care provider if you have any problems or questions after your procedure. WHAT TO EXPECT AFTER THE PROCEDURE After your procedure, it is typical to have the following:  Bruising at the catheter insertion site that usually fades within 1-2 weeks.  Blood collecting in the tissue (hematoma) that may be painful to the touch. It should usually decrease in size and tenderness within 1-2 weeks. HOME CARE INSTRUCTIONS  Take medicines only as directed by your health care provider.  You may shower 24-48 hours after the procedure or as directed by your health care provider. Remove the bandage (dressing) and gently wash the site with plain soap and water. Pat the area dry with a clean towel. Do not rub the site, because this may cause bleeding.  Do not take baths, swim, or use a hot tub until your health care provider approves.  Check your insertion site every day for redness, swelling, or drainage.  Do not apply powder or lotion to the site.  Do not lift over 10 lb (4.5 kg) for 5 days after your procedure or as directed by your health care provider.  Ask your health care provider when it is okay to:  Return to work or school.  Resume usual physical activities or sports.  Resume sexual activity.  Do not drive home if you are discharged the same day as the procedure. Have someone else drive you.  You may drive 24 hours after the procedure unless otherwise instructed by your health care provider.  Do not operate machinery or power tools for 24 hours after the procedure or as directed by your health care provider.  If your procedure was done as an  outpatient procedure, which means that you went home the same day as your procedure, a responsible adult should be with you for the first 24 hours after you arrive home.  Keep all follow-up visits as directed by your health care provider. This is important. SEEK MEDICAL CARE IF:  You have a fever.  You have chills.  You have increased bleeding from the catheter insertion site. Hold pressure on the site. Call [place] IF:  You have unusual pain at the catheter insertion site.  You have redness, warmth, or swelling at the catheter insertion site.  You have drainage (other than a small amount of blood on the dressing) from the catheter insertion site.  The catheter insertion site is bleeding, and the bleeding does not stop after 30 minutes of holding steady pressure on the site.  The area near or just beyond the catheter insertion site becomes pale, cool, tingly, or numb.   This information is not intended to replace advice given to you by your health care provider. Make sure you discuss any questions you have with your health care provider.   Document Released: 04/12/2005 Document Revised: 10/15/2014 Document Reviewed: 02/25/2013 Elsevier Interactive Patient Education [employer].

## 2016-05-17 NOTE — Interval H&P Note (Signed)
Cath Lab Visit (complete for each Cath Lab visit)  Clinical Evaluation Leading to the Procedure:   ACS: No.  Non-ACS:    Anginal Classification: CCS Campbell  Anti-ischemic medical therapy: Maximal Therapy (2 or more classes of medications)  Non-Invasive Test Results: No non-invasive testing performed  Prior CABG: Previous CABG      History and Physical Interval Note:  05/17/2016 1:45 PM  Tim Campbell Tim Campbell  has presented today for surgery, with the diagnosis of arotic stenosis  The various methods of treatment have been discussed with the patient and family. After consideration of risks, benefits and other options for treatment, the patient has consented to  Procedure(s): Right/Left Heart Cath and Coronary/Graft Angiography (N/A) as a surgical intervention .  The patient's history has been reviewed, patient examined, no change in status, stable for surgery.  I have reviewed the patient's chart and labs.  Questions were answered to the patient's satisfaction.     Tim Campbell

## 2016-05-18 ENCOUNTER — Encounter (HOSPITAL_COMMUNITY): Payer: Self-pay | Admitting: Interventional Cardiology

## 2016-05-24 ENCOUNTER — Encounter (HOSPITAL_COMMUNITY): Payer: Self-pay | Admitting: Internal Medicine

## 2016-05-28 ENCOUNTER — Telehealth: Payer: Self-pay | Admitting: Interventional Cardiology

## 2016-05-28 NOTE — Telephone Encounter (Signed)
LMTCB for pt, do not see DPR on file for daughter.  Pt can be scheduled to see Dr Burt Knack or Dr Angelena Form for TAVR evaluation.

## 2016-05-28 NOTE — Telephone Encounter (Signed)
I spoke with Tim Campbell, Tim Campbell advised I have scheduled appt for him with Dr Sherren Mocha 05/29/16 for TAVR evaluation.  I will forward to Dr Tamala Julian for review.

## 2016-05-28 NOTE — Telephone Encounter (Signed)
Jackelyn Poling is calling because her father has decided to with the Valve Procedure and they are wanting to know the next steps . Please call   Thanks

## 2016-05-28 NOTE — Telephone Encounter (Signed)
He should be scheduled with Dr. Angelena Form, who saw him before unless there is some issue

## 2016-05-29 ENCOUNTER — Ambulatory Visit (INDEPENDENT_AMBULATORY_CARE_PROVIDER_SITE_OTHER): Payer: Medicare Other | Admitting: Cardiovascular Disease

## 2016-05-29 ENCOUNTER — Encounter: Payer: Self-pay | Admitting: Cardiovascular Disease

## 2016-05-29 ENCOUNTER — Institutional Professional Consult (permissible substitution): Payer: Medicare Other | Admitting: Cardiovascular Disease

## 2016-05-29 ENCOUNTER — Other Ambulatory Visit: Payer: Self-pay | Admitting: *Deleted

## 2016-05-29 VITALS — BP 110/50 | HR 56 | Ht 70.0 in | Wt 213.0 lb

## 2016-05-29 DIAGNOSIS — I350 Nonrheumatic aortic (valve) stenosis: Secondary | ICD-10-CM

## 2016-05-29 DIAGNOSIS — I25810 Atherosclerosis of coronary artery bypass graft(s) without angina pectoris: Secondary | ICD-10-CM

## 2016-05-29 DIAGNOSIS — I25709 Atherosclerosis of coronary artery bypass graft(s), unspecified, with unspecified angina pectoris: Secondary | ICD-10-CM | POA: Diagnosis not present

## 2016-05-29 NOTE — Telephone Encounter (Signed)
Pt scheduled with Dr Angelena Form 05/29/16.

## 2016-05-29 NOTE — Patient Instructions (Signed)
Medication Instructions:  Your physician recommends that you continue on your current medications as directed. Please refer to the Current Medication list given to you today.   Labwork: none  Testing/Procedures: Your physician has requested that you have an echocardiogram. Echocardiography is a painless test that uses sound waves to create images of your heart. It provides your doctor with information about the size and shape of your heart and how well your heart's chambers and valves are working. This procedure takes approximately one hour. There are no restrictions for this procedure.    Follow-Up: We will contact the surgeon's office and they will be in touch with you to arrange an appointment.   Any Other Special Instructions Will Be Listed Below (If Applicable).     If you need a refill on your cardiac medications before your next appointment, please call your pharmacy.

## 2016-05-29 NOTE — Progress Notes (Signed)
Tim Campbell  Chief Complaint  Patient presents with  . Aortic Stenosis    consult for Dr.Smith     History of Present Illness: 82 yo male with history of CAD s/p CABG, HTN, Hyperlipidemia, hypothyroidism, large cell non-Hodgkins lymphoma and severe aortic valve stenosis here today for evaluation for TAVR. He underwent 5V CABG in 1993. He had a NSTEMI in 2005 and was found to have occlusion of the vein graft to the Diagonal. He has since had a bare metal stent placed in the vein graft to the PDA in January 2012. Grafts to the LAD and OM were patent at that time. I met him for TAVR evaluation June 2015 and we planned a cardiac cath to define his CAD. This cath on 04/05/14 showed severe disease in the body of the SVG to OM and moderate restenosis in the stent in the body of SVG to PDA. A bare metal stent was placed in the body of the SVG to OM. TAVR was delayed given his improvement in symptoms with PCI of the vein graft. His last echo in September 2016 showed moderately severe AS with a peak velocity of 3.92 m/s, a mean aortic valve gradient of 33 mm Hg, and an AVA of 1.03 cm2. There was mild AI and mild MR with an LVEF of 45-50%.   He has been followed in our office by Dr. Pernell Dupre. Recently with c/o exertional chest pain and dyspnea. Cardiac cath 05/17/16 per Dr. Tamala Julian with overall stable CAD and grafts.   He is referred back to the valve clinic today to discuss his aortic stenosis. He tells me today that he has been having chest pressure and shortness of breath with mild activity. The chest pressure is most worrisome to him. He has dyspnea with minimal exertion and constant fatigue. He fell and broke his wrist April 2017 and had 2 surgeries on his wrist. He denies syncope. He tripped and fell. He has some ankle edema.    Primary Care Physician: [name], MD   Past Medical  History:  Diagnosis Date  . Aortic stenosis    severe by echo September 2014  . Arthritis    osteo knees "   . Chronic combined systolic and diastolic HF (heart failure), NYHA class 3 09/09/2013   LVEF 45%.   . Coronary artery disease    with occluded SVG to diagonal and BMS to prox SVG of RCA 10/2010. Recth 12/2012, 70% ISR SVG to RCA, 50% SVG to Cfx, 100% SVG to diagonal, and Patent LIMA with diffuse LAD dz.  . Diverticulosis   . Enlarged prostate    per patient  with mild effect on urine stream  . Heart murmur   . Hyperlipidemia   . Hypertension   . Hypothyroidism   . Insomnia   . Non Hodgkin's lymphoma ([place])   . Non-ST elevation MI (NSTEMI) ([place])   . RBBB   . Vitamin D deficiency     Past  Surgical History:  Procedure Laterality Date  . ANKLE SURGERY    . CARDIAC CATHETERIZATION N/A 05/17/2016   Procedure: Right/Left Heart Cath and Coronary/Graft Angiography;  Surgeon: Nelva Bush, MD;  Location: [facility];  Service: Cardiovascular;  Laterality: N/A;  . CATARACT EXTRACTION W/ INTRAOCULAR LENS  IMPLANT, BILATERAL    . COLONOSCOPY    . CORONARY ARTERY BYPASS GRAFT  1993  . CORONARY STENT PLACEMENT  04/21/2014   bare metal SVG   DR Tamala Julian  . HEMORRHOID SURGERY    . I&D EXTREMITY Left 03/09/2016   Procedure: IRRIGATION AND DEBRIDEMENT WRIST;  Surgeon: Milly Jakob, MD;  Location: [facility];  Service: Orthopedics;  Laterality: Left;  . KNEE ARTHROSCOPY Right 1970'S   . LEFT AND RIGHT HEART CATHETERIZATION WITH CORONARY/GRAFT ANGIOGRAM N/A 04/05/2014   Procedure: LEFT AND RIGHT HEART CATHETERIZATION WITH [name];  Surgeon: Burnell Blanks, MD;  Location[facility] CATH LAB;  Service: Cardiovascular;  Laterality: N/A;  . LEFT HEART CATHETERIZATION WITH CORONARY/GRAFT ANGIOGRAM N/A 01/02/2013   Procedure: LEFT HEART CATHETERIZATION WITH [name];  Surgeon: [name], MD;  Location: [facility] CATH LAB;  Service: Cardiovascular;  Laterality: N/A;    . OPEN REDUCTION INTERNAL FIXATION (ORIF) DISTAL RADIAL FRACTURE Left 02/24/2016   Procedure: OPEN TREATMENT OF LEFT DISTAL RADIUS FRACTURE;  Surgeon: Milly Jakob, MD;  Location: [facility];  Service: Orthopedics;  Laterality: Left;  . PERCUTANEOUS CORONARY STENT INTERVENTION (PCI-S) N/A 04/21/2014   Procedure: PERCUTANEOUS CORONARY STENT INTERVENTION (PCI-S);  Surgeon: [name], MD;  Location[facility] CATH LAB;  Service: Cardiovascular;  Laterality: N/A;  . Right inguinal hernia repair      Current Outpatient Prescriptions  Medication Sig Dispense Refill  . Ascorbic Acid (VITAMIN C) 1000 MG tablet Take 1,000 mg by mouth every other day.    Marland Kitchen aspirin EC 81 MG tablet Take 81 mg by mouth daily.    Marland Kitchen atorvastatin (LIPITOR) 40 MG tablet Take 40 mg by mouth daily.    . B Complex-C (B-COMPLEX WITH VITAMIN C) tablet Take 1 tablet by mouth 3 (three) times a week.    . CHLOROPHYLL PO Take 2 tablets by mouth daily.    . finasteride (PROSCAR) 5 MG tablet Take 5 mg by mouth daily.  11  . folic acid (FOLVITE) 185 MCG tablet Take 800 mcg by mouth every other day.    . hydrochlorothiazide (MICROZIDE) 12.5 MG capsule TAKE 1 CAPSULE (12.5 MG TOTAL) BY MOUTH DAILY. 30 capsule 6  . isosorbide mononitrate (IMDUR) 60 MG 24 hr tablet Take one and a half (1.5) tablet (90 mg total) by mouth daily.    Marland Kitchen levothyroxine (SYNTHROID, LEVOTHROID) 125 MCG tablet Take 125 mcg by mouth daily before breakfast.   3  . Magnesium 200 MG TABS Take 200 mg by mouth daily.    . metoprolol tartrate (LOPRESSOR) 25 MG tablet Take 25 mg by mouth daily.    . Multiple Vitamins-Iron (CHLORELLA PO) Take 4 tablets by mouth 2 (two) times daily. MED NAME: CHLORELLA    . NAT-RUL PSYLLIUM SEED HUSKS PO Take 610 mg by mouth daily.     . nitroGLYCERIN (NITROSTAT) 0.4 MG SL tablet Place 1 tablet (0.4 mg total) under the tongue every 5 (five) minutes as needed for chest pain. 25 tablet 11  . Potassium 99 MG TABS Take 99 mg by mouth every other day.      . SELENIUM PO Take 1 tablet by mouth every other day.    Marland Kitchen  tamsulosin (FLOMAX) 0.4 MG CAPS capsule Take 0.4 mg by mouth 2 (two) times daily.  11  . VITAMIN D, ERGOCALCIFEROL, PO Take 1,000 Units by mouth daily.      No current facility-administered medications for this visit.     No Known Allergies  Social History   Social History  . Marital status: Married    Spouse name: N/A  . Number of children: 3  . Years of education: N/A   Occupational History  . 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 Smoking status: Former Smoker    Packs/day: 0.50    Years: 15.00    Types: Cigarettes  . Smokeless tobacco: Never Used     Comment: quit smoking  in 1962   . Alcohol use No  . Drug use: No  . Sexual activity: Not on file   Other Topics Concern  . Not on file   Social History Narrative  . No narrative on file    Family History  Problem Relation Age of Onset  . Colon cancer Father   . Stroke Mother   . CAD Brother   . Stroke Sister   . Bone cancer Brother   . Parkinson's disease Brother   . Dementia Sister   . Stroke Sister     Review of Systems:  As stated in the HPI and otherwise negative.   BP (!) 110/50   Pulse (!) 56   Ht 5' 10"  (1.778 m)   Wt 213 lb (96.6 kg)   BMI 30.56 kg/m   Physical Examination: General: Well developed, well nourished, NAD  HEENT: OP clear, mucus membranes moist  SKIN: warm, dry. No rashes. Neuro: No focal deficits  Musculoskeletal: Muscle strength 5/5 all ext  Psychiatric: Mood and affect normal  Neck: No JVD, no carotid bruits, no thyromegaly, no lymphadenopathy.  Lungs:Clear bilaterally, no wheezes, rhonci, crackles Cardiovascular: Regular rate and rhythm. Harsh systolic murmur. No gallops or rubs. Abdomen:Soft. Bowel sounds present. Non-tender.  Extremities: Trace  bilateral lower extremity edema. Pulses are 1 + in the bilateral DP/PT.  EKG:  EKG is not ordered today. The ekg ordered  today demonstrates   Recent Labs: 05/15/2016: BUN 15; Creat 1.06; Hemoglobin 12.1; Platelets 147; Potassium 4.2; Sodium 141   Lipid Panel No results found for: CHOL, TRIG, HDL, CHOLHDL, VLDL, LDLCALC, LDLDIRECT   Wt Readings from Last 3 Encounters:  05/29/16 213 lb (96.6 kg)  05/17/16 217 lb (98.4 kg)  05/15/16 216 lb 3.2 oz (98.1 kg)     Other studies Reviewed: Additional studies/ records that were reviewed today include: . Review of the above records demonstrates:    ECHOCARDIOGRAM September 2016: Left ventricle: Global hypokinesis worse in the inferolateral and   inferior walls. The cavity size was normal. There was mild   concentric hypertrophy. Systolic function was mildly reduced. The   estimated ejection fraction was in the range of 45% to 50%.   Doppler parameters are consistent with abnormal left ventricular   relaxation (grade 1 diastolic dysfunction). - Aortic valve: Trileaflet; normal thickness, moderately calcified   leaflets. There was moderate stenosis. There was mild   regurgitation. Peak velocity (S): 392 cm/s. Mean gradient (S): 33   mm Hg. Valve area (VTI): 1.28 cm^2. Valve area (Vmax): 1.14 cm^2.   Valve area (Vmean): 1.03 cm^2. - Mitral valve: Calcified annulus. There was trivial regurgitation. - Left atrium: The atrium was severely dilated. - Right ventricle: The cavity size was normal. Wall  thickness was   normal. Systolic function was normal. - Right atrium: The atrium was severely dilated. - Tricuspid valve: There was trivial regurgitation.  Cardiac cath 05/17/16: 1.  Severe native coronary artery disease, including total occlusions of mid LAD, distal LCx, and mid RCA, as well as 99% ostial LCx stenosis. 2.  Widely patent LIMA to LAD and SVG to OM. 3.  Patent SVG to PDA and PL with stable 50% ISR at ostium of SVG. Stable compared to prior angiogram from 2015. 4.  Chronically occluded SVG to diagonal. 5.  Normal right heart filling pressures. 6.   Mildly to moderately elevated left ventricular filling pressure. 7.  Moderate to severe aortic stenosis.  STS Risk Score Risk of Mortality: 2.929%  Morbidity or Mortality: 19.06%  Long Length of Stay: 7.368%  Short Length of Stay: 25.636%  Permanent Stroke: 2.032%  Prolonged Ventilation: 11.142%  DSW Infection: 0.224%  Renal Failure: 5.388%  Reoperation: 7.74%   Assessment and Plan:   1. Severe aortic valve stenosis: He has stage D symptomatic aortic valve stenosis. I have personally reviewed the echo images from one year ago. The aortic valve is thickened, calcified with limited leaflet mobility. I think he would benefit from AVR. STS 2.9. Given advanced age, he is not a good candidate for conventional AVR by surgical approach. I think he may be a good candidate for TAVR. He is a very functional 82 yo patient. I have reviewed the TAVR procedure in detail today with the patient and his family. He would like to proceed with planning for TAVR.  I will arrange an echo now. I will refer him back to Dr. Cyndia Bent for surgical opinion. He will also need to see Dr. Roxy Manns. Cardiac CT and chest/abd/pelvix CTA to be arranged. Will discuss with our TAVR nurse coordinator.   2. Coronary artery disease s/p CABG with angina: His CAD is stable by recent cath 05/17/16 with no targets for PCI. Chest pains likely due to moderate CAD and aortic stenosis.   Current medicines are reviewed at length with the patient today.  The patient does not have concerns regarding medicines.  The following changes have been made:  no change  Labs/ tests ordered today include:   Orders Placed This Encounter  Procedures  . ECHOCARDIOGRAM COMPLETE     Disposition:   FU with me after his procedure.    Signed, Lauree Chandler, MD 05/29/2016 10:45 AM    [facility] [address], [place], [place]  [address] Phone: [phone]; Fax: [phone]

## 2016-06-06 ENCOUNTER — Encounter: Payer: Self-pay | Admitting: Physical Therapy

## 2016-06-06 ENCOUNTER — Ambulatory Visit (HOSPITAL_COMMUNITY)
Admission: RE | Admit: 2016-06-06 | Discharge: 2016-06-06 | Disposition: A | Discharge status: Home / Self Care | Payer: Medicare Other | Source: Ambulatory Visit | Attending: Cardiovascular Disease | Admitting: Cardiovascular Disease

## 2016-06-06 ENCOUNTER — Ambulatory Visit (HOSPITAL_COMMUNITY): Payer: Medicare Other

## 2016-06-06 ENCOUNTER — Ambulatory Visit: Payer: Medicare Other | Attending: Cardiovascular Disease | Admitting: Physical Therapy

## 2016-06-06 DIAGNOSIS — I358 Other nonrheumatic aortic valve disorders: Secondary | ICD-10-CM | POA: Diagnosis not present

## 2016-06-06 DIAGNOSIS — K5730 Diverticulosis of large intestine without perforation or abscess without bleeding: Secondary | ICD-10-CM | POA: Diagnosis not present

## 2016-06-06 DIAGNOSIS — R293 Abnormal posture: Secondary | ICD-10-CM | POA: Diagnosis not present

## 2016-06-06 DIAGNOSIS — R942 Abnormal results of pulmonary function studies: Secondary | ICD-10-CM | POA: Insufficient documentation

## 2016-06-06 DIAGNOSIS — R2689 Other abnormalities of gait and mobility: Secondary | ICD-10-CM | POA: Insufficient documentation

## 2016-06-06 DIAGNOSIS — I77810 Thoracic aortic ectasia: Secondary | ICD-10-CM | POA: Insufficient documentation

## 2016-06-06 DIAGNOSIS — I350 Nonrheumatic aortic (valve) stenosis: Secondary | ICD-10-CM | POA: Diagnosis not present

## 2016-06-06 LAB — PULMONARY FUNCTION TEST
DL/VA % pred: 79 %
DL/VA: 3.53 ml/min/mmHg/L
DLCO UNC: 19.22 ml/min/mmHg
DLCO unc % pred: 64 %
FEF 25-75 POST: 2.78 L/s
FEF 25-75 PRE: 1.87 L/s
FEF2575-%Change-Post: 48 %
FEF2575-%PRED-PRE: 132 %
FEF2575-%Pred-Post: 197 %
FEV1-%Change-Post: 9 %
FEV1-%PRED-POST: 104 %
FEV1-%PRED-PRE: 95 %
FEV1-POST: 2.39 L
FEV1-PRE: 2.19 L
FEV1FVC-%CHANGE-POST: 3 %
FEV1FVC-%PRED-PRE: 109 %
FEV6-%CHANGE-POST: 5 %
FEV6-%Pred-Post: 97 %
FEV6-%Pred-Pre: 92 %
FEV6-POST: 3 L
FEV6-Pre: 2.84 L
FEV6FVC-%Change-Post: 0 %
FEV6FVC-%PRED-POST: 108 %
FEV6FVC-%Pred-Pre: 108 %
FVC-%Change-Post: 5 %
FVC-%PRED-PRE: 84 %
FVC-%Pred-Post: 89 %
FVC-POST: 3 L
FVC-PRE: 2.85 L
PRE FEV1/FVC RATIO: 77 %
Post FEV1/FVC ratio: 80 %
Post FEV6/FVC ratio: 100 %
Pre FEV6/FVC Ratio: 100 %
RV % pred: 112 %
RV: 3.04 L
TLC % PRED: 104 %
TLC: 6.99 L

## 2016-06-06 MED ORDER — ALBUTEROL SULFATE (2.5 MG/3ML) 0.083% IN NEBU
2.5000 mg | INHALATION_SOLUTION | Freq: Once | RESPIRATORY_TRACT | Status: AC
  Administered 2016-06-06: 2.5 mg via RESPIRATORY_TRACT

## 2016-06-06 MED ORDER — IOPAMIDOL (ISOVUE-370) INJECTION 76%
75.0000 mL | Freq: Once | INTRAVENOUS | Status: AC | PRN
  Administered 2016-06-06: 75 mL via INTRAVENOUS

## 2016-06-06 MED ORDER — IOPAMIDOL (ISOVUE-370) INJECTION 76%
INTRAVENOUS | Status: AC
  Filled 2016-06-06: qty 100

## 2016-06-06 NOTE — Therapy (Signed)
[place], [place], [address] Phone: [phone]   Fax:  [phone]  Physical Therapy Evaluation  Patient Details  Name: Tim Campbell MRN: [id number] Date of Birth: [birth year] Referring Provider: Dr. Lauree Chandler  Encounter Date: 06/06/2016      PT End of Session - 06/06/16 1618    Visit Number 1   PT Start Time [id number]   PT Stop Time 1500   PT Time Calculation (min) 39 min      Past Medical History:  Diagnosis Date  . Aortic stenosis    severe by echo September 2014  . Arthritis    osteo knees "   . Chronic combined systolic and diastolic HF (heart failure), NYHA class 3 09/09/2013   LVEF 45%.   . Coronary artery disease    with occluded SVG to diagonal and BMS to prox SVG of RCA 10/2010. Recth 12/2012, 70% ISR SVG to RCA, 50% SVG to Cfx, 100% SVG to diagonal, and Patent LIMA with diffuse LAD dz.  . Diverticulosis   . Enlarged prostate    per patient  with mild effect on urine stream  . Heart murmur   . Hyperlipidemia   . Hypertension   . Hypothyroidism   . Insomnia   . Non Hodgkin's lymphoma ([place])   . Non-ST elevation MI (NSTEMI) ([place])   . RBBB   . Vitamin D deficiency     Past Surgical History:  Procedure Laterality Date  . ANKLE SURGERY    . CARDIAC CATHETERIZATION N/A 05/17/2016   Procedure: Right/Left Heart Cath and Coronary/Graft Angiography;  Surgeon: Nelva Bush, MD;  Location: [facility];  Service: Cardiovascular;  Laterality: N/A;  . CATARACT EXTRACTION W/ INTRAOCULAR LENS  IMPLANT, BILATERAL    . COLONOSCOPY    . CORONARY ARTERY BYPASS GRAFT  1993  . CORONARY STENT PLACEMENT  04/21/2014   bare metal SVG   DR Tamala Julian  . HEMORRHOID SURGERY    . I&D EXTREMITY Left 03/09/2016   Procedure: IRRIGATION AND DEBRIDEMENT WRIST;  Surgeon: Milly Jakob, MD;  Location: [facility];  Service: Orthopedics;  Laterality: Left;  . KNEE ARTHROSCOPY Right 1970'S   . LEFT AND RIGHT  HEART CATHETERIZATION WITH CORONARY/GRAFT ANGIOGRAM N/A 04/05/2014   Procedure: LEFT AND RIGHT HEART CATHETERIZATION WITH [name];  Surgeon: Burnell Blanks, MD;  Location[facility] C[facility];  Service: Cardiovascular;  Laterality: N/A;  . LEFT HEART CATHETERIZATION WITH CORONARY/GRAFT ANGIOGRAM N/A 01/02/2013   Procedure: LEFT HEART CATHETERIZATION WITH [name];  Surgeon: [name], MD;  Location: [facility] C[facility] LAB;  Service: Cardiovascular;  Laterality: N/A;  . OPEN REDUCTION INTERNAL FIXATION (ORIF) DISTAL RADIAL FRACTURE Left 02/24/2016   Procedure: OPEN TREATMENT OF LEFT DISTAL RADIUS FRACTURE;  Surgeon: Milly Jakob, MD;  Location: [facility];  Service: Orthopedics;  Laterality: Left;  . PERCUTANEOUS CORONARY STENT INTERVENTION (PCI-S) N/A 04/21/2014   Procedure: PERCUTANEOUS CORONARY STENT INTERVENTION (PCI-S);  Surgeon: [name], MD;  Location: [facility] CATH LAB;  Service: Cardiovascular;  Laterality: N/A;  . Right inguinal hernia repair      There were no vitals filed for this visit.       Subjective Assessment - 06/06/16 1425    Subjective reports recent exertional chest pain over past 1-2 years, occasional chest discomfort at rest, reports taking a Nitro pill 3-4 weeks ago because of some extended chest discomfort patient experienced at rest that was lasting longer than a normal episode  Pertinent History hx CABG x5 1993, hx MI   Patient Stated Goals stay active   Currently in Pain? No/denies            [facility] PT Assessment - 06/06/16 0001      Assessment   Medical Diagnosis severe aortic stenosis   Referring Provider Dr. Lauree Chandler   Onset Date/Surgical Date 05/29/16     Precautions   Precautions None     Restrictions   Weight Bearing Restrictions No     Balance Screen   Has the patient fallen in the past 6 months Yes   How many times? 1   Has the patient had a decrease in activity level because of a fear of falling?  No    Is the patient reluctant to leave their home because of a fear of falling?  No     Home Environment   Living Environment Private residence   Living Arrangements Spouse/significant other   Home Access Stairs to enter   Entrance Stairs-Number of Steps 4   Entrance Stairs-Rails Left   [place] One level     Prior Function   Level of Independence Independent with community mobility without device     Posture/Postural Control   Posture/Postural Control Postural limitations   Postural Limitations Rounded Shoulders;Forward head     ROM / Strength   AROM / PROM / Strength AROM;Strength     AROM   Overall AROM Comments grossly WFL     Strength   Overall Strength Comments grossly 5/5   Strength Assessment Site Hand   Right/Left hand Right;Left   Right Hand Grip (lbs) 70  R hand dominant   Left Hand Grip (lbs) 21  recent L wrist fx     Ambulation/Gait   Gait Pattern Decreased step length - left;Decreased step length - right;Poor foot clearance - right;Poor foot clearance - left          [facility] Pre-Surgical Assessment - 06/06/16 0001    5 Meter Walk Test- trial 1 7 sec   5 Meter Walk Test- trial 2 7 sec.    5 Meter Walk Test- trial 3 7 sec.  > 6 seconds considered slow speed   5 meter walk test average 7 sec   Timed Up & Go Test trial  12 sec.   Comments </= 12 sec not indicative of fall risk   4 Stage Balance Test tolerated for:  5 sec.   4 Stage Balance Test Position 4   comment not indicative of fall risk   Sit To Stand Test- trial 1 17 sec.   Comment </= 14.8 seconds WNL   ADL/IADL Independent with: Bathing;Dressing;Meal prep;Yard work   ADL/IADL Needs Assistance with: Finances  wife has always done   ADL/IADL Fraility Index Vulnerable   6 Minute Walk- Baseline yes   BP (mmHg) 98/62   HR (bpm) 62   02 Sat (%RA) 95 %   Modified Borg Scale for Dyspnea 0- Nothing at all   Perceived Rate of Exertion (Borg) 6-   6 Minute Walk Post Test yes   BP (mmHg) 140/60    HR (bpm) 80   02 Sat (%RA) 96 %   Modified Borg Scale for Dyspnea 2- Mild shortness of breath   Perceived Rate of Exertion (Borg) 13- Somewhat hard   Aerobic Endurance Distance Walked 840   Endurance additional comments no seated rest breaks required  Plan - 06/06/16 1619    Clinical Impression Statement Pt is an 82 yo male presenting for OP PT Evaluation before possible TAVR surgery due to severe aortic stenosis. Pt reports exertional chest pain over last 1-2 years, primarily with walking on inclines and uneven surfaces. Pt remains very independent. Pt presents with good ROM, strength, slightly slower walking speed, good balance, and moderately limited aerobic endurance. Pt able to ambulate 62% of normal distance for age/gender.  (38% limited)    PT Frequency One time visit   Consulted and Agree with Plan of Care Patient      Patient demonstrated the following deficits and impairments:     Visit Diagnosis: Other abnormalities of gait and mobility - Plan: PT plan of care cert/re-cert  Abnormal posture - Plan: PT plan of care cert/re-cert      G-Codes - [id number]    Functional Assessment Tool Used 6 minute walk 840'   Functional Limitation Mobility: Walking and moving around   Mobility: Walking and Moving Around Current Status [phone]) At least 20 percent but less than 40 percent impaired, limited or restricted   Mobility: Walking and Moving Around Goal Status [phone]) At least 20 percent but less than 40 percent impaired, limited or restricted   Mobility: Walking and Moving Around Discharge Status [phone]) At least 20 percent but less than 40 percent impaired, limited or restricted       Problem List Patient Active Problem List   Diagnosis Date Noted  . Postoperative wound infection 03/11/2016  . Preoperative cardiovascular examination 02/20/2016  . Hyperlipidemia 06/17/2014  . Severe aortic stenosis 09/09/2013   . Chronic combined systolic and diastolic HF (heart failure), NYHA class 3 ([place]) 09/09/2013  . CAD (coronary artery disease) of artery bypass graft 01/03/2013  . HTN (hypertension) 01/03/2013  . Non-ST elevation MI (NSTEMI) ([place]) 01/02/2013    Class: Acute    NICOLETTA,DANA, PT 06/06/2016, 4:24 PM  [facility] [address] [place], [place], [address] Phone: [phone]   Fax:  [phone]  Name: Tim Campbell MRN: [id number] Date of Birth: 05-06-1926

## 2016-06-07 ENCOUNTER — Ambulatory Visit (HOSPITAL_COMMUNITY): Payer: Medicare Other | Attending: Cardiology

## 2016-06-07 ENCOUNTER — Other Ambulatory Visit: Payer: Self-pay

## 2016-06-07 DIAGNOSIS — I340 Nonrheumatic mitral (valve) insufficiency: Secondary | ICD-10-CM | POA: Insufficient documentation

## 2016-06-07 DIAGNOSIS — E785 Hyperlipidemia, unspecified: Secondary | ICD-10-CM | POA: Insufficient documentation

## 2016-06-07 DIAGNOSIS — I509 Heart failure, unspecified: Secondary | ICD-10-CM | POA: Diagnosis not present

## 2016-06-07 DIAGNOSIS — I352 Nonrheumatic aortic (valve) stenosis with insufficiency: Secondary | ICD-10-CM | POA: Insufficient documentation

## 2016-06-07 DIAGNOSIS — I110 Hypertensive heart disease with heart failure: Secondary | ICD-10-CM | POA: Insufficient documentation

## 2016-06-07 DIAGNOSIS — I2510 Atherosclerotic heart disease of native coronary artery without angina pectoris: Secondary | ICD-10-CM | POA: Diagnosis not present

## 2016-06-07 DIAGNOSIS — Z87891 Personal history of nicotine dependence: Secondary | ICD-10-CM | POA: Diagnosis not present

## 2016-06-07 DIAGNOSIS — I252 Old myocardial infarction: Secondary | ICD-10-CM | POA: Diagnosis not present

## 2016-06-07 DIAGNOSIS — I350 Nonrheumatic aortic (valve) stenosis: Secondary | ICD-10-CM | POA: Diagnosis not present

## 2016-06-07 DIAGNOSIS — I77810 Thoracic aortic ectasia: Secondary | ICD-10-CM | POA: Insufficient documentation

## 2016-06-08 DIAGNOSIS — Z952 Presence of prosthetic heart valve: Secondary | ICD-10-CM

## 2016-06-08 HISTORY — DX: Presence of prosthetic heart valve: Z95.2

## 2016-06-13 ENCOUNTER — Encounter: Payer: Self-pay | Admitting: Surgery

## 2016-06-13 ENCOUNTER — Ambulatory Visit (INDEPENDENT_AMBULATORY_CARE_PROVIDER_SITE_OTHER): Payer: Medicare Other | Admitting: Surgery

## 2016-06-13 ENCOUNTER — Other Ambulatory Visit: Payer: Self-pay | Admitting: *Deleted

## 2016-06-13 VITALS — BP 99/54 | HR 50 | Resp 20 | Ht 70.0 in | Wt 213.0 lb

## 2016-06-13 DIAGNOSIS — I350 Nonrheumatic aortic (valve) stenosis: Secondary | ICD-10-CM | POA: Diagnosis not present

## 2016-06-13 DIAGNOSIS — I25810 Atherosclerosis of coronary artery bypass graft(s) without angina pectoris: Secondary | ICD-10-CM | POA: Diagnosis not present

## 2016-06-15 ENCOUNTER — Encounter: Payer: Self-pay | Admitting: Surgery

## 2016-06-15 NOTE — Progress Notes (Signed)
HPI:  The patient is an 82 year old gentleman who underwent coronary bypass graft surgery x 5 by [name] in 1993. He did well until 2005 when he had recurrent chest pain and ruled in for a NSTEMI. Cath showed recent occlusion of the diagonal vein graft with patent vein grafts to the OM and RCA and a patent LIMA to the LAD. He has subsequently had a BMS to the proximal SVG to the RCA in 10/2010. Cath in March 2014 which showed a 70% ISR of the SVG to the RCA, 50% proximal stenosis of the SVG to the OM, chronic occlusion of the SVG to the Diagonal, and a patent LIMA to the LAD which was diffusely diseased. An echo in September 2014 showed severe AS with a peak velocity of 4.08 m/s, a mean aortic valve gradient of 37 mm Hg, and an AVA of 0.8 cm2. There was mild AI and mild MR with an LVEF of 45-50%. I saw him in March 2015 for consideration of TAVR and he was reporting chest discomfort and shortness of breath with mild to moderate activity. He really did not want any surgery at that time but did undergo repeat cath showing a severe stenosis in the proximal OM SVG. He underwent stenting of this graft in 04/2014 and his exertional chest pain resolved. He was still having some exertional shortness of breath but decreased his activity so that it would not bother him. He did not want surgery so we decided to continue following this. An echo in September 2016 showed moderately severe AS with a peak velocity of 3.92 m/s, a mean aortic valve gradient of 33 mm Hg, and an AVA of 1.03 cm2. There was mild AI and mild MR with an LVEF of 45-50%. He recently saw Dr. Tamala Julian for cardiology follow up with complaints of chest pressure and shortness of breath with mild activity as well as constant fatigue. He fell and broke his wrist in April 2017 and had two surgeries on his wrist. He denies syncope and says that he tripped and fell. He has some ankle edema. He underwent a repeat cath on 05/17/2016 showing no significant  change in the appearance of his vessels or grafts compared to his last cath in 2015. His recent echo on 06/07/2016 showed an increase in his mean AV gradient to 41 mm Hg with a DI of 0.17 consistent with severe AS. LVEF was 45-50%  Current Outpatient Prescriptions  Medication Sig Dispense Refill  . Ascorbic Acid (VITAMIN C) 1000 MG tablet Take 1,000 mg by mouth every other day.    Marland Kitchen aspirin EC 81 MG tablet Take 81 mg by mouth daily.    Marland Kitchen atorvastatin (LIPITOR) 40 MG tablet Take 40 mg by mouth daily.    . B Complex-C (B-COMPLEX WITH VITAMIN C) tablet Take 1 tablet by mouth 3 (three) times a week.    . CHLOROPHYLL PO Take 2 tablets by mouth daily.    . finasteride (PROSCAR) 5 MG tablet Take 5 mg by mouth daily.  11  . folic acid (FOLVITE) [id number] MCG tablet Take 800 mcg by mouth every other day.    . hydrochlorothiazide (MICROZIDE) 12.5 MG capsule TAKE 1 CAPSULE (12.5 MG TOTAL) BY MOUTH DAILY. 30 capsule 6  . isosorbide mononitrate (IMDUR) 60 MG 24 hr tablet Take one and a half (1.5) tablet (90 mg total) by mouth daily.    Marland Kitchen levothyroxine (SYNTHROID, LEVOTHROID) 125 MCG tablet Take 125 mcg by mouth  daily before breakfast.   3  . Magnesium 200 MG TABS Take 200 mg by mouth daily.    . metoprolol tartrate (LOPRESSOR) 25 MG tablet Take 25 mg by mouth daily.    . Multiple Vitamins-Iron (CHLORELLA PO) Take 4 tablets by mouth 2 (two) times daily. MED NAME: CHLORELLA    . NAT-RUL PSYLLIUM SEED HUSKS PO Take 610 mg by mouth daily.     . nitroGLYCERIN (NITROSTAT) 0.4 MG SL tablet Place 1 tablet (0.4 mg total) under the tongue every 5 (five) minutes as needed for chest pain. 25 tablet 11  . Potassium 99 MG TABS Take 99 mg by mouth every other day.     . SELENIUM PO Take 1 tablet by mouth every other day.    . tamsulosin (FLOMAX) 0.4 MG CAPS capsule Take 0.4 mg by mouth 2 (two) times daily.  11   No current facility-administered medications for this visit.      Physical Exam: BP (!) 99/54 (BP Location: Left  Arm, Patient Position: Sitting, Cuff Size: Normal)   Pulse (!) 50   Resp 20   Ht 5\' 10"  (1.778 m)   Wt 213 lb (96.6 kg)   SpO2 98% Comment: RA  BMI 30.56 kg/m  He looks well Lung exam is clear Cardiac exam shows a regular rate and rhythm with a harsh 3/6 systolic murmur along the RSB. There is mild lower extremity edema bilaterally  Diagnostic Tests:  Physicians   Panel Physicians Referring Physician Case Authorizing Physician  Nelva Bush, MD (Primary)    Belva Crome, MD (Assisting)    Procedures   Right/Left Heart Cath and Coronary/Graft Angiography  Conclusion     Ost Cx lesion, 99 %stenosed.  Mid Cx to Dist Cx lesion, 100 %stenosed.  LM lesion, 50 %stenosed.  Ost LAD to Prox LAD lesion, 70 %stenosed.  Prox LAD lesion, 100 %stenosed.  LIMA graft was visualized by angiography and is normal in caliber and anatomically normal.  Mid LAD to Dist LAD lesion, 60 %stenosed.  SVG graft was visualized by angiography.  Prox Graft lesion, 100 %stenosed.  SVG graft was visualized by angiography and is normal in caliber.  The graft exhibits minimal luminal irregularities.  Origin to Prox Graft lesion, 0 %stenosed.  Mid RCA lesion, 100 %stenosed.  RPDA lesion, 95 %stenosed.  Dist RCA lesion, 100 %stenosed.  LV end diastolic pressure is moderately elevated.  There is moderate aortic valve stenosis.  Origin to Prox Graft lesion, 50 %stenosed.  Ost 1st Mrg lesion, 100 %stenosed.   1.  Severe native coronary artery disease, including total occlusions of mid LAD, distal LCx, and mid RCA, as well as 99% ostial LCx stenosis. 2.  Widely patent LIMA to LAD and SVG to OM. 3.  Patent SVG to PDA and PL with stable 50% ISR at ostium of SVG. Stable compared to prior angiogram from 2015. 4.  Chronically occluded SVG to diagonal. 5.  Normal right heart filling pressures. 6.  Mildly to moderately elevated left ventricular filling pressure. 7.  Moderate to severe  aortic stenosis.  Plan: 1.  Continue medical therapy of coronary artery disease, as overall appearance is similar to prior catheterization in 2015. 2.  Patient continues to decline TAVR; would benefit from referral back to TAVR team if he decides to proceed with valve intervention in the future.   Indications   Calcific aortic stenosis [I35.0 (ICD-10-CM)]  Coronary artery disease involving coronary bypass graft with unspecified angina pectoris [I25.709 (ICD-10-CM)]  Procedural Details/Technique   Technical Details Indication: 82 year old man with history of CAD s/p remote CABG and subsequent PCI's to SVG's to OM and RCA, severe aortic stenosis, systolic and diastolic heart failure, and hypertension, who presents for further evaluation of progressive exertional chest pain, which is now present with mild activity (CCS class III).  Procedure: The risks, benefits, complications, treatment options, and expected outcomes were discussed with the patient. The patient and/or family concurred with the proposed plan, giving informed consent. The patient was brought to the cath lab after IV hydration was begun and oral premedication was given. The patient was further sedated with Versed and Fentanyl. The right groin was prepped and draped in the usual manner. Using the modified Seldinger access technique, a 5 French sheath was placed in the right femoral artery; a 7 French sheath was placed in the right femoral vein.  Right heart catheterization was performed by advancing a 7 French balloon tipped catheter through the right heart chambers into the pulmonary capillary wedge position. Right heart pressures, oxygen saturations, and thermodilution measurements were obtained.  Selective coronary angiography was performed using 5 [place] JL4 and JR4 catheters to engage the left and right coronary arteries, respectively. Saphenous vein bypass graft angiography was performed using the 5 [place] JR4 catheter. LIMA  bypass graft angiography was performed with a 5 [place] IM catheter.  Left heart catheterization was performed using a 5 Fench JR4 catheter across the aortic valve using a 0.035" straight wire. Heparin 2,000 units were administered before attempting to cross the valve. Aortic valve gradient was obtained with a pullback. Left ventriculogram was not performed.  There were no immediate complications. The patient was taken to the recovery area in stable condition.    Estimated blood loss <50 mL. .    Coronary Findings   Dominance: Right  Left Main  LM lesion, 50% stenosed. The lesion is calcified.  Left Anterior Descending  Ost LAD to Prox LAD lesion, 70% stenosed. The lesion is eccentric. The lesion is severely calcified.  Prox LAD lesion, 100% stenosed. The lesion is chronically occluded.  Mid LAD to Dist LAD lesion, 60% stenosed.  First Diagonal Branch  Vessel is moderate in size.  Second Diagonal Branch  Vessel is small in size.  Third Diagonal Branch  Vessel is small in size.  Left Circumflex  Vessel is moderate in size.  Ost Cx lesion, 99% stenosed. The lesion is discrete.  Mid Cx to Dist Cx lesion, 100% stenosed.  First Obtuse Marginal Branch  Ost 1st Mrg lesion, 100% stenosed.  Second Obtuse Marginal Branch  Vessel is moderate in size. There is moderate disease in the vessel.  Right Coronary Artery  Mid RCA lesion, 100% stenosed. The lesion is chronically occluded.  Dist RCA lesion, 100% stenosed.  Right Posterior Descending Artery  Vessel is small in size.  RPDA lesion, 95% stenosed. The lesion is discrete.  Graft Angiography  Free LIMA Graft to Mid LAD  LIMA graft was visualized by angiography and is normal in caliber and anatomically normal.  saphenous Graft to 1st Diag  SVG graft was visualized by angiography. Graft chronically occluded at ostium.  Prox Graft lesion, 100% stenosed.  saphenous Graft to 1st Mrg  SVG graft was visualized by angiography and is normal  in caliber. The graft exhibits minimal luminal irregularities.  Origin to Prox Graft lesion, 0% stenosed. Previously placed Origin to Prox Graft bare metal stent is widely patent.  Sequential Graft to Ost RPDA, Dist Cx  Origin to Prox Graft lesion before Ost RPDA, 50% stenosed. The lesion was previously treated using a bare metal stent over 2 years ago. Stent protrudes into aorta, precluding complete engagement.  Right Heart   Right Heart Pressures Elevated LV EDP consistent with volume overload.    Right Atrium Right atrial pressure is normal.    Left Heart   Left Ventricle LV end diastolic pressure is moderately elevated.    Aortic Valve There is moderate aortic valve stenosis. The aortic valve is calcified.    Coronary Diagrams   Diagnostic Diagram     Implants     No implant documentation for this case.  PACS Images   Show images for Cardiac catheterization   Link to Procedure Log   Procedure Log    Hemo Data   Flowsheet Row Most Recent Value  Fick Cardiac Output 6.23 L/min  Fick Cardiac Output Index 2.88 (L/min)/BSA  Thermal Cardiac Output 4.84 L/min  Thermal Cardiac Output Index 2.24 (L/min)/BSA  Aortic Mean Gradient 34.3 mmHg  Aortic Peak Gradient 35 mmHg  Aortic Valve Area 0.79  Aortic Value Area Index 0.36 cm2/BSA  RA A Wave 9 mmHg  RA V Wave 8 mmHg  RA Mean 7 mmHg  RV Systolic Pressure 28 mmHg  RV Diastolic Pressure 0 mmHg  RV EDP 8 mmHg  PA Systolic Pressure 28 mmHg  PA Diastolic Pressure 10 mmHg  PA Mean 18 mmHg  PW A Wave 16 mmHg  PW V Wave 16 mmHg  PW Mean 15 mmHg  AO Systolic Pressure [id number] mmHg  AO Diastolic Pressure 56 mmHg  AO Mean 87 mmHg  LV Systolic Pressure 99991111 mmHg  LV Diastolic Pressure 16 mmHg  LV EDP 29 mmHg  Arterial Occlusion Pressure Extended Systolic Pressure [id number] mmHg  Arterial Occlusion Pressure Extended Diastolic Pressure 70 mmHg  Arterial Occlusion Pressure Extended Mean Pressure 105 mmHg  Left Ventricular Apex Extended  Systolic Pressure 123XX123 mmHg  Left Ventricular Apex Extended Diastolic Pressure 18 mmHg  Left Ventricular Apex Extended EDP Pressure 31 mmHg  TPVR Index 8.04 HRUI  TSVR Index 38.83 HRUI  PVR SVR Ratio 0.03  TPVR/TSVR Ratio 0.21        *[facility]*                        [address], [place] [address]                            [phone]  ------------------------------------------------------------------- Transthoracic Echocardiography  Patient:    Aksh, Derda MR #:       [id number] Study Date: 06/07/2016 Gender:     M Age:        49 Height:     177.8 cm Weight:     96.6 kg BSA:        2.21 m^2 Pt. Status: Room:   ATTENDING    Loralie Champagne, M.D.  SONOGRAPHER  Wyatt Mage, RDCS  ORDERING     Crestone, Christopher  REFERRING    McAlhany, Christopher  PERFORMING   [facility], Outpatient  cc:  ------------------------------------------------------------------- LV EF: 45% -   50%  ------------------------------------------------------------------- Indications:      Aortic stenosis (I35).  ------------------------------------------------------------------- History:   PMH:   Coronary artery disease.  Congestive heart  failure.  Aortic valve disease.  PMH:   Myocardial infarction. Risk factors:  Former tobacco use. Hypertension. Dyslipidemia.  ------------------------------------------------------------------- Study Conclusions  - Left ventricle: The cavity size was mildly dilated. Wall   thickness was normal. Basal to mid inferior and basal to mid   inferolateral severe hypokinesis. Systolic function was mildly   reduced. The estimated ejection fraction was in the range of 45%   to 50%. Doppler parameters are consistent with abnormal left   ventricular relaxation (grade 1 diastolic dysfunction). - Aortic valve: Trileaflet; severely calcified leaflets. There was   severe stenosis. There was mild to moderate  regurgitation. Mean   gradient (S): 41 mm Hg. Valve area (VTI): 0.75 cm^2. - Aorta: Dilated aortic root. Aortic root dimension: 42 mm (ED). - Mitral valve: Mildly calcified annulus. There was mild   regurgitation. - Left atrium: The atrium was moderately dilated. - Right ventricle: The cavity size was normal. Systolic function   was mildly reduced. - Right atrium: The atrium was mildly dilated. - Tricuspid valve: Peak RV-RA gradient (S): 23 mm Hg. - Pulmonary arteries: PA peak pressure: 26 mm Hg (S). - Inferior vena cava: The vessel was normal in size. The   respirophasic diameter changes were in the normal range (>= 50%),   consistent with normal central venous pressure.  Impressions:  - Mildly dilated LV with EF 45-50%. Wall motion abnormalities as   noted above. Normal RV size with mildly decreased systolic   function. Severe aortic stenosis with mild to moderate aortic   insufficiency. Mild mitral regurgitation.  ------------------------------------------------------------------- Labs, prior tests, procedures, and surgery: Transthoracic echocardiography (07/07/2015).    The aortic valve showed mild regurgitation.  EF was 50%. Aortic valve: peak gradient of 61 mm Hg and mean gradient of 33 mm Hg.  ------------------------------------------------------------------- Study data:  Comparison was made to the study of 07/07/2015.  Study status:  Routine.  Procedure:  The patient reported no pain pre or post test. Transthoracic echocardiography. Image quality was adequate.  Study completion:  There were no complications. Transthoracic echocardiography.  M-mode, complete 2D, spectral Doppler, and color Doppler.  Birthdate:  Patient birthdate: Jun 03, 1926.  Age:  Patient is 82 yr old.  Sex:  Gender: male. BMI: 30.6 kg/m^2.  Blood pressure:     110/50  Patient status: Outpatient.  Study date:  Study date: 06/07/2016. Study time: 11:39 AM.  Location:  [facility]  -------------------------------------------------------------------  ------------------------------------------------------------------- Left ventricle:  The cavity size was mildly dilated. Wall thickness was normal. Basal to mid inferior and basal to mid inferolateral severe hypokinesis. Systolic function was mildly reduced. The estimated ejection fraction was in the range of 45% to 50%. Doppler parameters are consistent with abnormal left ventricular relaxation (grade 1 diastolic dysfunction).  ------------------------------------------------------------------- Aortic valve:   Trileaflet; severely calcified leaflets.  Doppler:  There was severe stenosis.   There was mild to moderate regurgitation.    VTI ratio of LVOT to aortic valve: 0.18. Valve area (VTI): 0.75 cm^2. Indexed valve area (VTI): 0.34 cm^2/m^2. Peak velocity ratio of LVOT to aortic valve: 0.17. Valve area (Vmax): 0.7 cm^2. Indexed valve area (Vmax): 0.32 cm^2/m^2. Mean velocity ratio of LVOT to aortic valve: 0.15. Valve area (Vmean): 0.64 cm^2. Indexed valve area (Vmean): 0.29 cm^2/m^2.    Mean gradient (S): 41 mm Hg. Peak gradient (S): 62 mm Hg.  ------------------------------------------------------------------- Aorta:  Dilated aortic root.  ------------------------------------------------------------------- Mitral valve:   Mildly calcified annulus.  Doppler:   There was no evidence for  stenosis.   There was mild regurgitation.    Peak gradient (D): 2 mm Hg.  ------------------------------------------------------------------- Left atrium:  The atrium was moderately dilated.  ------------------------------------------------------------------- Right ventricle:  The cavity size was normal. Systolic function was mildly reduced.  ------------------------------------------------------------------- Pulmonic valve:    Structurally normal valve.   Cusp separation was normal.  Doppler:  Transvalvular  velocity was within the normal range. There was trivial regurgitation.  ------------------------------------------------------------------- Tricuspid valve:   Doppler:  There was trivial regurgitation.   ------------------------------------------------------------------- Right atrium:  The atrium was mildly dilated.  ------------------------------------------------------------------- Pericardium:  There was no pericardial effusion.  ------------------------------------------------------------------- Systemic veins: Inferior vena cava: The vessel was normal in size. The respirophasic diameter changes were in the normal range (>= 50%), consistent with normal central venous pressure.  ------------------------------------------------------------------- Post procedure conclusions Ascending Aorta:  - Dilated aortic root.  ------------------------------------------------------------------- Measurements   Left ventricle                            Value          Reference  LV ID, ED, PLAX chordal           (H)     59.5  mm       43 - 52  LV ID, ES, PLAX chordal           (H)     52.4  mm       23 - 38  LV fx shortening, PLAX chordal    (L)     12    %        >=29  LV PW thickness, ED                       11.7  mm       ---------  IVS/LV PW ratio, ED                       0.97           <=1.3  Stroke volume, 2D                         82    ml       ---------  Stroke volume/bsa, 2D                     37    ml/m^2   ---------  LV e&', lateral                            10    cm/s     ---------  LV E/e&', lateral                          7.25           ---------  LV e&', medial                             6.27  cm/s     ---------  LV E/e&', medial                           11.56          ---------  LV e&', average  8.14  cm/s     ---------  LV E/e&', average                          8.91           ---------    Ventricular septum                         Value          Reference  IVS thickness, ED                         11.4  mm       ---------    LVOT                                      Value          Reference  LVOT ID, S                                23    mm       ---------  LVOT area                                 4.15  cm^2     ---------  LVOT peak velocity, S                     65.8  cm/s     ---------  LVOT mean velocity, S                     44.7  cm/s     ---------  LVOT VTI, S                               19.7  cm       ---------    Aortic valve                              Value          Reference  Aortic valve peak velocity, S             391   cm/s     ---------  Aortic valve mean velocity, S             291   cm/s     ---------  Aortic valve VTI, S                       109   cm       ---------  Aortic mean gradient, S                   38    mm Hg    ---------  Aortic peak gradient, S                   61    mm Hg    ---------  VTI ratio, LVOT/AV  0.18           ---------  Aortic valve area, VTI                    0.75  cm^2     ---------  Aortic valve area/bsa, VTI                0.34  cm^2/m^2 ---------  Velocity ratio, peak, LVOT/AV             0.17           ---------  Aortic valve area, peak velocity          0.7   cm^2     ---------  Aortic valve area/bsa, peak               0.32  cm^2/m^2 ---------  velocity  Velocity ratio, mean, LVOT/AV             0.15           ---------  Aortic valve area, mean velocity          0.64  cm^2     ---------  Aortic valve area/bsa, mean               0.29  cm^2/m^2 ---------  velocity  Aortic regurg pressure half-time          596   ms       ---------    Aorta                                     Value          Reference  Aortic root ID, ED                        42    mm       ---------  Ascending aorta ID, A-P, S                38    mm       ---------    Left atrium                               Value          Reference  LA ID, A-P, ES                             65    mm       ---------  LA ID/bsa, A-P                    (H)     2.94  cm/m^2   <=2.2  LA volume, S                              108   ml       ---------  LA volume/bsa, S                          48.9  ml/m^2   ---------  LA volume, ES, 1-p A4C  86    ml       ---------  LA volume/bsa, ES, 1-p A4C                38.9  ml/m^2   ---------  LA volume, ES, 1-p A2C                    123   ml       ---------  LA volume/bsa, ES, 1-p A2C                55.7  ml/m^2   ---------    Mitral valve                              Value          Reference  Mitral E-wave peak velocity               72.5  cm/s     ---------  Mitral A-wave peak velocity               78.3  cm/s     ---------  Mitral deceleration time          (H)     274   ms       150 - 230  Mitral peak gradient, D                   2     mm Hg    ---------  Mitral E/A ratio, peak                    0.9            ---------    Pulmonary arteries                        Value          Reference  PA pressure, S, DP                        26    mm Hg    <=30    Tricuspid valve                           Value          Reference  Tricuspid regurg peak velocity            239   cm/s     ---------  Tricuspid peak RV-RA gradient             23    mm Hg    ---------    Right ventricle                           Value          Reference  RV s&', lateral, S                         9.21  cm/s     ---------    Pulmonic valve                            Value          Reference  Pulmonic regurg velocity,  ED              97.9  cm/s     ---------  Legend: (L)  and  (H)  mark values outside specified reference range.  ------------------------------------------------------------------- Prepared and Electronically Authenticated by  Loralie Champagne, M.D. 2017-08-31T22:26:39  ADDENDUM REPORT: 06/06/2016 14:17  CLINICAL DATA:  82 year old male with severe aortic stenosis.  EXAM: Cardiac TAVR  CT  TECHNIQUE: The patient was scanned on a Philips 256 scanner. A 120 kV retrospective scan was triggered in the descending thoracic aorta at 111 HU's. Gantry rotation speed was 270 msecs and collimation was .9 mm. No beta blockade or nitro were given. The 3D data set was reconstructed in 5% intervals of the R-R cycle. Systolic and diastolic phases were analyzed on a dedicated work station using MPR, MIP and VRT modes. The patient received 80 cc of contrast.  FINDINGS: Aortic Valve: Trileaflet, severely thickened and calcified aortic valve with severely restricted leaflet opening. There are mildl calcifications extending into the LVOT predominantly under the left coronary cusp.  Aorta: Aortic root is mildly dilated measuring 21mm in its widest diameter, ascending aortic size is at upper normal limit measuring 40 mm. There are moderate diffuse calcifications and no dissection.  Sinotubular Junction:  35 x 35 mm  Ascending Thoracic Aorta:  40 x 38 mm  Aortic Arch:  32 x 29 mm  Descending Thoracic Aorta:  31 x 28 mm  Sinus of Valsalva is mildly dilated with measurements:  Non-coronary:  41 mm  Right -coronary:  41 mm  Left -coronary:  44 mm  Coronary Artery Height above Annulus:  Left Main:  12 mm  Right Coronary:  19 mm  Virtual Basal Annulus Measurements:  Maximum / Minimum Diameter:  34 x 25 mm  Perimeter:  111 mm  Area:  658 mm2  Optimum Fluoroscopic Angle for Delivery:  RAO 8 CRA 4  IMPRESSION: 1. Trileaflet, severely thickened and calcified aortic valve with severely restricted leaflet opening and mild calcification sin the LVOT. Annular measurements at the higher end of 29 mm Edward-SAPIEN 3 valve but still within acceptable range.  2. Aortic root is mildly dilated and ascending aorta is at the upper normal limit.  3. Sufficient annulus to coronary distance.  4. Optimum Fluoroscopic Angle for Delivery:  RAO 8 CRA  4  Ena Dawley   Electronically Signed   By: Ena Dawley   On: 06/06/2016 14:17   CLINICAL DATA:  82 year old male with history of severe aortic stenosis. Preprocedural study prior to potential transcatheter aortic valve replacement (TAVR) procedure.  EXAM: CT ANGIOGRAPHY CHEST, ABDOMEN AND PELVIS  TECHNIQUE: Multidetector CT imaging through the chest, abdomen and pelvis was performed using the standard protocol during bolus administration of intravenous contrast. Multiplanar reconstructed images and MIPs were obtained and reviewed to evaluate the vascular anatomy.  CONTRAST:  150 mL of Isovue-300.  COMPARISON:  PET-CT 06/17/2006  FINDINGS: CTA CHEST FINDINGS  Cardiovascular: Heart size is enlarged with left ventricular and left atrial dilatation. There is no significant pericardial fluid, thickening or pericardial calcification. There is aortic atherosclerosis, as well as atherosclerosis of the great vessels of the mediastinum and the coronary arteries, including calcified atherosclerotic plaque in the left main, left anterior descending, left circumflex and right coronary arteries. Status post median sternotomy for CABG, including LIMA to the LAD. Extremely severe calcification of the aortic valve.  Mediastinum/Nodes: No pathologically enlarged mediastinal or hilar lymph nodes. Esophagus is unremarkable in appearance. No axillary lymphadenopathy.  Lungs/Pleura: 11 x 9 mm (mean diameter of 10 mm) pulmonary nodule in the medial aspect of the left lower lobe (image 72 of series 407). 6 mm pulmonary nodule in the inferior segment of the lingula (image 70 of series 407). No acute consolidative airspace disease. No pleural effusions.  Musculoskeletal: Median sternotomy wires. There are no aggressive appearing lytic or blastic lesions noted in the visualized portions of the skeleton. Multiple old healed inferior left-sided rib fractures are  incidentally noted.  CTA ABDOMEN AND PELVIS FINDINGS  Hepatobiliary: Small calcified granuloma in segment 4A. No other suspicious hepatic lesions. No intra or extrahepatic biliary ductal dilatation. Gallbladder is normal in appearance.  Pancreas: No pancreatic mass. No pancreatic ductal dilatation. No pancreatic or peripancreatic fluid or inflammatory changes.  Spleen: Unremarkable.  Adrenals/Urinary Tract: Multiple low-attenuation lesions in the kidneys bilaterally, too small to characterize, but statistically likely cysts. 2.1 cm simple cyst in the posterior aspect of the interpolar region of the right kidney. No suspicious renal lesions. No hydroureteronephrosis. Bilateral adrenal glands are normal in appearance. Urinary bladder is heavily trabeculated, with multiple tiny bladder wall diverticulae.  Stomach/Bowel: The appearance of the stomach is normal. No pathologic dilatation of small bowel or colon. Numerous colonic diverticulae are noted, particularly in the descending colon and sigmoid colon, without surrounding inflammatory changes to suggest an acute diverticulitis at this time. The appendix is not confidently identified and may be surgically absent. Regardless, there are no inflammatory changes noted adjacent to the cecum to suggest the presence of an acute appendicitis at this time.  Vascular/Lymphatic: Aortic atherosclerosis, with vascular findings and measurements pertinent to potential TAVR procedure, as detailed below. In addition, there is short segment fusiform aneurysmal dilatation of the infrarenal abdominal aorta which measures up to 3.3 x 3.0 cm. There is also aneurysmal dilatation of the common iliac arteries bilaterally measuring up to 2 cm on the right and 2.4 cm on the left. No lymphadenopathy noted in the abdomen or pelvis.  Reproductive: Prostate gland is enlarged measuring 5.3 x 5.4 x 5.5 cm. Seminal vesicles are unremarkable in  appearance.  Other: No significant volume of ascites.  No pneumoperitoneum.  Musculoskeletal: There are no aggressive appearing lytic or blastic lesions noted in the visualized portions of the skeleton.  VASCULAR MEASUREMENTS PERTINENT TO TAVR:  AORTA:  Minimal Aortic Diameter -  17 x 18 mm  Severity of Aortic Calcification -  severe  RIGHT PELVIS:  Right Common Iliac Artery -  Minimal Diameter - 11.5 x 8.0 mm  Tortuosity - mild  Calcification - moderate to severe  Right External Iliac Artery -  Minimal Diameter - 9.5 x 10.0 mm  Tortuosity - moderate to severe  Calcification - minimal  Right Common Femoral Artery -  Minimal Diameter - 10.6 x 5.9 mm  Tortuosity - mild  Calcification - moderate  LEFT PELVIS:  Left Common Iliac Artery -  Minimal Diameter - 9.3 x 9.2 mm  Tortuosity - moderate  Calcification - moderate to severe  Left External Iliac Artery -  Minimal Diameter - 11.1 x 10.1 mm  Tortuosity - moderate to severe  Calcification - minimal  Left Common Femoral Artery -  Minimal Diameter - 11.1 x 6.1 mm  Tortuosity - mild  Calcification - mild to moderate  Review of the MIP images confirms the above findings.  IMPRESSION: 1. Vascular findings and measurements pertinent to potential TAVR procedure, as detailed above. Patient has suitable pelvic arterial access bilaterally. 2. Extremely severe calcifications of the  aortic valve, compatible with the reported clinical history of severe aortic stenosis. 3. **An incidental finding of potential clinical significance has been found. Two pulmonary nodules are noted in the left lung, the largest of which has a mean diameter of 1 cm. Non-contrast chest CT at 3-6 months is recommended. If the nodules are stable at time of repeat CT, then future CT at 18-24 months (from today's scan) is considered optional for low-risk patients, but is recommended for high-risk  patients. This recommendation follows the consensus statement: Guidelines for Management of Incidental Pulmonary Nodules Detected on CT Images:From the Fleischner Society 2017; published online before print (10.1148/radiol.IJ:2314499).** 4. **An incidental finding of potential clinical significance has been found. Abdominal aortic aneurysm measuring 3.3 x 3.0 cm. Recommend followup by ultrasound in 3 years. This recommendation follows ACR consensus guidelines: White Paper of the ACR Incidental Findings Committee II on Vascular Findings. J Am Coll Radiol 2013; 10:789-794. ** 5. Colonic diverticulosis without evidence of acute diverticulitis at this time. 6. Additional incidental findings, as above.   Electronically Signed   By: Vinnie Langton M.D.   On: 06/06/2016 13:23  Impression:  This 82 year old gentleman has stage D severe symptomatic aortic stenosis with progressive symptoms of exertional fatigue and shortness of breath. I have personally reviewed and interpreted his recent echo, cath and CTA studies. His aortic valve leaflets are severely calcified with poor leaflet mobility and the mean gradient has continued to rise to 41 mm Hg. His cath shows stable bypass grafts and no reason for intervention. I think TAVR is the best treatment for him. His operative risk with open surgical AVR and CABG would be high given his advanced age, prior CABG, and comorbid factors. His cardiac CT shows an annular area of 658 which is at the upper end of the 29 mm Sapien 3 but should be acceptable. His abdominal and pelvic CT show adequate pelvic arterial anatomy for a transfemoral approach.   The patient and his family were counseled at length regarding treatment alternatives for management of severe symptomatic aortic stenosis. The risks and benefits of surgical intervention has been discussed in detail. Long-term prognosis with medical therapy was discussed. Alternative approaches such as conventional  surgical aortic valve replacement, transcatheter aortic valve replacement, and palliative medical therapy were compared and contrasted at length. This discussion was placed in the context of the patient's own specific clinical presentation and past medical history. All of their questions been addressed. The patient is eager to proceed with surgical management as soon as possible.   Following the decision to proceed with transcatheter aortic valve replacement, a discussion was held regarding what types of management strategies would be attempted intraoperatively in the event of life-threatening complications, including whether or not the patient would be considered a candidate for the use of cardiopulmonary bypass and/or conversion to open sternotomy for attempted surgical intervention. The patient is aware of the fact that transient use of cardiopulmonary bypass may be necessary, but the patient specifically states that she would not wish to undergo redo median sternotomy under any circumstances, even if she were to develop potentially lethal complications related to transcatheter valves appointment.   The patient has been advised of a variety of complications that might develop including but not limited to risks of death, stroke, paravalvular leak, aortic dissection or other major vascular complications, aortic annulus rupture, device embolization, cardiac rupture or perforation, mitral regurgitation, acute myocardial infarction, arrhythmia, heart block or bradycardia requiring permanent pacemaker placement, congestive heart failure,  respiratory failure, renal failure, pneumonia, infection, other late complications related to structural valve deterioration or migration, or other complications that might ultimately cause a temporary or permanent loss of functional independence or other long term morbidity. The patient provides full informed consent for the procedure as described and all questions were answered.     Plan:  The patient will return for a second surgical evaluation with Dr. Roxy Manns and if still felt to be an acceptable candidate he will be scheduled for 06/26/2016.  I spent 40 minutes performing this office visit and > 50% of this time was spent face to face counseling and coordinating the care of this patient's severe symptomatic aortic stenosis.   Gaye Pollack, MD [facility] [phone]

## 2016-06-19 ENCOUNTER — Encounter: Payer: Self-pay | Admitting: Thoracic Surgery (Cardiothoracic Vascular Surgery)

## 2016-06-19 ENCOUNTER — Institutional Professional Consult (permissible substitution) (INDEPENDENT_AMBULATORY_CARE_PROVIDER_SITE_OTHER): Payer: Medicare Other | Admitting: Thoracic Surgery (Cardiothoracic Vascular Surgery)

## 2016-06-19 VITALS — BP 86/47 | HR 52 | Resp 16 | Ht 73.0 in | Wt 197.0 lb

## 2016-06-19 DIAGNOSIS — I25810 Atherosclerosis of coronary artery bypass graft(s) without angina pectoris: Secondary | ICD-10-CM | POA: Diagnosis not present

## 2016-06-19 DIAGNOSIS — I5042 Chronic combined systolic (congestive) and diastolic (congestive) heart failure: Secondary | ICD-10-CM

## 2016-06-19 DIAGNOSIS — I350 Nonrheumatic aortic (valve) stenosis: Secondary | ICD-10-CM

## 2016-06-19 NOTE — Patient Instructions (Addendum)
Patient has been instructed to stop taking EDTA chelation therapy  Patient should continue taking all other medications without change through the day before surgery.  Patient should have nothing to eat or drink after midnight the night before surgery.  On the morning of surgery patient should take only metoprolol and synthroid with a sip of water.

## 2016-06-19 NOTE — Progress Notes (Signed)
[facility]  CARDIOTHORACIC SURGERY CONSULTATION REPORT  Referring Provider is Belva Crome, MD PCP is [name], MD  Chief Complaint  Patient presents with  . Follow-up    2nd TAVR TF eval...consulted with Dr. Cyndia Bent on 06/13/16    HPI:  Patient is an 82 year old male with aortic stenosis, coronary artery disease status post coronary artery bypass grafting 5 by [name] in 1993, chronic combined systolic and diastolic congestive heart failure, hypertension, and hyperlipidemia who has been referred for a second surgical opinion to discuss treatment options for management of severe symptomatic aortic stenosis. The patient's cardiac history dates back within 25 years ago when he underwent coronary artery bypass grafting by [name]. She did well for many years until 2005 when he presented with acute coronary syndrome and ruled in for a non-ST segment elevation myocardial infarction. At that time the vein graft to the diagonal branch was occluded but all remaining bypass grafts were patent. In 2012 he had recurrent symptoms of chest pain and shortness of breath and underwent PCI with placement of a bare metal stent in the vein graft to the right coronary artery. Follow-up catheterization in 2014 revealed 70% in-stent restenosis of the vein graft to the right coronary artery, 50% proximal stenosis in the vein graft to the obtuse marginal branch, chronic occlusion of vein graft to the diagonal branch, and a patent left internal mammary artery graft to the distal left anterior descending coronary artery. Over the years follow-up echocardiograms have demonstrated the presence of mild to moderate left ventricular systolic function and progressive aortic stenosis. Echocardiogram performed in September 2014 revealed he velocity across the aortic valve of 4.1 m/s corresponding to mean transvalvular gradient estimated 37 mmHg. Left  ventricular ejection fraction was 45-50%. He was referred for surgical consultation at that time and evaluating by Dr. Cyndia Bent in March 2015. The patient did not wish to proceed with further workup to consider surgery at that time but subsequently underwent repeat catheterization with PCI and stenting of high-grade stenosis of the vein graft to the obtuse marginal branch. Symptoms improved. The patient has been followed carefully ever since on medical therapy by Dr. Tamala Julian. More recently the patient has experienced worsening symptoms of exertional chest tightness and shortness of breath, occurring with mild activity. He has developed progressive exertional fatigue. In April 2017 he stumbled and fell while he was working in the yard and broke his wrist. This required 2 surgical procedures. Since then the patient states that he has never completely recovered and he has recently been having more exertional chest discomfort. He was seen in follow-up by Dr. Tamala Julian and underwent repeat diagnostic cardiac catheterization on 05/17/2016. This revealed no significant change in his coronary artery disease. Catheterization confirmed the presence of severe aortic stenosis with peak to peak and mean transvalvular gradient measured 35 and 34.3 mmHg, corresponding to aortic valve area calculated 0.79 cm. Follow-up echocardiogram performed 06/07/2016 revealed progression in severity or aortic stenosis with peak velocity across the aortic valve measured 4.0 m/s corresponding to mean transvalvular gradient estimated 41 mmHg and aortic valve area estimated between 0.7 and 0.75 cm with dimensionless velocity ratio 0.17. Left ventricular ejection fraction was estimated 45-50%.  The patient was referred for surgical consultation and evaluated by Dr. Cyndia Bent on 06/13/2016.  The patient was felt to be at relatively high risk and poor candidate for conventional surgical aortic valve replacement via redo median sternotomy with or without redo  coronary  artery bypass grafting. On CT angiography was noted to have anatomical findings suitable for transcatheter aortic valve replacement via transfemoral approach. The patient has been referred for a second surgical opinion.  The patient is married and lives with his wife and brother in [place], [place].  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 He has 3 children and 4 grandchildren, many of home lives nearby.  The patient remains reasonably active physically and is limited primarily only by exertional shortness of breath and fatigue. He describes chest tightness with exertion that occurs regularly and limits him from ordinary activities to some degree. He denies any history of chest tightness at rest or when he is sleeping. He gets short of breath with activity and occasionally at rest. He gets more short of breath lying flat. He has had some swelling in both legs, more so on the left and the right.   Past Medical History:  Diagnosis Date  . Aortic stenosis    severe by echo September 2014  . Arthritis    osteo knees "   . Chronic combined systolic and diastolic HF (heart failure), NYHA class 3 09/09/2013   LVEF 45%.   . Coronary artery disease    with occluded SVG to diagonal and BMS to prox SVG of RCA 10/2010. Recth 12/2012, 70% ISR SVG to RCA, 50% SVG to Cfx, 100% SVG to diagonal, and Patent LIMA with diffuse LAD dz.  . Diverticulosis   . Enlarged prostate    per patient  with mild effect on urine stream  . Heart murmur   . Hyperlipidemia   . Hypertension   . Hypothyroidism   . Insomnia   . Non Hodgkin's lymphoma ([place])   . Non-ST elevation MI (NSTEMI) ([place])   . RBBB   . Vitamin D deficiency     Past Surgical History:  Procedure Laterality Date  . ANKLE SURGERY    . CARDIAC CATHETERIZATION N/A 05/17/2016   Procedure: Right/Left Heart Cath and Coronary/Graft Angiography;  Surgeon: Nelva Bush, MD;  Location:  [facility];  Service: Cardiovascular;  Laterality: N/A;  . CATARACT EXTRACTION W/ INTRAOCULAR LENS  IMPLANT, BILATERAL    . COLONOSCOPY    . CORONARY ARTERY BYPASS GRAFT  1993  . CORONARY STENT PLACEMENT  04/21/2014   bare metal SVG   DR Tamala Julian  . HEMORRHOID SURGERY    . I&D EXTREMITY Left 03/09/2016   Procedure: IRRIGATION AND DEBRIDEMENT WRIST;  Surgeon: Milly Jakob, MD;  Location: [facility];  Service: Orthopedics;  Laterality: Left;  . KNEE ARTHROSCOPY Right 1970'S   . LEFT AND RIGHT HEART CATHETERIZATION WITH CORONARY/GRAFT ANGIOGRAM N/A 04/05/2014   Procedure: LEFT AND RIGHT HEART CATHETERIZATION WITH [name];  Surgeon: Burnell Blanks, MD;  Location: [facility] C[facility] LAB;  Service: Cardiovascular;  Laterality: N/A;  . LEFT HEART CATHETERIZATION WITH CORONARY/GRAFT ANGIOGRAM N/A 01/02/2013   Procedure: LEFT HEART CATHETERIZATION WITH [name];  Surgeon: [name], MD;  Location: [facility] CATH LAB;  Service: Cardiovascular;  Laterality: N/A;  . OPEN REDUCTION INTERNAL FIXATION (ORIF) DISTAL RADIAL FRACTURE Left 02/24/2016   Procedure: OPEN TREATMENT OF LEFT DISTAL RADIUS FRACTURE;  Surgeon: Milly Jakob, MD;  Location: [facility];  Service: Orthopedics;  Laterality: Left;  . PERCUTANEOUS CORONARY STENT INTERVENTION (PCI-S) N/A 04/21/2014   Procedure: PERCUTANEOUS CORONARY STENT INTERVENTION (PCI-S);  Surgeon: [name], MD;  Location: [facility] CATH LAB;  Service: Cardiovascular;  Laterality: N/A;  . Right inguinal hernia repair      Family History  Problem Relation Age of Onset  . Colon cancer Father   . Stroke Mother   . CAD Brother   . Stroke Sister   . Bone cancer Brother   . Parkinson's disease Brother   . Dementia Sister   . Stroke Sister     Social History   Social History  . Marital status: Married    Spouse name: N/A  . Number of children: 3  . Years of education: N/A   Occupational History  . 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 Smoking status: Former Smoker    Packs/day: 0.50    Years: 15.00    Types: Cigarettes  . Smokeless tobacco: Never Used     Comment: quit smoking  in 1962   . Alcohol use No  . Drug use: No  . Sexual activity: Not on file   Other Topics Concern  . Not on file   Social History Narrative  . No narrative on file    Current Outpatient Prescriptions  Medication Sig Dispense Refill  . Ascorbic Acid (VITAMIN C) 1000 MG tablet Take 1,000 mg by mouth every other day.    Marland Kitchen aspirin EC 81 MG tablet Take 81 mg by mouth daily.    Marland Kitchen atorvastatin (LIPITOR) 40 MG tablet Take 40 mg by mouth daily.    . B Complex-C (B-COMPLEX WITH VITAMIN C) tablet Take 1 tablet by mouth 3 (three) times a week.    . CHLOROPHYLL PO Take 2 tablets by mouth daily.    . finasteride (PROSCAR) 5 MG tablet Take 5 mg by mouth daily.  11  . folic acid (FOLVITE) [id number] MCG tablet Take 800 mcg by mouth every other day.    . hydrochlorothiazide (MICROZIDE) 12.5 MG capsule TAKE 1 CAPSULE (12.5 MG TOTAL) BY MOUTH DAILY. 30 capsule 6  . isosorbide mononitrate (IMDUR) 60 MG 24 hr tablet Take one and a half (1.5) tablet (90 mg total) by mouth daily.    Marland Kitchen levothyroxine (SYNTHROID, LEVOTHROID) 125 MCG tablet Take 125 mcg by mouth daily before breakfast.   3  . Magnesium 200 MG TABS Take 200 mg by mouth daily.    . metoprolol tartrate (LOPRESSOR) 25 MG tablet Take 25 mg by mouth daily.    . Multiple Vitamins-Iron (CHLORELLA PO) Take 4 tablets by mouth 2 (two) times daily. MED NAME: CHLORELLA    . NAT-RUL PSYLLIUM SEED HUSKS PO Take 610 mg by mouth daily.     . nitroGLYCERIN (NITROSTAT) 0.4 MG SL tablet Place 1 tablet (0.4 mg total) under the tongue every 5 (five) minutes as needed for chest pain. 25 tablet 11  . Potassium 99 MG TABS Take 99 mg by mouth every other day.     . SELENIUM PO Take 1 tablet by mouth every other day.    . tamsulosin (FLOMAX) 0.4 MG CAPS capsule Take 0.4 mg by mouth 2  (two) times daily.  11   No current facility-administered medications for this visit.     No Known Allergies    Review of Systems:   General:  Normal appetite, decreased energy, no weight gain, no weight loss, no fever  Cardiac:  + chest pain with exertion, no chest pain at rest, + SOB with exertion, occasional resting SOB, no PND, + orthopnea, no palpitations, no arrhythmia, no atrial fibrillation, + LE edema, no dizzy spells,  no syncope  Respiratory:  + shortness of breath, no home oxygen, no productive cough, + dry cough, no bronchitis, no wheezing, no hemoptysis, no asthma, no pain with inspiration or cough, no sleep apnea, no CPAP at night  GI:   no difficulty swallowing, no reflux, no frequent heartburn, no hiatal hernia, no abdominal pain, no constipation, no diarrhea, no hematochezia, no hematemesis, no melena  GU:   no dysuria,  + frequency, no urinary tract infection, no hematuria, + enlarged prostate, no kidney stones, no kidney disease  Vascular:  no pain suggestive of claudication, no pain in feet, no leg cramps, no varicose veins, no DVT, no non-healing foot ulcer  Neuro:   no stroke, no TIA's, no seizures, no headaches, no temporary blindness one eye,  no slurred speech, no peripheral neuropathy, no chronic pain, no instability of gait, + memory/cognitive dysfunction  Musculoskeletal: + arthritis, no joint swelling, no myalgias, no difficulty walking, normal mobility   Skin:   no rash, no itching, no skin infections, no pressure sores or ulcerations  Psych:   no anxiety, no depression, no nervousness, no unusual recent stress  Eyes:   + blurry vision, no floaters, no recent vision changes, + wears glasses or contacts  ENT:   + hearing loss, edentulous w/ upper plate dentures, last saw dentist many years ago  Hematologic:  + easy bruising, no abnormal bleeding, no clotting disorder, no frequent epistaxis  Endocrine:  no diabetes, does not check CBG's at  home           Physical Exam:   BP (!) 86/47   Pulse (!) 52   Resp 16   Ht 6\' 1"  (1.854 m)   Wt 197 lb (89.4 kg)   SpO2 97% Comment: ON RA  BMI 25.99 kg/m   General:  Elderly male NAD   HEENT:  Unremarkable   Neck:   no JVD, no bruits, no adenopathy   Chest:   clear to auscultation, symmetrical breath sounds, no wheezes, no rhonchi   CV:   RRR, grade III/VI crescendo/decrescendo murmur heard best at RUSB,  no diastolic murmur  Abdomen:  soft, non-tender, no masses   Extremities:  warm, well-perfused, pulses diminished, + LE edema L>R  Rectal/GU  Deferred  Neuro:   Grossly non-focal and symmetrical throughout  Skin:   Clean and dry, no rashes, no breakdown   Diagnostic Tests:  Transthoracic Echocardiography  Patient:    Tim, Campbell MR #:       [id number] Study Date: 06/07/2016 Gender:     M Age:        39 Height:     177.8 cm Weight:     96.6 kg BSA:        2.21 m^2 Pt. Status: Room:   ATTENDING    Loralie Champagne, M.D.  SONOGRAPHER  Wyatt Mage, RDCS  ORDERING     Murdock, Christopher  REFERRING    McAlhany, Christopher  PERFORMING   [facility], Outpatient  cc:  ------------------------------------------------------------------- LV EF: 45% -   50%  ------------------------------------------------------------------- Indications:      Aortic stenosis (I35).  ------------------------------------------------------------------- History:   PMH:   Coronary artery disease.  Congestive heart failure.  Aortic valve disease.  PMH:   Myocardial infarction. Risk factors:  Former tobacco use. Hypertension. Dyslipidemia.  ------------------------------------------------------------------- Study Conclusions  - Left ventricle: The cavity size was mildly dilated. Wall   thickness was normal. Basal to mid inferior and basal to mid   inferolateral severe hypokinesis.  Systolic function was mildly   reduced. The estimated ejection fraction was in the range of  45%   to 50%. Doppler parameters are consistent with abnormal left   ventricular relaxation (grade 1 diastolic dysfunction). - Aortic valve: Trileaflet; severely calcified leaflets. There was   severe stenosis. There was mild to moderate regurgitation. Mean   gradient (S): 41 mm Hg. Valve area (VTI): 0.75 cm^2. - Aorta: Dilated aortic root. Aortic root dimension: 42 mm (ED). - Mitral valve: Mildly calcified annulus. There was mild   regurgitation. - Left atrium: The atrium was moderately dilated. - Right ventricle: The cavity size was normal. Systolic function   was mildly reduced. - Right atrium: The atrium was mildly dilated. - Tricuspid valve: Peak RV-RA gradient (S): 23 mm Hg. - Pulmonary arteries: PA peak pressure: 26 mm Hg (S). - Inferior vena cava: The vessel was normal in size. The   respirophasic diameter changes were in the normal range (>= 50%),   consistent with normal central venous pressure.  Impressions:  - Mildly dilated LV with EF 45-50%. Wall motion abnormalities as   noted above. Normal RV size with mildly decreased systolic   function. Severe aortic stenosis with mild to moderate aortic   insufficiency. Mild mitral regurgitation.  ------------------------------------------------------------------- Labs, prior tests, procedures, and surgery: Transthoracic echocardiography (07/07/2015).    The aortic valve showed mild regurgitation.  EF was 50%. Aortic valve: peak gradient of 61 mm Hg and mean gradient of 33 mm Hg.  ------------------------------------------------------------------- Study data:  Comparison was made to the study of 07/07/2015.  Study status:  Routine.  Procedure:  The patient reported no pain pre or post test. Transthoracic echocardiography. Image quality was adequate.  Study completion:  There were no complications. Transthoracic echocardiography.  M-mode, complete 2D, spectral Doppler, and color Doppler.  Birthdate:  Patient  birthdate: [birth year].  Age:  Patient is 82 yr old.  Sex:  Gender: male. BMI: 30.6 kg/m^2.  Blood pressure:     110/50  Patient status: Outpatient.  Study date:  Study date: 06/07/2016. Study time: 11:39 AM.  Location:  [facility]  -------------------------------------------------------------------  ------------------------------------------------------------------- Left ventricle:  The cavity size was mildly dilated. Wall thickness was normal. Basal to mid inferior and basal to mid inferolateral severe hypokinesis. Systolic function was mildly reduced. The estimated ejection fraction was in the range of 45% to 50%. Doppler parameters are consistent with abnormal left ventricular relaxation (grade 1 diastolic dysfunction).  ------------------------------------------------------------------- Aortic valve:   Trileaflet; severely calcified leaflets.  Doppler:  There was severe stenosis.   There was mild to moderate regurgitation.    VTI ratio of LVOT to aortic valve: 0.18. Valve area (VTI): 0.75 cm^2. Indexed valve area (VTI): 0.34 cm^2/m^2. Peak velocity ratio of LVOT to aortic valve: 0.17. Valve area (Vmax): 0.7 cm^2. Indexed valve area (Vmax): 0.32 cm^2/m^2. Mean velocity ratio of LVOT to aortic valve: 0.15. Valve area (Vmean): 0.64 cm^2. Indexed valve area (Vmean): 0.29 cm^2/m^2.    Mean gradient (S): 41 mm Hg. Peak gradient (S): 62 mm Hg.  ------------------------------------------------------------------- Aorta:  Dilated aortic root.  ------------------------------------------------------------------- Mitral valve:   Mildly calcified annulus.  Doppler:   There was no evidence for stenosis.   There was mild regurgitation.    Peak gradient (D): 2 mm Hg.  ------------------------------------------------------------------- Left atrium:  The atrium was moderately dilated.  ------------------------------------------------------------------- Right ventricle:  The  cavity size was normal. Systolic function was mildly reduced.  ------------------------------------------------------------------- Pulmonic valve:    Structurally normal valve.  Cusp separation was normal.  Doppler:  Transvalvular velocity was within the normal range. There was trivial regurgitation.  ------------------------------------------------------------------- Tricuspid valve:   Doppler:  There was trivial regurgitation.   ------------------------------------------------------------------- Right atrium:  The atrium was mildly dilated.  ------------------------------------------------------------------- Pericardium:  There was no pericardial effusion.  ------------------------------------------------------------------- Systemic veins: Inferior vena cava: The vessel was normal in size. The respirophasic diameter changes were in the normal range (>= 50%), consistent with normal central venous pressure.  ------------------------------------------------------------------- Post procedure conclusions Ascending Aorta:  - Dilated aortic root.  ------------------------------------------------------------------- Measurements   Left ventricle                            Value          Reference  LV ID, ED, PLAX chordal           (H)     59.5  mm       43 - 52  LV ID, ES, PLAX chordal           (H)     52.4  mm       23 - 38  LV fx shortening, PLAX chordal    (L)     12    %        >=29  LV PW thickness, ED                       11.7  mm       ---------  IVS/LV PW ratio, ED                       0.97           <=1.3  Stroke volume, 2D                         82    ml       ---------  Stroke volume/bsa, 2D                     37    ml/m^2   ---------  LV e&', lateral                            10    cm/s     ---------  LV E/e&', lateral                          7.25           ---------  LV e&', medial                             6.27  cm/s     ---------  LV E/e&', medial                            11.56          ---------  LV e&', average                            8.14  cm/s     ---------  LV E/e&', average  8.91           ---------    Ventricular septum                        Value          Reference  IVS thickness, ED                         11.4  mm       ---------    LVOT                                      Value          Reference  LVOT ID, S                                23    mm       ---------  LVOT area                                 4.15  cm^2     ---------  LVOT peak velocity, S                     65.8  cm/s     ---------  LVOT mean velocity, S                     44.7  cm/s     ---------  LVOT VTI, S                               19.7  cm       ---------    Aortic valve                              Value          Reference  Aortic valve peak velocity, S             391   cm/s     ---------  Aortic valve mean velocity, S             291   cm/s     ---------  Aortic valve VTI, S                       109   cm       ---------  Aortic mean gradient, S                   38    mm Hg    ---------  Aortic peak gradient, S                   61    mm Hg    ---------  VTI ratio, LVOT/AV                        0.18           ---------  Aortic valve area, VTI  0.75  cm^2     ---------  Aortic valve area/bsa, VTI                0.34  cm^2/m^2 ---------  Velocity ratio, peak, LVOT/AV             0.17           ---------  Aortic valve area, peak velocity          0.7   cm^2     ---------  Aortic valve area/bsa, peak               0.32  cm^2/m^2 ---------  velocity  Velocity ratio, mean, LVOT/AV             0.15           ---------  Aortic valve area, mean velocity          0.64  cm^2     ---------  Aortic valve area/bsa, mean               0.29  cm^2/m^2 ---------  velocity  Aortic regurg pressure half-time          596   ms       ---------    Aorta                                     Value           Reference  Aortic root ID, ED                        42    mm       ---------  Ascending aorta ID, A-P, S                38    mm       ---------    Left atrium                               Value          Reference  LA ID, A-P, ES                            65    mm       ---------  LA ID/bsa, A-P                    (H)     2.94  cm/m^2   <=2.2  LA volume, S                              108   ml       ---------  LA volume/bsa, S                          48.9  ml/m^2   ---------  LA volume, ES, 1-p A4C                    86    ml       ---------  LA volume/bsa, ES, 1-p A4C                38.9  ml/m^2   ---------  LA volume, ES, 1-p A2C                    123   ml       ---------  LA volume/bsa, ES, 1-p A2C                55.7  ml/m^2   ---------    Mitral valve                              Value          Reference  Mitral E-wave peak velocity               72.5  cm/s     ---------  Mitral A-wave peak velocity               78.3  cm/s     ---------  Mitral deceleration time          (H)     274   ms       150 - 230  Mitral peak gradient, D                   2     mm Hg    ---------  Mitral E/A ratio, peak                    0.9            ---------    Pulmonary arteries                        Value          Reference  PA pressure, S, DP                        26    mm Hg    <=30    Tricuspid valve                           Value          Reference  Tricuspid regurg peak velocity            239   cm/s     ---------  Tricuspid peak RV-RA gradient             23    mm Hg    ---------    Right ventricle                           Value          Reference  RV s&', lateral, S                         9.21  cm/s     ---------    Pulmonic valve                            Value          Reference  Pulmonic regurg velocity, ED              97.9  cm/s     ---------  Legend: (L)  and  (H)  mark values outside specified reference  range.  ------------------------------------------------------------------- Prepared and Electronically Authenticated by  Loralie Champagne, M.D. [id number]:26:39   Right/Left Heart Cath and Coronary/Graft Angiography  Conclusion     Ost Cx lesion, 99 %stenosed.  Mid Cx to Dist Cx lesion, 100 %stenosed.  LM lesion, 50 %stenosed.  Ost LAD to Prox LAD lesion, 70 %stenosed.  Prox LAD lesion, 100 %stenosed.  LIMA graft was visualized by angiography and is normal in caliber and anatomically normal.  Mid LAD to Dist LAD lesion, 60 %stenosed.  SVG graft was visualized by angiography.  Prox Graft lesion, 100 %stenosed.  SVG graft was visualized by angiography and is normal in caliber.  The graft exhibits minimal luminal irregularities.  Origin to Prox Graft lesion, 0 %stenosed.  Mid RCA lesion, 100 %stenosed.  RPDA lesion, 95 %stenosed.  Dist RCA lesion, 100 %stenosed.  LV end diastolic pressure is moderately elevated.  There is moderate aortic valve stenosis.  Origin to Prox Graft lesion, 50 %stenosed.  Ost 1st Mrg lesion, 100 %stenosed.   1.  Severe native coronary artery disease, including total occlusions of mid LAD, distal LCx, and mid RCA, as well as 99% ostial LCx stenosis. 2.  Widely patent LIMA to LAD and SVG to OM. 3.  Patent SVG to PDA and PL with stable 50% ISR at ostium of SVG. Stable compared to prior angiogram from 2015. 4.  Chronically occluded SVG to diagonal. 5.  Normal right heart filling pressures. 6.  Mildly to moderately elevated left ventricular filling pressure. 7.  Moderate to severe aortic stenosis.  Plan: 1.  Continue medical therapy of coronary artery disease, as overall appearance is similar to prior catheterization in 2015. 2.  Patient continues to decline TAVR; would benefit from referral back to TAVR team if he decides to proceed with valve intervention in the future.   Indications   Calcific aortic stenosis [I35.0  (ICD-10-CM)]  Coronary artery disease involving coronary bypass graft with unspecified angina pectoris [I25.709 (ICD-10-CM)]  Procedural Details/Technique   Technical Details Indication: 82 year old man with history of CAD s/p remote CABG and subsequent PCI's to SVG's to OM and RCA, severe aortic stenosis, systolic and diastolic heart failure, and hypertension, who presents for further evaluation of progressive exertional chest pain, which is now present with mild activity (CCS class III).  Procedure: The risks, benefits, complications, treatment options, and expected outcomes were discussed with the patient. The patient and/or family concurred with the proposed plan, giving informed consent. The patient was brought to the cath lab after IV hydration was begun and oral premedication was given. The patient was further sedated with Versed and Fentanyl. The right groin was prepped and draped in the usual manner. Using the modified Seldinger access technique, a 5 French sheath was placed in the right femoral artery; a 7 French sheath was placed in the right femoral vein.  Right heart catheterization was performed by advancing a 7 French balloon tipped catheter through the right heart chambers into the pulmonary capillary wedge position. Right heart pressures, oxygen saturations, and thermodilution measurements were obtained.  Selective coronary angiography was performed using 5 [place] JL4 and JR4 catheters to engage the left and right coronary arteries, respectively. Saphenous vein bypass graft angiography was performed using the 5 [place] JR4 catheter. LIMA bypass graft angiography was performed with a 5 [place] IM catheter.  Left heart catheterization was performed using a 5 Fench JR4 catheter across the aortic valve using a 0.035" straight wire. Heparin 2,000 units were administered before attempting to cross  the valve. Aortic valve gradient was obtained with a pullback. Left ventriculogram was not  performed.  There were no immediate complications. The patient was taken to the recovery area in stable condition.    Estimated blood loss <50 mL. .    Coronary Findings   Dominance: Right  Left Main  LM lesion, 50% stenosed. The lesion is calcified.  Left Anterior Descending  Ost LAD to Prox LAD lesion, 70% stenosed. The lesion is eccentric. The lesion is severely calcified.  Prox LAD lesion, 100% stenosed. The lesion is chronically occluded.  Mid LAD to Dist LAD lesion, 60% stenosed.  First Diagonal Branch  Vessel is moderate in size.  Second Diagonal Branch  Vessel is small in size.  Third Diagonal Branch  Vessel is small in size.  Left Circumflex  Vessel is moderate in size.  Ost Cx lesion, 99% stenosed. The lesion is discrete.  Mid Cx to Dist Cx lesion, 100% stenosed.  First Obtuse Marginal Branch  Ost 1st Mrg lesion, 100% stenosed.  Second Obtuse Marginal Branch  Vessel is moderate in size. There is moderate disease in the vessel.  Right Coronary Artery  Mid RCA lesion, 100% stenosed. The lesion is chronically occluded.  Dist RCA lesion, 100% stenosed.  Right Posterior Descending Artery  Vessel is small in size.  RPDA lesion, 95% stenosed. The lesion is discrete.  Graft Angiography  Free LIMA Graft to Mid LAD  LIMA graft was visualized by angiography and is normal in caliber and anatomically normal.  saphenous Graft to 1st Diag  SVG graft was visualized by angiography. Graft chronically occluded at ostium.  Prox Graft lesion, 100% stenosed.  saphenous Graft to 1st Mrg  SVG graft was visualized by angiography and is normal in caliber. The graft exhibits minimal luminal irregularities.  Origin to Prox Graft lesion, 0% stenosed. Previously placed Origin to Prox Graft bare metal stent is widely patent.  Sequential Graft to Ost RPDA, Dist Cx  Origin to Prox Graft lesion before Ost RPDA, 50% stenosed. The lesion was previously treated using a bare metal stent over 2  years ago. Stent protrudes into aorta, precluding complete engagement.  Right Heart   Right Heart Pressures Elevated LV EDP consistent with volume overload.    Right Atrium Right atrial pressure is normal.    Left Heart   Left Ventricle LV end diastolic pressure is moderately elevated.    Aortic Valve There is moderate aortic valve stenosis. The aortic valve is calcified.    Coronary Diagrams   Diagnostic Diagram     Implants     No implant documentation for this case.  PACS Images   Show images for Cardiac catheterization   Link to Procedure Log   Procedure Log    Hemo Data   Flowsheet Row Most Recent Value  Fick Cardiac Output 6.23 L/min  Fick Cardiac Output Index 2.88 (L/min)/BSA  Thermal Cardiac Output 4.84 L/min  Thermal Cardiac Output Index 2.24 (L/min)/BSA  Aortic Mean Gradient 34.3 mmHg  Aortic Peak Gradient 35 mmHg  Aortic Valve Area 0.79  Aortic Value Area Index 0.36 cm2/BSA  RA A Wave 9 mmHg  RA V Wave 8 mmHg  RA Mean 7 mmHg  RV Systolic Pressure 28 mmHg  RV Diastolic Pressure 0 mmHg  RV EDP 8 mmHg  PA Systolic Pressure 28 mmHg  PA Diastolic Pressure 10 mmHg  PA Mean 18 mmHg  PW A Wave 16 mmHg  PW V Wave 16 mmHg  PW Mean 15 mmHg  AO Systolic Pressure [id number] mmHg  AO Diastolic Pressure 56 mmHg  AO Mean 87 mmHg  LV Systolic Pressure 99991111 mmHg  LV Diastolic Pressure 16 mmHg  LV EDP 29 mmHg  Arterial Occlusion Pressure Extended Systolic Pressure [id number] mmHg  Arterial Occlusion Pressure Extended Diastolic Pressure 70 mmHg  Arterial Occlusion Pressure Extended Mean Pressure 105 mmHg  Left Ventricular Apex Extended Systolic Pressure 123XX123 mmHg  Left Ventricular Apex Extended Diastolic Pressure 18 mmHg  Left Ventricular Apex Extended EDP Pressure 31 mmHg  TPVR Index 8.04 HRUI  TSVR Index 38.83 HRUI  PVR SVR Ratio 0.03  TPVR/TSVR Ratio 0.21    Cardiac TAVR CT  TECHNIQUE: The patient was scanned on a Philips 256 scanner. A 120 kV retrospective scan  was triggered in the descending thoracic aorta at 111 HU's. Gantry rotation speed was 270 msecs and collimation was .9 mm. No beta blockade or nitro were given. The 3D data set was reconstructed in 5% intervals of the R-R cycle. Systolic and diastolic phases were analyzed on a dedicated work station using MPR, MIP and VRT modes. The patient received 80 cc of contrast.  FINDINGS: Aortic Valve: Trileaflet, severely thickened and calcified aortic valve with severely restricted leaflet opening. There are mildl calcifications extending into the LVOT predominantly under the left coronary cusp.  Aorta: Aortic root is mildly dilated measuring 96mm in its widest diameter, ascending aortic size is at upper normal limit measuring 40 mm. There are moderate diffuse calcifications and no dissection.  Sinotubular Junction:  35 x 35 mm  Ascending Thoracic Aorta:  40 x 38 mm  Aortic Arch:  32 x 29 mm  Descending Thoracic Aorta:  31 x 28 mm  Sinus of Valsalva is mildly dilated with measurements:  Non-coronary:  41 mm  Right -coronary:  41 mm  Left -coronary:  44 mm  Coronary Artery Height above Annulus:  Left Main:  12 mm  Right Coronary:  19 mm  Virtual Basal Annulus Measurements:  Maximum / Minimum Diameter:  34 x 25 mm  Perimeter:  111 mm  Area:  658 mm2  Optimum Fluoroscopic Angle for Delivery:  RAO 8 CRA 4  IMPRESSION: 1. Trileaflet, severely thickened and calcified aortic valve with severely restricted leaflet opening and mild calcification sin the LVOT. Annular measurements at the higher end of 29 mm Edward-SAPIEN 3 valve but still within acceptable range.  2. Aortic root is mildly dilated and ascending aorta is at the upper normal limit.  3. Sufficient annulus to coronary distance.  4. Optimum Fluoroscopic Angle for Delivery:  RAO 8 CRA 4  Tim Campbell   Electronically Signed   By: Tim Campbell   On: 06/06/2016 14:17    CT  ANGIOGRAPHY CHEST, ABDOMEN AND PELVIS  TECHNIQUE: Multidetector CT imaging through the chest, abdomen and pelvis was performed using the standard protocol during bolus administration of intravenous contrast. Multiplanar reconstructed images and MIPs were obtained and reviewed to evaluate the vascular anatomy.  CONTRAST:  150 mL of Isovue-300.  COMPARISON:  PET-CT 06/17/2006  FINDINGS: CTA CHEST FINDINGS  Cardiovascular: Heart size is enlarged with left ventricular and left atrial dilatation. There is no significant pericardial fluid, thickening or pericardial calcification. There is aortic atherosclerosis, as well as atherosclerosis of the great vessels of the mediastinum and the coronary arteries, including calcified atherosclerotic plaque in the left main, left anterior descending, left circumflex and right coronary arteries. Status post median sternotomy for CABG, including LIMA to the LAD. Extremely severe calcification of the  aortic valve.  Mediastinum/Nodes: No pathologically enlarged mediastinal or hilar lymph nodes. Esophagus is unremarkable in appearance. No axillary lymphadenopathy.  Lungs/Pleura: 11 x 9 mm (mean diameter of 10 mm) pulmonary nodule in the medial aspect of the left lower lobe (image 72 of series 407). 6 mm pulmonary nodule in the inferior segment of the lingula (image 70 of series 407). No acute consolidative airspace disease. No pleural effusions.  Musculoskeletal: Median sternotomy wires. There are no aggressive appearing lytic or blastic lesions noted in the visualized portions of the skeleton. Multiple old healed inferior left-sided rib fractures are incidentally noted.  CTA ABDOMEN AND PELVIS FINDINGS  Hepatobiliary: Small calcified granuloma in segment 4A. No other suspicious hepatic lesions. No intra or extrahepatic biliary ductal dilatation. Gallbladder is normal in appearance.  Pancreas: No pancreatic mass. No pancreatic  ductal dilatation. No pancreatic or peripancreatic fluid or inflammatory changes.  Spleen: Unremarkable.  Adrenals/Urinary Tract: Multiple low-attenuation lesions in the kidneys bilaterally, too small to characterize, but statistically likely cysts. 2.1 cm simple cyst in the posterior aspect of the interpolar region of the right kidney. No suspicious renal lesions. No hydroureteronephrosis. Bilateral adrenal glands are normal in appearance. Urinary bladder is heavily trabeculated, with multiple tiny bladder wall diverticulae.  Stomach/Bowel: The appearance of the stomach is normal. No pathologic dilatation of small bowel or colon. Numerous colonic diverticulae are noted, particularly in the descending colon and sigmoid colon, without surrounding inflammatory changes to suggest an acute diverticulitis at this time. The appendix is not confidently identified and may be surgically absent. Regardless, there are no inflammatory changes noted adjacent to the cecum to suggest the presence of an acute appendicitis at this time.  Vascular/Lymphatic: Aortic atherosclerosis, with vascular findings and measurements pertinent to potential TAVR procedure, as detailed below. In addition, there is short segment fusiform aneurysmal dilatation of the infrarenal abdominal aorta which measures up to 3.3 x 3.0 cm. There is also aneurysmal dilatation of the common iliac arteries bilaterally measuring up to 2 cm on the right and 2.4 cm on the left. No lymphadenopathy noted in the abdomen or pelvis.  Reproductive: Prostate gland is enlarged measuring 5.3 x 5.4 x 5.5 cm. Seminal vesicles are unremarkable in appearance.  Other: No significant volume of ascites.  No pneumoperitoneum.  Musculoskeletal: There are no aggressive appearing lytic or blastic lesions noted in the visualized portions of the skeleton.  VASCULAR MEASUREMENTS PERTINENT TO TAVR:  AORTA:  Minimal Aortic Diameter -  17 x  18 mm  Severity of Aortic Calcification -  severe  RIGHT PELVIS:  Right Common Iliac Artery -  Minimal Diameter - 11.5 x 8.0 mm  Tortuosity - mild  Calcification - moderate to severe  Right External Iliac Artery -  Minimal Diameter - 9.5 x 10.0 mm  Tortuosity - moderate to severe  Calcification - minimal  Right Common Femoral Artery -  Minimal Diameter - 10.6 x 5.9 mm  Tortuosity - mild  Calcification - moderate  LEFT PELVIS:  Left Common Iliac Artery -  Minimal Diameter - 9.3 x 9.2 mm  Tortuosity - moderate  Calcification - moderate to severe  Left External Iliac Artery -  Minimal Diameter - 11.1 x 10.1 mm  Tortuosity - moderate to severe  Calcification - minimal  Left Common Femoral Artery -  Minimal Diameter - 11.1 x 6.1 mm  Tortuosity - mild  Calcification - mild to moderate  Review of the MIP images confirms the above findings.  IMPRESSION: 1. Vascular findings and measurements  pertinent to potential TAVR procedure, as detailed above. Patient has suitable pelvic arterial access bilaterally. 2. Extremely severe calcifications of the aortic valve, compatible with the reported clinical history of severe aortic stenosis. 3. **An incidental finding of potential clinical significance has been found. Two pulmonary nodules are noted in the left lung, the largest of which has a mean diameter of 1 cm. Non-contrast chest CT at 3-6 months is recommended. If the nodules are stable at time of repeat CT, then future CT at 18-24 months (from today's scan) is considered optional for low-risk patients, but is recommended for high-risk patients. This recommendation follows the consensus statement: Guidelines for Management of Incidental Pulmonary Nodules Detected on CT Images:From the Fleischner Society 2017; published online before print (10.1148/radiol.IJ:2314499).** 4. **An incidental finding of potential clinical  significance has been found. Abdominal aortic aneurysm measuring 3.3 x 3.0 cm. Recommend followup by ultrasound in 3 years. This recommendation follows ACR consensus guidelines: White Paper of the ACR Incidental Findings Committee II on Vascular Findings. J Am Coll Radiol 2013; 10:789-794. ** 5. Colonic diverticulosis without evidence of acute diverticulitis at this time. 6. Additional incidental findings, as above.   Electronically Signed   By: Vinnie Langton M.D.   On: 06/06/2016 13:23   STS Risk Calculator  Procedure    Redo CABG + AVR  Risk of Mortality   7.0% Morbidity or Mortality  34.6% Prolonged LOS   16.2% Short LOS    13.3% Permanent Stroke   2.8% Prolonged Vent Support  21.3% DSW Infection    0.6% Renal Failure    11.6% Reoperation    12.2%   Impression:  Patient has stage D severe symptomatic aortic stenosis and multivessel coronary artery disease status post coronary artery bypass grafting in the distant past. He presents with progressive symptoms of exertional chest discomfort, shortness of breath, and fatigue consistent with a combination of angina pectoris and chronic combined systolic and diastolic congestive heart failure, [place] Heart Association functional class III. I have personally reviewed the patient's recent transthoracic echocardiogram and diagnostic cardiac catheterization. Echocardiogram confirms the presence of severe aortic stenosis with severely restricted leaflet mobility and heavy calcification involving all 3 leaflets of the patient's aortic valve.  Peak velocity across the aortic valve measures greater than 4.0 m/s corresponding to mean transvalvular gradient estimated 41 mmHg. Severe aortic stenosis was confirmed at the time of diagnostic cardiac catheterization. The patient also has severe three-vessel coronary artery disease but continued patency of the left internal mammary artery graft to the distal left anterior descending coronary  artery and the vein grafts to both the obtuse marginal branch and the right coronary system.  There has been little change in the appearance of the patient's native coronary circulation and vein graft disease over the past 2 years since the patient's last previous catheterization. Left ventricular function is stable but mild to moderately reduced. I agree the patient needs aortic valve replacement. Risks associated with aortic valve replacement performed using conventional surgical techniques through redo median sternotomy with or without redo coronary artery bypass grafting would unquestionably be high in this elderly gentleman with numerous comorbid medical problems. I have personally reviewed the patient's recent CT angiograms. Cardiac gated CT angiogram of the heart confirmed the presence of findings consistent with severe aortic stenosis without any significant complicating features. CT angiogram of the chest, abdomen and pelvis demonstrates adequate pelvic vascular access to facilitate a transfemoral approach for transcatheter aortic valve replacement.  I agree the patient would best  be treated with transcatheter aortic valve replacement. I would be very reluctant to consider this elderly gentleman candidate for conventional surgery.   Plan:  The patient and his family were counseled at length regarding treatment alternatives for management of severe symptomatic aortic stenosis. Alternative approaches such as conventional aortic valve replacement, transcatheter aortic valve replacement, and palliative medical therapy were compared and contrasted at length.  The risks associated with conventional surgical aortic valve replacement were been discussed in detail, as were expectations for post-operative convalescence. Long-term prognosis with medical therapy was discussed. This discussion was placed in the context of the patient's own specific clinical presentation and past medical history.  All of their  questions been addressed.  The patient would not wish to be considered a candidate for conventional surgery under any circumstances and he hopes to proceed with transcatheter aortic valve replacement in the near future.  Following the decision to proceed with transcatheter aortic valve replacement, a discussion has been held regarding what types of management strategies would be attempted intraoperatively in the event of life-threatening complications, including whether or not the patient would be considered a candidate for the use of cardiopulmonary bypass and/or conversion to open sternotomy for attempted surgical intervention.  The patient has been advised of a variety of complications that might develop including but not limited to risks of death, stroke, paravalvular leak, aortic dissection or other major vascular complications, aortic annulus rupture, device embolization, cardiac rupture or perforation, mitral regurgitation, acute myocardial infarction, arrhythmia, heart block or bradycardia requiring permanent pacemaker placement, congestive heart failure, respiratory failure, renal failure, pneumonia, infection, other late complications related to structural valve deterioration or migration, or other complications that might ultimately cause a temporary or permanent loss of functional independence or other long term morbidity.  The patient provides full informed consent for the procedure as described and all questions were answered.  The patient has been scheduled for transcatheter aortic valve replacement via transfemoral approach on Tuesday, 06/26/2016. He has been advised to stop taking EDTA chelation therapy indefinitely.     Valentina Gu. Roxy Manns, MD 06/19/2016 2:49 PM

## 2016-06-21 NOTE — Pre-Procedure Instructions (Addendum)
Tim Campbell  06/21/2016      [place], [place] - [name] [address] Phone: [phone] Fax: [phone]  [id number] - [place], [address] [place] [address] Phone: [phone] Fax: [phone]    Your procedure is scheduled on Tuesday September 19.  Report to [facility] Admitting at 8:00 A.M.  Call this number if you have problems the morning of surgery:  [phone]   Remember:  Do not eat food or drink liquids after midnight.  take these medicines the morning of surgery with A SIP OF WATER: finasteride,isosorbide(imdur)levothyroxine (synthroid), tamsulosin (flomax) nitro if needed  7 days prior to surgery STOP taking any Aleve, Naproxen, Ibuprofen, Motrin, Advil, Goody's, BC's, all herbal medications, fish oil, and all vitamins    Do not wear jewelry, make-up or nail polish.  Do not wear lotions, powders, or perfumes, or deoderant.  Do not shave 48 hours prior to surgery.  Men may shave face and neck.  Do not bring valuables to the hospital.  [facility] is not responsible for any belongings or valuables.  Contacts, dentures or bridgework may not be worn into surgery.  Leave your suitcase in the car.  After surgery it may be brought to your room.  For patients admitted to the hospital, discharge time will be determined by your treatment team.  Patients discharged the day of surgery will not be allowed to drive home.    Special instructions:    [place]- Preparing For Surgery  Before surgery, you can play an important role. Because skin is not sterile, your skin needs to be as free of germs as possible. You can reduce the number of germs on your skin by washing with CHG (chlorahexidine gluconate) Soap before surgery.  CHG is an antiseptic cleaner which kills germs and bonds with the skin to  continue killing germs even after washing.  Please do not use if you have an allergy to CHG or antibacterial soaps. If your skin becomes reddened/irritated stop using the CHG.  Do not shave (including legs and underarms) for at least 48 hours prior to first CHG shower. It is OK to shave your face.  Please follow these instructions carefully.   1. Shower the NIGHT BEFORE SURGERY and the MORNING OF SURGERY with CHG.   2. If you chose to wash your hair, wash your hair first as usual with your normal shampoo.  3. After you shampoo, rinse your hair and body thoroughly to remove the shampoo.  4. Use CHG as you would any other liquid soap. You can apply CHG directly to the skin and wash gently with a scrungie or a clean washcloth.   5. Apply the CHG Soap to your body ONLY FROM THE NECK DOWN.  Do not use on open wounds or open sores. Avoid contact with your eyes, ears, mouth and genitals (private parts). Wash genitals (private parts) with your normal soap.  6. Wash thoroughly, paying special attention to the area where your surgery will be performed.  7. Thoroughly rinse your body with warm water from the neck down.  8. DO NOT shower/wash with your normal soap after using and rinsing off the CHG Soap.  9. Pat yourself dry with a CLEAN TOWEL.   10. Wear CLEAN PAJAMAS   11. Place CLEAN SHEETS on your  bed the night of your first shower and DO NOT SLEEP WITH PETS.    Day of Surgery: Do not apply any deodorants/lotions. Please wear clean clothes to the hospital/surgery center.      Please read over the  fact sheets that you were given.

## 2016-06-22 ENCOUNTER — Ambulatory Visit (HOSPITAL_COMMUNITY)
Admission: RE | Admit: 2016-06-22 | Discharge: 2016-06-22 | Disposition: A | Discharge status: Home / Self Care | Payer: Medicare Other | Source: Ambulatory Visit | Attending: Cardiovascular Disease | Admitting: Cardiovascular Disease

## 2016-06-22 ENCOUNTER — Encounter (HOSPITAL_COMMUNITY)
Admission: RE | Admit: 2016-06-22 | Discharge: 2016-06-22 | Disposition: A | Discharge status: Home / Self Care | Payer: Medicare Other | Source: Ambulatory Visit | Attending: Cardiovascular Disease | Admitting: Cardiovascular Disease

## 2016-06-22 ENCOUNTER — Encounter (HOSPITAL_COMMUNITY): Payer: Self-pay

## 2016-06-22 DIAGNOSIS — I350 Nonrheumatic aortic (valve) stenosis: Secondary | ICD-10-CM | POA: Insufficient documentation

## 2016-06-22 DIAGNOSIS — Z01818 Encounter for other preprocedural examination: Secondary | ICD-10-CM | POA: Diagnosis not present

## 2016-06-22 LAB — CBC
HCT: 41.3 % (ref 39.0–52.0)
Hemoglobin: 13 g/dL (ref 13.0–17.0)
MCH: 26.6 pg (ref 26.0–34.0)
MCHC: 31.5 g/dL (ref 30.0–36.0)
MCV: 84.5 fL (ref 78.0–100.0)
PLATELETS: 136 10*3/uL — AB (ref 150–400)
RBC: 4.89 MIL/uL (ref 4.22–5.81)
RDW: 16.4 % — AB (ref 11.5–15.5)
WBC: 7.3 10*3/uL (ref 4.0–10.5)

## 2016-06-22 LAB — COMPREHENSIVE METABOLIC PANEL
ALT: 13 U/L — ABNORMAL LOW (ref 17–63)
ANION GAP: 6 (ref 5–15)
AST: 21 U/L (ref 15–41)
Albumin: 3.7 g/dL (ref 3.5–5.0)
Alkaline Phosphatase: 54 U/L (ref 38–126)
BUN: 16 mg/dL (ref 6–20)
CHLORIDE: 106 mmol/L (ref 101–111)
CO2: 26 mmol/L (ref 22–32)
Calcium: 9 mg/dL (ref 8.9–10.3)
Creatinine, Ser: 0.81 mg/dL (ref 0.61–1.24)
Glucose, Bld: 100 mg/dL — ABNORMAL HIGH (ref 65–99)
POTASSIUM: 3.7 mmol/L (ref 3.5–5.1)
Sodium: 138 mmol/L (ref 135–145)
TOTAL PROTEIN: 6.1 g/dL — AB (ref 6.5–8.1)
Total Bilirubin: 0.9 mg/dL (ref 0.3–1.2)

## 2016-06-22 LAB — BLOOD GAS, ARTERIAL
ACID-BASE EXCESS: 1.6 mmol/L (ref 0.0–2.0)
BICARBONATE: 25.3 mmol/L (ref 20.0–28.0)
DRAWN BY: 10101
FIO2: 21
O2 SAT: 96.7 %
PCO2 ART: 37.1 mmHg (ref 32.0–48.0)
Patient temperature: 98.6
pH, Arterial: 7.447 (ref 7.350–7.450)
pO2, Arterial: 88.7 mmHg (ref 83.0–108.0)

## 2016-06-22 LAB — URINALYSIS, ROUTINE W REFLEX MICROSCOPIC
BILIRUBIN URINE: NEGATIVE
Glucose, UA: NEGATIVE mg/dL
Hgb urine dipstick: NEGATIVE
KETONES UR: NEGATIVE mg/dL
LEUKOCYTES UA: NEGATIVE
NITRITE: NEGATIVE
PROTEIN: NEGATIVE mg/dL
Specific Gravity, Urine: 1.016 (ref 1.005–1.030)
pH: 5.5 (ref 5.0–8.0)

## 2016-06-22 LAB — ABO/RH: ABO/RH(D): O POS

## 2016-06-22 LAB — PROTIME-INR
INR: 1.19
PROTHROMBIN TIME: 15.2 s (ref 11.4–15.2)

## 2016-06-22 LAB — SURGICAL PCR SCREEN
MRSA, PCR: NEGATIVE
STAPHYLOCOCCUS AUREUS: NEGATIVE

## 2016-06-22 LAB — APTT: aPTT: 33 seconds (ref 24–36)

## 2016-06-23 LAB — HEMOGLOBIN A1C
Hgb A1c MFr Bld: 5.5 % (ref 4.8–5.6)
Mean Plasma Glucose: 111 mg/dL

## 2016-06-25 MED ORDER — DEXMEDETOMIDINE HCL IN NACL 400 MCG/100ML IV SOLN
0.1000 ug/kg/h | INTRAVENOUS | Status: DC
  Filled 2016-06-25: qty 100

## 2016-06-25 MED ORDER — SODIUM CHLORIDE 0.9 % IV SOLN
INTRAVENOUS | Status: DC
  Filled 2016-06-25: qty 2.5

## 2016-06-25 MED ORDER — MAGNESIUM SULFATE 50 % IJ SOLN
40.0000 meq | INTRAMUSCULAR | Status: DC
  Filled 2016-06-25: qty 10

## 2016-06-25 MED ORDER — DOPAMINE-DEXTROSE 3.2-5 MG/ML-% IV SOLN
0.0000 ug/kg/min | INTRAVENOUS | Status: DC
  Filled 2016-06-25: qty 250

## 2016-06-25 MED ORDER — NOREPINEPHRINE BITARTRATE 1 MG/ML IV SOLN
0.0000 ug/min | INTRAVENOUS | Status: AC
  Administered 2016-06-26: 4 ug/min via INTRAVENOUS
  Filled 2016-06-25: qty 4

## 2016-06-25 MED ORDER — NITROGLYCERIN IN D5W 200-5 MCG/ML-% IV SOLN
2.0000 ug/min | INTRAVENOUS | Status: DC
  Filled 2016-06-25: qty 250

## 2016-06-25 MED ORDER — VANCOMYCIN HCL 10 G IV SOLR
1500.0000 mg | INTRAVENOUS | Status: AC
  Administered 2016-06-26: 1500 mg via INTRAVENOUS
  Filled 2016-06-25: qty 1500

## 2016-06-25 MED ORDER — EPINEPHRINE HCL 1 MG/ML IJ SOLN
0.0000 ug/min | INTRAVENOUS | Status: DC
  Filled 2016-06-25: qty 4

## 2016-06-25 MED ORDER — PHENYLEPHRINE HCL 10 MG/ML IJ SOLN
30.0000 ug/min | INTRAVENOUS | Status: DC
  Filled 2016-06-25: qty 2

## 2016-06-25 MED ORDER — POTASSIUM CHLORIDE 2 MEQ/ML IV SOLN
80.0000 meq | INTRAVENOUS | Status: DC
  Filled 2016-06-25: qty 40

## 2016-06-25 MED ORDER — CHLORHEXIDINE GLUCONATE 0.12 % MT SOLN
15.0000 mL | Freq: Once | OROMUCOSAL | Status: AC
  Administered 2016-06-26: 15 mL via OROMUCOSAL
  Filled 2016-06-25: qty 15

## 2016-06-25 MED ORDER — SODIUM CHLORIDE 0.9 % IV SOLN
INTRAVENOUS | Status: DC
  Filled 2016-06-25: qty 30

## 2016-06-25 MED ORDER — SODIUM CHLORIDE 0.9 % IV SOLN: INTRAVENOUS | Status: DC

## 2016-06-25 MED ORDER — CEFUROXIME SODIUM 1.5 G IJ SOLR
1.5000 g | INTRAMUSCULAR | Status: AC
  Administered 2016-06-26: 1.5 g via INTRAVENOUS
  Filled 2016-06-25 (×2): qty 1.5

## 2016-06-25 NOTE — H&P (Signed)
[address]       [place] [address]             [phone]      Cardiothoracic Surgery History and Physical   HPI:  The patient is an 82 year old gentleman who underwent coronary bypass graft surgery x 5 by [name] in 1993. He did well until 2005 when he had recurrent chest pain and ruled in for a NSTEMI. Cath showed recent occlusion of the diagonal vein graft with patent vein grafts to the OM and RCA and a patent LIMA to the LAD. He has subsequently had a BMS to the proximal SVG to the RCA in 10/2010. Cath in March 2014 which showed a 70% ISR of the SVG to the RCA, 50% proximal stenosis of the SVG to the OM, chronic occlusion of the SVG to the Diagonal, and a patent LIMA to the LAD which was diffusely diseased. An echo in September 2014 showed severe AS with a peak velocity of 4.08 m/s, a mean aortic valve gradient of 37 mm Hg, and an AVA of 0.8 cm2. There was mild AI and mild MR with an LVEF of 45-50%. I saw him in March 2015 for consideration of TAVR and he was reporting chest discomfort and shortness of breath with mild to moderate activity. He really did not want any surgery at that time but did undergo repeat cath showing a severe stenosis in the proximal OM SVG. He underwent stenting of this graft in 04/2014 and his exertional chest pain resolved. He was still having some exertional shortness of breath but decreased his activity so that it would not bother him. He did not want surgery so we decided to continue following this. An echo in September 2016showed moderately severe AS with a peak velocity of 3.69m/s, a mean aortic valve gradient of 49mm Hg, and an AVA of 1.03cm2. There was mild AI and mild MR with an LVEF of 45-50%. He recently saw Dr. Tamala Julian for cardiology follow up with complaints of chest pressure and shortness of breath with mild activity as well as constant fatigue. He fell and broke his wrist in April 2017 and had two surgeries on his wrist. He  denies syncope and says that he tripped and fell. He has some ankle edema. He underwent a repeat cath on 05/17/2016 showing no significant change in the appearance of his vessels or grafts compared to his last cath in 2015. His recent echo on 06/07/2016 showed an increase in his mean AV gradient to 41 mm Hg with a DI of 0.17 consistent with severe AS. LVEF was 45-50%        Current Outpatient Prescriptions  Medication Sig Dispense Refill  . Ascorbic Acid (VITAMIN C) 1000 MG tablet Take 1,000 mg by mouth every other day.    Marland Kitchen aspirin EC 81 MG tablet Take 81 mg by mouth daily.    Marland Kitchen atorvastatin (LIPITOR) 40 MG tablet Take 40 mg by mouth daily.    . B Complex-C (B-COMPLEX WITH VITAMIN C) tablet Take 1 tablet by mouth 3 (three) times a week.    . CHLOROPHYLL PO Take 2 tablets by mouth daily.    . finasteride (PROSCAR) 5 MG tablet Take 5 mg by mouth daily.  11  . folic acid (FOLVITE) [id number] MCG tablet Take 800 mcg by mouth every other day.    . hydrochlorothiazide (MICROZIDE) 12.5 MG capsule TAKE 1 CAPSULE (12.5 MG TOTAL) BY MOUTH DAILY.  30 capsule 6  . isosorbide mononitrate (IMDUR) 60 MG 24 hr tablet Take one and a half (1.5) tablet (90 mg total) by mouth daily.    Marland Kitchen levothyroxine (SYNTHROID, LEVOTHROID) 125 MCG tablet Take 125 mcg by mouth daily before breakfast.   3  . Magnesium 200 MG TABS Take 200 mg by mouth daily.    . metoprolol tartrate (LOPRESSOR) 25 MG tablet Take 25 mg by mouth daily.    . Multiple Vitamins-Iron (CHLORELLA PO) Take 4 tablets by mouth 2 (two) times daily. MED NAME: CHLORELLA    . NAT-RUL PSYLLIUM SEED HUSKS PO Take 610 mg by mouth daily.     . nitroGLYCERIN (NITROSTAT) 0.4 MG SL tablet Place 1 tablet (0.4 mg total) under the tongue every 5 (five) minutes as needed for chest pain. 25 tablet 11  . Potassium 99 MG TABS Take 99 mg by mouth every other day.     . SELENIUM PO Take 1 tablet by mouth every other day.    . tamsulosin (FLOMAX) 0.4 MG CAPS  capsule Take 0.4 mg by mouth 2 (two) times daily.  11   No current facility-administered medications for this visit.      Physical Exam: BP (!) 99/54 (BP Location: Left Arm, Patient Position: Sitting, Cuff Size: Normal)   Pulse (!) 50   Resp 20   Ht 5\' 10"  (1.778 m)   Wt 213 lb (96.6 kg)   SpO2 98% Comment: RA  BMI 30.56 kg/m  He looks well Lung exam is clear Cardiac exam shows a regular rate and rhythm with a harsh 3/6 systolic murmur along the RSB. There is mild lower extremity edema bilaterally  Diagnostic Tests:  Physicians   Panel Physicians Referring Physician Case Authorizing Physician  Nelva Bush, MD (Primary)    Belva Crome, MD (Assisting)    Procedures   Right/Left Heart Cath and Coronary/Graft Angiography  Conclusion     Ost Cx lesion, 99 %stenosed.  Mid Cx to Dist Cx lesion, 100 %stenosed.  LM lesion, 50 %stenosed.  Ost LAD to Prox LAD lesion, 70 %stenosed.  Prox LAD lesion, 100 %stenosed.  LIMA graft was visualized by angiography and is normal in caliber and anatomically normal.  Mid LAD to Dist LAD lesion, 60 %stenosed.  SVG graft was visualized by angiography.  Prox Graft lesion, 100 %stenosed.  SVG graft was visualized by angiography and is normal in caliber.  The graft exhibits minimal luminal irregularities.  Origin to Prox Graft lesion, 0 %stenosed.  Mid RCA lesion, 100 %stenosed.  RPDA lesion, 95 %stenosed.  Dist RCA lesion, 100 %stenosed.  LV end diastolic pressure is moderately elevated.  There is moderate aortic valve stenosis.  Origin to Prox Graft lesion, 50 %stenosed.  Ost 1st Mrg lesion, 100 %stenosed.  1. Severe native coronary artery disease, including total occlusions of mid LAD, distal LCx, and mid RCA, as well as 99% ostial LCx stenosis. 2. Widely patent LIMA to LAD and SVG to OM. 3. Patent SVG to PDA and PL with stable 50% ISR at ostium of SVG. Stable compared to prior angiogram from  2015. 4. Chronically occluded SVG to diagonal. 5. Normal right heart filling pressures. 6. Mildly to moderately elevated left ventricular filling pressure. 7. Moderate to severe aortic stenosis.  Plan: 1. Continue medical therapy of coronary artery disease, as overall appearance is similar to prior catheterization in 2015. 2. Patient continues to decline TAVR; would benefit from referral back to TAVR team if he decides to  proceed with valve intervention in the future.   Indications   Calcific aortic stenosis [I35.0 (ICD-10-CM)]  Coronary artery disease involving coronary bypass graft with unspecified angina pectoris [I25.709 (ICD-10-CM)]  Procedural Details/Technique   Technical Details Indication: 82 year old man with history of CAD s/p remote CABG and subsequent PCI's to SVG's to OM and RCA, severe aortic stenosis, systolic and diastolic heart failure, and hypertension, who presents for further evaluation of progressive exertional chest pain, which is now present with mild activity (CCS class III).  Procedure: The risks, benefits, complications, treatment options, and expected outcomes were discussed with the patient. The patient and/or family concurred with the proposed plan, giving informed consent. The patient was brought to the cath lab after IV hydration was begun and oral premedication was given. The patient was further sedated with Versed and Fentanyl. The right groin was prepped and draped in the usual manner. Using the modified Seldinger access technique, a 5 French sheath was placed in the right femoral artery; a 7 French sheath was placed in the right femoral vein.  Right heart catheterization was performed by advancing a 7 French balloon tipped catheter through the right heart chambers into the pulmonary capillary wedge position. Right heart pressures, oxygen saturations, and thermodilution measurements were obtained.  Selective coronary angiography was performed using 5  [place] JL4 and JR4 catheters to engage the left and right coronary arteries, respectively. Saphenous vein bypass graft angiography was performed using the 5 [place] JR4 catheter. LIMA bypass graft angiography was performed with a 5 [place] IM catheter.  Left heart catheterization was performed using a 5 Fench JR4 catheter across the aortic valve using a 0.035" straight wire. Heparin 2,000 units were administered before attempting to cross the valve. Aortic valve gradient was obtained with a pullback. Left ventriculogram was not performed.  There were no immediate complications. The patient was taken to the recovery area in stable condition.    Estimated blood loss <50 mL. .    Coronary Findings   Dominance: Right  Left Main  LM lesion, 50% stenosed. The lesion is calcified.  Left Anterior Descending  Ost LAD to Prox LAD lesion, 70% stenosed. The lesion is eccentric. The lesion is severely calcified.  Prox LAD lesion, 100% stenosed. The lesion is chronically occluded.  Mid LAD to Dist LAD lesion, 60% stenosed.  First Diagonal Branch  Vessel is moderate in size.  Second Diagonal Branch  Vessel is small in size.  Third Diagonal Branch  Vessel is small in size.  Left Circumflex  Vessel is moderate in size.  Ost Cx lesion, 99% stenosed. The lesion is discrete.  Mid Cx to Dist Cx lesion, 100% stenosed.  First Obtuse Marginal Branch  Ost 1st Mrg lesion, 100% stenosed.  Second Obtuse Marginal Branch  Vessel is moderate in size. There is moderate disease in the vessel.  Right Coronary Artery  Mid RCA lesion, 100% stenosed. The lesion is chronically occluded.  Dist RCA lesion, 100% stenosed.  Right Posterior Descending Artery  Vessel is small in size.  RPDA lesion, 95% stenosed. The lesion is discrete.  Graft Angiography  Free LIMA Graft to Mid LAD  LIMA graft was visualized by angiography and is normal in caliber and anatomically normal.  saphenous Graft to 1st Diag  SVG graft was  visualized by angiography. Graft chronically occluded at ostium.  Prox Graft lesion, 100% stenosed.  saphenous Graft to 1st Mrg  SVG graft was visualized by angiography and is normal in caliber. The graft exhibits  minimal luminal irregularities.  Origin to Prox Graft lesion, 0% stenosed. Previously placed Origin to Prox Graft bare metal stent is widely patent.  Sequential Graft to Ost RPDA, Dist Cx  Origin to Prox Graft lesion before Ost RPDA, 50% stenosed. The lesion was previously treated using a bare metal stent over 2 years ago. Stent protrudes into aorta, precluding complete engagement.  Right Heart   Right Heart Pressures Elevated LV EDP consistent with volume overload.    Right Atrium Right atrial pressure is normal.    Left Heart   Left Ventricle LV end diastolic pressure is moderately elevated.    Aortic Valve There is moderate aortic valve stenosis. The aortic valve is calcified.    Coronary Diagrams   Diagnostic Diagram     Implants        No implant documentation for this case.  PACS Images   Show images for Cardiac catheterization   Link to Procedure Log   Procedure Log    Hemo Data   Flowsheet Row Most Recent Value  Fick Cardiac Output 6.23 L/min  Fick Cardiac Output Index 2.88 (L/min)/BSA  Thermal Cardiac Output 4.84 L/min  Thermal Cardiac Output Index 2.24 (L/min)/BSA  Aortic Mean Gradient 34.3 mmHg  Aortic Peak Gradient 35 mmHg  Aortic Valve Area 0.79  Aortic Value Area Index 0.36 cm2/BSA  RA A Wave 9 mmHg  RA V Wave 8 mmHg  RA Mean 7 mmHg  RV Systolic Pressure 28 mmHg  RV Diastolic Pressure 0 mmHg  RV EDP 8 mmHg  PA Systolic Pressure 28 mmHg  PA Diastolic Pressure 10 mmHg  PA Mean 18 mmHg  PW A Wave 16 mmHg  PW V Wave 16 mmHg  PW Mean 15 mmHg  AO Systolic Pressure [id number] mmHg  AO Diastolic Pressure 56 mmHg  AO Mean 87 mmHg  LV Systolic Pressure 99991111 mmHg  LV Diastolic Pressure 16 mmHg  LV EDP 29 mmHg  Arterial Occlusion Pressure  Extended Systolic Pressure [id number] mmHg  Arterial Occlusion Pressure Extended Diastolic Pressure 70 mmHg  Arterial Occlusion Pressure Extended Mean Pressure 105 mmHg  Left Ventricular Apex Extended Systolic Pressure 123XX123 mmHg  Left Ventricular Apex Extended Diastolic Pressure 18 mmHg  Left Ventricular Apex Extended EDP Pressure 31 mmHg  TPVR Index 8.04 HRUI  TSVR Index 38.83 HRUI  PVR SVR Ratio 0.03  TPVR/TSVR Ratio 0.21    *[facility]* [address], [place] [address] [phone]  ------------------------------------------------------------------- Transthoracic Echocardiography  Patient: Arcadio, Crute MR #: [id number] Study Date: 06/07/2016 Gender: M Age: 51 Height: 177.8 cm Weight: 96.6 kg BSA: 2.21 m^2 Pt. Status: Room:  ATTENDING Loralie Champagne, M.D. SONOGRAPHER Wyatt Mage, RDCS ORDERING [place], Christopher REFERRING [place], Christopher PERFORMING [facility], Outpatient  cc:  ------------------------------------------------------------------- LV EF: 45% - 50%  ------------------------------------------------------------------- Indications: Aortic stenosis (I35).  ------------------------------------------------------------------- History: PMH: Coronary artery disease. Congestive heart failure. Aortic valve disease. PMH: Myocardial infarction. Risk factors: Former tobacco use. Hypertension. Dyslipidemia.  ------------------------------------------------------------------- Study Conclusions  - Left ventricle: The cavity size was mildly dilated. Wall thickness was normal. Basal to mid inferior and basal to mid inferolateral severe hypokinesis. Systolic function was mildly reduced. The estimated ejection fraction was in the range of 45% to 50%. Doppler parameters are consistent with  abnormal left ventricular relaxation (grade 1 diastolic dysfunction). - Aortic valve: Trileaflet; severely calcified leaflets. There was severe stenosis. There was mild to moderate regurgitation. Mean gradient (S): 41 mm Hg. Valve area (VTI): 0.75 cm^2. - Aorta: Dilated aortic root.  Aortic root dimension: 42 mm (ED). - Mitral valve: Mildly calcified annulus. There was mild regurgitation. - Left atrium: The atrium was moderately dilated. - Right ventricle: The cavity size was normal. Systolic function was mildly reduced. - Right atrium: The atrium was mildly dilated. - Tricuspid valve: Peak RV-RA gradient (S): 23 mm Hg. - Pulmonary arteries: PA peak pressure: 26 mm Hg (S). - Inferior vena cava: The vessel was normal in size. The respirophasic diameter changes were in the normal range (>= 50%), consistent with normal central venous pressure.  Impressions:  - Mildly dilated LV with EF 45-50%. Wall motion abnormalities as noted above. Normal RV size with mildly decreased systolic function. Severe aortic stenosis with mild to moderate aortic insufficiency. Mild mitral regurgitation.  ------------------------------------------------------------------- Labs, prior tests, procedures, and surgery: Transthoracic echocardiography (07/07/2015). The aortic valve showed mild regurgitation. EF was 50%. Aortic valve: peak gradient of 61 mm Hg and mean gradient of 33 mm Hg.  ------------------------------------------------------------------- Study data: Comparison was made to the study of 07/07/2015. Study status: Routine. Procedure: The patient reported no pain pre or post test. Transthoracic echocardiography. Image quality was adequate. Study completion: There were no complications. Transthoracic echocardiography. M-mode, complete 2D, spectral Doppler, and color Doppler. Birthdate: Patient birthdate: 1926-05-17. Age: Patient is 82 yr old. Sex: Gender:  male. BMI: 30.6 kg/m^2. Blood pressure: 110/50 Patient status: Outpatient. Study date: Study date: 06/07/2016. Study time: 11:39 AM. Location: [facility]  -------------------------------------------------------------------  ------------------------------------------------------------------- Left ventricle: The cavity size was mildly dilated. Wall thickness was normal. Basal to mid inferior and basal to mid inferolateral severe hypokinesis. Systolic function was mildly reduced. The estimated ejection fraction was in the range of 45% to 50%. Doppler parameters are consistent with abnormal left ventricular relaxation (grade 1 diastolic dysfunction).  ------------------------------------------------------------------- Aortic valve: Trileaflet; severely calcified leaflets. Doppler: There was severe stenosis. There was mild to moderate regurgitation. VTI ratio of LVOT to aortic valve: 0.18. Valve area (VTI): 0.75 cm^2. Indexed valve area (VTI): 0.34 cm^2/m^2. Peak velocity ratio of LVOT to aortic valve: 0.17. Valve area (Vmax): 0.7 cm^2. Indexed valve area (Vmax): 0.32 cm^2/m^2. Mean velocity ratio of LVOT to aortic valve: 0.15. Valve area (Vmean): 0.64 cm^2. Indexed valve area (Vmean): 0.29 cm^2/m^2. Mean gradient (S): 41 mm Hg. Peak gradient (S): 62 mm Hg.  ------------------------------------------------------------------- Aorta: Dilated aortic root.  ------------------------------------------------------------------- Mitral valve: Mildly calcified annulus. Doppler: There was no evidence for stenosis. There was mild regurgitation. Peak gradient (D): 2 mm Hg.  ------------------------------------------------------------------- Left atrium: The atrium was moderately dilated.  ------------------------------------------------------------------- Right ventricle: The cavity size was normal. Systolic function was mildly  reduced.  ------------------------------------------------------------------- Pulmonic valve: Structurally normal valve. Cusp separation was normal. Doppler: Transvalvular velocity was within the normal range. There was trivial regurgitation.  ------------------------------------------------------------------- Tricuspid valve: Doppler: There was trivial regurgitation.  ------------------------------------------------------------------- Right atrium: The atrium was mildly dilated.  ------------------------------------------------------------------- Pericardium: There was no pericardial effusion.  ------------------------------------------------------------------- Systemic veins: Inferior vena cava: The vessel was normal in size. The respirophasic diameter changes were in the normal range (>= 50%), consistent with normal central venous pressure.  ------------------------------------------------------------------- Post procedure conclusions Ascending Aorta:  - Dilated aortic root.  ------------------------------------------------------------------- Measurements  Left ventricle Value Reference LV ID, ED, PLAX chordal (H) 59.5 mm 43 - 52 LV ID, ES, PLAX chordal (H) 52.4 mm 23 - 38 LV fx shortening, PLAX chordal (L) 12 % >=29 LV PW thickness, ED 11.7 mm --------- IVS/LV PW ratio, ED 0.97 <=1.3 Stroke volume, 2D 82 ml --------- Stroke volume/bsa, 2D  37 ml/m^2 --------- LV e&', lateral 10 cm/s --------- LV E/e&', lateral 7.25 --------- LV e&', medial 6.27 cm/s --------- LV E/e&', medial 11.56  --------- LV e&', average 8.14 cm/s --------- LV E/e&', average 8.91 ---------  Ventricular septum Value Reference IVS thickness, ED 11.4 mm ---------  LVOT Value Reference LVOT ID, S 23 mm --------- LVOT area 4.15 cm^2 --------- LVOT peak velocity, S 65.8 cm/s --------- LVOT mean velocity, S 44.7 cm/s --------- LVOT VTI, S 19.7 cm ---------  Aortic valve Value Reference Aortic valve peak velocity, S 391 cm/s --------- Aortic valve mean velocity, S 291 cm/s --------- Aortic valve VTI, S 109 cm --------- Aortic mean gradient, S 38 mm Hg --------- Aortic peak gradient, S 61 mm Hg --------- VTI ratio, LVOT/AV 0.18 --------- Aortic valve area, VTI 0.75 cm^2 --------- Aortic valve area/bsa, VTI 0.34 cm^2/m^2 --------- Velocity ratio, peak, LVOT/AV 0.17 --------- Aortic valve area, peak velocity 0.7 cm^2 --------- Aortic valve area/bsa, peak 0.32 cm^2/m^2 --------- velocity Velocity ratio, mean, LVOT/AV 0.15 --------- Aortic valve area, mean velocity 0.64 cm^2 --------- Aortic valve area/bsa, mean 0.29 cm^2/m^2 --------- velocity Aortic regurg pressure half-time 596 ms ---------  Aorta Value Reference Aortic root ID, ED  42 mm --------- Ascending aorta ID, A-P, S 38 mm ---------  Left atrium Value Reference LA ID, A-P, ES 65 mm --------- LA ID/bsa, A-P (H) 2.94 cm/m^2 <=2.2 LA volume, S 108 ml --------- LA volume/bsa, S 48.9 ml/m^2 --------- LA volume, ES, 1-p A4C 86 ml --------- LA volume/bsa, ES, 1-p A4C 38.9 ml/m^2 --------- LA volume, ES, 1-p A2C 123 ml --------- LA volume/bsa, ES, 1-p A2C 55.7 ml/m^2 ---------  Mitral valve Value Reference Mitral E-wave peak velocity 72.5 cm/s --------- Mitral A-wave peak velocity 78.3 cm/s --------- Mitral deceleration time (H) 274 ms 150 - 230 Mitral peak gradient, D 2 mm Hg --------- Mitral E/A ratio, peak 0.9 ---------  Pulmonary arteries Value Reference PA pressure, S, DP 26 mm Hg <=30  Tricuspid valve Value Reference Tricuspid regurg peak velocity 239 cm/s --------- Tricuspid peak RV-RA gradient 23 mm Hg ---------  Right ventricle Value Reference RV s&', lateral, S 9.21 cm/s ---------  Pulmonic valve Value Reference Pulmonic regurg velocity, ED 97.9 cm/s ---------  Legend: (L) and (H) mark values outside specified reference range.  ------------------------------------------------------------------- Prepared and  Electronically Authenticated by  Loralie Champagne, M.D. 2017-08-31T22:26:39  ADDENDUM REPORT: 06/06/2016 14:17  CLINICAL DATA: 82 year old male with severe aortic stenosis.  EXAM: Cardiac TAVR CT  TECHNIQUE: The patient was scanned on a Philips 256 scanner. A 120 kV retrospective scan was triggered in the descending thoracic aorta at 111 HU's. Gantry rotation speed was 270 msecs and collimation was .9 mm. No beta blockade or nitro were given. The 3D data set was reconstructed in 5% intervals of the R-R cycle. Systolic and diastolic phases were analyzed on a dedicated work station using MPR, MIP and VRT modes. The patient received 80 cc of contrast.  FINDINGS: Aortic Valve: Trileaflet, severely thickened and calcified aortic valve with severely restricted leaflet opening. There are mildl calcifications extending into the LVOT predominantly under the left coronary cusp.  Aorta: Aortic root is mildly dilated measuring 66mm in its widest diameter, ascending aortic size is at upper normal limit measuring 40 mm. There are moderate diffuse calcifications and no dissection.  Sinotubular Junction: 35 x 35 mm  Ascending Thoracic Aorta: 40 x 38 mm  Aortic Arch: 32 x 29 mm  Descending Thoracic Aorta: 31 x 28 mm  Sinus of Valsalva is  mildly dilated with measurements:  Non-coronary: 41 mm  Right -coronary: 41 mm  Left -coronary: 44 mm  Coronary Artery Height above Annulus:  Left Main: 12 mm  Right Coronary: 19 mm  Virtual Basal Annulus Measurements:  Maximum / Minimum Diameter: 34 x 25 mm  Perimeter: 111 mm  Area: 658 mm2  Optimum Fluoroscopic Angle for Delivery: RAO 8 CRA 4  IMPRESSION: 1. Trileaflet, severely thickened and calcified aortic valve with severely restricted leaflet opening and mild calcification sin the LVOT. Annular measurements at the higher end of 29 mm Edward-SAPIEN 3 valve but still within acceptable  range.  2. Aortic root is mildly dilated and ascending aorta is at the upper normal limit.  3. Sufficient annulus to coronary distance.  4. Optimum Fluoroscopic Angle for Delivery: RAO 8 CRA 4  Ena Dawley   Electronically Signed By: Ena Dawley On: 06/06/2016 14:17   CLINICAL DATA: 82 year old male with history of severe aortic stenosis. Preprocedural study prior to potential transcatheter aortic valve replacement (TAVR) procedure.  EXAM: CT ANGIOGRAPHY CHEST, ABDOMEN AND PELVIS  TECHNIQUE: Multidetector CT imaging through the chest, abdomen and pelvis was performed using the standard protocol during bolus administration of intravenous contrast. Multiplanar reconstructed images and MIPs were obtained and reviewed to evaluate the vascular anatomy.  CONTRAST: 150 mL of Isovue-300.  COMPARISON: PET-CT 06/17/2006  FINDINGS: CTA CHEST FINDINGS  Cardiovascular: Heart size is enlarged with left ventricular and left atrial dilatation. There is no significant pericardial fluid, thickening or pericardial calcification. There is aortic atherosclerosis, as well as atherosclerosis of the great vessels of the mediastinum and the coronary arteries, including calcified atherosclerotic plaque in the left main, left anterior descending, left circumflex and right coronary arteries. Status post median sternotomy for CABG, including LIMA to the LAD. Extremely severe calcification of the aortic valve.  Mediastinum/Nodes: No pathologically enlarged mediastinal or hilar lymph nodes. Esophagus is unremarkable in appearance. No axillary lymphadenopathy.  Lungs/Pleura: 11 x 9 mm (mean diameter of 10 mm) pulmonary nodule in the medial aspect of the left lower lobe (image 72 of series 407). 6 mm pulmonary nodule in the inferior segment of the lingula (image 70 of series 407). No acute consolidative airspace disease. No  pleural effusions.  Musculoskeletal: Median sternotomy wires. There are no aggressive appearing lytic or blastic lesions noted in the visualized portions of the skeleton. Multiple old healed inferior left-sided rib fractures are incidentally noted.  CTA ABDOMEN AND PELVIS FINDINGS  Hepatobiliary: Small calcified granuloma in segment 4A. No other suspicious hepatic lesions. No intra or extrahepatic biliary ductal dilatation. Gallbladder is normal in appearance.  Pancreas: No pancreatic mass. No pancreatic ductal dilatation. No pancreatic or peripancreatic fluid or inflammatory changes.  Spleen: Unremarkable.  Adrenals/Urinary Tract: Multiple low-attenuation lesions in the kidneys bilaterally, too small to characterize, but statistically likely cysts. 2.1 cm simple cyst in the posterior aspect of the interpolar region of the right kidney. No suspicious renal lesions. No hydroureteronephrosis. Bilateral adrenal glands are normal in appearance. Urinary bladder is heavily trabeculated, with multiple tiny bladder wall diverticulae.  Stomach/Bowel: The appearance of the stomach is normal. No pathologic dilatation of small bowel or colon. Numerous colonic diverticulae are noted, particularly in the descending colon and sigmoid colon, without surrounding inflammatory changes to suggest an acute diverticulitis at this time. The appendix is not confidently identified and may be surgically absent. Regardless, there are no inflammatory changes noted adjacent to the cecum to suggest the presence of an acute appendicitis at this time.  Vascular/Lymphatic:  Aortic atherosclerosis, with vascular findings and measurements pertinent to potential TAVR procedure, as detailed below. In addition, there is short segment fusiform aneurysmal dilatation of the infrarenal abdominal aorta which measures up to 3.3 x 3.0 cm. There is also aneurysmal dilatation of the common iliac arteries  bilaterally measuring up to 2 cm on the right and 2.4 cm on the left. No lymphadenopathy noted in the abdomen or pelvis.  Reproductive: Prostate gland is enlarged measuring 5.3 x 5.4 x 5.5 cm. Seminal vesicles are unremarkable in appearance.  Other: No significant volume of ascites. No pneumoperitoneum.  Musculoskeletal: There are no aggressive appearing lytic or blastic lesions noted in the visualized portions of the skeleton.  VASCULAR MEASUREMENTS PERTINENT TO TAVR:  AORTA:  Minimal Aortic Diameter - 17 x 18 mm  Severity of Aortic Calcification - severe  RIGHT PELVIS:  Right Common Iliac Artery -  Minimal Diameter - 11.5 x 8.0 mm  Tortuosity - mild  Calcification - moderate to severe  Right External Iliac Artery -  Minimal Diameter - 9.5 x 10.0 mm  Tortuosity - moderate to severe  Calcification - minimal  Right Common Femoral Artery -  Minimal Diameter - 10.6 x 5.9 mm  Tortuosity - mild  Calcification - moderate  LEFT PELVIS:  Left Common Iliac Artery -  Minimal Diameter - 9.3 x 9.2 mm  Tortuosity - moderate  Calcification - moderate to severe  Left External Iliac Artery -  Minimal Diameter - 11.1 x 10.1 mm  Tortuosity - moderate to severe  Calcification - minimal  Left Common Femoral Artery -  Minimal Diameter - 11.1 x 6.1 mm  Tortuosity - mild  Calcification - mild to moderate  Review of the MIP images confirms the above findings.  IMPRESSION: 1. Vascular findings and measurements pertinent to potential TAVR procedure, as detailed above. Patient has suitable pelvic arterial access bilaterally. 2. Extremely severe calcifications of the aortic valve, compatible with the reported clinical history of severe aortic stenosis. 3. **An incidental finding of potential clinical significance has been found. Two pulmonary nodules are noted in the left lung, the largest of which has a mean diameter of 1 cm.  Non-contrast chest CT at 3-6 months is recommended. If the nodules are stable at time of repeat CT, then future CT at 18-24 months (from today's scan) is considered optional for low-risk patients, but is recommended for high-risk patients. This recommendation follows the consensus statement: Guidelines for Management of Incidental Pulmonary Nodules Detected on CT Images:From the Fleischner Society 2017; published online before print (10.1148/radiol.IJ:2314499).** 4. **An incidental finding of potential clinical significance has been found. Abdominal aortic aneurysm measuring 3.3 x 3.0 cm. Recommend followup by ultrasound in 3 years. This recommendation follows ACR consensus guidelines: White Paper of the ACR Incidental Findings Committee II on Vascular Findings. J Am Coll Radiol 2013; 10:789-794. ** 5. Colonic diverticulosis without evidence of acute diverticulitis at this time. 6. Additional incidental findings, as above.   Electronically Signed By: Vinnie Langton M.D. On: 06/06/2016 13:23  Impression:  This 82 year old gentleman has stage D severe symptomatic aortic stenosis with progressive symptoms of exertional fatigue and shortness of breath. I have personally reviewed and interpreted his recent echo, cath and CTA studies. His aortic valve leaflets are severely calcified with poor leaflet mobility and the mean gradient has continued to rise to 41 mm Hg. His cath shows stable bypass grafts and no reason for intervention. I think TAVR is the best treatment for him. His operative risk  with open surgical AVR and CABG would be high given his advanced age, prior CABG, and comorbid factors. His cardiac CT shows an annular area of 658 which is at the upper end of the 29 mm Sapien 3 but should be acceptable. His abdominal and pelvic CT show adequate pelvic arterial anatomy for a transfemoral approach.   The patient and his family were counseled at length regarding treatment  alternatives for management of severe symptomatic aortic stenosis. The risks and benefits of surgical intervention has been discussed in detail. Long-term prognosis with medical therapy was discussed. Alternative approaches such as conventional surgical aortic valve replacement, transcatheter aortic valve replacement, and palliative medical therapy were compared and contrasted at length. This discussion was placed in the context of the patient's own specific clinical presentation and past medical history. All of their questions been addressed. The patient is eager to proceed with surgical management as soon as possible.   Following the decision to proceed with transcatheter aortic valve replacement, a discussion was held regarding what types of management strategies would be attempted intraoperatively in the event of life-threatening complications, including whether or not the patient would be considered a candidate for the use of cardiopulmonary bypass and/or conversion to open sternotomy for attempted surgical intervention. The patient is aware of the fact that transient use of cardiopulmonary bypass may be necessary, but the patient specifically states that she would not wish to undergo redo median sternotomy under any circumstances, even if she were to develop potentially lethal complications related to transcatheter valves appointment.   The patient has been advised of a variety of complications that might develop including but not limited to risks of death, stroke, paravalvular leak, aortic dissection or other major vascular complications, aortic annulus rupture, device embolization, cardiac rupture or perforation, mitral regurgitation, acute myocardial infarction, arrhythmia, heart block or bradycardia requiring permanent pacemaker placement, congestive heart failure, respiratory failure, renal failure, pneumonia, infection, other late complications related to structural valve deterioration or  migration, or other complications that might ultimately cause a temporary or permanent loss of functional independence or other long term morbidity. The patient provides full informed consent for the procedure as described and all questions were answered.    Plan:  Transfemoral TAVR on 06/26/2016   Gaye Pollack, MD [facility] [phone]

## 2016-06-25 NOTE — Progress Notes (Signed)
Anesthesia Chart Review: Patient is a 82 year old male scheduled for TAVR, transfemoral approach on 06/26/16 by Dr. Darcey Nora.  History includes former smoker, CAD s/p CABG (LIMA-LAD, SVG-DIAG, SVG-RCA, SVG-OM) '93 with NSTEMI '05 (SVG-DIAG occlusion) and s/p BMS ostium SVG-RCA 10/20/10 and BMS to ostium SVG-OM 04/21/14, chronic combined systolic and diastolic CHF, severe AS, right BBB, HTN, HLD, hypothyroidism, BPH, non-Hodgkin's lymphoma, left radial fracture s/p ORIF 02/24/16 and I&D 03/09/16. He had an incidental findings of a small 3.3 cm AAA and two left pulmonary nodules on 06/06/16 CTA chest/abd/pelvis. 3 year AAA follow-up and 3-6 month pulmonary nodule follow-up recommended.  PCP is Dr. [name]. Primary cardiologist is Dr. Daneen Schick.  Meds include ASA 81mg , Lipitor, Proscar, folic acid, HCTZ, Imdur, levothyroxine, magnesium, Lopressor, Nitro, potassium, Flomax.  07/12/15 EKG: Ectopic atrial rhythm, incomplete right bundle, LVH with repolarization abnormality, septal infarct (age undetermined), inferior infarct (age undetermined). No significant change since last tracing 05/17/16 per Dr. Curt Bears.   06/07/16 Echocardiogram: Study Conclusions - Left ventricle: The cavity size was mildly dilated. Wall   thickness was normal. Basal to mid inferior and basal to mid   inferolateral severe hypokinesis. Systolic function was mildly   reduced. The estimated ejection fraction was in the range of 45%   to 50%. Doppler parameters are consistent with abnormal left   ventricular relaxation (grade 1 diastolic dysfunction). - Aortic valve: Trileaflet; severely calcified leaflets. There was   severe stenosis. There was mild to moderate regurgitation. Mean   gradient (S): 41 mm Hg. Valve area (VTI): 0.75 cm^2. - Aorta: Dilated aortic root. Aortic root dimension: 42 mm (ED). - Mitral valve: Mildly calcified annulus. There was mild   regurgitation. - Left atrium: The atrium was moderately  dilated. - Right ventricle: The cavity size was normal. Systolic function   was mildly reduced. - Right atrium: The atrium was mildly dilated. - Tricuspid valve: Peak RV-RA gradient (S): 23 mm Hg. - Pulmonary arteries: PA peak pressure: 26 mm Hg (S). - Inferior vena cava: The vessel was normal in size. The   respirophasic diameter changes were in the normal range (>= 50%),   consistent with normal central venous pressure.  05/17/16 Cardiac cath:  Ost Cx lesion, 99 %stenosed.  Mid Cx to Dist Cx lesion, 100 %stenosed.  LM lesion, 50 %stenosed.  Ost LAD to Prox LAD lesion, 70 %stenosed.  Prox LAD lesion, 100 %stenosed.  LIMA graft was visualized by angiography and is normal in caliber and anatomically normal.  Mid LAD to Dist LAD lesion, 60 %stenosed.  SVG graft was visualized by angiography.  Prox Graft lesion, 100 %stenosed.  SVG graft was visualized by angiography and is normal in caliber.  The graft exhibits minimal luminal irregularities.  Origin to Prox Graft lesion, 0 %stenosed.  Mid RCA lesion, 100 %stenosed.  RPDA lesion, 95 %stenosed.  Dist RCA lesion, 100 %stenosed.  LV end diastolic pressure is moderately elevated.  There is moderate aortic valve stenosis.  Origin to Prox Graft lesion, 50 %stenosed.  Ost 1st Mrg lesion, 100 %stenosed.  1.  Severe native coronary artery disease, including total occlusions of mid LAD, distal LCx, and mid RCA, as well as 99% ostial LCx stenosis. 2.  Widely patent LIMA to LAD and SVG to OM. 3.  Patent SVG to PDA and PL with stable 50% ISR at ostium of SVG. Stable compared to prior angiogram from 2015. 4.  Chronically occluded SVG to diagonal. 5.  Normal right heart filling pressures. 6.  Mildly to moderately elevated left ventricular filling pressure. 7.  Moderate to severe aortic stenosis. Plan: 1.  Continue medical therapy of coronary artery disease, as overall appearance is similar to prior catheterization in 2015. 2.   Patient continues to decline TAVR; would benefit from referral back to TAVR team if he decides to proceed with valve intervention in the future.  Preoperative cardiac CT, CTA chest/abd/pelvis, CXR, PFTs, and labs noted. Cr 0.81, H/H 13.0/41.3. PLT 136K. A1c 5.5.   If no acute changes then I anticipate that he can proceed as planned.  George Hugh [facility]/Anesthesiology Phone [phone] 06/25/2016 11:00 AM

## 2016-06-26 ENCOUNTER — Encounter (HOSPITAL_COMMUNITY): Payer: Self-pay | Admitting: *Deleted

## 2016-06-26 ENCOUNTER — Inpatient Hospital Stay (HOSPITAL_COMMUNITY): Payer: Medicare Other | Admitting: Vascular Surgery

## 2016-06-26 ENCOUNTER — Inpatient Hospital Stay (HOSPITAL_COMMUNITY): Payer: Medicare Other | Admitting: Anesthesiology

## 2016-06-26 ENCOUNTER — Inpatient Hospital Stay (HOSPITAL_COMMUNITY)
Admission: RE | Admit: 2016-06-26 | Discharge: 2016-06-28 | DRG: 267 | Disposition: A | Discharge status: Home / Self Care | Payer: Medicare Other | Source: Ambulatory Visit | Attending: Cardiovascular Disease | Admitting: Cardiovascular Disease

## 2016-06-26 ENCOUNTER — Inpatient Hospital Stay (HOSPITAL_COMMUNITY): Payer: Medicare Other

## 2016-06-26 ENCOUNTER — Encounter (HOSPITAL_COMMUNITY)
Admission: RE | Disposition: A | Discharge status: Home / Self Care | Payer: Self-pay | Source: Ambulatory Visit | Attending: Cardiovascular Disease

## 2016-06-26 ENCOUNTER — Ambulatory Visit (HOSPITAL_COMMUNITY): Payer: Medicare Other

## 2016-06-26 DIAGNOSIS — Z952 Presence of prosthetic heart valve: Secondary | ICD-10-CM | POA: Diagnosis not present

## 2016-06-26 DIAGNOSIS — I350 Nonrheumatic aortic (valve) stenosis: Secondary | ICD-10-CM

## 2016-06-26 DIAGNOSIS — I714 Abdominal aortic aneurysm, without rupture, unspecified: Secondary | ICD-10-CM | POA: Diagnosis present

## 2016-06-26 DIAGNOSIS — I214 Non-ST elevation (NSTEMI) myocardial infarction: Secondary | ICD-10-CM | POA: Diagnosis not present

## 2016-06-26 DIAGNOSIS — Z7982 Long term (current) use of aspirin: Secondary | ICD-10-CM

## 2016-06-26 DIAGNOSIS — Z954 Presence of other heart-valve replacement: Secondary | ICD-10-CM | POA: Diagnosis not present

## 2016-06-26 DIAGNOSIS — I110 Hypertensive heart disease with heart failure: Secondary | ICD-10-CM | POA: Diagnosis present

## 2016-06-26 DIAGNOSIS — Z953 Presence of xenogenic heart valve: Secondary | ICD-10-CM

## 2016-06-26 DIAGNOSIS — I252 Old myocardial infarction: Secondary | ICD-10-CM | POA: Diagnosis not present

## 2016-06-26 DIAGNOSIS — I5042 Chronic combined systolic (congestive) and diastolic (congestive) heart failure: Secondary | ICD-10-CM | POA: Diagnosis present

## 2016-06-26 DIAGNOSIS — Z006 Encounter for examination for normal comparison and control in clinical research program: Secondary | ICD-10-CM | POA: Diagnosis not present

## 2016-06-26 DIAGNOSIS — I080 Rheumatic disorders of both mitral and aortic valves: Secondary | ICD-10-CM | POA: Diagnosis present

## 2016-06-26 DIAGNOSIS — D696 Thrombocytopenia, unspecified: Secondary | ICD-10-CM | POA: Diagnosis not present

## 2016-06-26 DIAGNOSIS — R079 Chest pain, unspecified: Secondary | ICD-10-CM | POA: Diagnosis not present

## 2016-06-26 DIAGNOSIS — E039 Hypothyroidism, unspecified: Secondary | ICD-10-CM | POA: Diagnosis present

## 2016-06-26 DIAGNOSIS — I25119 Atherosclerotic heart disease of native coronary artery with unspecified angina pectoris: Secondary | ICD-10-CM | POA: Diagnosis present

## 2016-06-26 DIAGNOSIS — I25810 Atherosclerosis of coronary artery bypass graft(s) without angina pectoris: Secondary | ICD-10-CM | POA: Diagnosis present

## 2016-06-26 DIAGNOSIS — I2510 Atherosclerotic heart disease of native coronary artery without angina pectoris: Secondary | ICD-10-CM | POA: Diagnosis not present

## 2016-06-26 DIAGNOSIS — I10 Essential (primary) hypertension: Secondary | ICD-10-CM | POA: Diagnosis present

## 2016-06-26 DIAGNOSIS — I4510 Unspecified right bundle-branch block: Secondary | ICD-10-CM

## 2016-06-26 DIAGNOSIS — I959 Hypotension, unspecified: Secondary | ICD-10-CM

## 2016-06-26 DIAGNOSIS — I5043 Acute on chronic combined systolic (congestive) and diastolic (congestive) heart failure: Secondary | ICD-10-CM | POA: Diagnosis present

## 2016-06-26 DIAGNOSIS — E785 Hyperlipidemia, unspecified: Secondary | ICD-10-CM | POA: Diagnosis present

## 2016-06-26 HISTORY — DX: Presence of prosthetic heart valve: Z95.2

## 2016-06-26 HISTORY — DX: Other nonspecific abnormal finding of lung field: R91.8

## 2016-06-26 HISTORY — PX: TRANSCATHETER AORTIC VALVE REPLACEMENT, TRANSFEMORAL: SHX6400

## 2016-06-26 HISTORY — DX: Abdominal aortic aneurysm, without rupture, unspecified: I71.40

## 2016-06-26 HISTORY — PX: TEE WITHOUT CARDIOVERSION: SHX5443

## 2016-06-26 HISTORY — DX: Abdominal aortic aneurysm, without rupture: I71.4

## 2016-06-26 LAB — POCT I-STAT, CHEM 8
BUN: 14 mg/dL (ref 6–20)
BUN: 15 mg/dL (ref 6–20)
BUN: 14 mg/dL (ref 6–20)
CALCIUM ION: 1.19 mmol/L (ref 1.15–1.40)
CHLORIDE: 101 mmol/L (ref 101–111)
CHLORIDE: 101 mmol/L (ref 101–111)
CREATININE: 0.6 mg/dL — AB (ref 0.61–1.24)
CREATININE: 0.7 mg/dL (ref 0.61–1.24)
CREATININE: 0.7 mg/dL (ref 0.61–1.24)
Calcium, Ion: 1.23 mmol/L (ref 1.15–1.40)
Calcium, Ion: 1.19 mmol/L (ref 1.15–1.40)
Chloride: 102 mmol/L (ref 101–111)
GLUCOSE: 121 mg/dL — AB (ref 65–99)
GLUCOSE: 97 mg/dL (ref 65–99)
Glucose, Bld: 123 mg/dL — ABNORMAL HIGH (ref 65–99)
HCT: 39 % (ref 39.0–52.0)
HCT: 39 % (ref 39.0–52.0)
HEMATOCRIT: 37 % — AB (ref 39.0–52.0)
Hemoglobin: 13.3 g/dL (ref 13.0–17.0)
Hemoglobin: 13.3 g/dL (ref 13.0–17.0)
Hemoglobin: 12.6 g/dL — ABNORMAL LOW (ref 13.0–17.0)
POTASSIUM: 3.8 mmol/L (ref 3.5–5.1)
Potassium: 3.9 mmol/L (ref 3.5–5.1)
Potassium: 3.8 mmol/L (ref 3.5–5.1)
SODIUM: 138 mmol/L (ref 135–145)
Sodium: 139 mmol/L (ref 135–145)
Sodium: 140 mmol/L (ref 135–145)
TCO2: 26 mmol/L (ref 0–100)
TCO2: 28 mmol/L (ref 0–100)
TCO2: 25 mmol/L (ref 0–100)

## 2016-06-26 LAB — POCT I-STAT 3, ART BLOOD GAS (G3+)
ACID-BASE EXCESS: 4 mmol/L — AB (ref 0.0–2.0)
Bicarbonate: 30.1 mmol/L — ABNORMAL HIGH (ref 20.0–28.0)
O2 Saturation: 96 %
PH ART: 7.372 (ref 7.350–7.450)
PO2 ART: 83 mmHg (ref 83.0–108.0)
TCO2: 32 mmol/L (ref 0–100)
pCO2 arterial: 51.5 mmHg — ABNORMAL HIGH (ref 32.0–48.0)

## 2016-06-26 LAB — CBC
HEMATOCRIT: 38.4 % — AB (ref 39.0–52.0)
Hemoglobin: 12.3 g/dL — ABNORMAL LOW (ref 13.0–17.0)
MCH: 26.6 pg (ref 26.0–34.0)
MCHC: 32 g/dL (ref 30.0–36.0)
MCV: 83.1 fL (ref 78.0–100.0)
PLATELETS: 105 10*3/uL — AB (ref 150–400)
RBC: 4.62 MIL/uL (ref 4.22–5.81)
RDW: 16.2 % — AB (ref 11.5–15.5)
WBC: 5.8 10*3/uL (ref 4.0–10.5)

## 2016-06-26 LAB — POCT I-STAT 4, (NA,K, GLUC, HGB,HCT)
GLUCOSE: 97 mg/dL (ref 65–99)
HCT: 38 % — ABNORMAL LOW (ref 39.0–52.0)
Hemoglobin: 12.9 g/dL — ABNORMAL LOW (ref 13.0–17.0)
Potassium: 3.8 mmol/L (ref 3.5–5.1)
Sodium: 142 mmol/L (ref 135–145)

## 2016-06-26 LAB — PROTIME-INR
INR: 1.37
PROTHROMBIN TIME: 17 s — AB (ref 11.4–15.2)

## 2016-06-26 LAB — APTT: aPTT: 40 seconds — ABNORMAL HIGH (ref 24–36)

## 2016-06-26 SURGERY — IMPLANTATION, AORTIC VALVE, TRANSCATHETER, FEMORAL APPROACH
Anesthesia: General | Site: Groin

## 2016-06-26 MED ORDER — ACETAMINOPHEN 500 MG PO TABS
1000.0000 mg | ORAL_TABLET | Freq: Four times a day (QID) | ORAL | Status: DC
  Administered 2016-06-27 (×2): 1000 mg via ORAL
  Filled 2016-06-26 (×3): qty 2

## 2016-06-26 MED ORDER — MAGNESIUM OXIDE 400 (241.3 MG) MG PO TABS
200.0000 mg | ORAL_TABLET | Freq: Every day | ORAL | Status: DC
  Administered 2016-06-27 – 2016-06-28 (×2): 200 mg via ORAL
  Filled 2016-06-26 (×2): qty 0.5
  Filled 2016-06-26: qty 1

## 2016-06-26 MED ORDER — MORPHINE SULFATE (PF) 2 MG/ML IV SOLN: 1.0000 mg | INTRAVENOUS | Status: AC | PRN

## 2016-06-26 MED ORDER — LACTATED RINGERS IV SOLN
INTRAVENOUS | Status: DC
Start: 2016-06-26 — End: 2016-06-26
  Administered 2016-06-26 (×2): via INTRAVENOUS

## 2016-06-26 MED ORDER — HEPARIN SODIUM (PORCINE) 1000 UNIT/ML IJ SOLN
INTRAMUSCULAR | Status: DC | PRN
  Administered 2016-06-26: 18000 [IU] via INTRAVENOUS

## 2016-06-26 MED ORDER — DEXMEDETOMIDINE HCL IN NACL 200 MCG/50ML IV SOLN
0.1000 ug/kg/h | INTRAVENOUS | Status: DC
  Administered 2016-06-26: .3 ug/kg/h via INTRAVENOUS

## 2016-06-26 MED ORDER — CHLORHEXIDINE GLUCONATE 4 % EX LIQD: 30.0000 mL | CUTANEOUS | Status: DC

## 2016-06-26 MED ORDER — FOLIC ACID 1 MG PO TABS
1000.0000 ug | ORAL_TABLET | ORAL | Status: DC
  Administered 2016-06-27: 1 mg via ORAL
  Filled 2016-06-26 (×2): qty 1

## 2016-06-26 MED ORDER — DEXTROSE 5 % IV SOLN
0.0000 ug/min | INTRAVENOUS | Status: DC
  Filled 2016-06-26: qty 2

## 2016-06-26 MED ORDER — ACETAMINOPHEN 650 MG RE SUPP: 650.0000 mg | Freq: Once | RECTAL | Status: DC

## 2016-06-26 MED ORDER — MIDAZOLAM HCL 2 MG/2ML IJ SOLN: 2.0000 mg | INTRAMUSCULAR | Status: DC | PRN

## 2016-06-26 MED ORDER — CHLORHEXIDINE GLUCONATE 4 % EX LIQD: 60.0000 mL | Freq: Once | CUTANEOUS | Status: DC

## 2016-06-26 MED ORDER — B COMPLEX-C PO TABS
1.0000 | ORAL_TABLET | ORAL | Status: DC
  Administered 2016-06-27: 1 via ORAL
  Filled 2016-06-26: qty 1

## 2016-06-26 MED ORDER — MAGNESIUM SULFATE 4 GM/100ML IV SOLN
4.0000 g | Freq: Once | INTRAVENOUS | Status: AC
  Administered 2016-06-26: 4 g via INTRAVENOUS
  Filled 2016-06-26: qty 100

## 2016-06-26 MED ORDER — HYDROCHLOROTHIAZIDE 12.5 MG PO CAPS
12.5000 mg | ORAL_CAPSULE | Freq: Every day | ORAL | Status: DC
  Administered 2016-06-27 – 2016-06-28 (×2): 12.5 mg via ORAL
  Filled 2016-06-26 (×2): qty 1

## 2016-06-26 MED ORDER — DEXTROSE 5 % IV SOLN
INTRAVENOUS | Status: DC | PRN
  Administered 2016-06-26: 4 ug/min via INTRAVENOUS

## 2016-06-26 MED ORDER — ORAL CARE MOUTH RINSE
15.0000 mL | Freq: Two times a day (BID) | OROMUCOSAL | Status: DC
  Administered 2016-06-27 – 2016-06-28 (×2): 15 mL via OROMUCOSAL

## 2016-06-26 MED ORDER — SODIUM CHLORIDE 0.9 % IV SOLN
INTRAVENOUS | Status: DC | PRN
  Administered 2016-06-26: 1500 mL

## 2016-06-26 MED ORDER — ONDANSETRON HCL 4 MG/2ML IJ SOLN
INTRAMUSCULAR | Status: DC | PRN
  Administered 2016-06-26: 4 mg via INTRAVENOUS

## 2016-06-26 MED ORDER — ALBUMIN HUMAN 5 % IV SOLN: 250.0000 mL | INTRAVENOUS | Status: DC | PRN

## 2016-06-26 MED ORDER — FAMOTIDINE IN NACL 20-0.9 MG/50ML-% IV SOLN
20.0000 mg | Freq: Two times a day (BID) | INTRAVENOUS | Status: AC
  Administered 2016-06-26 (×2): 20 mg via INTRAVENOUS
  Filled 2016-06-26 (×2): qty 50

## 2016-06-26 MED ORDER — SODIUM CHLORIDE 0.9 % IV SOLN
INTRAVENOUS | Status: AC
  Administered 2016-06-26: 15:00:00 via INTRAVENOUS

## 2016-06-26 MED ORDER — NITROGLYCERIN IN D5W 200-5 MCG/ML-% IV SOLN: 0.0000 ug/min | INTRAVENOUS | Status: DC

## 2016-06-26 MED ORDER — LEVOTHYROXINE SODIUM 25 MCG PO TABS
125.0000 ug | ORAL_TABLET | Freq: Every day | ORAL | Status: DC
  Administered 2016-06-27 – 2016-06-28 (×2): 125 ug via ORAL
  Filled 2016-06-26 (×2): qty 1

## 2016-06-26 MED ORDER — ROCURONIUM BROMIDE 100 MG/10ML IV SOLN
INTRAVENOUS | Status: DC | PRN
  Administered 2016-06-26: 50 mg via INTRAVENOUS
  Administered 2016-06-26: 10 mg via INTRAVENOUS

## 2016-06-26 MED ORDER — OXYCODONE HCL 5 MG PO TABS: 5.0000 mg | ORAL_TABLET | ORAL | Status: DC | PRN

## 2016-06-26 MED ORDER — ASPIRIN EC 81 MG PO TBEC
81.0000 mg | DELAYED_RELEASE_TABLET | Freq: Every day | ORAL | Status: DC
  Administered 2016-06-26 – 2016-06-28 (×3): 81 mg via ORAL
  Filled 2016-06-26 (×3): qty 1

## 2016-06-26 MED ORDER — CHLORHEXIDINE GLUCONATE 0.12 % MT SOLN
15.0000 mL | OROMUCOSAL | Status: AC
  Administered 2016-06-26: 15 mL via OROMUCOSAL

## 2016-06-26 MED ORDER — LACTATED RINGERS IV SOLN
INTRAVENOUS | Status: DC | PRN
  Administered 2016-06-26: 12:00:00 via INTRAVENOUS

## 2016-06-26 MED ORDER — LACTATED RINGERS IV SOLN: 500.0000 mL | Freq: Once | INTRAVENOUS | Status: DC | PRN

## 2016-06-26 MED ORDER — SUGAMMADEX SODIUM 200 MG/2ML IV SOLN
INTRAVENOUS | Status: DC | PRN
  Administered 2016-06-26: 200 mg via INTRAVENOUS

## 2016-06-26 MED ORDER — VITAMIN C 500 MG PO TABS
1000.0000 mg | ORAL_TABLET | ORAL | Status: DC
  Administered 2016-06-27: 1000 mg via ORAL
  Filled 2016-06-26 (×2): qty 2

## 2016-06-26 MED ORDER — DEXTROSE 5 % IV SOLN
1.5000 g | Freq: Two times a day (BID) | INTRAVENOUS | Status: AC
  Administered 2016-06-26 – 2016-06-28 (×4): 1.5 g via INTRAVENOUS
  Filled 2016-06-26 (×5): qty 1.5

## 2016-06-26 MED ORDER — NITROGLYCERIN 0.4 MG SL SUBL: 0.4000 mg | SUBLINGUAL_TABLET | SUBLINGUAL | Status: DC | PRN

## 2016-06-26 MED ORDER — ATORVASTATIN CALCIUM 40 MG PO TABS
40.0000 mg | ORAL_TABLET | Freq: Every day | ORAL | Status: DC
  Administered 2016-06-26 – 2016-06-27 (×2): 40 mg via ORAL
  Filled 2016-06-26 (×2): qty 1

## 2016-06-26 MED ORDER — FENTANYL CITRATE (PF) 100 MCG/2ML IJ SOLN
50.0000 ug | Freq: Once | INTRAMUSCULAR | Status: AC
  Administered 2016-06-26: 50 ug via INTRAVENOUS
  Filled 2016-06-26: qty 1

## 2016-06-26 MED ORDER — MORPHINE SULFATE (PF) 2 MG/ML IV SOLN: 2.0000 mg | INTRAVENOUS | Status: DC | PRN

## 2016-06-26 MED ORDER — CLOPIDOGREL BISULFATE 75 MG PO TABS
75.0000 mg | ORAL_TABLET | Freq: Every day | ORAL | Status: DC
Start: 2016-06-27 — End: 2016-06-28
  Administered 2016-06-27 – 2016-06-28 (×2): 75 mg via ORAL
  Filled 2016-06-26 (×2): qty 1

## 2016-06-26 MED ORDER — PROPOFOL 10 MG/ML IV BOLUS
INTRAVENOUS | Status: DC | PRN
  Administered 2016-06-26: 70 mg via INTRAVENOUS

## 2016-06-26 MED ORDER — TAMSULOSIN HCL 0.4 MG PO CAPS
0.4000 mg | ORAL_CAPSULE | Freq: Two times a day (BID) | ORAL | Status: DC
  Administered 2016-06-26 – 2016-06-28 (×4): 0.4 mg via ORAL
  Filled 2016-06-26 (×4): qty 1

## 2016-06-26 MED ORDER — 0.9 % SODIUM CHLORIDE (POUR BTL) OPTIME
TOPICAL | Status: DC | PRN
  Administered 2016-06-26: 4000 mL

## 2016-06-26 MED ORDER — IODIXANOL 320 MG/ML IV SOLN
INTRAVENOUS | Status: DC | PRN
  Administered 2016-06-26: 75.9 mL via INTRA_ARTERIAL

## 2016-06-26 MED ORDER — ONDANSETRON HCL 4 MG/2ML IJ SOLN: 4.0000 mg | Freq: Four times a day (QID) | INTRAMUSCULAR | Status: DC | PRN

## 2016-06-26 MED ORDER — LIDOCAINE HCL (CARDIAC) 20 MG/ML IV SOLN
INTRAVENOUS | Status: DC | PRN
  Administered 2016-06-26: 50 mg via INTRAVENOUS

## 2016-06-26 MED ORDER — MIDAZOLAM HCL 2 MG/2ML IJ SOLN
INTRAMUSCULAR | Status: AC
  Administered 2016-06-26: 1 mg via INTRAVENOUS
  Filled 2016-06-26: qty 2

## 2016-06-26 MED ORDER — FINASTERIDE 5 MG PO TABS
5.0000 mg | ORAL_TABLET | Freq: Every day | ORAL | Status: DC
  Administered 2016-06-26 – 2016-06-28 (×3): 5 mg via ORAL
  Filled 2016-06-26 (×3): qty 1

## 2016-06-26 MED ORDER — ACETAMINOPHEN 160 MG/5ML PO SOLN: 1000.0000 mg | Freq: Four times a day (QID) | ORAL | Status: DC

## 2016-06-26 MED ORDER — VANCOMYCIN HCL IN DEXTROSE 1-5 GM/200ML-% IV SOLN
1000.0000 mg | Freq: Once | INTRAVENOUS | Status: AC
  Administered 2016-06-26: 1000 mg via INTRAVENOUS
  Filled 2016-06-26: qty 200

## 2016-06-26 MED ORDER — PROTAMINE SULFATE 10 MG/ML IV SOLN
INTRAVENOUS | Status: DC | PRN
  Administered 2016-06-26: 110 mg via INTRAVENOUS

## 2016-06-26 MED ORDER — ACETAMINOPHEN 160 MG/5ML PO SOLN: 650.0000 mg | Freq: Once | ORAL | Status: DC

## 2016-06-26 MED ORDER — FENTANYL CITRATE (PF) 100 MCG/2ML IJ SOLN
INTRAMUSCULAR | Status: AC
  Filled 2016-06-26: qty 2

## 2016-06-26 MED ORDER — REMIFENTANIL HCL 1 MG IV SOLR
INTRAVENOUS | Status: DC | PRN
  Administered 2016-06-26: .5 ug/kg/min via INTRAVENOUS

## 2016-06-26 MED ORDER — POTASSIUM CHLORIDE 10 MEQ/50ML IV SOLN
10.0000 meq | INTRAVENOUS | Status: AC
  Administered 2016-06-26 (×3): 10 meq via INTRAVENOUS
  Filled 2016-06-26 (×3): qty 50

## 2016-06-26 MED ORDER — TRAMADOL HCL 50 MG PO TABS: 50.0000 mg | ORAL_TABLET | ORAL | Status: DC | PRN

## 2016-06-26 MED ORDER — PANTOPRAZOLE SODIUM 40 MG PO TBEC
40.0000 mg | DELAYED_RELEASE_TABLET | Freq: Every day | ORAL | Status: DC
  Administered 2016-06-28: 40 mg via ORAL
  Filled 2016-06-26: qty 1

## 2016-06-26 MED FILL — Magnesium Sulfate Inj 50%: INTRAMUSCULAR | Qty: 10 | Status: AC

## 2016-06-26 MED FILL — Potassium Chloride Inj 2 mEq/ML: INTRAVENOUS | Qty: 40 | Status: AC

## 2016-06-26 MED FILL — Heparin Sodium (Porcine) Inj 1000 Unit/ML: INTRAMUSCULAR | Qty: 30 | Status: AC

## 2016-06-26 SURGICAL SUPPLY — 94 items
ADAPTER UNIV SWAN GANZ BIP (ADAPTER) ×2 IMPLANT
ADAPTER UNV SWAN GANZ BIP (ADAPTER) ×1
BAG BANDED W/RUBBER/TAPE 36X54 (MISCELLANEOUS) ×3 IMPLANT
BAG DECANTER FOR FLEXI CONT (MISCELLANEOUS) IMPLANT
BAG SNAP BAND KOVER 36X36 (MISCELLANEOUS) ×6 IMPLANT
BLADE OSCILLATING /SAGITTAL (BLADE) IMPLANT
BLADE STERNUM SYSTEM 6 (BLADE) ×3 IMPLANT
BLADE SURG ROTATE 9660 (MISCELLANEOUS) ×3 IMPLANT
CABLE PACING FASLOC BIEGE (MISCELLANEOUS) ×3 IMPLANT
CABLE PACING FASLOC BLUE (MISCELLANEOUS) IMPLANT
CANNULA FEM VENOUS REMOTE 22FR (CANNULA) IMPLANT
CANNULA OPTISITE PERFUSION 16F (CANNULA) IMPLANT
CANNULA OPTISITE PERFUSION 18F (CANNULA) IMPLANT
CATH DIAG EXPO 6F VENT PIG 145 (CATHETERS) ×6 IMPLANT
CATH EXPO 5FR AL1 (CATHETERS) ×3 IMPLANT
CATH S G BIP PACING (SET/KITS/TRAYS/PACK) ×6 IMPLANT
CLIP TI MEDIUM 24 (CLIP) IMPLANT
CLIP TI WIDE RED SMALL 24 (CLIP) IMPLANT
CONT SPEC 4OZ CLIKSEAL STRL BL (MISCELLANEOUS) ×18 IMPLANT
COVER BACK TABLE 24X17X13 BIG (DRAPES) ×3 IMPLANT
COVER DOME SNAP 22 D (MISCELLANEOUS) ×3 IMPLANT
COVER MAYO STAND STRL (DRAPES) ×3 IMPLANT
COVER TABLE BACK 60X90 (DRAPES) IMPLANT
CRADLE DONUT ADULT HEAD (MISCELLANEOUS) ×3 IMPLANT
DERMABOND ADVANCED (GAUZE/BANDAGES/DRESSINGS) ×1
DERMABOND ADVANCED .7 DNX12 (GAUZE/BANDAGES/DRESSINGS) ×2 IMPLANT
DEVICE CLOSURE PERCLS PRGLD 6F (VASCULAR PRODUCTS) IMPLANT
DRAPE INCISE IOBAN 66X45 STRL (DRAPES) IMPLANT
DRAPE SLUSH MACHINE 52X66 (DRAPES) ×3 IMPLANT
DRAPE TABLE COVER HEAVY DUTY (DRAPES) IMPLANT
DRSG TEGADERM 4X4.75 (GAUZE/BANDAGES/DRESSINGS) ×6 IMPLANT
ELECT REM PT RETURN 9FT ADLT (ELECTROSURGICAL) ×6
ELECTRODE REM PT RTRN 9FT ADLT (ELECTROSURGICAL) ×4 IMPLANT
FELT TEFLON 6X6 (MISCELLANEOUS) IMPLANT
FEMORAL VENOUS CANN RAP (CANNULA) IMPLANT
GAUZE SPONGE 4X4 12PLY STRL (GAUZE/BANDAGES/DRESSINGS) ×3 IMPLANT
GLOVE BIO SURGEON STRL SZ8 (GLOVE) ×6 IMPLANT
GLOVE EUDERMIC 7 POWDERFREE (GLOVE) ×6 IMPLANT
GLOVE ORTHO TXT STRL SZ7.5 (GLOVE) IMPLANT
GOWN STRL REUS W/ TWL LRG LVL3 (GOWN DISPOSABLE) ×6 IMPLANT
GOWN STRL REUS W/ TWL XL LVL3 (GOWN DISPOSABLE) ×12 IMPLANT
GOWN STRL REUS W/TWL LRG LVL3 (GOWN DISPOSABLE) ×3
GOWN STRL REUS W/TWL XL LVL3 (GOWN DISPOSABLE) ×6
GUIDEWIRE SAF TJ AMPL .035X180 (WIRE) ×3 IMPLANT
GUIDEWIRE SAFE TJ AMPLATZ EXST (WIRE) ×3 IMPLANT
GUIDEWIRE STRAIGHT .035 260CM (WIRE) ×3 IMPLANT
INSERT FOGARTY 61MM (MISCELLANEOUS) IMPLANT
INSERT FOGARTY SM (MISCELLANEOUS) IMPLANT
INSERT FOGARTY XLG (MISCELLANEOUS) IMPLANT
KIT BASIN OR (CUSTOM PROCEDURE TRAY) ×3 IMPLANT
KIT DILATOR VASC 18G NDL (KITS) IMPLANT
KIT HEART LEFT (KITS) ×3 IMPLANT
KIT ROOM TURNOVER OR (KITS) ×3 IMPLANT
KIT SUCTION CATH 14FR (SUCTIONS) IMPLANT
NEEDLE PERC 18GX7CM (NEEDLE) ×3 IMPLANT
NS IRRIG 1000ML POUR BTL (IV SOLUTION) ×15 IMPLANT
PACK AORTA (CUSTOM PROCEDURE TRAY) ×3 IMPLANT
PAD ARMBOARD 7.5X6 YLW CONV (MISCELLANEOUS) ×6 IMPLANT
PAD ELECT DEFIB RADIOL ZOLL (MISCELLANEOUS) ×3 IMPLANT
PERCLOSE PROGLIDE 6F (VASCULAR PRODUCTS)
SET MICROPUNCTURE 5F STIFF (MISCELLANEOUS) ×3 IMPLANT
SHEATH AVANTI 11CM 8FR (MISCELLANEOUS) ×3 IMPLANT
SHEATH PINNACLE 6F 10CM (SHEATH) ×6 IMPLANT
SLEEVE REPOSITIONING LENGTH 30 (MISCELLANEOUS) ×3 IMPLANT
SPONGE GAUZE 4X4 12PLY STER LF (GAUZE/BANDAGES/DRESSINGS) ×6 IMPLANT
SPONGE LAP 4X18 X RAY DECT (DISPOSABLE) IMPLANT
STOPCOCK MORSE 400PSI 3WAY (MISCELLANEOUS) ×18 IMPLANT
SUT ETHIBOND X763 2 0 SH 1 (SUTURE) IMPLANT
SUT GORETEX CV 4 TH 22 36 (SUTURE) IMPLANT
SUT GORETEX CV4 TH-18 (SUTURE) IMPLANT
SUT GORETEX TH-18 36 INCH (SUTURE) IMPLANT
SUT PROLENE 3 0 SH1 36 (SUTURE) IMPLANT
SUT PROLENE 4 0 RB 1 (SUTURE)
SUT PROLENE 4-0 RB1 .5 CRCL 36 (SUTURE) IMPLANT
SUT PROLENE 5 0 C 1 36 (SUTURE) IMPLANT
SUT PROLENE 6 0 C 1 30 (SUTURE) IMPLANT
SUT SILK  1 MH (SUTURE) ×1
SUT SILK 1 MH (SUTURE) ×2 IMPLANT
SUT SILK 2 0 SH CR/8 (SUTURE) IMPLANT
SUT VIC AB 2-0 CT1 27 (SUTURE)
SUT VIC AB 2-0 CT1 TAPERPNT 27 (SUTURE) IMPLANT
SUT VIC AB 2-0 CTX 36 (SUTURE) IMPLANT
SUT VIC AB 3-0 SH 8-18 (SUTURE) IMPLANT
SYR 30ML LL (SYRINGE) ×6 IMPLANT
SYR 50ML LL SCALE MARK (SYRINGE) ×3 IMPLANT
SYRINGE 10CC LL (SYRINGE) ×9 IMPLANT
TAPE CLOTH SURG 4X10 WHT LF (GAUZE/BANDAGES/DRESSINGS) ×3 IMPLANT
TOWEL OR 17X26 10 PK STRL BLUE (TOWEL DISPOSABLE) ×6 IMPLANT
TRANSDUCER W/STOPCOCK (MISCELLANEOUS) ×6 IMPLANT
TRAY FOLEY IC TEMP SENS 16FR (CATHETERS) ×3 IMPLANT
TUBING HIGH PRESSURE 120CM (CONNECTOR) ×3 IMPLANT
VALVE HEART TRANSCATH SZ3 29MM (Prosthesis & Implant Heart) ×3 IMPLANT
WIRE AMPLATZ SS-J .035X180CM (WIRE) ×3 IMPLANT
WIRE BENTSON .035X145CM (WIRE) ×3 IMPLANT

## 2016-06-26 NOTE — Anesthesia Procedure Notes (Signed)
Procedure Name: Intubation Date/Time: 06/26/2016 12:26 PM Performed by: Rebekah Chesterfield L Pre-anesthesia Checklist: Patient identified, Emergency Drugs available, Suction available and Patient being monitored Patient Re-evaluated:Patient Re-evaluated prior to inductionOxygen Delivery Method: Circle System Utilized Preoxygenation: Pre-oxygenation with 100% oxygen Intubation Type: IV induction Ventilation: Mask ventilation without difficulty Laryngoscope Size: Mac and 4 Grade View: Grade I Tube type: Oral Tube size: 8.0 mm Number of attempts: 1 Airway Equipment and Method: Stylet Placement Confirmation: ETT inserted through vocal cords under direct vision,  positive ETCO2 and breath sounds checked- equal and bilateral Secured at: 22 cm Tube secured with: Tape Dental Injury: Teeth and Oropharynx as per pre-operative assessment

## 2016-06-26 NOTE — Transfer of Care (Signed)
Immediate Anesthesia Transfer of Care Note  Patient: Ahan Beene Kovar  Procedure(s) Performed: Procedure(s): TRANSCATHETER AORTIC VALVE REPLACEMENT, TRANSFEMORAL (Bilateral) TRANSESOPHAGEAL ECHOCARDIOGRAM (TEE) (N/A)  Patient Location: SICU  Anesthesia Type:General  Level of Consciousness: awake, alert , oriented and patient cooperative  Airway & Oxygen Therapy: Patient Spontanous Breathing and Patient connected to nasal cannula oxygen  Post-op Assessment: Report given to RN, Post -op Vital signs reviewed and stable and Patient moving all extremities  Post vital signs: Reviewed and stable  Last Vitals:  Vitals:   06/26/16 1411 06/26/16 1416  BP:    Pulse: 67 (!) 53  Resp:    Temp:      Last Pain:  Vitals:   06/26/16 0807  TempSrc: Oral         Complications: No apparent anesthesia complications

## 2016-06-26 NOTE — Anesthesia Preprocedure Evaluation (Addendum)
Anesthesia Evaluation  Patient identified by MRN, date of birth, ID band Patient awake    Reviewed: Allergy & Precautions, NPO status , Patient's Chart, lab work & pertinent test results  Airway Mallampati: II  TM Distance: >3 FB Neck ROM: Full    Dental  (+) Edentulous Upper   Pulmonary former smoker,    breath sounds clear to auscultation       Cardiovascular hypertension,  Rhythm:Regular Rate:Normal     Neuro/Psych    GI/Hepatic   Endo/Other    Renal/GU      Musculoskeletal   Abdominal   Peds  Hematology   Anesthesia Other Findings   Reproductive/Obstetrics                            Anesthesia Physical Anesthesia Plan  ASA: III  Anesthesia Plan: General   Post-op Pain Management:    Induction: Intravenous  Airway Management Planned: Oral ETT  Additional Equipment: Arterial line and CVP  Intra-op Plan:   Post-operative Plan: Extubation in OR  Informed Consent: I have reviewed the patients History and Physical, chart, labs and discussed the procedure including the risks, benefits and alternatives for the proposed anesthesia with the patient or authorized representative who has indicated his/her understanding and acceptance.     Plan Discussed with: CRNA and Anesthesiologist  Anesthesia Plan Comments:         Anesthesia Quick Evaluation

## 2016-06-26 NOTE — CV Procedure (Signed)
[facility]  TAVR OPERATIVE NOTE   Date of Procedure:  06/26/2016  Preoperative Diagnosis: Severe Aortic Stenosis   Postoperative Diagnosis: Same   Procedure:    Transcatheter Aortic Valve Replacement - Transfemoral Approach  Edwards Sapien 3 THV (size 29 mm, model # [id number], serial [id number])   Co-Surgeons: Gaye Pollack, MD and Lauree Chandler, MD   Anesthesiologist:  Dr. Linna Caprice  Echocardiographer:  Dr. Meda Coffee  Pre-operative Echo Findings:  Severe aortic stenosis  Mild left ventricular systolic dysfunction  Post-operative Echo Findings:  Trivial paravalvular leak  Mild left ventricular systolic dysfunction    BRIEF CLINICAL NOTE AND INDICATIONS FOR SURGERY  82 yo male with history of CAD s/p CABG, HTN, Hyperlipidemia, hypothyroidism, large cell non-Hodgkins lymphoma and severe aortic valve stenosis here today for TAVR. He underwent 5V CABG in 1993. He had a NSTEMI in 2005 and was found to have occlusion of the vein graft to the Diagonal. He has since had a bare metal stent placed in the vein graft to the PDA in January 2012 and bare metal stent placement in SVG to OM in 2015. I met him for TAVR evaluation June 2015 and we planned a cardiac cath to define his CAD. This cath on 04/05/14 showed severe disease in the body of the SVG to OM and moderate restenosis in the stent in the body of SVG to PDA. A bare metal stent was placed in the body of the SVG to OM. TAVR was delayed given his improvement in symptoms with PCI of the vein graft. Echo in September 2016 showed moderately severe AS with a peak velocity of 3.92 m/s, a mean aortic valve gradient of 33 mm Hg, and an AVA of 1.03 cm2. There was mild AI and mild MR with an LVEF of 45-50%. He has been followed in our office by Dr. Pernell Dupre. Recently with c/o exertional chest pain and dyspnea. Cardiac cath 05/17/16 per Dr. Tamala Julian with overall stable CAD and grafts. Referred back to valve clinic to discuss TAVR  in August 2017. Given his worsened chest pain and dyspnea, TAVR has been planned. Cardiac CT and CT chest/abd/pelvis performed in planning for TAVR.   During the course of the patient's preoperative work up they have been evaluated comprehensively by a multidisciplinary team of specialists coordinated through the [facility] in the [facility].  They have been demonstrated to suffer from symptomatic severe aortic stenosis as noted above. The patient has been counseled extensively as to the relative risks and benefits of all options for the treatment of severe aortic stenosis including long term medical therapy, conventional surgery for aortic valve replacement, and transcatheter aortic valve replacement.  The patient has been independently evaluated by two cardiac surgeons including Dr Roxy Manns and Dr. Ralph Dowdy, and they are felt to be at high risk for conventional surgical aortic valve replacement. Both surgeons indicated the patient would be a poor candidate for conventional surgery because of comorbidities including advanced age, prior open heart surgery. Based upon review of all of the patient's preoperative diagnostic tests they are felt to be candidate for transcatheter aortic valve replacement using the transfemoral approach as an alternative to high risk conventional surgery.    Following the decision to proceed with transcatheter aortic valve replacement, a discussion has been held regarding what types of management strategies would be attempted intraoperatively in the event of life-threatening complications, including whether or not the patient would be considered a candidate for the  use of cardiopulmonary bypass and/or conversion to open sternotomy for attempted surgical intervention.  The patient has been advised of a variety of complications that might develop peculiar to this approach including but not limited to risks of death, stroke, paravalvular  leak, aortic dissection or other major vascular complications, aortic annulus rupture, device embolization, cardiac rupture or perforation, acute myocardial infarction, arrhythmia, heart block or bradycardia requiring permanent pacemaker placement, congestive heart failure, respiratory failure, renal failure, pneumonia, infection, other late complications related to structural valve deterioration or migration, or other complications that might ultimately cause a temporary or permanent loss of functional independence or other long term morbidity.  The patient provides full informed consent for the procedure as described and all questions were answered preoperatively.    DETAILS OF THE OPERATIVE PROCEDURE  PREPARATION:   The patient is brought to the operating room on the above mentioned date and central monitoring was established by the anesthesia team including placement of Swan-Ganz catheter and radial arterial line. The patient is placed in the supine position on the operating table.  Intravenous antibiotics are administered. General endotracheal anesthesia is induced uneventfully. A Foley catheter is placed.  Baseline transesophageal echocardiogram was performed. The patient's chest, abdomen, both groins, and both lower extremities are prepared and draped in a sterile manner. A time out procedure is performed.   PERIPHERAL ACCESS:   Using the modified Seldinger technique, femoral arterial and venous access were obtained with placement of 6 Fr sheaths on the left side.  A pigtail diagnostic catheter was passed through the femoral arterial sheath under fluoroscopic guidance into the aortic root.  A temporary transvenous pacemaker catheter was passed through the femoral venous sheath under fluoroscopic guidance into the right ventricle.  The pacemaker was tested to ensure stable lead placement and pacemaker capture. Aortic root angiography was performed in order to determine the optimal angiographic  angle for valve deployment.  TRANSFEMORAL ACCESS:  Arterial access obtained in the right femoral artery with a micro-puncture needle and catheter. Placement confirmed in an appropriate location in the right femoral artery. Pre-closure with double ProGlide devices. The patient was heparinized systemically and ACT verified > 250 seconds.    A 16 Fr transfemoral E-sheath was introduced into the right femoral artery after progressively dilating over an Amplatz superstiff wire. An AL-1 catheter was used to direct a straight-tip exchange length wire across the native aortic valve into the left ventricle. This was exchanged out for a pigtail catheter and position was confirmed in the LV apex. Simultaneous LV and Ao pressures were recorded.  The pigtail catheter was then exchanged for an Amplatz Extra-stiff wire in the LV apex. No BAV performed.    TRANSCATHETER HEART VALVE DEPLOYMENT:  An Edwards Sapien 3 THV (size 29 mm) was prepared and crimped per manufacturer's guidelines, and the proper orientation of the valve is confirmed on the [employer] delivery system. The valve was advanced through the introducer sheath using normal technique until in an appropriate position in the abdominal aorta beyond the sheath tip. The balloon was then retracted and using the fine-tuning wheel was centered on the valve. The valve was then advanced across the aortic arch using appropriate flexion of the catheter. The valve was carefully positioned across the aortic valve annulus. The Commander catheter was retracted using normal technique. Once final position of the valve has been confirmed by angiographic assessment, the valve is deployed while temporarily holding ventilation and during rapid ventricular pacing to maintain systolic blood pressure < 50 mmHg  and pulse pressure < 10 mmHg. The balloon inflation is held for >3 seconds after reaching full deployment volume. Once the balloon has fully deflated the balloon is  retracted into the ascending aorta and valve function is assessed using TEE. There is felt to be trivial paravalvular leak and no central aortic insufficiency.  The patient's hemodynamic recovery following valve deployment is good.  The deployment balloon and guidewire are both removed. Echo demostrated acceptable post-procedural gradients, stable mitral valve function, and trivial AI.    PROCEDURE COMPLETION:  The sheath was removed from the right femoral artery and the double Pro-Glide sutures were completed. Protamine was administered once femoral arterial repair was complete. The temporary pacemaker was left in place given new RBBB and bradycardia post valve deployment. The pigtail catheter and right femoral artery sheath was removed with manual pressure used for hemostasis.   The patient tolerated the procedure well and is transported to the surgical intensive care in stable condition. There were no immediate intraoperative complications. All sponge instrument and needle counts are verified correct at completion of the operation.   No blood products were administered during the operation.  The patient received a total of 75.9 mL of intravenous contrast during the procedure.  Lauree Chandler MD 06/26/2016 12:35 PM

## 2016-06-26 NOTE — Progress Notes (Signed)
  Echocardiogram Echocardiogram Transesophageal has been performed.  Tim Campbell M 06/26/2016, 2:07 PM

## 2016-06-26 NOTE — Progress Notes (Signed)
Patient ID: FISHER MEHR, male   DOB: 02-Nov-1925, 82 y.o.   MRN: [id number]  SICU Evening Rounds:   Hemodynamically stable  Rhythm is sinus 60 with no BBB. Temp pacer in place Groin sites look good.  Urine output good    CBC    Component Value Date/Time   WBC 5.8 06/26/2016 1510   RBC 4.62 06/26/2016 1510   HGB 12.9 (L) 06/26/2016 1512   HGB 13.9 07/01/2006 0808   HCT 38.0 (L) 06/26/2016 1512   HCT 41.4 07/01/2006 0808   PLT 105 (L) 06/26/2016 1510   PLT 185 07/01/2006 0808   MCV 83.1 06/26/2016 1510   MCV 86.2 07/01/2006 0808   MCH 26.6 06/26/2016 1510   MCHC 32.0 06/26/2016 1510   RDW 16.2 (H) 06/26/2016 1510   RDW 15.2 (H) 07/01/2006 0808   LYMPHSABS 2,079 05/15/2016 1638   LYMPHSABS 1.7 07/01/2006 0808   MONOABS 567 05/15/2016 1638   MONOABS 0.5 07/01/2006 0808   EOSABS 126 05/15/2016 1638   EOSABS 0.2 07/01/2006 0808   BASOSABS 63 05/15/2016 1638   BASOSABS 0.0 07/01/2006 0808     BMET    Component Value Date/Time   NA 142 06/26/2016 1512   K 3.8 06/26/2016 1512   CL 102 06/26/2016 1402   CO2 26 06/22/2016 1018   GLUCOSE 97 06/26/2016 1512   BUN 14 06/26/2016 1402   CREATININE 0.70 06/26/2016 1402   CREATININE 1.06 05/15/2016 1638   CALCIUM 9.0 06/22/2016 1018   GFRNONAA >60 06/22/2016 1018   GFRAA >60 06/22/2016 1018     A/P:  Stable postop course. Continue current plans

## 2016-06-26 NOTE — Anesthesia Postprocedure Evaluation (Signed)
Anesthesia Post Note  Patient: Tim Campbell  Procedure(s) Performed: Procedure(s) (LRB): TRANSCATHETER AORTIC VALVE REPLACEMENT, TRANSFEMORAL (Bilateral) TRANSESOPHAGEAL ECHOCARDIOGRAM (TEE) (N/A)  Patient location during evaluation: SICU Anesthesia Type: General Level of consciousness: awake, awake and alert and oriented Pain management: pain level controlled Vital Signs Assessment: post-procedure vital signs reviewed and stable Respiratory status: spontaneous breathing, nonlabored ventilation and respiratory function stable Cardiovascular status: blood pressure returned to baseline Anesthetic complications: no    Last Vitals:  Vitals:   06/26/16 1645 06/26/16 1700  BP:  (!) 153/67  Pulse: (!) 58 (!) 59  Resp: 18 16  Temp:      Last Pain:  Vitals:   06/26/16 0807  TempSrc: Oral                 Tinamarie Przybylski COKER

## 2016-06-26 NOTE — Interval H&P Note (Signed)
History and Physical Interval Note:  06/26/2016 11:31 AM  Tim Campbell  has presented today for surgery, with the diagnosis of SEVERE AS  The various methods of treatment have been discussed with the patient and family. After consideration of risks, benefits and other options for treatment, the patient has consented to  Procedure(s): TRANSCATHETER AORTIC VALVE REPLACEMENT, TRANSFEMORAL (N/A) TRANSESOPHAGEAL ECHOCARDIOGRAM (TEE) (N/A) as a surgical intervention .  The patient's history has been reviewed, patient examined, no change in status, stable for surgery.  I have reviewed the patient's chart and labs.  Questions were answered to the patient's satisfaction.     Gaye Pollack

## 2016-06-26 NOTE — Progress Notes (Signed)
Pt is stable post TAVR today. BP is slightly elevated. Will use IV NTG tonight if needed. OK to d/c arterial line tonight. Temporary pacemaker will be left in overnight. He is currently sinus brady with rate 55-60 bpm with no evidence of bundle branch block.   Lauree Chandler 06/26/2016 5:48 PM

## 2016-06-27 ENCOUNTER — Inpatient Hospital Stay (HOSPITAL_COMMUNITY): Payer: Medicare Other

## 2016-06-27 ENCOUNTER — Encounter (HOSPITAL_COMMUNITY): Payer: Self-pay | Admitting: Cardiovascular Disease

## 2016-06-27 ENCOUNTER — Other Ambulatory Visit: Payer: Self-pay | Admitting: *Deleted

## 2016-06-27 DIAGNOSIS — Z954 Presence of other heart-valve replacement: Secondary | ICD-10-CM

## 2016-06-27 DIAGNOSIS — I350 Nonrheumatic aortic (valve) stenosis: Secondary | ICD-10-CM

## 2016-06-27 LAB — TYPE AND SCREEN
ABO/RH(D): O POS
ANTIBODY SCREEN: NEGATIVE
UNIT DIVISION: 0
UNIT DIVISION: 0
Unit division: 0
Unit division: 0

## 2016-06-27 LAB — BASIC METABOLIC PANEL
ANION GAP: 6 (ref 5–15)
BUN: 11 mg/dL (ref 6–20)
CALCIUM: 8.5 mg/dL — AB (ref 8.9–10.3)
CO2: 26 mmol/L (ref 22–32)
Chloride: 104 mmol/L (ref 101–111)
Creatinine, Ser: 0.84 mg/dL (ref 0.61–1.24)
GFR calc non Af Amer: >60 mL/min (ref >60)
Glucose, Bld: 102 mg/dL — ABNORMAL HIGH (ref 65–99)
Potassium: 4.2 mmol/L (ref 3.5–5.1)
SODIUM: 136 mmol/L (ref 135–145)

## 2016-06-27 LAB — CBC
HEMATOCRIT: 41 % (ref 39.0–52.0)
HEMOGLOBIN: 13.2 g/dL (ref 13.0–17.0)
MCH: 26.9 pg (ref 26.0–34.0)
MCHC: 32.2 g/dL (ref 30.0–36.0)
MCV: 83.7 fL (ref 78.0–100.0)
Platelets: 113 10*3/uL — ABNORMAL LOW (ref 150–400)
RBC: 4.9 MIL/uL (ref 4.22–5.81)
RDW: 15.9 % — AB (ref 11.5–15.5)
WBC: 6.8 10*3/uL (ref 4.0–10.5)

## 2016-06-27 LAB — MAGNESIUM: MAGNESIUM: 2 mg/dL (ref 1.7–2.4)

## 2016-06-27 LAB — ECHOCARDIOGRAM LIMITED
Height: 69 in
Weight: 3424 oz

## 2016-06-27 MED ORDER — ISOSORBIDE MONONITRATE ER 60 MG PO TB24
90.0000 mg | ORAL_TABLET | Freq: Every day | ORAL | Status: DC
  Administered 2016-06-27 – 2016-06-28 (×2): 90 mg via ORAL
  Filled 2016-06-27: qty 3
  Filled 2016-06-27: qty 1
  Filled 2016-06-27: qty 3

## 2016-06-27 MED ORDER — METOPROLOL TARTRATE 12.5 MG HALF TABLET
12.5000 mg | ORAL_TABLET | Freq: Two times a day (BID) | ORAL | Status: DC
  Administered 2016-06-27 (×2): 12.5 mg via ORAL
  Filled 2016-06-27 (×3): qty 1

## 2016-06-27 NOTE — Progress Notes (Signed)
Transferred to 2W36 with staff in to receive. Ambulated to room SCD's with pt.

## 2016-06-27 NOTE — Op Note (Signed)
[facility]   MULTIDISCIPLINARY HEART VALVE TEAM   TAVR OPERATIVE NOTE   Date of Procedure:  06/26/2016  Preoperative Diagnosis: Severe Aortic Stenosis   Postoperative Diagnosis: Same   Procedure:    Transcatheter Aortic Valve Replacement - Percutaneous right Transfemoral Approach  Edwards Sapien 3 THV (size 29 mm, model # [id number], serial # [id number])   Co-Surgeons:  Gaye Pollack, MD and Lauree Chandler, MD       Anesthesiologist:  Roberts Gaudy, MD  Echocardiographer:  Ena Dawley, MD  Pre-operative Echo Findings:   severe aortic stenosis   mild left ventricular systolic dysfunction   Post-operative Echo Findings:  trivial paravalvular leak  Unchanged mild left ventricular systolic dysfunction    BRIEF CLINICAL NOTE AND INDICATIONS FOR SURGERY  The patient is an 82year old gentleman who underwent coronary bypass graft surgery x 5 by [name] in 1993. He did well until 2005 when he had recurrent chest pain and ruled in for a NSTEMI. Cath showed recent occlusion of the diagonal vein graft with patent vein grafts to the OM and RCA and a patent LIMA to the LAD. He has subsequently had a BMS to the proximal SVG to the RCA in 10/2010. Cathin March 2014 which showed a 70% ISR of the SVG to the RCA, 50% proximal stenosis of the SVG to the OM, chronic occlusion of the SVG to the Diagonal, and a patent LIMA to the LAD which was diffusely diseased. Anecho in September 2014 showed severe AS with a peak velocity of 4.08 m/s, a mean aortic valve gradient of 37 mm Hg, and an AVA of 0.8 cm2. There was mild AI and mild MR with an LVEF of 45-50%. I saw him in March 2015 for consideration of TAVR and he was reporting chest discomfort and shortness of breath with mild to moderate activity. He really did not want any surgery at that time but did undergo repeat cath showing a severe stenosis in the proximal OM SVG. He underwent stenting of this graft in 04/2014 and  his exertional chest pain resolved. He was still having some exertional shortness of breath but decreased his activity so that it would not bother him. He did not want surgery so we decided to continue following this. Anecho in September 2016showed moderately severe AS with a peak velocity of 3.70m/s, a mean aortic valve gradient of 76mm Hg, and an AVA of 1.03cm2. There was mild AI and mild MR with an LVEF of 45-50%. He recently saw Dr. Tamala Julian for cardiology follow up with complaints of chest pressure and shortness of breath with mild activity as well as constant fatigue. He fell and broke his wrist in April 2017 and had twosurgeries on his wrist. He denies syncope and says that he tripped and fell. He has some ankle edema. He underwent a repeat cath on 05/17/2016 showing no significant change in the appearance of his vessels or grafts compared to his last cath in 2015. His recent echo on 06/07/2016 showed an increase in his mean AV gradient to 41 mm Hg with a DI of 0.17 consistent with severe AS. LVEF was 45-82%  This 82 year old gentleman has stage D severe symptomatic aortic stenosis with progressive symptoms of exertional fatigue and shortness of breath. I have personally reviewed and interpreted his recent echo, cath and CTA studies. His aortic valve leaflets are severely calcified with poor leaflet mobility and the mean gradient has continued to rise to 41 mm Hg. His cath  shows stable bypass grafts and no reason for intervention. I think TAVR is the best treatment for him. His operative risk with open surgical AVR and CABG would be high given his advanced age, prior CABG, and comorbid factors. His cardiac CT shows an annular area of 658 which is at the upper end of the 29 mm Sapien 3 but should be acceptable. His abdominal and pelvic CT show adequate pelvic arterial anatomy for a transfemoral approach.   During the course of the patient's preoperative work up they have been evaluated comprehensively by  a multidisciplinary team of specialists coordinated through the [facility] in the [place].  They have been demonstrated to suffer from symptomatic severe aortic stenosis as noted above. The patient has been counseled extensively as to the relative risks and benefits of all options for the treatment of severe aortic stenosis including long term medical therapy, conventional surgery for aortic valve replacement, and transcatheter aortic valve replacement.  The patient has been independently evaluated by two cardiac surgeons including myself and Dr. Roxy Manns, and they are felt to be at high risk for conventional surgical aortic valve replacement based upon a predicted risk of mortality using the Society of Thoracic Surgeons risk calculator of 7.0%. Both surgeons indicated the patient would be a poor candidate for conventional surgery (predicted risk of mortality >15% and/or predicted risk of permanent morbidity >50%) because of comorbidities including advanced age, redo status.   Based upon review of all of the patient's preoperative diagnostic tests they are felt to be candidate for transcatheter aortic valve replacement using the transfemoral approach as an alternative to high risk conventional surgery.    Following the decision to proceed with transcatheter aortic valve replacement, a discussion has been held regarding what types of management strategies would be attempted intraoperatively in the event of life-threatening complications, including whether or not the patient would be considered a candidate for the use of cardiopulmonary bypass and/or conversion to open sternotomy for attempted surgical intervention.  The patient has been advised of a variety of complications that might develop peculiar to this approach including but not limited to risks of death, stroke, paravalvular leak, aortic dissection or other major vascular complications, aortic annulus  rupture, device embolization, cardiac rupture or perforation, acute myocardial infarction, arrhythmia, heart block or bradycardia requiring permanent pacemaker placement, congestive heart failure, respiratory failure, renal failure, pneumonia, infection, other late complications related to structural valve deterioration or migration, or other complications that might ultimately cause a temporary or permanent loss of functional independence or other long term morbidity.  The patient provides full informed consent for the procedure as described and all questions were answered preoperatively.    DETAILS OF THE OPERATIVE PROCEDURE  PREPARATION:    The patient is brought to the operating room on the above mentioned date and central monitoring was established by the anesthesia team including placement of Swan-Ganz catheter and radial arterial line. The patient is placed in the supine position on the operating table.  Intravenous antibiotics are administered.General endotracheal anesthesia is induced uneventfully.  A Foley catheter is placed.  Baseline transesophageal echocardiogram was performed. The patient's chest, abdomen, both groins, and both lower extremities are prepared and draped in a sterile manner. A time out procedure is performed.   PERIPHERAL ACCESS:    Performed by Dr. Shelton Silvas ACCESS:   Performed by Dr. Burtis Junes AORTIC VALVULOPLASTY:   Not performed   TRANSCATHETER HEART VALVE DEPLOYMENT:  An Edwards Sapien 3 transcatheter heart valve (size 29 mm, model #[id number], serial [id number]) was prepared and crimped per manufacturer's guidelines, and the proper orientation of the valve is confirmed on the [employer] delivery system. The valve was advanced through the introducer sheath using normal technique until in an appropriate position in the abdominal aorta beyond the sheath tip. The balloon was then retracted and using the fine-tuning wheel was centered  on the valve. The valve was then advanced across the aortic arch using appropriate flexion of the catheter. The valve was carefully positioned across the aortic valve annulus. The Commander catheter was retracted using normal technique. Once final position of the valve has been confirmed by angiographic assessment, the valve is deployed while temporarily holding ventilation and during rapid ventricular pacing to maintain systolic blood pressure < 50 mmHg and pulse pressure < 10 mmHg. The balloon inflation is held for >3 seconds after reaching full deployment volume. Once the balloon has fully deflated the balloon is retracted into the ascending aorta and valve function is assessed using echocardiography. There is felt to be trivial paravalvular leak and no central aortic insufficiency.  The patient's hemodynamic recovery following valve deployment is good.  The deployment balloon and guidewire are both removed. Echo demostrated acceptable post-procedural gradients, stable mitral valve function, and trivial aortic insufficiency.    PROCEDURE COMPLETION:   The sheath was removed and femoral artery closure performed by Dr. Angelena Form. Please see his separate report for details. Protamine was administered once femoral arterial repair was complete. The temporary pacemaker, pigtail catheters and femoral sheaths were removed with manual pressure used for hemostasis.   The patient tolerated the procedure well and is transported to the surgical intensive care in stable condition. There were no immediate intraoperative complications. All sponge instrument and needle counts are verified correct at completion of the operation.   No blood products were administered during the operation.  The patient received a total of 75.9 mL of intravenous contrast during the procedure.   Gaye Pollack, MD 06/26/2016

## 2016-06-27 NOTE — Progress Notes (Signed)
Report to 2W

## 2016-06-27 NOTE — Progress Notes (Signed)
Patient arrived to 2W ambulatory and in no acute distress.  Patient was oriented to unit and room to include call light and phone.  Telemetry monitor was applied and CCMD notified.  Will continue to monitor.

## 2016-06-27 NOTE — Progress Notes (Signed)
  Echocardiogram 2D Echocardiogram Limited S/P TAVR has been performed.  Tim Campbell M 06/27/2016, 3:15 PM

## 2016-06-27 NOTE — Progress Notes (Signed)
SUBJECTIVE: No chest pain or SOB.   Tele: sinus brady  BP (!) 122/53   Pulse 63   Temp 99.1 F (37.3 C) (Oral)   Resp 15   Ht 5\' 9"  (1.753 m)   Wt 97.1 kg (214 lb)   SpO2 97%   BMI 31.60 kg/m   Intake/Output Summary (Last 24 hours) at 06/27/16 0659 Last data filed at 06/27/16 0600  Gross per 24 hour  Intake             2474 ml  Output             3030 ml  Net             -556 ml    PHYSICAL EXAM General: Well developed, well nourished, in no acute distress. Alert and oriented x 3.  Psych:  Good affect, responds appropriately Neck: No JVD. No masses noted.  Lungs: Clear bilaterally with no wheezes or rhonci noted.  Heart: RRR with no murmurs noted. Abdomen: Bowel sounds are present. Soft, non-tender.  Extremities: No lower extremity edema.   LABS: Basic Metabolic Panel:  Recent Labs  06/26/16 1402 06/26/16 1512 06/27/16 0430  NA 140 142 136  K 3.8 3.8 4.2  CL 102  --  104  CO2  --   --  26  GLUCOSE 123* 97 102*  BUN 14  --  11  CREATININE 0.70  --  0.84  CALCIUM  --   --  8.5*  MG  --   --  2.0   CBC:  Recent Labs  06/26/16 1510 06/26/16 1512 06/27/16 0430  WBC 5.8  --  6.8  HGB 12.3* 12.9* 13.2  HCT 38.4* 38.0* 41.0  MCV 83.1  --  83.7  PLT 105*  --  113*    Current Meds: . acetaminophen  1,000 mg Oral Q6H   Or  . acetaminophen (TYLENOL) oral liquid 160 mg/5 mL  1,000 mg Per Tube Q6H  . acetaminophen (TYLENOL) oral liquid 160 mg/5 mL  650 mg Per Tube Once   Or  . acetaminophen  650 mg Rectal Once  . aspirin EC  81 mg Oral Daily  . atorvastatin  40 mg Oral Daily  . B-complex with vitamin C  1 tablet Oral Once per day on Mon Wed Fri  . cefUROXime (ZINACEF)  IV  1.5 g Intravenous Q12H  . clopidogrel  75 mg Oral Q breakfast  . finasteride  5 mg Oral Daily  . folic acid  [id number] mcg Oral QODAY  . hydrochlorothiazide  12.5 mg Oral Daily  . levothyroxine  125 mcg Oral QAC breakfast  . magnesium oxide  200 mg Oral Daily  . mouth rinse  15  mL Mouth Rinse BID  . [START ON 06/28/2016] pantoprazole  40 mg Oral Daily  . tamsulosin  0.4 mg Oral BID  . vitamin C  1,000 mg Oral QODAY     ASSESSMENT AND PLAN:  1. Severe aortic valve stenosis: He is POD #1 s/p TAVR from the right transfemoral approach. He is stable this am. BP is stable. He is sinus with HR in the 60s. He did not require his temp pacer overnight. Urine output is good.  -Will d/c central line, foley catheter, temp pacing wire, arterial line -D/c NTG drip -Will start Plavix this am -Resume home dose of beta blocker today -Resume Imdur -Ambulate -Transfer to telemetry unit later this am -echo later today  [employer]  9/20/20176:59  AM

## 2016-06-27 NOTE — Care Management Note (Signed)
Case Management Note  Patient Details  Name: WILFRID MAGUIRE MRN: [id number] Date of Birth: 1926-04-23  Subjective/Objective:         Pt is s/p TVAR       Action/Plan:  PTA independent from home with wife (both retired),  Already has walker and cane in the home but hasn't needed to use either.  Pt has 3 adult daughters that are supportive.  CM will continue to follow for discharge needs   Expected Discharge Date:                  Expected Discharge Plan:  Home/Self Care  In-House Referral:     Discharge planning Services  CM Consult  Post Acute Care Choice:    Choice offered to:     DME Arranged:    DME Agency:     HH Arranged:    HH Agency:     Status of Service:  In process, will continue to follow  If discussed at Long Length of Stay Meetings, dates discussed:    Additional Comments:  Maryclare Labrador, RN 06/27/2016, 10:53 AM

## 2016-06-27 NOTE — Progress Notes (Signed)
1 Day Post-Op Procedure(s) (LRB): TRANSCATHETER AORTIC VALVE REPLACEMENT, TRANSFEMORAL (Bilateral) TRANSESOPHAGEAL ECHOCARDIOGRAM (TEE) (N/A) Subjective:  Feels well and says he is ready to go home.   Rhythm sinus 70. Temporary pacer is out.  Objective: Vital signs in last 24 hours: Temp:  [98.5 F (36.9 C)-99.2 F (37.3 C)] 99.1 F (37.3 C) (09/20 0836) Pulse Rate:  [0-108] 63 (09/20 1100) Cardiac Rhythm: Normal sinus rhythm (09/20 0745) Resp:  [12-26] 22 (09/20 1100) BP: (111-158)/(46-120) 119/46 (09/20 1100) SpO2:  [92 %-100 %] 99 % (09/20 1100) Arterial Line BP: (106-176)/(38-69) 123/49 (09/20 0800) Weight:  [97.1 kg (214 lb)] 97.1 kg (214 lb) (09/20 0500)  Hemodynamic parameters for last 24 hours:    Intake/Output from previous day: 09/19 0701 - 09/20 0700 In: 2474 [I.V.:1824; IV Piggyback:650] Out: [id number] [Urine:2930; Blood:100] Intake/Output this shift: Total I/O In: 360 [P.O.:360] Out: 900 [Urine:900]  General appearance: alert and cooperative Neurologic: intact Heart: regular rate and rhythm, S1, S2 normal, no murmur, click, rub or gallop Lungs: clear to auscultation bilaterally Extremities: extremities normal, atraumatic, no cyanosis or edema Wound: groin access sites ok.  Lab Results:  Recent Labs  06/26/16 1510 06/26/16 1512 06/27/16 0430  WBC 5.8  --  6.8  HGB 12.3* 12.9* 13.2  HCT 38.4* 38.0* 41.0  PLT 105*  --  113*   BMET:  Recent Labs  06/26/16 1402 06/26/16 1512 06/27/16 0430  NA 140 142 136  K 3.8 3.8 4.2  CL 102  --  104  CO2  --   --  26  GLUCOSE 123* 97 102*  BUN 14  --  11  CREATININE 0.70  --  0.84  CALCIUM  --   --  8.5*    PT/INR:  Recent Labs  06/26/16 1510  LABPROT 17.0*  INR 1.37   ABG    Component Value Date/Time   PHART 7.372 06/26/2016 1517   HCO3 30.1 (H) 06/26/2016 1517   TCO2 32 06/26/2016 1517   O2SAT 96.0 06/26/2016 1517   CBG (last 3)  No results for input(s): GLUCAP in the last 72 hours.  CXR:  clear  Assessment/Plan: S/P Procedure(s) (LRB): TRANSCATHETER AORTIC VALVE REPLACEMENT, TRANSFEMORAL (Bilateral) TRANSESOPHAGEAL ECHOCARDIOGRAM (TEE) (N/A)  He is hemodynamically stable after TAVR yesterday. Rhythm is stable in sinus with resolution of BBB.   Plan 2D echo today. Mobilize ASA and Plavix. Transfer to 2W today, possibly home tomorrow.   LOS: 1 day    Gaye Pollack 06/27/2016

## 2016-06-27 NOTE — Progress Notes (Signed)
CARDIAC REHAB PHASE I   PRE:  Rate/Rhythm: 68 SR  BP:  Sitting: 79/56        SaO2: 100 RA  MODE:  Ambulation: 450 ft   POST:  Rate/Rhythm: 62 SR  BP:  Sitting: 97/49         SaO2: 97 RA  Pt ambulated 450 ft on RA, assist x1, mostly steady gait, tolerated well with no complaints. Pt to bed per pt request after walk, call bell within reach. Will follow.   [place], RN, BSN 06/27/2016 2:15 PM

## 2016-06-28 ENCOUNTER — Telehealth: Payer: Self-pay | Admitting: Cardiovascular Disease

## 2016-06-28 ENCOUNTER — Encounter (HOSPITAL_COMMUNITY): Payer: Self-pay | Admitting: Physician Assistant

## 2016-06-28 DIAGNOSIS — I714 Abdominal aortic aneurysm, without rupture, unspecified: Secondary | ICD-10-CM | POA: Diagnosis present

## 2016-06-28 DIAGNOSIS — I959 Hypotension, unspecified: Secondary | ICD-10-CM

## 2016-06-28 DIAGNOSIS — D696 Thrombocytopenia, unspecified: Secondary | ICD-10-CM

## 2016-06-28 DIAGNOSIS — Z952 Presence of prosthetic heart valve: Secondary | ICD-10-CM

## 2016-06-28 DIAGNOSIS — I4510 Unspecified right bundle-branch block: Secondary | ICD-10-CM

## 2016-06-28 DIAGNOSIS — E039 Hypothyroidism, unspecified: Secondary | ICD-10-CM

## 2016-06-28 LAB — CBC
HEMATOCRIT: 42.6 % (ref 39.0–52.0)
HEMOGLOBIN: 13.6 g/dL (ref 13.0–17.0)
MCH: 27.1 pg (ref 26.0–34.0)
MCHC: 31.9 g/dL (ref 30.0–36.0)
MCV: 85 fL (ref 78.0–100.0)
Platelets: 112 10*3/uL — ABNORMAL LOW (ref 150–400)
RBC: 5.01 MIL/uL (ref 4.22–5.81)
RDW: 16.1 % — ABNORMAL HIGH (ref 11.5–15.5)
WBC: 7.7 10*3/uL (ref 4.0–10.5)

## 2016-06-28 LAB — BASIC METABOLIC PANEL
ANION GAP: 9 (ref 5–15)
BUN: 14 mg/dL (ref 6–20)
CO2: 27 mmol/L (ref 22–32)
Calcium: 9 mg/dL (ref 8.9–10.3)
Chloride: 103 mmol/L (ref 101–111)
Creatinine, Ser: 1.01 mg/dL (ref 0.61–1.24)
GFR calc Af Amer: >60 mL/min (ref >60)
GLUCOSE: 101 mg/dL — AB (ref 65–99)
POTASSIUM: 4.5 mmol/L (ref 3.5–5.1)
Sodium: 139 mmol/L (ref 135–145)

## 2016-06-28 MED ORDER — METOPROLOL TARTRATE 25 MG PO TABS: 12.5000 mg | ORAL_TABLET | Freq: Two times a day (BID) | ORAL | 3 refills | Status: DC

## 2016-06-28 MED ORDER — CLOPIDOGREL BISULFATE 75 MG PO TABS
75.0000 mg | ORAL_TABLET | Freq: Every day | ORAL | 6 refills | Status: DC
Start: 2016-06-28 — End: 2016-12-19

## 2016-06-28 NOTE — Progress Notes (Signed)
SUBJECTIVE: No complaints. No dyspnea or chest pain.   Tele: sinus, rate 65 bpm  BP (!) 131/59 (BP Location: Left Arm)   Pulse 60   Temp 98 F (36.7 C) (Oral)   Resp 20   Ht 5\' 9"  (1.753 m)   Wt 94.2 kg (207 lb 9.6 oz)   SpO2 97%   BMI 30.66 kg/m   Intake/Output Summary (Last 24 hours) at 06/28/16 0744 Last data filed at 06/27/16 1200  Gross per 24 hour  Intake              540 ml  Output             1175 ml  Net             -635 ml    PHYSICAL EXAM General: Well developed, well nourished, in no acute distress. Alert and oriented x 3.  Psych:  Good affect, responds appropriately Neck: No JVD. No masses noted.  Lungs: Clear bilaterally with no wheezes or rhonci noted.  Heart: RRR with no murmurs noted. Abdomen: Bowel sounds are present. Soft, non-tender.  Extremities: No lower extremity edema.   LABS: Basic Metabolic Panel:  Recent Labs  06/27/16 0430 06/28/16 0256  NA 136 139  K 4.2 4.5  CL 104 103  CO2 26 27  GLUCOSE 102* 101*  BUN 11 14  CREATININE 0.84 1.01  CALCIUM 8.5* 9.0  MG 2.0  --    CBC:  Recent Labs  06/27/16 0430 06/28/16 0256  WBC 6.8 7.7  HGB 13.2 13.6  HCT 41.0 42.6  MCV 83.7 85.0  PLT 113* 112*    Current Meds: . acetaminophen  1,000 mg Oral Q6H   Or  . acetaminophen (TYLENOL) oral liquid 160 mg/5 mL  1,000 mg Per Tube Q6H  . acetaminophen (TYLENOL) oral liquid 160 mg/5 mL  650 mg Per Tube Once   Or  . acetaminophen  650 mg Rectal Once  . aspirin EC  81 mg Oral Daily  . atorvastatin  40 mg Oral Daily  . B-complex with vitamin C  1 tablet Oral Once per day on Mon Wed Fri  . clopidogrel  75 mg Oral Q breakfast  . finasteride  5 mg Oral Daily  . folic acid  [id number] mcg Oral QODAY  . hydrochlorothiazide  12.5 mg Oral Daily  . isosorbide mononitrate  90 mg Oral Daily  . levothyroxine  125 mcg Oral QAC breakfast  . magnesium oxide  200 mg Oral Daily  . mouth rinse  15 mL Mouth Rinse BID  . metoprolol tartrate  12.5 mg  Oral BID  . pantoprazole  40 mg Oral Daily  . tamsulosin  0.4 mg Oral BID  . vitamin C  1,000 mg Oral QODAY   Echo 06/27/16: Left ventricle: The cavity size was normal. Wall thickness was   increased in a pattern of moderate LVH. Systolic function was   mildly to moderately reduced. The estimated ejection fraction was   in the range of 40% to 45%. Akinesis of the basalanteroseptal   myocardium. Doppler parameters are consistent with abnormal left   ventricular relaxation (grade 1 diastolic dysfunction). Aortic valve:  The TAVR appears to be functioning normally. A bioprosthesis was present.  Doppler:   There was no stenosis. There was no regurgitation.    VTI ratio of LVOT to aortic valve: 0.24. Valve area (VTI): 0.9 cm^2. Indexed valve area (VTI): 0.41 cm^2/m^2. Peak velocity ratio of LVOT to  aortic valve: 0.29. Valve area (Vmax): 1.11 cm^2. Indexed valve area (Vmax): 0.5 cm^2/m^2. Mean velocity ratio of LVOT to aortic valve: 0.25. Valve area (Vmean): 0.94 cm^2. Indexed valve area (Vmean): 0.42 cm^2/m^2. Mean gradient (S): 13 mm Hg. Peak gradient (S): 22 mm Hg.  ASSESSMENT AND PLAN:  1. Severe aortic valve stenosis: He is POD #2 s/p TAVR from the right transfemoral approach. He is stable this am. BP is stable. He is sinus with HR in the 60s. Urine output is good. He is passing his urine ok. He is on ASA and Plavix. Echo with normally functioning bioprosthetic valve. He is back on beta blocker and tolerating.   D/C home today. He will need office follow up in one week with office APP at [facility] and one month follow up with me with echo day of f/u with me.    Lauree Chandler  9/21/20177:44 AM

## 2016-06-28 NOTE — Telephone Encounter (Signed)
New message     TOC appt on  10.2.2017  With Tim Campbell.

## 2016-06-28 NOTE — Progress Notes (Signed)
CARDIAC REHAB PHASE I   PRE:  Rate/Rhythm: 63 SR  BP:  Supine:   Sitting: 99/48  Standing:    SaO2: 98%RA  MODE:  Ambulation: 550 ft   POST:  Rate/Rhythm: 70 SR  BP:  Supine:   Sitting: 127/53  Standing:    SaO2: 100%RA 0907-1000 Pt walked 550 ft with hand held asst. Gait steady and tolerated well. To recliner after walk. Pt's daughter In room for education also. Reviewed ex ed and encouraged IS and low sodium diet. Gave low sodium handout. Discussed CRP 2 and will refer to [place]. Pt was able to demonstrate correct use of IS after instruction.   Graylon Good, RN BSN  06/28/2016 10:00 AM

## 2016-06-28 NOTE — Discharge Summary (Signed)
Discharge Summary    Patient ID: Tim Campbell,  MRN: [id number], DOB/AGE: [birth year] 82 y.o.  Admit date: 06/26/2016 Discharge date: 06/28/2016  Primary Care Provider: GATES,ROBERT NEVILL Primary Cardiologist: Dr. Tamala Julian; TAVR: Dr. Angelena Form  Discharge Diagnoses    Principal Problem:   Severe aortic valve stenosis Active Problems:   S/P TAVR (transcatheter aortic valve replacement)   CAD (coronary artery disease) of artery bypass graft   HTN (hypertension)   Chronic combined systolic and diastolic HF (heart failure), NYHA class 3 (HCC)   Hyperlipidemia   Hypotension   Thrombocytopenia (HCC)   Hypothyroidism   RBBB    Diagnostic Studies/Procedures    1. TAVR this admission - see op note 06/26/16 2. Limited 2D Echo 06/27/16 - Study Conclusions - Left ventricle: The cavity size was normal. Wall thickness was increased in a pattern of moderate LVH. Systolic function was mildly to moderately reduced. The estimated ejection fraction was in the range of 40% to 45%. Akinesis of the basalanteroseptal myocardium. Doppler parameters are consistent with abnormal left ventricular relaxation (grade 1 diastolic dysfunction). - Aortic valve: A bioprosthesis was present. Valve area (VTI): 0.9 cm^2. Valve area (Vmax): 1.11 cm^2. Valve area (Vmean): 0.94 cm^2. - Mitral valve: Calcified annulus. Mildly thickened leaflets . - Left atrium: The atrium was severely dilated. _____________   History of Present Illness & Hospital Course    Mr. Ludwig is an 82 y/o M with history of severe aortic stenosis, CAD (s/p CABGx5 in 1993, NSTEMI with occluded VG-diag in 2005, BMS to prox VG-PDA 2012, BMS to SVG-OM 03/2014), HTN, hyperlipidemia, hypothyroidism, large cell non-Hodgkins lymphoma, RBBB, chronic combined CHF, arthritis, diverticulosis, enlarged prostate, RBBB, pulm nodules and small AAA by CT 05/2016 who presented to [facility] for planned TAVR. His workup for TAVR started in 2015 due to chest pain and  dyspnea but deferred after he had symptomatic improvement after PCI as above. He tripped and fell and broke his wrist in 01/2016 and had to have 2 surgeries. He's since been followed closely and developed recurrent symptoms of chest pain and SOB. He underwent repeat cath 05/17/2016 showing stable appearance to 2015 with severe native disease, patent LIMA-LAD and SVG-OM, patent SVG-PDA & PL with 50% ISR at ostium of SVG similar to before, chronically occluded SVG-diag, normal right heart filling pressures and mild-mod elevated LV filling pressure, mod-severe AS. 2D echo 06/07/16: EF 45-50% with inf WMA, grade 1 DD, severe AS, mild-mod AI, mean gradient 41, mild MR, mod LAE/RAE, RV systolic function mildly reduced. Given his advanced age and comorbidities he was not felt to be a good candidate for conventional AVR by surgical approach. He was seen by CVTS as well as part of his multidisciplinary valve workup. TAVR was recommended as a therapeutic approach.  He was admitted 06/26/16 for this procedure and underwent Transcatheter Aortic Valve Replacement - Transfemoral Approach with Edwards Sapien 3 THV (size 29 mm, model # [id number], serial [id number]). He was started on Plavix. He was taking Lopressor 25mg  once daily and this was split into 1/2 tab BID. Post hospital course notable for fluctuating BP both high and low but no sig complications noted. BP 131/59 this AM. He had expected post-op mild thrombocytopenia. F/u limited echo 06/27/16 showed mod LVH, EF 40-45%, akinesis of basal anteroseptal myocardium, grade 1 DD, AV bioprosthesis present without complication, severely dilated LA. HE ambulated well with cardiac rehab and was referred to phase 2. Dr. Angelena Form has seen and examined the patient today and feels he is  stable for discharge. He will have f/u echo and appt with Dr. Angelena Form on 08/03/16. Will also arrange TOC visit in 1 week.  Of note his CT abd/angio 06/06/16 showed 2 pulm nodules of the left lung -  noncontrast CT recommended in 3-6 months. An incidental AAA of 3.3x3.0cm was found as well. Will review further w/u needed upon follow-up.  Consultants: CVTS  _____________  Discharge Vitals Blood pressure (!) 131/59, pulse 60, temperature 98 F (36.7 C), temperature source Oral, resp. rate 20, height 5\' 9"  (1.753 m), weight 207 lb 9.6 oz (94.2 kg), SpO2 97 %.  Filed Weights   06/26/16 0807 06/27/16 0500 06/28/16 0513  Weight: 213 lb (96.6 kg) 214 lb (97.1 kg) 207 lb 9.6 oz (94.2 kg)    Labs & Radiologic Studies    CBC  Recent Labs  06/27/16 0430 06/28/16 0256  WBC 6.8 7.7  HGB 13.2 13.6  HCT 41.0 42.6  MCV 83.7 85.0  PLT 113* [id number]*   Basic Metabolic Panel  Recent Labs  06/27/16 0430 06/28/16 0256  NA 136 139  K 4.2 4.5  CL 104 103  CO2 26 27  GLUCOSE 102* 101*  BUN 11 14  CREATININE 0.84 1.01  CALCIUM 8.5* 9.0  MG 2.0  --    _____________  Dg Chest 2 View  Result Date: 06/22/2016 CLINICAL DATA:  Pre-admission for CABG EXAM: CHEST  2 VIEW COMPARISON:  01/01/2013 FINDINGS: Cardiomediastinal silhouette is stable. Status post median sternotomy. No acute infiltrate or pleural effusion. No pulmonary edema. Osteopenia and mild degenerative changes thoracic spine. Atherosclerotic calcifications of thoracic aorta. IMPRESSION: No active cardiopulmonary disease. Electronically Signed   By: Lahoma Crocker M.D.   On: 06/22/2016 13:03   Ct Coronary Morph W/cta Cor W/score W/ca W/cm &/or Wo/cm  Addendum Date: 06/06/2016   ADDENDUM REPORT: 06/06/2016 14:17 CLINICAL DATA:  82 year old male with severe aortic stenosis. EXAM: Cardiac TAVR CT TECHNIQUE: The patient was scanned on a Philips 256 scanner. A 120 kV retrospective scan was triggered in the descending thoracic aorta at 111 HU's. Gantry rotation speed was 270 msecs and collimation was .9 mm. No beta blockade or nitro were given. The 3D data set was reconstructed in 5% intervals of the R-R cycle. Systolic and diastolic phases were  analyzed on a dedicated work station using MPR, MIP and VRT modes. The patient received 80 cc of contrast. FINDINGS: Aortic Valve: Trileaflet, severely thickened and calcified aortic valve with severely restricted leaflet opening. There are mildl calcifications extending into the LVOT predominantly under the left coronary cusp. Aorta: Aortic root is mildly dilated measuring 3mm in its widest diameter, ascending aortic size is at upper normal limit measuring 40 mm. There are moderate diffuse calcifications and no dissection. Sinotubular Junction:  35 x 35 mm Ascending Thoracic Aorta:  40 x 38 mm Aortic Arch:  32 x 29 mm Descending Thoracic Aorta:  31 x 28 mm Sinus of Valsalva is mildly dilated with measurements: Non-coronary:  41 mm Right -coronary:  41 mm Left -coronary:  44 mm Coronary Artery Height above Annulus: Left Main:  12 mm Right Coronary:  19 mm Virtual Basal Annulus Measurements: Maximum / Minimum Diameter:  34 x 25 mm Perimeter:  111 mm Area:  658 mm2 Optimum Fluoroscopic Angle for Delivery:  RAO 8 CRA 4 IMPRESSION: 1. Trileaflet, severely thickened and calcified aortic valve with severely restricted leaflet opening and mild calcification sin the LVOT. Annular measurements at the higher end of 29 mm Edward-SAPIEN 3 valve  but still within acceptable range. 2. Aortic root is mildly dilated and ascending aorta is at the upper normal limit. 3. Sufficient annulus to coronary distance. 4. Optimum Fluoroscopic Angle for Delivery:  RAO 8 CRA 4 Ena Dawley Electronically Signed   By: Ena Dawley   On: 06/06/2016 14:17   Result Date: 06/06/2016 EXAM: OVER-READ INTERPRETATION  CT CHEST The following report is an over-read performed by radiologist Dr. Rebekah Chesterfield [facility] on 06/06/2016. This over-read does not include interpretation of cardiac or coronary anatomy or pathology. The coronary calcium score/coronary CTA interpretation by the cardiologist is attached. COMPARISON:  None.  FINDINGS: Extracardiac findings will be dictated separately with CTA of the chest, abdomen and pelvis obtained contemporaneously on 06/06/2016. IMPRESSION: See separate dictation for CTA of the chest, abdomen and pelvis performed on 06/06/2016 for full description of extracardiac findings. Electronically Signed: By: Vinnie Langton M.D. On: 06/06/2016 12:35   Dg Chest Port 1 View  Result Date: 06/27/2016 CLINICAL DATA:  Chest pain and soreness. Previous percutaneous aortic valve procedure. EXAM: PORTABLE CHEST 1 VIEW COMPARISON:  06/26/2016 FINDINGS: Right internal jugular central line is unchanged with its tip in the SVC above the right atrium. Previous median sternotomy and CABG. Previous percutaneous aortic valve replacement. Chronic left ventricular prominence as seen previously. Improved aeration with diminishing atelectasis at the bases. No heart failure. No effusion. IMPRESSION: Radiographic improvement in pulmonary aeration. Electronically Signed   By: Nelson Chimes M.D.   On: 06/27/2016 08:11   Dg Chest Port 1 View  Result Date: 06/26/2016 CLINICAL DATA:  Aortic valve replacement. EXAM: PORTABLE CHEST 1 VIEW COMPARISON:  06/22/2016. FINDINGS: Right IJ catheter noted with tip at cavoatrial junction. Right femoral pacing lead noted projected over the right ventricle. Prior CABG. Prior valve replacement. Cardiomegaly normal pulmonary vascularity. Low lung volumes with bilateral multifocal areas of subsegmental atelectasis and or infiltrates. No prominent pleural effusion. Tiny left pleural effusion cannot be excluded. No pneumothorax P IMPRESSION: 1. Right IJ line and right femoral pacing device in good anatomic position. 2. Prior CABG. Prior cardiac valve replacement. No pulmonary venous congestion. 3. Low lung volumes with multifocal bilateral subsegmental atelectasis and or mild infiltrates. Electronically Signed   By: Marcello Moores  Register   On: 06/26/2016 15:14   Ct Angio Chest Aorta W/cm &/or  Wo/cm  Result Date: 06/06/2016 CLINICAL DATA:  82 year old male with history of severe aortic stenosis. Preprocedural study prior to potential transcatheter aortic valve replacement (TAVR) procedure. EXAM: CT ANGIOGRAPHY CHEST, ABDOMEN AND PELVIS TECHNIQUE: Multidetector CT imaging through the chest, abdomen and pelvis was performed using the standard protocol during bolus administration of intravenous contrast. Multiplanar reconstructed images and MIPs were obtained and reviewed to evaluate the vascular anatomy. CONTRAST:  150 mL of Isovue-300. COMPARISON:  PET-CT 06/17/2006 FINDINGS: CTA CHEST FINDINGS Cardiovascular: Heart size is enlarged with left ventricular and left atrial dilatation. There is no significant pericardial fluid, thickening or pericardial calcification. There is aortic atherosclerosis, as well as atherosclerosis of the great vessels of the mediastinum and the coronary arteries, including calcified atherosclerotic plaque in the left main, left anterior descending, left circumflex and right coronary arteries. Status post median sternotomy for CABG, including LIMA to the LAD. Extremely severe calcification of the aortic valve. Mediastinum/Nodes: No pathologically enlarged mediastinal or hilar lymph nodes. Esophagus is unremarkable in appearance. No axillary lymphadenopathy. Lungs/Pleura: 11 x 9 mm (mean diameter of 10 mm) pulmonary nodule in the medial aspect of the left lower lobe (image 72 of series  407). 6 mm pulmonary nodule in the inferior segment of the lingula (image 70 of series 407). No acute consolidative airspace disease. No pleural effusions. Musculoskeletal: Median sternotomy wires. There are no aggressive appearing lytic or blastic lesions noted in the visualized portions of the skeleton. Multiple old healed inferior left-sided rib fractures are incidentally noted. CTA ABDOMEN AND PELVIS FINDINGS Hepatobiliary: Small calcified granuloma in segment 4A. No other suspicious hepatic  lesions. No intra or extrahepatic biliary ductal dilatation. Gallbladder is normal in appearance. Pancreas: No pancreatic mass. No pancreatic ductal dilatation. No pancreatic or peripancreatic fluid or inflammatory changes. Spleen: Unremarkable. Adrenals/Urinary Tract: Multiple low-attenuation lesions in the kidneys bilaterally, too small to characterize, but statistically likely cysts. 2.1 cm simple cyst in the posterior aspect of the interpolar region of the right kidney. No suspicious renal lesions. No hydroureteronephrosis. Bilateral adrenal glands are normal in appearance. Urinary bladder is heavily trabeculated, with multiple tiny bladder wall diverticulae. Stomach/Bowel: The appearance of the stomach is normal. No pathologic dilatation of small bowel or colon. Numerous colonic diverticulae are noted, particularly in the descending colon and sigmoid colon, without surrounding inflammatory changes to suggest an acute diverticulitis at this time. The appendix is not confidently identified and may be surgically absent. Regardless, there are no inflammatory changes noted adjacent to the cecum to suggest the presence of an acute appendicitis at this time. Vascular/Lymphatic: Aortic atherosclerosis, with vascular findings and measurements pertinent to potential TAVR procedure, as detailed below. In addition, there is short segment fusiform aneurysmal dilatation of the infrarenal abdominal aorta which measures up to 3.3 x 3.0 cm. There is also aneurysmal dilatation of the common iliac arteries bilaterally measuring up to 2 cm on the right and 2.4 cm on the left. No lymphadenopathy noted in the abdomen or pelvis. Reproductive: Prostate gland is enlarged measuring 5.3 x 5.4 x 5.5 cm. Seminal vesicles are unremarkable in appearance. Other: No significant volume of ascites.  No pneumoperitoneum. Musculoskeletal: There are no aggressive appearing lytic or blastic lesions noted in the visualized portions of the skeleton.  VASCULAR MEASUREMENTS PERTINENT TO TAVR: AORTA: Minimal Aortic Diameter -  17 x 18 mm Severity of Aortic Calcification -  severe RIGHT PELVIS: Right Common Iliac Artery - Minimal Diameter - 11.5 x 8.0 mm Tortuosity - mild Calcification - moderate to severe Right External Iliac Artery - Minimal Diameter - 9.5 x 10.0 mm Tortuosity - moderate to severe Calcification - minimal Right Common Femoral Artery - Minimal Diameter - 10.6 x 5.9 mm Tortuosity - mild Calcification - moderate LEFT PELVIS: Left Common Iliac Artery - Minimal Diameter - 9.3 x 9.2 mm Tortuosity - moderate Calcification - moderate to severe Left External Iliac Artery - Minimal Diameter - 11.1 x 10.1 mm Tortuosity - moderate to severe Calcification - minimal Left Common Femoral Artery - Minimal Diameter - 11.1 x 6.1 mm Tortuosity - mild Calcification - mild to moderate Review of the MIP images confirms the above findings. IMPRESSION: 1. Vascular findings and measurements pertinent to potential TAVR procedure, as detailed above. Patient has suitable pelvic arterial access bilaterally. 2. Extremely severe calcifications of the aortic valve, compatible with the reported clinical history of severe aortic stenosis. 3. **An incidental finding of potential clinical significance has been found. Two pulmonary nodules are noted in the left lung, the largest of which has a mean diameter of 1 cm. Non-contrast chest CT at 3-6 months is recommended. If the nodules are stable at time of repeat CT, then future CT at 18-24 months (from  today's scan) is considered optional for low-risk patients, but is recommended for high-risk patients. This recommendation follows the consensus statement: Guidelines for Management of Incidental Pulmonary Nodules Detected on CT Images:From the Fleischner Society 2017; published online before print (10.1148/radiol.IJ:2314499).** 4. **An incidental finding of potential clinical significance has been found. Abdominal aortic aneurysm  measuring 3.3 x 3.0 cm. Recommend followup by ultrasound in 3 years. This recommendation follows ACR consensus guidelines: White Paper of the ACR Incidental Findings Committee II on Vascular Findings. J Am Coll Radiol 2013; 10:789-794. ** 5. Colonic diverticulosis without evidence of acute diverticulitis at this time. 6. Additional incidental findings, as above. Electronically Signed   By: Vinnie Langton M.D.   On: 06/06/2016 13:23   Ct Angio Abd/pel W/ And/or W/o  Result Date: 06/06/2016 CLINICAL DATA:  82 year old male with history of severe aortic stenosis. Preprocedural study prior to potential transcatheter aortic valve replacement (TAVR) procedure. EXAM: CT ANGIOGRAPHY CHEST, ABDOMEN AND PELVIS TECHNIQUE: Multidetector CT imaging through the chest, abdomen and pelvis was performed using the standard protocol during bolus administration of intravenous contrast. Multiplanar reconstructed images and MIPs were obtained and reviewed to evaluate the vascular anatomy. CONTRAST:  150 mL of Isovue-300. COMPARISON:  PET-CT 06/17/2006 FINDINGS: CTA CHEST FINDINGS Cardiovascular: Heart size is enlarged with left ventricular and left atrial dilatation. There is no significant pericardial fluid, thickening or pericardial calcification. There is aortic atherosclerosis, as well as atherosclerosis of the great vessels of the mediastinum and the coronary arteries, including calcified atherosclerotic plaque in the left main, left anterior descending, left circumflex and right coronary arteries. Status post median sternotomy for CABG, including LIMA to the LAD. Extremely severe calcification of the aortic valve. Mediastinum/Nodes: No pathologically enlarged mediastinal or hilar lymph nodes. Esophagus is unremarkable in appearance. No axillary lymphadenopathy. Lungs/Pleura: 11 x 9 mm (mean diameter of 10 mm) pulmonary nodule in the medial aspect of the left lower lobe (image 72 of series 407). 6 mm pulmonary nodule in the  inferior segment of the lingula (image 70 of series 407). No acute consolidative airspace disease. No pleural effusions. Musculoskeletal: Median sternotomy wires. There are no aggressive appearing lytic or blastic lesions noted in the visualized portions of the skeleton. Multiple old healed inferior left-sided rib fractures are incidentally noted. CTA ABDOMEN AND PELVIS FINDINGS Hepatobiliary: Small calcified granuloma in segment 4A. No other suspicious hepatic lesions. No intra or extrahepatic biliary ductal dilatation. Gallbladder is normal in appearance. Pancreas: No pancreatic mass. No pancreatic ductal dilatation. No pancreatic or peripancreatic fluid or inflammatory changes. Spleen: Unremarkable. Adrenals/Urinary Tract: Multiple low-attenuation lesions in the kidneys bilaterally, too small to characterize, but statistically likely cysts. 2.1 cm simple cyst in the posterior aspect of the interpolar region of the right kidney. No suspicious renal lesions. No hydroureteronephrosis. Bilateral adrenal glands are normal in appearance. Urinary bladder is heavily trabeculated, with multiple tiny bladder wall diverticulae. Stomach/Bowel: The appearance of the stomach is normal. No pathologic dilatation of small bowel or colon. Numerous colonic diverticulae are noted, particularly in the descending colon and sigmoid colon, without surrounding inflammatory changes to suggest an acute diverticulitis at this time. The appendix is not confidently identified and may be surgically absent. Regardless, there are no inflammatory changes noted adjacent to the cecum to suggest the presence of an acute appendicitis at this time. Vascular/Lymphatic: Aortic atherosclerosis, with vascular findings and measurements pertinent to potential TAVR procedure, as detailed below. In addition, there is short segment fusiform aneurysmal dilatation of the infrarenal abdominal aorta which measures up  to 3.3 x 3.0 cm. There is also aneurysmal  dilatation of the common iliac arteries bilaterally measuring up to 2 cm on the right and 2.4 cm on the left. No lymphadenopathy noted in the abdomen or pelvis. Reproductive: Prostate gland is enlarged measuring 5.3 x 5.4 x 5.5 cm. Seminal vesicles are unremarkable in appearance. Other: No significant volume of ascites.  No pneumoperitoneum. Musculoskeletal: There are no aggressive appearing lytic or blastic lesions noted in the visualized portions of the skeleton. VASCULAR MEASUREMENTS PERTINENT TO TAVR: AORTA: Minimal Aortic Diameter -  17 x 18 mm Severity of Aortic Calcification -  severe RIGHT PELVIS: Right Common Iliac Artery - Minimal Diameter - 11.5 x 8.0 mm Tortuosity - mild Calcification - moderate to severe Right External Iliac Artery - Minimal Diameter - 9.5 x 10.0 mm Tortuosity - moderate to severe Calcification - minimal Right Common Femoral Artery - Minimal Diameter - 10.6 x 5.9 mm Tortuosity - mild Calcification - moderate LEFT PELVIS: Left Common Iliac Artery - Minimal Diameter - 9.3 x 9.2 mm Tortuosity - moderate Calcification - moderate to severe Left External Iliac Artery - Minimal Diameter - 11.1 x 10.1 mm Tortuosity - moderate to severe Calcification - minimal Left Common Femoral Artery - Minimal Diameter - 11.1 x 6.1 mm Tortuosity - mild Calcification - mild to moderate Review of the MIP images confirms the above findings. IMPRESSION: 1. Vascular findings and measurements pertinent to potential TAVR procedure, as detailed above. Patient has suitable pelvic arterial access bilaterally. 2. Extremely severe calcifications of the aortic valve, compatible with the reported clinical history of severe aortic stenosis. 3. **An incidental finding of potential clinical significance has been found. Two pulmonary nodules are noted in the left lung, the largest of which has a mean diameter of 1 cm. Non-contrast chest CT at 3-6 months is recommended. If the nodules are stable at time of repeat CT, then  future CT at 18-24 months (from today's scan) is considered optional for low-risk patients, but is recommended for high-risk patients. This recommendation follows the consensus statement: Guidelines for Management of Incidental Pulmonary Nodules Detected on CT Images:From the Fleischner Society 2017; published online before print (10.1148/radiol.IJ:2314499).** 4. **An incidental finding of potential clinical significance has been found. Abdominal aortic aneurysm measuring 3.3 x 3.0 cm. Recommend followup by ultrasound in 3 years. This recommendation follows ACR consensus guidelines: White Paper of the ACR Incidental Findings Committee II on Vascular Findings. J Am Coll Radiol 2013; 10:789-794. ** 5. Colonic diverticulosis without evidence of acute diverticulitis at this time. 6. Additional incidental findings, as above. Electronically Signed   By: Vinnie Langton M.D.   On: 06/06/2016 13:23   Disposition   Pt is being discharged home today in good condition.  Follow-up Plans & Appointments    Follow-up Information    [facility] .   Specialty:  Cardiology Why:  See appointments below: 1) You will follow up on 07/09/16 at 10:30am with Melina Copa, one of the PAs that works with Dr. Angelena Form. 2) Later that month on 08/03/16 you will have a heart ultrasound at 9:30am and appointment with Dr. Angelena Form on 10:30am. Contact information: [address], [place] [address] [phone]         Discharge Instructions    Amb Referral to Cardiac Rehabilitation    Complete by:  As directed    TAVR   Diagnosis:  Valve Replacement   Valve:  Aortic   Diet - low sodium  heart healthy    Complete by:  As directed    Increase activity slowly    Complete by:  As directed    You were started on a medicine called clopidogrel (Plavix). Your metoprolol dose was changed to half tablet twice a day.   ACTIVITY AND EXERCISE .  Daily activity and exercise are  an important part of your recovery. People recover at different rates depending on their general health and type of valve procedure. .  Most people require six to 10 weeks to feel recovered. .  No lifting, pushing, pulling more than 10 pounds (examples to avoid: groceries, vacuuming, gardening, golfing):  - For one week with a procedure through the groin.  - For six weeks for procedures through the chest wall.  - For three months for procedures through the breast-bone. NOTE: You will typically see one of our providers 7-10 days after your procedure to discuss [place] the above activities.    DRIVING .  Do not drive for four weeks after the date of your procedure. .  If you have been told by your doctor in the past that you may not drive, you must talk with him/her before you begin driving again. .  When you resume driving, you must have someone with you.   DRESSING .  Groin site: you may leave the clear dressing over the site for up to one week or until it falls off.   HYGIENE .  If you had a femoral (leg) procedure, you may take a shower when you return home. After the shower, pat the site dry. Do NOT use powder, oils or lotions in your groin area until the site has completely healed. .  If you had a chest procedure, you may shower when you return home unless specifically instructed not to by your discharging practitioner.  - DO NOT scrub incision; pat dry with a towel  - DO NOT apply any lotions, oils, powders to the incision  - No tub baths / swimming for at least six weeks. .  If you notice any fevers, chills, increased pain, swelling, bleeding or pus, please contact your doctor.   ADDITIONAL INFORMATION .  If you are going to have an upcoming dental procedure, please contact our office as you may require antibiotics ahead of time to prevent infection on your heart valve.      Discharge Medications     Medication List    TAKE these medications   aspirin EC 81 MG  tablet Take 81 mg by mouth daily.   atorvastatin 40 MG tablet Commonly known as:  LIPITOR Take 40 mg by mouth daily.   B-complex with vitamin C tablet Take 1 tablet by mouth 3 (three) times a week.   CHLORELLA PO Take 4 tablets by mouth 2 (two) times daily. MED NAME: CHLORELLA   CHLOROPHYLL PO Take 2 tablets by mouth daily.   clopidogrel 75 MG tablet Commonly known as:  PLAVIX Take 1 tablet (75 mg total) by mouth daily.   finasteride 5 MG tablet Commonly known as:  PROSCAR Take 5 mg by mouth daily.   folic acid Q000111Q MCG tablet Commonly known as:  FOLVITE Take 800 mcg by mouth every other day.   hydrochlorothiazide 12.5 MG capsule Commonly known as:  MICROZIDE TAKE 1 CAPSULE (12.5 MG TOTAL) BY MOUTH DAILY.   isosorbide mononitrate 60 MG 24 hr tablet Commonly known as:  IMDUR Take one and a half (1.5) tablet (90 mg total) by  mouth daily.   levothyroxine 125 MCG tablet Commonly known as:  SYNTHROID, LEVOTHROID Take 125 mcg by mouth daily before breakfast.   Magnesium 200 MG Tabs Take 200 mg by mouth daily.   metoprolol tartrate 25 MG tablet Commonly known as:  LOPRESSOR Take 0.5 tablets (12.5 mg total) by mouth 2 (two) times daily. What changed:  how much to take  when to take this   NAT-RUL PSYLLIUM SEED HUSKS PO Take 610 mg by mouth daily.   nitroGLYCERIN 0.4 MG SL tablet Commonly known as:  NITROSTAT Place 1 tablet (0.4 mg total) under the tongue every 5 (five) minutes as needed for chest pain.   Potassium 99 MG Tabs Take 99 mg by mouth every other day.   SELENIUM PO Take 1 tablet by mouth every other day.   tamsulosin 0.4 MG Caps capsule Commonly known as:  FLOMAX Take 0.4 mg by mouth 2 (two) times daily.   vitamin C 1000 MG tablet Take 1,000 mg by mouth every other day.      Allergies:  Allergies  Allergen Reactions  . No Known Allergies       Outstanding Labs/Studies   2D echo already arranged for 08/03/16   Duration of Discharge  Encounter   Greater than 30 minutes including physician time.  Signed, Charlie Pitter PA-C 06/28/2016, 10:49 AM

## 2016-06-28 NOTE — Progress Notes (Signed)
      [address]       [place] [address]             [phone]        2 Days Post-Op Procedure(s) (LRB): TRANSCATHETER AORTIC VALVE REPLACEMENT, TRANSFEMORAL (Bilateral) TRANSESOPHAGEAL ECHOCARDIOGRAM (TEE) (N/A)  Subjective: Patient without complaints this am.  Objective: Vital signs in last 24 hours: Temp:  [97.5 F (36.4 C)-99.1 F (37.3 C)] 98 F (36.7 C) (09/21 0513) Pulse Rate:  [55-75] 60 (09/21 0513) Cardiac Rhythm: Heart block;Other (Comment) (09/20 2000) Resp:  [16-26] 20 (09/21 0513) BP: (87-158)/(46-73) 131/59 (09/21 0513) SpO2:  [93 %-99 %] 97 % (09/21 0513) Arterial Line BP: (123)/(49) 123/49 (09/20 0800) Weight:  [207 lb 9.6 oz (94.2 kg)] 207 lb 9.6 oz (94.2 kg) (09/21 0513)  Pre op weight 96.6 kg Current Weight  06/28/16 207 lb 9.6 oz (94.2 kg)      Intake/Output from previous day: 09/20 0701 - 09/21 0700 In: 540 [P.O.:540] Out: 1175 [Urine:1175]   Physical Exam:  Cardiovascular: Slightly brady, IRRR Pulmonary: Clear to auscultation bilaterally Abdomen: Soft, non tender, bowel sounds present. Extremities:Trace bilateral lower extremity edema. Wounds: No hematoma right groin and wound is clean and dry.  Lab Results: CBC: Recent Labs  06/27/16 0430 06/28/16 0256  WBC 6.8 7.7  HGB 13.2 13.6  HCT 41.0 42.6  PLT 113* 112*   BMET:  Recent Labs  06/27/16 0430 06/28/16 0256  NA 136 139  K 4.2 4.5  CL 104 103  CO2 26 27  GLUCOSE 102* 101*  BUN 11 14  CREATININE 0.84 1.01  CALCIUM 8.5* 9.0    PT/INR:  Lab Results  Component Value Date   INR 1.37 06/26/2016   INR 1.19 06/22/2016   INR 1.1 05/15/2016   ABG:  INR: Will add last result for INR, ABG once components are confirmed Will add last 4 CBG results once components are confirmed  Assessment/Plan:  1. CV - SB. On Lopressor 12.5 mg bid, HCTZ 12.5 mg daily, Imdur 90 mg daily, Plavix 75 mg daily. 2.  Pulmonary - On room air. 3. Mild  thrombocytopenia-Platelets stable at 112,000. 4. Hopefully, home soon.  ZIMMERMAN,DONIELLE MPA-C 06/28/2016,7:37 AM

## 2016-06-29 NOTE — Telephone Encounter (Signed)
Patient contacted regarding discharge from [facility] on 06/28/2016.  Patient understands to follow up with provider Tim Copa PA on 07/09/16 at 10:30 AM at [facility] office.  08/03/16 Echo at 9:30 AM and Dr Angelena Form at 10:30 AM Patient understands discharge instructions? yes Patient understands medications and regiment? yes Patient understands to bring all medications to this visit? yes  The pt is doing well after TAVR with no complaints at this time.

## 2016-07-04 MED FILL — Dextrose Inj 5%: INTRAVENOUS | Qty: 250 | Status: AC

## 2016-07-04 MED FILL — Insulin Regular (Human) Inj 100 Unit/ML: INTRAMUSCULAR | Qty: 250 | Status: AC

## 2016-07-04 MED FILL — Phenylephrine HCl Inj 10 MG/ML: INTRAMUSCULAR | Qty: 2 | Status: AC

## 2016-07-06 ENCOUNTER — Encounter: Payer: Self-pay | Admitting: Physician Assistant

## 2016-07-07 DIAGNOSIS — R918 Other nonspecific abnormal finding of lung field: Secondary | ICD-10-CM | POA: Insufficient documentation

## 2016-07-07 NOTE — Progress Notes (Signed)
Cardiology Office Note    Date:  07/09/2016  ID:  Tim Campbell, DOB April 08, 1926, MRN [id number] PCP:  Tim Screws, MD  Cardiologist:  Dr. Tamala Campbell; TAVR: Dr. Angelena Campbell   Chief Complaint: f/u TAVR  History of Present Illness:  Tim Campbell is a 82 y.o. male with history of severe aortic stenosis, CAD (s/p CABGx5 in 1993, NSTEMI with occluded VG-diag in 2005, BMS to prox VG-PDA 2012, BMS to SVG-OM 03/2014), HTN, hyperlipidemia, hypothyroidism, large cell non-Hodgkins lymphoma, RBBB, chronic combined CHF, arthritis, diverticulosis, enlarged prostate, RBBB, pulm nodules and small AAA by CT 05/2016 who presents for post-TAVR f/u.  His workup for TAVR started in 2015 due to chest pain and dyspnea but deferred after he had symptomatic improvement after PCI as above. He tripped and fell and broke his wrist in 01/2016 and had to have 2 surgeries. He underwent repeat cath 05/17/2016 showing stable appearance to 2015 with severe native disease, patent LIMA-LAD and SVG-OM, patent SVG-PDA & PL with 50% ISR at ostium of SVG similar to before, chronically occluded SVG-diag, normal right heart filling pressures and mild-mod elevated LV filling pressure, mod-severe AS. 2D echo 06/07/16: EF 45-50% with inf WMA, grade 1 DD, severe AS, mild-mod AI, mean gradient 41, mild MR, mod LAE/RAE, RV systolic function mildly reduced. Given his advanced age and comorbidities he was not felt to be a good candidate for conventional AVR by surgical approach. He underwent TAVR 06/26/16 via Transfemoral Approach with Edwards Sapien 3 THV (size 30mm, model # [id number], serial [id number]). He was started on Plavix. F/u limited echo 06/27/16 showed mod LVH, EF 40-45%, akinesis of basal anteroseptal myocardium, grade 1 DD, AV bioprosthesis present without complication, severely dilated LA. Of note his CT abd/angio 06/06/16 showed 2 pulm nodules of the left lung - noncontrast CT recommended in 3-6 months. An incidental AAA of 3.3x3.0cm was  found as well. Will review further w/u needed upon follow-up  He returns for follow-up today with his daughter. He says he feels well He has not yet "tested the waters" in terms of increasing his activity much, however. He walked to the mailbox for the first time in a while and does report he did not have any CP or SOB. No syncope. No surgical site complications.   Past Medical History:  Diagnosis Date  . AAA (abdominal aortic aneurysm) ([place])    a. 3.3x3.0cm by CT 05/2016.  . Arthritis    osteo knees "   . Chronic combined systolic and diastolic HF (heart failure), NYHA class 3   . Coronary artery disease    a. s/p CABGx5 in 1993. b. NSTEMI with occluded VG-diag in 2005. c. BMS to prox VG-PDA 2012. d. BMS to SVG-OM 03/2014. e. stable cath 05/2016.  . Diverticulosis   . Enlarged prostate    per patient  with mild effect on urine stream  . Heart murmur   . Hyperlipidemia   . Hypertension   . Hypothyroidism   . Insomnia   . Non Hodgkin's lymphoma ([place]) 2007  . Non-ST elevation MI (NSTEMI) ([place])   . Pulmonary nodules    a. by CT 05/2016  . RBBB   . 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 Severe aortic stenosis    a. s/p TAVR 06/2016.  . Vitamin D deficiency     Past Surgical History:  Procedure Laterality Date  . ANKLE SURGERY Right   . CARDIAC CATHETERIZATION N/A 05/17/2016   Procedure: Right/Left Heart Cath and Coronary/Graft  Angiography;  Surgeon: Nelva Bush, MD;  Location: [place] [facility];  Service: Cardiovascular;  Laterality: N/A;  . CATARACT EXTRACTION W/ INTRAOCULAR LENS  IMPLANT, BILATERAL    . COLONOSCOPY    . CORONARY ARTERY BYPASS GRAFT  1993  . CORONARY STENT PLACEMENT  04/21/2014   bare metal SVG   DR Tim Campbell  . HEMORRHOID SURGERY    . HERNIA REPAIR    . I&D EXTREMITY Left 03/09/2016   Procedure: IRRIGATION AND DEBRIDEMENT WRIST;  Surgeon: Milly Jakob, MD;  Location: [place];  Service: Orthopedics;  Laterality: Left;  . KNEE ARTHROSCOPY  Right 1970'S   . LEFT AND RIGHT HEART CATHETERIZATION WITH CORONARY/GRAFT ANGIOGRAM N/A 04/05/2014   Procedure: LEFT AND RIGHT HEART CATHETERIZATION WITH Tim Campbell;  Surgeon: Tim Blanks, MD;  Location: [facility] C[facility] LAB;  Service: Cardiovascular;  Laterality: N/A;  . LEFT HEART CATHETERIZATION WITH CORONARY/GRAFT ANGIOGRAM N/A 01/02/2013   Procedure: LEFT HEART CATHETERIZATION WITH Tim Campbell;  Surgeon: [name], MD;  Location: [facility] CATH LAB;  Service: Cardiovascular;  Laterality: N/A;  . OPEN REDUCTION INTERNAL FIXATION (ORIF) DISTAL RADIAL FRACTURE Left 02/24/2016   Procedure: OPEN TREATMENT OF LEFT DISTAL RADIUS FRACTURE;  Surgeon: Milly Jakob, MD;  Location: [place];  Service: Orthopedics;  Laterality: Left;  . PERCUTANEOUS CORONARY STENT INTERVENTION (PCI-S) N/A 04/21/2014   Procedure: PERCUTANEOUS CORONARY STENT INTERVENTION (PCI-S);  Surgeon: [name], MD;  Location: [facility] CATH LAB;  Service: Cardiovascular;  Laterality: N/A;  . Right inguinal hernia repair    . TEE WITHOUT CARDIOVERSION N/A 06/26/2016   Procedure: TRANSESOPHAGEAL ECHOCARDIOGRAM (TEE);  Surgeon: Tim Blanks, MD;  Location: [place];  Service: Open Heart Surgery;  Laterality: N/A;  . TRANSCATHETER AORTIC VALVE REPLACEMENT, TRANSFEMORAL Bilateral 06/26/2016   Procedure: TRANSCATHETER AORTIC VALVE REPLACEMENT, TRANSFEMORAL;  Surgeon: Tim Blanks, MD;  Location: [facility];  Service: Open Heart Surgery;  Laterality: Bilateral;  . WRIST SURGERY Left 2017   fx-plate    Current Medications: Current Outpatient Prescriptions  Medication Sig Dispense Refill  . Ascorbic Acid (VITAMIN C) 1000 MG tablet Take 1,000 mg by mouth every other day.    Marland Kitchen aspirin EC 81 MG tablet Take 81 mg by mouth daily.    Marland Kitchen atorvastatin (LIPITOR) 40 MG tablet Take 40 mg by mouth daily.    . B Complex-C (B-COMPLEX WITH VITAMIN C) tablet Take 1 tablet by mouth 3 (three) times a week.    .  CHLOROPHYLL PO Take 2 tablets by mouth once a week.     . clopidogrel (PLAVIX) 75 MG tablet Take 1 tablet (75 mg total) by mouth daily. 30 tablet 6  . finasteride (PROSCAR) 5 MG tablet Take 5 mg by mouth daily.  11  . folic acid (FOLVITE) Q000111Q MCG tablet Take 800 mcg by mouth every other day.    . hydrochlorothiazide (MICROZIDE) 12.5 MG capsule TAKE 1 CAPSULE (12.5 MG TOTAL) BY MOUTH DAILY. 30 capsule 6  . isosorbide mononitrate (IMDUR) 60 MG 24 hr tablet Take one and a half (1.5) tablet (90 mg total) by mouth daily.    Marland Kitchen levothyroxine (SYNTHROID, LEVOTHROID) 125 MCG tablet Take 125 mcg by mouth daily before breakfast.   3  . Magnesium 200 MG TABS Take 200 mg by mouth daily.    . metoprolol tartrate (LOPRESSOR) 25 MG tablet Take 0.5 tablets (12.5 mg total) by mouth 2 (two) times daily. 60 tablet 3  . Multiple Vitamins-Iron (CHLORELLA PO) Take 4 tablets by  mouth 2 (two) times daily. MED NAME: CHLORELLA    . NAT-RUL PSYLLIUM SEED HUSKS PO Take 2 tablets by mouth once a week.     . nitroGLYCERIN (NITROSTAT) 0.4 MG SL tablet Place 1 tablet (0.4 mg total) under the tongue every 5 (five) minutes as needed for chest pain. 25 tablet 11  . SELENIUM PO Take 1 tablet by mouth every other day.     . tamsulosin (FLOMAX) 0.4 MG CAPS capsule Take 0.4 mg by mouth 2 (two) times daily.  11   No current facility-administered medications for this visit.      Allergies:   No known allergies   Social History   Social History  . Marital status: Married    Spouse name: N/A  . Number of children: 3  . Years of education: N/A   Occupational History  . 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 Smoking status: Former Smoker    Packs/day: 0.50    Years: 15.00    Types: Cigarettes    Quit date: 06/22/1953  . Smokeless tobacco: Never Used     Comment: quit smoking  in 1962   . Alcohol use No  . Drug use: No  . Sexual activity: Not Asked   Other Topics Concern  . None    Social History Narrative  . None     Family History:  The patient's family history includes Bone cancer in his brother; CAD in his brother; Colon cancer in his father; Dementia in his sister; Parkinson's disease in his brother; Stroke in his mother, sister, and sister.   ROS:   Please see the history of present illness.  All other systems are reviewed and otherwise negative.    PHYSICAL EXAM:   VS:  BP (!) 102/56   Pulse (!) 57   Ht 5\' 10"  (1.778 m)   Wt 211 lb (95.7 kg)   SpO2 98%   BMI 30.28 kg/m   BMI: Body mass index is 30.28 kg/m. GEN: Well nourished, well developed obese WM in no acute distress  HEENT: normocephalic, atraumatic Neck: no JVD, carotid bruits, or masses Cardiac: RRR; no murmurs, rubs, or gallops, no edema  Respiratory:  clear to auscultation bilaterally, normal work of breathing GI: soft, nontender, nondistended, + BS MS: no deformity or atrophy  Skin: warm and dry, no rash, right groin cath site without hematoma or bruit. Fading ecchymosis Neuro:  Alert and Oriented x 3, Strength and sensation are intact, follows commands Psych: euthymic mood, full affect  Wt Readings from Last 3 Encounters:  07/09/16 211 lb (95.7 kg)  06/28/16 207 lb 9.6 oz (94.2 kg)  06/22/16 213 lb 6.4 oz (96.8 kg)      Studies/Labs Reviewed:   EKG:  EKG was ordered today and personally reviewed by me and demonstrates NSR versus ectopic atrial rhythm 57bpm, nonspecific ST-T change   Recent Labs: 06/22/2016: ALT 13 06/27/2016: Magnesium 2.0 06/28/2016: BUN 14; Creatinine, Ser 1.01; Hemoglobin 13.6; Platelets 112; Potassium 4.5; Sodium 139    Additional studies/ records that were reviewed today include: Summarized above.    ASSESSMENT & PLAN:   1. Severe AS s/p TAVR - doing well Continue ASA, Plavix. F/u with echo later this month to reassess. Discussed cardiac rehab with patient but he is not interested. He wants to increase activity on his own. We discussed gradual  increase of activity with focus on improved endurance. Warning sx reviewed. He is eager to drive again "  since I feel just fine." Per d/c recommendations, reviewed no driving for 4 weeks after procedure. 2. Chronic combined CHF - appears stable. Continue current med regimen. 3. Essential HTN - controlled. Follow. His daughter asked [place] to clarify Imdur rx - last one on file at pharmacy was 120mg  daily. Will keep at 90mg  daily given BP on softer side. 4. AAA - further surveillance per primary cardiologist. 5. Pulm nodules - reviewed with patient and his daughter. They will discuss further monitoring with his PCP this afternoon in terms of repeat CT scan timing. 6. CAD in native artery - currently asymptomatic.  Disposition: F/u with Dr. Angelena Campbell at the end of this month as scheduled.   Medication Adjustments/Labs and Tests Ordered: Current medicines are reviewed at length with the patient today.  Concerns regarding medicines are outlined above. Medication changes, Labs and Tests ordered today are summarized above and listed in the Patient Instructions accessible in Encounters.   Raechel Ache PA-C  07/09/2016 11:23 AM    [address], [place], [place]  [address] Phone: [phone]; Fax: [phone]

## 2016-07-09 ENCOUNTER — Ambulatory Visit (INDEPENDENT_AMBULATORY_CARE_PROVIDER_SITE_OTHER): Payer: Medicare Other | Admitting: Physician Assistant

## 2016-07-09 ENCOUNTER — Encounter: Payer: Self-pay | Admitting: Physician Assistant

## 2016-07-09 VITALS — BP 102/56 | HR 57 | Ht 70.0 in | Wt 211.0 lb

## 2016-07-09 DIAGNOSIS — I714 Abdominal aortic aneurysm, without rupture, unspecified: Secondary | ICD-10-CM

## 2016-07-09 DIAGNOSIS — I5042 Chronic combined systolic (congestive) and diastolic (congestive) heart failure: Secondary | ICD-10-CM

## 2016-07-09 DIAGNOSIS — R972 Elevated prostate specific antigen [PSA]: Secondary | ICD-10-CM | POA: Diagnosis not present

## 2016-07-09 DIAGNOSIS — I25119 Atherosclerotic heart disease of native coronary artery with unspecified angina pectoris: Secondary | ICD-10-CM | POA: Diagnosis not present

## 2016-07-09 DIAGNOSIS — I350 Nonrheumatic aortic (valve) stenosis: Secondary | ICD-10-CM

## 2016-07-09 DIAGNOSIS — E782 Mixed hyperlipidemia: Secondary | ICD-10-CM | POA: Diagnosis not present

## 2016-07-09 DIAGNOSIS — I10 Essential (primary) hypertension: Secondary | ICD-10-CM | POA: Diagnosis not present

## 2016-07-09 DIAGNOSIS — I951 Orthostatic hypotension: Secondary | ICD-10-CM | POA: Diagnosis not present

## 2016-07-09 DIAGNOSIS — R351 Nocturia: Secondary | ICD-10-CM | POA: Diagnosis not present

## 2016-07-09 DIAGNOSIS — Z952 Presence of prosthetic heart valve: Secondary | ICD-10-CM | POA: Diagnosis not present

## 2016-07-09 DIAGNOSIS — D81818 Other biotin-dependent carboxylase deficiency: Secondary | ICD-10-CM | POA: Diagnosis not present

## 2016-07-09 DIAGNOSIS — R918 Other nonspecific abnormal finding of lung field: Secondary | ICD-10-CM

## 2016-07-09 DIAGNOSIS — G4700 Insomnia, unspecified: Secondary | ICD-10-CM | POA: Diagnosis not present

## 2016-07-09 DIAGNOSIS — E559 Vitamin D deficiency, unspecified: Secondary | ICD-10-CM | POA: Diagnosis not present

## 2016-07-09 DIAGNOSIS — I2510 Atherosclerotic heart disease of native coronary artery without angina pectoris: Secondary | ICD-10-CM | POA: Diagnosis not present

## 2016-07-09 DIAGNOSIS — Z1389 Encounter for screening for other disorder: Secondary | ICD-10-CM | POA: Diagnosis not present

## 2016-07-09 DIAGNOSIS — Z79899 Other long term (current) drug therapy: Secondary | ICD-10-CM | POA: Diagnosis not present

## 2016-07-09 DIAGNOSIS — Z0001 Encounter for general adult medical examination with abnormal findings: Secondary | ICD-10-CM | POA: Diagnosis not present

## 2016-07-09 DIAGNOSIS — E039 Hypothyroidism, unspecified: Secondary | ICD-10-CM | POA: Diagnosis not present

## 2016-07-09 MED ORDER — ISOSORBIDE MONONITRATE ER 60 MG PO TB24: 90.0000 mg | ORAL_TABLET | Freq: Every day | ORAL | 3 refills | Status: DC

## 2016-07-09 NOTE — Patient Instructions (Signed)
Medication Instructions:  None  Labwork: None  Testing/Procedures: None  Follow-Up: Keep follow up appointment as scheduled with Dr. Angelena Form.  Follow up with your Primary Care Physician in regards to your pulmonary nodules.  Any Other Special Instructions Will Be Listed Below (If Applicable).     If you need a refill on your cardiac medications before your next appointment, please call your pharmacy.

## 2016-07-10 DIAGNOSIS — N401 Enlarged prostate with lower urinary tract symptoms: Secondary | ICD-10-CM | POA: Diagnosis not present

## 2016-07-10 DIAGNOSIS — R350 Frequency of micturition: Secondary | ICD-10-CM | POA: Diagnosis not present

## 2016-07-10 DIAGNOSIS — N3281 Overactive bladder: Secondary | ICD-10-CM | POA: Diagnosis not present

## 2016-07-11 NOTE — Addendum Note (Signed)
Addended by: Briant Cedar on: 07/11/2016 11:19 AM   Modules accepted: Orders

## 2016-07-12 ENCOUNTER — Other Ambulatory Visit: Payer: Self-pay | Admitting: Interventional Cardiology

## 2016-07-16 DIAGNOSIS — S52532D Colles' fracture of left radius, subsequent encounter for closed fracture with routine healing: Secondary | ICD-10-CM | POA: Diagnosis not present

## 2016-07-26 ENCOUNTER — Encounter: Payer: Self-pay | Admitting: Cardiovascular Disease

## 2016-08-03 ENCOUNTER — Other Ambulatory Visit: Payer: Self-pay

## 2016-08-03 ENCOUNTER — Encounter: Payer: Self-pay | Admitting: Cardiovascular Disease

## 2016-08-03 ENCOUNTER — Ambulatory Visit (HOSPITAL_COMMUNITY): Payer: Medicare Other | Attending: Cardiovascular Disease

## 2016-08-03 ENCOUNTER — Ambulatory Visit (INDEPENDENT_AMBULATORY_CARE_PROVIDER_SITE_OTHER): Payer: Medicare Other | Admitting: Cardiovascular Disease

## 2016-08-03 VITALS — BP 116/56 | HR 48 | Ht 70.0 in | Wt 217.0 lb

## 2016-08-03 DIAGNOSIS — I350 Nonrheumatic aortic (valve) stenosis: Secondary | ICD-10-CM | POA: Diagnosis not present

## 2016-08-03 DIAGNOSIS — I2510 Atherosclerotic heart disease of native coronary artery without angina pectoris: Secondary | ICD-10-CM

## 2016-08-03 DIAGNOSIS — Z952 Presence of prosthetic heart valve: Secondary | ICD-10-CM

## 2016-08-03 NOTE — Progress Notes (Signed)
[place]  Chief Complaint  Patient presents with  . Follow-up    pt c/o no problems, also stated that a while back he had one dizzy spell      History of Present Illness: 82 yo male with history of CAD s/p CABG, HTN, Hyperlipidemia, hypothyroidism, large cell non-Hodgkins lymphoma and severe aortic valve stenosis here today for one month TAVR follow up. His TAVR workup actually began in June 2015. Cardiac cath June 2015 and a stent was placed in a vein graft. He felt better and delayed the TAVR. In August 2017 he began to have more dyspnea and chest pain. Cardiac cath August 2017 with stable. CAD. TAVR was performed on 06/26/16 with a 29 mm Sapient 3 valve. His hospital course post procedure was uneventful. Of note, 2 pulmonary nodules noted on pre-op CT scan.   He is here today for follow up. He is feeling well. No dyspnea. No chest pain. No dizziness. Occasional leg weakness.   Primary Care Physician: [name], MD   Past Medical History:  Diagnosis Date  . AAA (abdominal aortic aneurysm) ([place])    a. 3.3x3.0cm by CT 05/2016.  . Arthritis    osteo knees "   . Chronic combined systolic and diastolic HF (heart failure), NYHA class 3   . Coronary artery disease    a. s/p CABGx5 in 1993. b. NSTEMI with occluded VG-diag in 2005. c. BMS to prox VG-PDA 2012. d. BMS to SVG-OM 03/2014. e. stable cath 05/2016.  . Diverticulosis   . Enlarged prostate    per patient  with mild effect on urine stream  . Heart murmur   . Hyperlipidemia   . Hypertension   . Hypothyroidism   . Insomnia   . Non Hodgkin's lymphoma ([place]) 2007  . Non-ST elevation MI (NSTEMI) ([place])   . Pulmonary nodules    a. by CT 05/2016  . RBBB   . 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 Severe aortic stenosis    a. s/p TAVR 06/2016.  . Vitamin D deficiency     Past Surgical  History:  Procedure Laterality Date  . ANKLE SURGERY Right   . CARDIAC CATHETERIZATION N/A 05/17/2016   Procedure: Right/Left Heart Cath and Coronary/Graft Angiography;  Surgeon: Nelva Bush, MD;  Location: [place] [facility];  Service: Cardiovascular;  Laterality: N/A;  . CATARACT EXTRACTION W/ INTRAOCULAR LENS  IMPLANT, BILATERAL    . COLONOSCOPY    . CORONARY ARTERY BYPASS GRAFT  1993  . CORONARY STENT PLACEMENT  04/21/2014   bare metal SVG   DR Tamala Julian  . HEMORRHOID SURGERY    . HERNIA REPAIR    . I&D EXTREMITY Left 03/09/2016   Procedure: IRRIGATION AND DEBRIDEMENT WRIST;  Surgeon: Milly Jakob, MD;  Location: [place];  Service: Orthopedics;  Laterality: Left;  . KNEE ARTHROSCOPY Right 1970'S   . LEFT AND RIGHT HEART CATHETERIZATION WITH CORONARY/GRAFT ANGIOGRAM N/A 04/05/2014   Procedure: LEFT AND RIGHT  HEART CATHETERIZATION WITH [name];  Surgeon: Burnell Blanks, MD;  Location: [facility] CATH LAB;  Service: Cardiovascular;  Laterality: N/A;  . LEFT HEART CATHETERIZATION WITH CORONARY/GRAFT ANGIOGRAM N/A 01/02/2013   Procedure: LEFT HEART CATHETERIZATION WITH [name];  Surgeon: [name], MD;  Location: [facility] CATH LAB;  Service: Cardiovascular;  Laterality: N/A;  . OPEN REDUCTION INTERNAL FIXATION (ORIF) DISTAL RADIAL FRACTURE Left 02/24/2016   Procedure: OPEN TREATMENT OF LEFT DISTAL RADIUS FRACTURE;  Surgeon: Milly Jakob, MD;  Location: [place];  Service: Orthopedics;  Laterality: Left;  . PERCUTANEOUS CORONARY STENT INTERVENTION (PCI-S) N/A 04/21/2014   Procedure: PERCUTANEOUS CORONARY STENT INTERVENTION (PCI-S);  Surgeon: [name], MD;  Location: [facility] CATH LAB;  Service: Cardiovascular;  Laterality: N/A;  . Right inguinal hernia repair    . TEE WITHOUT CARDIOVERSION N/A 06/26/2016   Procedure: TRANSESOPHAGEAL ECHOCARDIOGRAM (TEE);  Surgeon: Burnell Blanks, MD;  Location: [place];  Service: Open Heart Surgery;  Laterality: N/A;  .  TRANSCATHETER AORTIC VALVE REPLACEMENT, TRANSFEMORAL Bilateral 06/26/2016   Procedure: TRANSCATHETER AORTIC VALVE REPLACEMENT, TRANSFEMORAL;  Surgeon: Burnell Blanks, MD;  Location: [place];  Service: Open Heart Surgery;  Laterality: Bilateral;  . WRIST SURGERY Left 2017   fx-plate    Current Outpatient Prescriptions  Medication Sig Dispense Refill  . Ascorbic Acid (VITAMIN C) 1000 MG tablet Take 1,000 mg by mouth every other day.    Marland Kitchen aspirin EC 81 MG tablet Take 81 mg by mouth daily.    Marland Kitchen atorvastatin (LIPITOR) 40 MG tablet Take 40 mg by mouth daily.    . B Complex-C (B-COMPLEX WITH VITAMIN C) tablet Take 1 tablet by mouth 3 (three) times a week.    . CHLOROPHYLL PO Take 2 tablets by mouth once a week.     . clopidogrel (PLAVIX) 75 MG tablet Take 1 tablet (75 mg total) by mouth daily. 30 tablet 6  . finasteride (PROSCAR) 5 MG tablet Take 5 mg by mouth daily.  11  . folic acid (FOLVITE) Q000111Q MCG tablet Take 800 mcg by mouth every other day.    . hydrochlorothiazide (MICROZIDE) 12.5 MG capsule TAKE 1 CAPSULE (12.5 MG TOTAL) BY MOUTH DAILY. 30 capsule 11  . isosorbide mononitrate (IMDUR) 60 MG 24 hr tablet Take 1.5 tablets (90 mg total) by mouth daily. Take one and a half (1.5) tablet (90 mg total) by mouth daily. 135 tablet 3  . levothyroxine (SYNTHROID, LEVOTHROID) 125 MCG tablet Take 125 mcg by mouth daily before breakfast.   3  . Magnesium 200 MG TABS Take 200 mg by mouth daily.    . metoprolol tartrate (LOPRESSOR) 25 MG tablet Take 0.5 tablets (12.5 mg total) by mouth 2 (two) times daily. 60 tablet 3  . Multiple Vitamins-Iron (CHLORELLA PO) Take 4 tablets by mouth 2 (two) times daily. MED NAME: CHLORELLA    . NAT-RUL PSYLLIUM SEED HUSKS PO Take 2 tablets by mouth once a week.     . nitroGLYCERIN (NITROSTAT) 0.4 MG SL tablet Place 1 tablet (0.4 mg total) under the tongue every 5 (five) minutes as needed for chest pain. 25 tablet 11  . SELENIUM PO Take 1 tablet by mouth every other day.      . tamsulosin (FLOMAX) 0.4 MG CAPS capsule Take 0.4 mg by mouth 2 (two) times daily.  11   No current facility-administered medications for this visit.     Allergies  Allergen Reactions  . No Known Allergies  Social History   Social History  . Marital status: Married    Spouse name: N/A  . Number of children: 3  . Years of education: N/A   Occupational History  . 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 Smoking status: Former Smoker    Packs/day: 0.50    Years: 15.00    Types: Cigarettes    Quit date: 06/22/1953  . Smokeless tobacco: Never Used     Comment: quit smoking  in 1962   . Alcohol use No  . Drug use: No  . Sexual activity: Not on file   Other Topics Concern  . Not on file   Social History Narrative  . No narrative on file    Family History  Problem Relation Age of Onset  . Colon cancer Father   . Stroke Mother   . CAD Brother   . Stroke Sister   . Bone cancer Brother   . Parkinson's disease Brother   . Dementia Sister   . Stroke Sister     Review of Systems:  As stated in the HPI and otherwise negative.   BP (!) 116/56   Pulse (!) 48   Ht 5\' 10"  (1.778 m)   Wt 217 lb (98.4 kg)   SpO2 95%   BMI 31.14 kg/m   Physical Examination: General: Well developed, well nourished, NAD  HEENT: OP clear, mucus membranes moist  SKIN: warm, dry. No rashes. Neuro: No focal deficits  Musculoskeletal: Muscle strength 5/5 all ext  Psychiatric: Mood and affect normal  Neck: No JVD, no carotid bruits, no thyromegaly, no lymphadenopathy.  Lungs:Clear bilaterally, no wheezes, rhonci, crackles Cardiovascular: Regular rate and rhythm. Harsh systolic murmur. No gallops or rubs. Abdomen:Soft. Bowel sounds present. Non-tender.  Extremities: Trace  bilateral lower extremity edema. Pulses are 1 + in the bilateral DP/PT.  Echo 08/03/16: Left ventricle: Systolic function was mildly to moderately   reduced. The estimated  ejection fraction was in the range of 40%   to 45%. Akinesis and scarring of the basal-midinferolateral,   inferior, and inferoseptal myocardium; consistent with infarction   in the distribution of the right coronary artery. Severe   hypokinesis of the basal anteroseptal myocardium; consistent with   ischemia in the distribution of the left anterior descending   coronary artery, but with a patent distal bypass. Features are   consistent with a pseudonormal left ventricular filling pattern,   with concomitant abnormal relaxation and increased filling   pressure (grade 2 diastolic dysfunction). - Aortic valve:  A stent valve bioprosthesis (TAVR) was present and functioning normally.  Doppler:  There was trivial regurgitation.  VTI ratio of LVOT to aortic valve: 0.28. Valve area (VTI): 1.16 cm^2. Indexed valve area (VTI): 0.53 cm^2/m^2. Peak velocity ratio of LVOT to aortic valve: 0.26. Valve area (Vmax): 1.09 cm^2. Indexed valve area (Vmax): 0.5 cm^2/m^2. Mean velocity ratio of LVOT to aortic valve: 0.28. Valve area (Vmean): 1.18 cm^2. Indexed valve area (Vmean): 0.54 cm^2/m^2.    Mean gradient (S): 25 mm Hg. Peak gradient (S): 42 mm Hg. - Mitral valve: Calcified annulus. Mildly thickened leaflets .   There was mild to moderate regurgitation directed centrally. - Left atrium: The atrium was severely dilated. - Right ventricle: The cavity size was mildly dilated. - Right atrium: The atrium was mildly dilated.  EKG:  EKG is not ordered today. The ekg ordered today demonstrates   Recent Labs: 06/22/2016: ALT 13 06/27/2016: Magnesium 2.0 06/28/2016: BUN  14; Creatinine, Ser 1.01; Hemoglobin 13.6; Platelets 112; Potassium 4.5; Sodium 139   Lipid Panel No results found for: CHOL, TRIG, HDL, CHOLHDL, VLDL, LDLCALC, LDLDIRECT   Wt Readings from Last 3 Encounters:  08/03/16 217 lb (98.4 kg)  07/09/16 211 lb (95.7 kg)  06/28/16 207 lb 9.6 oz (94.2 kg)     Other studies Reviewed: Additional  studies/ records that were reviewed today include: . Review of the above records demonstrates:   Assessment and Plan:   1. Severe aortic valve stenosis: He is now one month post TAVR. He is doing well. He is NYHA class 1. Echo today shows low normal LV function, unchanged. The bioprosthetic aortic valve seems to be functioning well. The gradient across the valve is slightly higher than the first post op echo but he is doing well. Will plan repeat echo in one year when I see him back. Will continue ASA, Plavix and beta blocker. He will see Dr. Tamala Julian in 6 months and me in one year.   2. Pulmonary nodule: Follow up in primary care   Current medicines are reviewed at length with the patient today.  The patient does not have concerns regarding medicines.  The following changes have been made:  no change  Labs/ tests ordered today include:   No orders of the defined types were placed in this encounter.    Disposition:   FU with me in one year with echo. F/U Dr. Tamala Julian in 6 months.    Signed, Lauree Chandler, MD 08/03/2016 10:54 AM    [facility] [address], [place], [place]  [address] Phone: [phone]; Fax: [phone]

## 2016-08-03 NOTE — Patient Instructions (Signed)
Medication Instructions:  Your physician recommends that you continue on your current medications as directed. Please refer to the Current Medication list given to you today.   Labwork: none  Testing/Procedures:  Your physician has requested that you have an echocardiogram. Echocardiography is a painless test that uses sound waves to create images of your heart. It provides your doctor with information about the size and shape of your heart and how well your heart's chambers and valves are working. This procedure takes approximately one hour. There are no restrictions for this procedure. To be done in one year on day of appointment with Dr. Angelena Form    Follow-Up: Your physician wants you to follow-up in: 6 months with Dr. Tamala Julian and 12 months with Dr. Angelena Form.  You will receive a reminder letter in the mail two months in advance. If you don't receive a letter, please call our office to schedule the follow-up appointment.    Any Other Special Instructions Will Be Listed Below (If Applicable).     If you need a refill on your cardiac medications before your next appointment, please call your pharmacy.

## 2016-08-06 DIAGNOSIS — N401 Enlarged prostate with lower urinary tract symptoms: Secondary | ICD-10-CM | POA: Diagnosis not present

## 2016-08-06 DIAGNOSIS — N3941 Urge incontinence: Secondary | ICD-10-CM | POA: Diagnosis not present

## 2016-09-06 ENCOUNTER — Encounter (HOSPITAL_BASED_OUTPATIENT_CLINIC_OR_DEPARTMENT_OTHER): Payer: Self-pay | Admitting: *Deleted

## 2016-09-06 ENCOUNTER — Emergency Department (HOSPITAL_BASED_OUTPATIENT_CLINIC_OR_DEPARTMENT_OTHER)
Admission: EM | Admit: 2016-09-06 | Discharge: 2016-09-06 | Disposition: A | Discharge status: Home / Self Care | Payer: Medicare Other | Attending: Physician Assistant | Admitting: Physician Assistant

## 2016-09-06 DIAGNOSIS — E039 Hypothyroidism, unspecified: Secondary | ICD-10-CM | POA: Diagnosis not present

## 2016-09-06 DIAGNOSIS — R339 Retention of urine, unspecified: Secondary | ICD-10-CM | POA: Diagnosis not present

## 2016-09-06 DIAGNOSIS — Z79899 Other long term (current) drug therapy: Secondary | ICD-10-CM | POA: Insufficient documentation

## 2016-09-06 DIAGNOSIS — I5042 Chronic combined systolic (congestive) and diastolic (congestive) heart failure: Secondary | ICD-10-CM | POA: Diagnosis not present

## 2016-09-06 DIAGNOSIS — Z87891 Personal history of nicotine dependence: Secondary | ICD-10-CM | POA: Insufficient documentation

## 2016-09-06 DIAGNOSIS — I2510 Atherosclerotic heart disease of native coronary artery without angina pectoris: Secondary | ICD-10-CM | POA: Insufficient documentation

## 2016-09-06 DIAGNOSIS — I110 Hypertensive heart disease with heart failure: Secondary | ICD-10-CM | POA: Insufficient documentation

## 2016-09-06 DIAGNOSIS — Z7982 Long term (current) use of aspirin: Secondary | ICD-10-CM | POA: Diagnosis not present

## 2016-09-06 DIAGNOSIS — R300 Dysuria: Secondary | ICD-10-CM | POA: Diagnosis present

## 2016-09-06 LAB — URINALYSIS, ROUTINE W REFLEX MICROSCOPIC
Bilirubin Urine: NEGATIVE
GLUCOSE, UA: NEGATIVE mg/dL
HGB URINE DIPSTICK: NEGATIVE
Ketones, ur: NEGATIVE mg/dL
Leukocytes, UA: NEGATIVE
Nitrite: NEGATIVE
Protein, ur: NEGATIVE mg/dL
SPECIFIC GRAVITY, URINE: 1.008 (ref 1.005–1.030)
pH: 6.5 (ref 5.0–8.0)

## 2016-09-06 NOTE — ED Triage Notes (Signed)
Pt c/o unable to void  X 4 hrs

## 2016-09-06 NOTE — Discharge Instructions (Signed)
Follow-up with urology in send 7-10 days. Please return with fever or other concerning symptoms.  His take medications as prescribed.

## 2016-09-06 NOTE — ED Provider Notes (Signed)
[facility] DEPT [facility] Provider Note   CSN: [id number] Arrival date & time: 09/06/16  0708   History   Chief Complaint Chief Complaint  Patient presents with  . Urinary Retention   HPI   Tim Campbell is a 82 y.o. male with PMH of CAD s/p CABG, HTN, HLD, hypothyroidism, large cell non-Hodgkins lymphoma, severe aortic valve stenosis, and BPH coming in with complaints of difficulty urinating. Patient states that for the last couple days his urine stream has not been very good. It has been hard for him to initiate urination and when he does only a couple drops will come out. He has had urinary retention before and had to have a Foley placed and he feels like this is similar. Patient normally takes Tamsulosin and finasteride. He follows with urologist last time he saw his urologist was 30 days ago. At that time his urologist told him that he did not take his Flomax and finasteride then he would end up back in the emergency department. Patient has not taken his finasteride and last 30 days. He is best to take his Flomax twice daily but he has only been taking in the morning. Denies any fevers, chills, dysuria, chest pain, coughing, breath, edema, nausea, vomiting, abdominal pain, diarrhea, constipation.   Past Medical History:  Diagnosis Date  . AAA (abdominal aortic aneurysm) ([place])    a. 3.3x3.0cm by CT 05/2016.  . Arthritis    osteo knees "   . Chronic combined systolic and diastolic HF (heart failure), NYHA class 3   . Coronary artery disease    a. s/p CABGx5 in 1993. b. NSTEMI with occluded VG-diag in 2005. c. BMS to prox VG-PDA 2012. d. BMS to SVG-OM 03/2014. e. stable cath 05/2016.  . Diverticulosis   . Enlarged prostate    per patient  with mild effect on urine stream  . Heart murmur   . Hyperlipidemia   . Hypertension   . Hypothyroidism   . Insomnia   . Non Hodgkin's lymphoma ([place]) 2007  . Non-ST elevation MI (NSTEMI) ([place])   . Pulmonary nodules    a. by CT 05/2016  . RBBB    . 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 Severe aortic stenosis    a. s/p TAVR 06/2016.  . Vitamin D deficiency     Patient Active Problem List   Diagnosis Date Noted  . Pulmonary nodules 07/07/2016  . S/P TAVR (transcatheter aortic valve replacement) 06/28/2016  . Hypotension 06/28/2016  . Thrombocytopenia ([place]) 06/28/2016  . Hypothyroidism 06/28/2016  . RBBB 06/28/2016  . AAA (abdominal aortic aneurysm) ([place])   . Severe aortic valve stenosis 06/26/2016  . Postoperative wound infection 03/11/2016  . Preoperative cardiovascular examination 02/20/2016  . Hyperlipidemia 06/17/2014  . Chronic combined systolic and diastolic HF (heart failure), NYHA class 3 ([place]) 09/09/2013  . CAD (coronary artery disease) of artery bypass graft 01/03/2013  . HTN (hypertension) 01/03/2013  . Non-ST elevation MI (NSTEMI) ([place]) 01/02/2013    Class: Acute    Past Surgical History:  Procedure Laterality Date  . ANKLE SURGERY Right   . CARDIAC CATHETERIZATION N/A 05/17/2016   Procedure: Right/Left Heart Cath and Coronary/Graft Angiography;  Surgeon: Nelva Bush, MD;  Location: [place] [facility];  Service: Cardiovascular;  Laterality: N/A;  . CATARACT EXTRACTION W/ INTRAOCULAR LENS  IMPLANT, BILATERAL    . COLONOSCOPY    . CORONARY ARTERY BYPASS GRAFT  1993  . CORONARY STENT PLACEMENT  04/21/2014   bare  metal SVG   DR Tamala Julian  . HEMORRHOID SURGERY    . HERNIA REPAIR    . I&D EXTREMITY Left 03/09/2016   Procedure: IRRIGATION AND DEBRIDEMENT WRIST;  Surgeon: Milly Jakob, MD;  Location: [place];  Service: Orthopedics;  Laterality: Left;  . KNEE ARTHROSCOPY Right 1970'S   . LEFT AND RIGHT HEART CATHETERIZATION WITH CORONARY/GRAFT ANGIOGRAM N/A 04/05/2014   Procedure: LEFT AND RIGHT HEART CATHETERIZATION WITH [name];  Surgeon: Burnell Blanks, MD;  Location: [facility] C[facility] LAB;  Service: Cardiovascular;  Laterality: N/A;  . LEFT HEART CATHETERIZATION WITH  CORONARY/GRAFT ANGIOGRAM N/A 01/02/2013   Procedure: LEFT HEART CATHETERIZATION WITH [name];  Surgeon: [name], MD;  Location: [facility] CATH LAB;  Service: Cardiovascular;  Laterality: N/A;  . OPEN REDUCTION INTERNAL FIXATION (ORIF) DISTAL RADIAL FRACTURE Left 02/24/2016   Procedure: OPEN TREATMENT OF LEFT DISTAL RADIUS FRACTURE;  Surgeon: Milly Jakob, MD;  Location: [place];  Service: Orthopedics;  Laterality: Left;  . PERCUTANEOUS CORONARY STENT INTERVENTION (PCI-S) N/A 04/21/2014   Procedure: PERCUTANEOUS CORONARY STENT INTERVENTION (PCI-S);  Surgeon: [name], MD;  Location: [facility] CATH LAB;  Service: Cardiovascular;  Laterality: N/A;  . Right inguinal hernia repair    . TEE WITHOUT CARDIOVERSION N/A 06/26/2016   Procedure: TRANSESOPHAGEAL ECHOCARDIOGRAM (TEE);  Surgeon: Burnell Blanks, MD;  Location: [place];  Service: Open Heart Surgery;  Laterality: N/A;  . TRANSCATHETER AORTIC VALVE REPLACEMENT, TRANSFEMORAL Bilateral 06/26/2016   Procedure: TRANSCATHETER AORTIC VALVE REPLACEMENT, TRANSFEMORAL;  Surgeon: Burnell Blanks, MD;  Location: [place];  Service: Open Heart Surgery;  Laterality: Bilateral;  . WRIST SURGERY Left 2017   fx-plate     Home Medications    Prior to Admission medications   Medication Sig Start Date End Date Taking? Authorizing Provider  Ascorbic Acid (VITAMIN C) 1000 MG tablet Take 1,000 mg by mouth every other day.    Historical Provider, MD  aspirin EC 81 MG tablet Take 81 mg by mouth daily.    Historical Provider, MD  atorvastatin (LIPITOR) 40 MG tablet Take 40 mg by mouth daily.    Historical Provider, MD  B Complex-C (B-COMPLEX WITH VITAMIN C) tablet Take 1 tablet by mouth 3 (three) times a week.    Historical Provider, MD  CHLOROPHYLL PO Take 2 tablets by mouth once a week.     Historical Provider, MD  clopidogrel (PLAVIX) 75 MG tablet Take 1 tablet (75 mg total) by mouth daily. 06/28/16   Dayna N Dunn, PA-C  finasteride  (PROSCAR) 5 MG tablet Take 5 mg by mouth daily. 07/04/15   Historical Provider, MD  folic acid (FOLVITE) Q000111Q MCG tablet Take 800 mcg by mouth every other day.    Historical Provider, MD  hydrochlorothiazide (MICROZIDE) 12.5 MG capsule TAKE 1 CAPSULE (12.5 MG TOTAL) BY MOUTH DAILY. 07/12/16   Belva Crome, MD  isosorbide mononitrate (IMDUR) 60 MG 24 hr tablet Take 1.5 tablets (90 mg total) by mouth daily. Take one and a half (1.5) tablet (90 mg total) by mouth daily. 07/09/16   Dayna N Dunn, PA-C  levothyroxine (SYNTHROID, LEVOTHROID) 125 MCG tablet Take 125 mcg by mouth daily before breakfast.  11/23/14   Historical Provider, MD  Magnesium 200 MG TABS Take 200 mg by mouth daily.    Historical Provider, MD  metoprolol tartrate (LOPRESSOR) 25 MG tablet Take 0.5 tablets (12.5 mg total) by mouth 2 (two) times daily. 06/28/16   Dayna N Dunn, PA-C  Multiple Vitamins-Iron (  CHLORELLA PO) Take 4 tablets by mouth 2 (two) times daily. MED NAME: CHLORELLA    Historical Provider, MD  NAT-RUL PSYLLIUM SEED HUSKS PO Take 2 tablets by mouth once a week.     Historical Provider, MD  nitroGLYCERIN (NITROSTAT) 0.4 MG SL tablet Place 1 tablet (0.4 mg total) under the tongue every 5 (five) minutes as needed for chest pain. 01/05/15   Belva Crome, MD  SELENIUM PO Take 1 tablet by mouth every other day.     Historical Provider, MD  tamsulosin (FLOMAX) 0.4 MG CAPS capsule Take 0.4 mg by mouth 2 (two) times daily. 05/01/16   Historical Provider, MD    Family History Family History  Problem Relation Age of Onset  . Colon cancer Father   . Stroke Mother   . CAD Brother   . Stroke Sister   . Bone cancer Brother   . Parkinson's disease Brother   . Dementia Sister   . Stroke Sister     Social History Social History  Substance Use Topics  . Smoking status: Former Smoker    Packs/day: 0.50    Years: 15.00    Types: Cigarettes    Quit date: 06/22/1953  . Smokeless tobacco: Never Used     Comment: quit smoking  in 1962    . Alcohol use No     Allergies   No known allergies   Review of Systems Review of Systems  10 Systems reviewed and are negative for acute change except as noted in the HPI.   Physical Exam Updated Vital Signs BP 148/90   Pulse 99   Temp 97.4 F (36.3 C)   Ht 5\' 10"  (1.778 m)   Wt 99.8 kg   SpO2 100%   BMI 31.57 kg/m   Physical Exam  Constitutional: He is oriented to person, place, and time. He appears well-developed and well-nourished.  HENT:  Head: Normocephalic and atraumatic.  Right Ear: External ear normal.  Left Ear: External ear normal.  Nose: Nose normal.  Mouth/Throat: Oropharynx is clear and moist.  Eyes: Conjunctivae and EOM are normal. Pupils are equal, round, and reactive to light.  Neck: Normal range of motion. Neck supple.  Cardiovascular: Normal rate, regular rhythm and intact distal pulses.   Pulmonary/Chest: Effort normal and breath sounds normal. No respiratory distress.  Abdominal: Soft. Bowel sounds are normal. He exhibits no distension. There is tenderness (suprapubic).  Musculoskeletal: Normal range of motion. He exhibits no edema.  Neurological: He is alert and oriented to person, place, and time. No sensory deficit. He exhibits normal muscle tone.  Skin: Skin is warm and dry. Capillary refill takes less than 2 seconds.  Psychiatric: He has a normal mood and affect. His behavior is normal. Judgment and thought content normal.    ED Treatments / Results  Labs (all labs ordered are listed, but only abnormal results are displayed) Labs Reviewed  URINALYSIS, ROUTINE W REFLEX MICROSCOPIC (NOT AT [facility]    EKG  EKG Interpretation None       Radiology No results found.  Procedures Procedures (including critical care time)  Medications Ordered in ED Medications - No data to display   Initial Impression / Assessment and Plan / ED Course  I have reviewed the triage vital signs and the nursing notes.  Pertinent labs & imaging  results that were available during my care of the patient were reviewed by me and considered in my medical decision making (see chart for details).  Clinical Course  Tim Campbell is a 82 y.o. male with PMH of CAD s/p CABG, HTN, HLD, hypothyroidism, large cell non-Hodgkins lymphoma, severe aortic valve stenosis, and BPH who presents with difficulty urinating 2 days.  Vital signs within normal limits with the exception of slightly elevated blood pressure of 148/90. Patient afebrile. Physical exam showing tenderness to palpation in suprapubic area but otherwise unremarkable.  Bladder scan revealing only 30 mL of fluid. Attempted to have patient drink fluids to see if he could provide a urine specimen the patient was hesitant as he thought this would worsen his urinary retention. Foley catheter was placed and 500cc of urine was removed. Urine specimen obtained for urinalysis. Foley catheter left in place.   Patient instructed to follow-up with his urologist as soon as possible and to take his Flomax and Finasteride as prescribed. Stable for discharge.   Final Clinical Impressions(s) / ED Diagnoses   Final diagnoses:  Urinary retention    New Prescriptions New Prescriptions   No medications on file     Carlyle Dolly, MD 09/06/16 [id number]     Carlyle Dolly, MD 09/06/16 [place], MD 09/06/16 1454

## 2016-09-09 ENCOUNTER — Encounter (HOSPITAL_BASED_OUTPATIENT_CLINIC_OR_DEPARTMENT_OTHER): Payer: Self-pay | Admitting: *Deleted

## 2016-09-09 ENCOUNTER — Emergency Department (HOSPITAL_BASED_OUTPATIENT_CLINIC_OR_DEPARTMENT_OTHER)
Admission: EM | Admit: 2016-09-09 | Discharge: 2016-09-10 | Disposition: A | Discharge status: Home / Self Care | Payer: Medicare Other | Source: Home / Self Care | Attending: Emergency Medicine | Admitting: Emergency Medicine

## 2016-09-09 ENCOUNTER — Emergency Department (HOSPITAL_BASED_OUTPATIENT_CLINIC_OR_DEPARTMENT_OTHER)
Admission: EM | Admit: 2016-09-09 | Discharge: 2016-09-09 | Disposition: A | Discharge status: Home / Self Care | Payer: Medicare Other | Attending: Emergency Medicine | Admitting: Emergency Medicine

## 2016-09-09 DIAGNOSIS — Z951 Presence of aortocoronary bypass graft: Secondary | ICD-10-CM | POA: Diagnosis not present

## 2016-09-09 DIAGNOSIS — I5042 Chronic combined systolic (congestive) and diastolic (congestive) heart failure: Secondary | ICD-10-CM

## 2016-09-09 DIAGNOSIS — I252 Old myocardial infarction: Secondary | ICD-10-CM | POA: Diagnosis not present

## 2016-09-09 DIAGNOSIS — I110 Hypertensive heart disease with heart failure: Secondary | ICD-10-CM | POA: Insufficient documentation

## 2016-09-09 DIAGNOSIS — Y733 Surgical instruments, materials and gastroenterology and urology devices (including sutures) associated with adverse incidents: Secondary | ICD-10-CM | POA: Insufficient documentation

## 2016-09-09 DIAGNOSIS — Z79899 Other long term (current) drug therapy: Secondary | ICD-10-CM | POA: Insufficient documentation

## 2016-09-09 DIAGNOSIS — T83098A Other mechanical complication of other indwelling urethral catheter, initial encounter: Secondary | ICD-10-CM | POA: Diagnosis present

## 2016-09-09 DIAGNOSIS — N4889 Other specified disorders of penis: Secondary | ICD-10-CM | POA: Diagnosis not present

## 2016-09-09 DIAGNOSIS — R339 Retention of urine, unspecified: Secondary | ICD-10-CM

## 2016-09-09 DIAGNOSIS — Z466 Encounter for fitting and adjustment of urinary device: Secondary | ICD-10-CM | POA: Diagnosis not present

## 2016-09-09 DIAGNOSIS — Z87891 Personal history of nicotine dependence: Secondary | ICD-10-CM | POA: Insufficient documentation

## 2016-09-09 DIAGNOSIS — I25810 Atherosclerosis of coronary artery bypass graft(s) without angina pectoris: Secondary | ICD-10-CM | POA: Insufficient documentation

## 2016-09-09 DIAGNOSIS — I2510 Atherosclerotic heart disease of native coronary artery without angina pectoris: Secondary | ICD-10-CM | POA: Insufficient documentation

## 2016-09-09 DIAGNOSIS — R319 Hematuria, unspecified: Secondary | ICD-10-CM | POA: Insufficient documentation

## 2016-09-09 DIAGNOSIS — E039 Hypothyroidism, unspecified: Secondary | ICD-10-CM

## 2016-09-09 LAB — URINALYSIS, ROUTINE W REFLEX MICROSCOPIC
Bilirubin Urine: NEGATIVE
GLUCOSE, UA: NEGATIVE mg/dL
Ketones, ur: NEGATIVE mg/dL
Nitrite: NEGATIVE
PH: 5.5 (ref 5.0–8.0)
PROTEIN: 100 mg/dL — AB
SPECIFIC GRAVITY, URINE: 1.023 (ref 1.005–1.030)

## 2016-09-09 LAB — URINE MICROSCOPIC-ADD ON

## 2016-09-09 NOTE — ED Provider Notes (Signed)
[facility] DEPT [facility] Provider Note   CSN: [id number] Arrival date & time: 09/09/16  2210  By signing my name below, I, Dora Sims, attest that this documentation has been prepared under the direction and in the presence of Gay Filler, PA-C. Electronically Signed: Dora Sims, Scribe. 09/09/2016. 11:26 PM.  History   Chief Complaint Chief Complaint  Patient presents with  . Urinary Retention    The history is provided by the patient. No language interpreter was used.     HPI Comments: Tim Campbell is a 82 y.o. male with h/o AAA, arthritis, combined systolic and diastolic HF, CAD, enlarged prostate, HLD, HTN, hypothyroidism who presents to the Emergency Department complaining of urinary retention beginning this afternoon. Patient was seen here earlier today due to irritation around his foley catheter; he had it taken out and reports he has had difficulty urinating since. Pt reports he had blood in his urine earlier today but was negative for infection via urinalysis. He notes some lower abdominal pain PTA which has resolved since having a catheter placed tonight. He has a follow up appointment with urology tomorrow. He uses medication for HTN daily. He denies dysuria, fever, chills, nausea, vomiting, CP, SOB, leg swelling, rash, syncope, trouble swallowing, double/blurry vision, back pain, flank pain, or any other associated symptoms.  Past Medical History:  Diagnosis Date  . AAA (abdominal aortic aneurysm) ([place])    a. 3.3x3.0cm by CT 05/2016.  . Arthritis    osteo knees "   . Chronic combined systolic and diastolic HF (heart failure), NYHA class 3   . Coronary artery disease    a. s/p CABGx5 in 1993. b. NSTEMI with occluded VG-diag in 2005. c. BMS to prox VG-PDA 2012. d. BMS to SVG-OM 03/2014. e. stable cath 05/2016.  . Diverticulosis   . Enlarged prostate    per patient  with mild effect on urine stream  . Heart murmur   . Hyperlipidemia   . Hypertension   .  Hypothyroidism   . Insomnia   . Non Hodgkin's lymphoma ([place]) 2007  . Non-ST elevation MI (NSTEMI) ([place])   . Pulmonary nodules    a. by CT 05/2016  . RBBB   . 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 Severe aortic stenosis    a. s/p TAVR 06/2016.  . Vitamin D deficiency     Patient Active Problem List   Diagnosis Date Noted  . Pulmonary nodules 07/07/2016  . S/P TAVR (transcatheter aortic valve replacement) 06/28/2016  . Hypotension 06/28/2016  . Thrombocytopenia ([place]) 06/28/2016  . Hypothyroidism 06/28/2016  . RBBB 06/28/2016  . AAA (abdominal aortic aneurysm) ([place])   . Severe aortic valve stenosis 06/26/2016  . Postoperative wound infection 03/11/2016  . Preoperative cardiovascular examination 02/20/2016  . Hyperlipidemia 06/17/2014  . Chronic combined systolic and diastolic HF (heart failure), NYHA class 3 ([place]) 09/09/2013  . CAD (coronary artery disease) of artery bypass graft 01/03/2013  . HTN (hypertension) 01/03/2013  . Non-ST elevation MI (NSTEMI) ([place]) 01/02/2013    Class: Acute    Past Surgical History:  Procedure Laterality Date  . ANKLE SURGERY Right   . CARDIAC CATHETERIZATION N/A 05/17/2016   Procedure: Right/Left Heart Cath and Coronary/Graft Angiography;  Surgeon: Nelva Bush, MD;  Location: [facility];  Service: Cardiovascular;  Laterality: N/A;  . CATARACT EXTRACTION W/ INTRAOCULAR LENS  IMPLANT, BILATERAL    . COLONOSCOPY    . CORONARY ARTERY BYPASS GRAFT  1993  . CORONARY STENT PLACEMENT  04/21/2014   bare metal SVG   DR Tamala Julian  . HEMORRHOID SURGERY    . HERNIA REPAIR    . I&D EXTREMITY Left 03/09/2016   Procedure: IRRIGATION AND DEBRIDEMENT WRIST;  Surgeon: Milly Jakob, MD;  Location: [facility];  Service: Orthopedics;  Laterality: Left;  . KNEE ARTHROSCOPY Right 1970'S   . LEFT AND RIGHT HEART CATHETERIZATION WITH CORONARY/GRAFT ANGIOGRAM N/A 04/05/2014   Procedure: LEFT AND RIGHT HEART CATHETERIZATION WITH [name];  Surgeon: Burnell Blanks, MD;  Location: [facility] CATH LAB;  Service: Cardiovascular;  Laterality: N/A;  . LEFT HEART CATHETERIZATION WITH CORONARY/GRAFT ANGIOGRAM N/A 01/02/2013   Procedure: LEFT HEART CATHETERIZATION WITH [name];  Surgeon: [name], MD;  Location: [facility] CATH LAB;  Service: Cardiovascular;  Laterality: N/A;  . OPEN REDUCTION INTERNAL FIXATION (ORIF) DISTAL RADIAL FRACTURE Left 02/24/2016   Procedure: OPEN TREATMENT OF LEFT DISTAL RADIUS FRACTURE;  Surgeon: Milly Jakob, MD;  Location: [facility];  Service: Orthopedics;  Laterality: Left;  . PERCUTANEOUS CORONARY STENT INTERVENTION (PCI-S) N/A 04/21/2014   Procedure: PERCUTANEOUS CORONARY STENT INTERVENTION (PCI-S);  Surgeon: [name], MD;  Location: [facility] CATH LAB;  Service: Cardiovascular;  Laterality: N/A;  . Right inguinal hernia repair    . TEE WITHOUT CARDIOVERSION N/A 06/26/2016   Procedure: TRANSESOPHAGEAL ECHOCARDIOGRAM (TEE);  Surgeon: Burnell Blanks, MD;  Location: [facility];  Service: Open Heart Surgery;  Laterality: N/A;  . TRANSCATHETER AORTIC VALVE REPLACEMENT, TRANSFEMORAL Bilateral 06/26/2016   Procedure: TRANSCATHETER AORTIC VALVE REPLACEMENT, TRANSFEMORAL;  Surgeon: Burnell Blanks, MD;  Location: [facility];  Service: Open Heart Surgery;  Laterality: Bilateral;  . WRIST SURGERY Left 2017   fx-plate       Home Medications    Prior to Admission medications   Medication Sig Start Date End Date Taking? Authorizing Provider  Ascorbic Acid (VITAMIN C) 1000 MG tablet Take 1,000 mg by mouth every other day.    Historical Provider, MD  aspirin EC 81 MG tablet Take 81 mg by mouth daily.    Historical Provider, MD  atorvastatin (LIPITOR) 40 MG tablet Take 40 mg by mouth daily.    Historical Provider, MD  B Complex-C (B-COMPLEX WITH VITAMIN C) tablet Take 1 tablet by mouth 3 (three) times a week.    Historical Provider, MD  CHLOROPHYLL PO Take 2 tablets by mouth once a week.      Historical Provider, MD  clopidogrel (PLAVIX) 75 MG tablet Take 1 tablet (75 mg total) by mouth daily. 06/28/16   Dayna N Dunn, PA-C  finasteride (PROSCAR) 5 MG tablet Take 5 mg by mouth daily. 07/04/15   Historical Provider, MD  folic acid (FOLVITE) Q000111Q MCG tablet Take 800 mcg by mouth every other day.    Historical Provider, MD  hydrochlorothiazide (MICROZIDE) 12.5 MG capsule TAKE 1 CAPSULE (12.5 MG TOTAL) BY MOUTH DAILY. 07/12/16   Belva Crome, MD  isosorbide mononitrate (IMDUR) 60 MG 24 hr tablet Take 1.5 tablets (90 mg total) by mouth daily. Take one and a half (1.5) tablet (90 mg total) by mouth daily. 07/09/16   Dayna N Dunn, PA-C  levothyroxine (SYNTHROID, LEVOTHROID) 125 MCG tablet Take 125 mcg by mouth daily before breakfast.  11/23/14   Historical Provider, MD  Magnesium 200 MG TABS Take 200 mg by mouth daily.    Historical Provider, MD  metoprolol tartrate (LOPRESSOR) 25 MG tablet Take 0.5 tablets (12.5 mg total) by mouth 2 (two) times daily. 06/28/16   Dayna  N Dunn, PA-C  Multiple Vitamins-Iron (CHLORELLA PO) Take 4 tablets by mouth 2 (two) times daily. MED NAME: CHLORELLA    Historical Provider, MD  NAT-RUL PSYLLIUM SEED HUSKS PO Take 2 tablets by mouth once a week.     Historical Provider, MD  nitroGLYCERIN (NITROSTAT) 0.4 MG SL tablet Place 1 tablet (0.4 mg total) under the tongue every 5 (five) minutes as needed for chest pain. 01/05/15   Belva Crome, MD  SELENIUM PO Take 1 tablet by mouth every other day.     Historical Provider, MD  tamsulosin (FLOMAX) 0.4 MG CAPS capsule Take 0.4 mg by mouth 2 (two) times daily. 05/01/16   Historical Provider, MD    Family History Family History  Problem Relation Age of Onset  . Colon cancer Father   . Stroke Mother   . CAD Brother   . Stroke Sister   . Bone cancer Brother   . Parkinson's disease Brother   . Dementia Sister   . Stroke Sister     Social History Social History  Substance Use Topics  . Smoking status: Former Smoker     Packs/day: 0.50    Years: 15.00    Types: Cigarettes    Quit date: 06/22/1953  . Smokeless tobacco: Never Used     Comment: quit smoking  in 1962   . Alcohol use No     Allergies   No known allergies   Review of Systems Review of Systems  Constitutional: Negative for chills and fever.  HENT: Negative for trouble swallowing.   Eyes: Negative for visual disturbance.  Respiratory: Negative for shortness of breath.   Cardiovascular: Negative for chest pain and leg swelling.  Gastrointestinal: Positive for abdominal pain (resolved). Negative for nausea and vomiting.  Genitourinary: Positive for difficulty urinating and hematuria. Negative for dysuria and flank pain.  Musculoskeletal: Negative for back pain.  Skin: Negative for rash.  Neurological: Negative for syncope.     Physical Exam Updated Vital Signs BP 127/65 (BP Location: Right Arm)   Pulse 61   Temp 98.1 F (36.7 C) (Oral)   Resp 18   Ht 5\' 10"  (1.778 m)   Wt 99.8 kg   SpO2 96%   BMI 31.57 kg/m   Physical Exam  Constitutional: He appears well-developed and well-nourished. No distress.  HENT:  Head: Normocephalic and atraumatic.  Mouth/Throat: Oropharynx is clear and moist. No oropharyngeal exudate.  Eyes: Conjunctivae and EOM are normal. Pupils are equal, round, and reactive to light. Right eye exhibits no discharge. Left eye exhibits no discharge. No scleral icterus.  Neck: Normal range of motion and phonation normal. Neck supple. No neck rigidity. Normal range of motion present.  Cardiovascular: Normal rate, regular rhythm and intact distal pulses.   Murmur (systolic ejection) heard. Pulmonary/Chest: Effort normal and breath sounds normal. No stridor. No respiratory distress. He has no wheezes. He has no rales.  Abdominal: Soft. Bowel sounds are normal. He exhibits no distension. There is no tenderness. There is no rigidity, no rebound, no guarding and no CVA tenderness.  No abdominal tenderness on exam.    Genitourinary: Uncircumcised.  Genitourinary Comments: Chaperon present for duration of exam. Foley catheter in place. Easily retractable foreskin.  ~ 800 cc's of amber colored urine in foley bag.  Musculoskeletal: Normal range of motion.  No lower extremity swelling.  Lymphadenopathy:    He has no cervical adenopathy.  Neurological: He is alert. He is not disoriented. Coordination and gait normal. GCS eye subscore  is 4. GCS verbal subscore is 5. GCS motor subscore is 6.  Skin: Skin is warm and dry. He is not diaphoretic.  Psychiatric: He has a normal mood and affect. His behavior is normal.     ED Treatments / Results  Labs (all labs ordered are listed, but only abnormal results are displayed) Labs Reviewed - No data to display  EKG  EKG Interpretation None       Radiology No results found.  Procedures Procedures (including critical care time)  DIAGNOSTIC STUDIES: Oxygen Saturation is 96% on RA, adequate by my interpretation.    COORDINATION OF CARE: 11:40 PM Discussed treatment plan with pt at bedside and pt agreed to plan.  Medications Ordered in ED Medications - No data to display   Initial Impression / Assessment and Plan / ED Course  I have reviewed the triage vital signs and the nursing notes.  Pertinent labs & imaging results that were available during my care of the patient were reviewed by me and considered in my medical decision making (see chart for details).  Clinical Course     Patient presents to ED with complaint of urinary retention onset today. Patient was seen earlier today for foley catheter removal; however, started to experiencing retention this evening with associated lower abdominal discomfort. Patient is afebrile and non-toxic appearing in NAD. VSS. Bladder scan remarkable for ~719ml. Foley catheter is in place with ~800cc of amber colored urine. Resolution of lower abdominal discomfort. Benign abdominal exam. U/A from earlier today negative  for UTI, hemoglobin present. Patient to follow up with urology in AM. Return precautions given. Pt voiced understanding and is agreeable. Pt is stable for dc.    I personally performed the services described in this documentation, which was scribed in my presence. The recorded information has been reviewed and is accurate.   Final Clinical Impressions(s) / ED Diagnoses   Final diagnoses:  Urinary retention    New Prescriptions Discharge Medication List as of 09/09/2016 11:45 PM       Roxanna Mew, PA-C 09/10/16 0111    Fatima Blank, MD 09/10/16 1038

## 2016-09-09 NOTE — ED Notes (Signed)
F/c removed ,po fluid given

## 2016-09-09 NOTE — ED Notes (Signed)
States was seen in ED this am and had his catheter removed due to bleeding at insertion site and irritation. Only able to " dribble " and bladder discomfort

## 2016-09-09 NOTE — ED Triage Notes (Signed)
Pt c/o bleeding around catheter  x 2 days, seen here 3 days ago for urine retention, f/c placed, appt with urology tomorrow

## 2016-09-09 NOTE — ED Notes (Signed)
Bladder scan done 744ml noted. ED MD informed.

## 2016-09-09 NOTE — ED Notes (Signed)
Pt alert, NAD, calm, interactive, resps e/u, no dyspnea noted, (denies: pain or nausea), "feel better". Family x3 at [facility]

## 2016-09-09 NOTE — ED Provider Notes (Signed)
[facility] DEPT [facility] Provider Note   CSN: [id number] Arrival date & time: 09/09/16  [id number]     History   Chief Complaint Chief Complaint  Patient presents with  . f/c issue    HPI Tim Campbell is a 82 y.o. male.  Pt presents to the ED today with irritation around his foley catheter.  He wants it taken out.  He came to the ED on 11/30 for urinary retention and had the catheter placed then.  The pt said he has had some bleeding from around the catheter site.  He has an appt with urology tomorrow, but did not want to wait.      Past Medical History:  Diagnosis Date  . AAA (abdominal aortic aneurysm) ([place])    a. 3.3x3.0cm by CT 05/2016.  . Arthritis    osteo knees "   . Chronic combined systolic and diastolic HF (heart failure), NYHA class 3   . Coronary artery disease    a. s/p CABGx5 in 1993. b. NSTEMI with occluded VG-diag in 2005. c. BMS to prox VG-PDA 2012. d. BMS to SVG-OM 03/2014. e. stable cath 05/2016.  . Diverticulosis   . Enlarged prostate    per patient  with mild effect on urine stream  . Heart murmur   . Hyperlipidemia   . Hypertension   . Hypothyroidism   . Insomnia   . Non Hodgkin's lymphoma ([place]) 2007  . Non-ST elevation MI (NSTEMI) ([place])   . Pulmonary nodules    a. by CT 05/2016  . RBBB   . 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 Severe aortic stenosis    a. s/p TAVR 06/2016.  . Vitamin D deficiency     Patient Active Problem List   Diagnosis Date Noted  . Pulmonary nodules 07/07/2016  . S/P TAVR (transcatheter aortic valve replacement) 06/28/2016  . Hypotension 06/28/2016  . Thrombocytopenia ([place]) 06/28/2016  . Hypothyroidism 06/28/2016  . RBBB 06/28/2016  . AAA (abdominal aortic aneurysm) ([place])   . Severe aortic valve stenosis 06/26/2016  . Postoperative wound infection 03/11/2016  . Preoperative cardiovascular examination 02/20/2016  . Hyperlipidemia 06/17/2014  . Chronic combined systolic and diastolic HF (heart  failure), NYHA class 3 ([place]) 09/09/2013  . CAD (coronary artery disease) of artery bypass graft 01/03/2013  . HTN (hypertension) 01/03/2013  . Non-ST elevation MI (NSTEMI) ([place]) 01/02/2013    Class: Acute    Past Surgical History:  Procedure Laterality Date  . ANKLE SURGERY Right   . CARDIAC CATHETERIZATION N/A 05/17/2016   Procedure: Right/Left Heart Cath and Coronary/Graft Angiography;  Surgeon: Nelva Bush, MD;  Location: [facility];  Service: Cardiovascular;  Laterality: N/A;  . CATARACT EXTRACTION W/ INTRAOCULAR LENS  IMPLANT, BILATERAL    . COLONOSCOPY    . CORONARY ARTERY BYPASS GRAFT  1993  . CORONARY STENT PLACEMENT  04/21/2014   bare metal SVG   DR Tamala Julian  . HEMORRHOID SURGERY    . HERNIA REPAIR    . I&D EXTREMITY Left 03/09/2016   Procedure: IRRIGATION AND DEBRIDEMENT WRIST;  Surgeon: Milly Jakob, MD;  Location: [place];  Service: Orthopedics;  Laterality: Left;  . KNEE ARTHROSCOPY Right 1970'S   . LEFT AND RIGHT HEART CATHETERIZATION WITH CORONARY/GRAFT ANGIOGRAM N/A 04/05/2014   Procedure: LEFT AND RIGHT HEART CATHETERIZATION WITH [name];  Surgeon: Burnell Blanks, MD;  Location: [facility] C[facility] LAB;  Service: Cardiovascular;  Laterality: N/A;  . LEFT HEART CATHETERIZATION WITH CORONARY/GRAFT ANGIOGRAM N/A 01/02/2013  Procedure: LEFT HEART CATHETERIZATION WITH [name];  Surgeon: [name], MD;  Location: [facility] CATH LAB;  Service: Cardiovascular;  Laterality: N/A;  . OPEN REDUCTION INTERNAL FIXATION (ORIF) DISTAL RADIAL FRACTURE Left 02/24/2016   Procedure: OPEN TREATMENT OF LEFT DISTAL RADIUS FRACTURE;  Surgeon: Milly Jakob, MD;  Location: [facility];  Service: Orthopedics;  Laterality: Left;  . PERCUTANEOUS CORONARY STENT INTERVENTION (PCI-S) N/A 04/21/2014   Procedure: PERCUTANEOUS CORONARY STENT INTERVENTION (PCI-S);  Surgeon: [name], MD;  Location: [facility] C[facility] LAB;  Service: Cardiovascular;  Laterality: N/A;  . Right  inguinal hernia repair    . TEE WITHOUT CARDIOVERSION N/A 06/26/2016   Procedure: TRANSESOPHAGEAL ECHOCARDIOGRAM (TEE);  Surgeon: Burnell Blanks, MD;  Location: [place];  Service: Open Heart Surgery;  Laterality: N/A;  . TRANSCATHETER AORTIC VALVE REPLACEMENT, TRANSFEMORAL Bilateral 06/26/2016   Procedure: TRANSCATHETER AORTIC VALVE REPLACEMENT, TRANSFEMORAL;  Surgeon: Burnell Blanks, MD;  Location: [place];  Service: Open Heart Surgery;  Laterality: Bilateral;  . WRIST SURGERY Left 2017   fx-plate       Home Medications    Prior to Admission medications   Medication Sig Start Date End Date Taking? Authorizing Provider  Ascorbic Acid (VITAMIN C) 1000 MG tablet Take 1,000 mg by mouth every other day.    Historical Provider, MD  aspirin EC 81 MG tablet Take 81 mg by mouth daily.    Historical Provider, MD  atorvastatin (LIPITOR) 40 MG tablet Take 40 mg by mouth daily.    Historical Provider, MD  B Complex-C (B-COMPLEX WITH VITAMIN C) tablet Take 1 tablet by mouth 3 (three) times a week.    Historical Provider, MD  CHLOROPHYLL PO Take 2 tablets by mouth once a week.     Historical Provider, MD  clopidogrel (PLAVIX) 75 MG tablet Take 1 tablet (75 mg total) by mouth daily. 06/28/16   Dayna N Dunn, PA-C  finasteride (PROSCAR) 5 MG tablet Take 5 mg by mouth daily. 07/04/15   Historical Provider, MD  folic acid (FOLVITE) Q000111Q MCG tablet Take 800 mcg by mouth every other day.    Historical Provider, MD  hydrochlorothiazide (MICROZIDE) 12.5 MG capsule TAKE 1 CAPSULE (12.5 MG TOTAL) BY MOUTH DAILY. 07/12/16   Belva Crome, MD  isosorbide mononitrate (IMDUR) 60 MG 24 hr tablet Take 1.5 tablets (90 mg total) by mouth daily. Take one and a half (1.5) tablet (90 mg total) by mouth daily. 07/09/16   Dayna N Dunn, PA-C  levothyroxine (SYNTHROID, LEVOTHROID) 125 MCG tablet Take 125 mcg by mouth daily before breakfast.  11/23/14   Historical Provider, MD  Magnesium 200 MG TABS Take 200 mg by mouth  daily.    Historical Provider, MD  metoprolol tartrate (LOPRESSOR) 25 MG tablet Take 0.5 tablets (12.5 mg total) by mouth 2 (two) times daily. 06/28/16   Dayna N Dunn, PA-C  Multiple Vitamins-Iron (CHLORELLA PO) Take 4 tablets by mouth 2 (two) times daily. MED NAME: CHLORELLA    Historical Provider, MD  NAT-RUL PSYLLIUM SEED HUSKS PO Take 2 tablets by mouth once a week.     Historical Provider, MD  nitroGLYCERIN (NITROSTAT) 0.4 MG SL tablet Place 1 tablet (0.4 mg total) under the tongue every 5 (five) minutes as needed for chest pain. 01/05/15   Belva Crome, MD  SELENIUM PO Take 1 tablet by mouth every other day.     Historical Provider, MD  tamsulosin (FLOMAX) 0.4 MG CAPS capsule Take 0.4 mg by mouth  2 (two) times daily. 05/01/16   Historical Provider, MD    Family History Family History  Problem Relation Age of Onset  . Colon cancer Father   . Stroke Mother   . CAD Brother   . Stroke Sister   . Bone cancer Brother   . Parkinson's disease Brother   . Dementia Sister   . Stroke Sister     Social History Social History  Substance Use Topics  . Smoking status: Former Smoker    Packs/day: 0.50    Years: 15.00    Types: Cigarettes    Quit date: 06/22/1953  . Smokeless tobacco: Never Used     Comment: quit smoking  in 1962   . Alcohol use No     Allergies   No known allergies   Review of Systems Review of Systems  Genitourinary: Positive for penile pain.  All other systems reviewed and are negative.    Physical Exam Updated Vital Signs BP (!) 110/53 (BP Location: Left Arm)   Pulse 60   Temp 98 F (36.7 C)   Resp 17   Wt 220 lb (99.8 kg)   SpO2 97%   BMI 31.57 kg/m   Physical Exam  Constitutional: He is oriented to person, place, and time. He appears well-developed and well-nourished.  HENT:  Head: Normocephalic and atraumatic.  Right Ear: External ear normal.  Left Ear: External ear normal.  Nose: Nose normal.  Mouth/Throat: Oropharynx is clear and moist.    Eyes: Conjunctivae and EOM are normal. Pupils are equal, round, and reactive to light.  Neck: Normal range of motion. Neck supple.  Cardiovascular: Normal rate, regular rhythm, normal heart sounds and intact distal pulses.   Pulmonary/Chest: Effort normal and breath sounds normal.  Abdominal: Soft. Bowel sounds are normal.  Genitourinary: Testes normal. Cremasteric reflex is present. Uncircumcised.  Genitourinary Comments: Foley in placed.  Local irritation and slight bleeding around catheter insertion site.  Musculoskeletal: Normal range of motion.  Neurological: He is alert and oriented to person, place, and time.  Skin: Skin is warm.  Psychiatric: He has a normal mood and affect. His behavior is normal. Judgment and thought content normal.  Nursing note and vitals reviewed.    ED Treatments / Results  Labs (all labs ordered are listed, but only abnormal results are displayed) Labs Reviewed  URINALYSIS, ROUTINE W REFLEX MICROSCOPIC (NOT AT [facility]) - Abnormal; Notable for the following:       Result Value   Color, Urine AMBER (*)    APPearance CLOUDY (*)    Hgb urine dipstick LARGE (*)    Protein, ur 100 (*)    Leukocytes, UA SMALL (*)    All other components within normal limits  URINE MICROSCOPIC-ADD ON - Abnormal; Notable for the following:    Squamous Epithelial / LPF 0-5 (*)    Bacteria, UA MANY (*)    Casts HYALINE CASTS (*)    All other components within normal limits  URINE CULTURE    EKG  EKG Interpretation None       Radiology No results found.  Procedures Procedures (including critical care time)  Medications Ordered in ED Medications - No data to display   Initial Impression / Assessment and Plan / ED Course  I have reviewed the triage vital signs and the nursing notes.  Pertinent labs & imaging results that were available during my care of the patient were reviewed by me and considered in my medical decision making (see chart for  details).  Clinical Course     Foley removed per patient's request.  He was able to urinate after catheter was removed.  His urine does have some bacteria in it, but no nitrites or LE.  Hematuria from meatus irritation.  Pt's urine will be sent for culture.  He is told to keep his urology appt tomorrow.  He knows to return if worse.  Final Clinical Impressions(s) / ED Diagnoses   Final diagnoses:  Encounter for Foley catheter removal    New Prescriptions New Prescriptions   No medications on file     Isla Pence, MD 09/09/16 1104

## 2016-09-09 NOTE — ED Triage Notes (Signed)
Urinary retention.  Catheter removed this morning in the ED.  F/Uwith urology tomorrow.

## 2016-09-09 NOTE — Discharge Instructions (Signed)
Read the information below.  A catheter was placed for urinary retention which relieved your symptoms of lower abdominal discomfort.  Your urine from earlier today showed blood, but no evidence of infection.  Please keep the catheter in until evaluated by urology - keep your scheduled appointment tomorrow.  Use the prescribed medication as directed.  Please discuss all new medications with your pharmacist.   You may return to the Emergency Department at any time for worsening condition or any new symptoms that concern you. Return to ED if you develop fever, burning with urination, nausea, vomiting, abdominal pain, altered mental status, or any other new/concerning symptoms.

## 2016-09-09 NOTE — ED Notes (Signed)
Pt able to void

## 2016-09-10 DIAGNOSIS — R338 Other retention of urine: Secondary | ICD-10-CM | POA: Diagnosis not present

## 2016-09-10 DIAGNOSIS — N401 Enlarged prostate with lower urinary tract symptoms: Secondary | ICD-10-CM | POA: Diagnosis not present

## 2016-09-12 LAB — URINE CULTURE

## 2016-09-18 DIAGNOSIS — N401 Enlarged prostate with lower urinary tract symptoms: Secondary | ICD-10-CM | POA: Diagnosis not present

## 2016-09-18 DIAGNOSIS — R338 Other retention of urine: Secondary | ICD-10-CM | POA: Diagnosis not present

## 2016-10-09 DIAGNOSIS — R338 Other retention of urine: Secondary | ICD-10-CM | POA: Diagnosis not present

## 2016-10-09 DIAGNOSIS — N401 Enlarged prostate with lower urinary tract symptoms: Secondary | ICD-10-CM | POA: Diagnosis not present

## 2016-10-15 DIAGNOSIS — R338 Other retention of urine: Secondary | ICD-10-CM | POA: Diagnosis not present

## 2016-10-16 DIAGNOSIS — N401 Enlarged prostate with lower urinary tract symptoms: Secondary | ICD-10-CM | POA: Diagnosis not present

## 2016-10-16 DIAGNOSIS — R351 Nocturia: Secondary | ICD-10-CM | POA: Diagnosis not present

## 2016-10-18 ENCOUNTER — Telehealth: Payer: Self-pay | Admitting: Interventional Cardiology

## 2016-10-18 ENCOUNTER — Other Ambulatory Visit: Payer: Self-pay | Admitting: Urology

## 2016-10-18 NOTE — Telephone Encounter (Signed)
It appears that he is on both aspirin and Plavix. Widely asking to stop only aspirin?

## 2016-10-18 NOTE — Telephone Encounter (Signed)
Request for surgical clearance:  1. What type of surgery is being performed?  THULIUM LASER TURP (TRANSURETHRAL RESECTION OF PROSTATE)  2. When is this surgery scheduled? 11/12/16   3. Are there any medications that need to be held prior to surgery and how long? ASA for 5 days    4. Name of physician performing surgery? Dr.  Pilar Jarvis  5. What is your office phone and fax number? Fax [phone] Tim Campbell

## 2016-10-23 NOTE — Telephone Encounter (Signed)
Faxed clearance to [employer]

## 2016-10-23 NOTE — Telephone Encounter (Signed)
Can hold both 5 days prior to procedure and resume when safe.

## 2016-10-23 NOTE — Telephone Encounter (Signed)
Spoke with Marlowe Kays at [facility].   They were unaware of pt being on Plavix.  She state that will need to know about holding ASA and Plavix.

## 2016-11-08 ENCOUNTER — Encounter (HOSPITAL_BASED_OUTPATIENT_CLINIC_OR_DEPARTMENT_OTHER): Payer: Self-pay | Admitting: *Deleted

## 2016-11-09 ENCOUNTER — Encounter (HOSPITAL_BASED_OUTPATIENT_CLINIC_OR_DEPARTMENT_OTHER): Payer: Self-pay | Admitting: *Deleted

## 2016-11-09 NOTE — Progress Notes (Addendum)
SPOKE W/ PT AND WIFE.  NPO AFTER MN.  ARRIVE AT 0945.  NEEDS ISTAT 8.  CURRENT EKG IN CHART AND EPIC.  WILL TAKE IMDUR, FLOMAX, PROSCAR, SYNTHROID, LOPRESSOR, AND LIPITOR AM DOS W/ SIPS OF WATER.   ADDENDUM:  REVIEWED CHART W/ DR HOLLIS MDA,  STATED DUE TO EXTENSIVE CARDIAC HX AND CO-MORBIDITY'S , PT WOULD BE BEST DONE AT MAIN [facility] OR.  CALLED AND SPOKE TO CONI MABE, OR SCHEDULER FOR DR Pilar Jarvis,  TOLD HER OF DR HOLLIS RECOMMENDATION.

## 2016-11-22 ENCOUNTER — Encounter (HOSPITAL_COMMUNITY): Payer: Self-pay | Admitting: Internal Medicine

## 2016-11-22 NOTE — Progress Notes (Signed)
Mailed letter with [facility] to pt ... KJ  °

## 2016-11-26 NOTE — Patient Instructions (Signed)
Tim Campbell  11/26/2016   Your procedure is scheduled on: 12/05/2016    Report to [facility] Main  Entrance take [place]  elevators to 3rd floor to  [place] at    0930 AM.  Call this number if you have problems the morning of surgery [phone]   Remember: ONLY 1 PERSON MAY GO WITH YOU TO SHORT STAY TO GET  READY MORNING OF [name].  Do not eat food or drink liquids :After Midnight.     Take these medicines the morning of surgery with A SIP OF WATER: Proscar, Imdur, Synthroid, Metoprolol ( Lopressor), Flomax                                 You may not have any metal on your body including hair pins and              piercings  Do not wear jewelry,  lotions, powders or perfumes, deodorant                         Men may shave face and neck.   Do not bring valuables to the hospital. Tim Campbell.  Contacts, dentures or bridgework may not be worn into surgery.       Patients discharged the day of surgery will not be allowed to drive home.  Name and phone number of your driver:               Please read over the following fact sheets you were given: _____________________________________________________________________             [facility] - Preparing for Surgery Before surgery, you can play an important role.  Because skin is not sterile, your skin needs to be as free of germs as possible.  You can reduce the number of germs on your skin by washing with CHG (chlorahexidine gluconate) soap before surgery.  CHG is an antiseptic cleaner which kills germs and bonds with the skin to continue killing germs even after washing. Please DO NOT use if you have an allergy to CHG or antibacterial soaps.  If your skin becomes reddened/irritated stop using the CHG and inform your nurse when you arrive at Short Stay. Do not shave (including legs and underarms) for at least 48 hours prior to the first CHG  shower.  You may shave your face/neck. Please follow these instructions carefully:  1.  Shower with CHG Soap the night before surgery and the  morning of Surgery.  2.  If you choose to wash your hair, wash your hair first as usual with your  normal  shampoo.  3.  After you shampoo, rinse your hair and body thoroughly to remove the  shampoo.                           4.  Use CHG as you would any other liquid soap.  You can apply chg directly  to the skin and wash                       Gently with a scrungie or clean washcloth.  5.  Apply the CHG Soap to your body ONLY FROM THE NECK DOWN.   Do not use on face/ open                           Wound or open sores. Avoid contact with eyes, ears mouth and genitals (private parts).                       Wash face,  Genitals (private parts) with your normal soap.             6.  Wash thoroughly, paying special attention to the area where your surgery  will be performed.  7.  Thoroughly rinse your body with warm water from the neck down.  8.  DO NOT shower/wash with your normal soap after using and rinsing off  the CHG Soap.                9.  Pat yourself dry with a clean towel.            10.  Wear clean pajamas.            11.  Place clean sheets on your bed the night of your first shower and do not  sleep with pets. Day of Surgery : Do not apply any lotions/deodorants the morning of surgery.  Please wear clean clothes to the hospital/surgery center.  FAILURE TO FOLLOW THESE INSTRUCTIONS MAY RESULT IN THE CANCELLATION OF YOUR SURGERY PATIENT SIGNATURE_________________________________  NURSE SIGNATURE__________________________________  ________________________________________________________________________

## 2016-11-28 ENCOUNTER — Encounter (HOSPITAL_COMMUNITY): Payer: Self-pay

## 2016-11-28 ENCOUNTER — Encounter (HOSPITAL_COMMUNITY)
Admission: RE | Admit: 2016-11-28 | Discharge: 2016-11-28 | Disposition: A | Discharge status: Home / Self Care | Payer: Medicare Other | Source: Ambulatory Visit | Attending: Urology | Admitting: Urology

## 2016-11-28 DIAGNOSIS — Z01818 Encounter for other preprocedural examination: Secondary | ICD-10-CM | POA: Insufficient documentation

## 2016-11-28 DIAGNOSIS — N400 Enlarged prostate without lower urinary tract symptoms: Secondary | ICD-10-CM | POA: Diagnosis not present

## 2016-11-28 LAB — BASIC METABOLIC PANEL
Anion gap: 4 — ABNORMAL LOW (ref 5–15)
BUN: 19 mg/dL (ref 6–20)
CHLORIDE: 105 mmol/L (ref 101–111)
CO2: 29 mmol/L (ref 22–32)
CREATININE: 1.15 mg/dL (ref 0.61–1.24)
Calcium: 9 mg/dL (ref 8.9–10.3)
GFR calc Af Amer: >60 mL/min (ref >60)
GFR calc non Af Amer: 55 mL/min — ABNORMAL LOW (ref >60)
Glucose, Bld: 99 mg/dL (ref 65–99)
Potassium: 4.7 mmol/L (ref 3.5–5.1)
SODIUM: 138 mmol/L (ref 135–145)

## 2016-11-28 LAB — CBC
HCT: 38 % — ABNORMAL LOW (ref 39.0–52.0)
Hemoglobin: 12.2 g/dL — ABNORMAL LOW (ref 13.0–17.0)
MCH: 27.4 pg (ref 26.0–34.0)
MCHC: 32.1 g/dL (ref 30.0–36.0)
MCV: 85.2 fL (ref 78.0–100.0)
PLATELETS: 136 10*3/uL — AB (ref 150–400)
RBC: 4.46 MIL/uL (ref 4.22–5.81)
RDW: 15.1 % (ref 11.5–15.5)
WBC: 5.9 10*3/uL (ref 4.0–10.5)

## 2016-11-28 NOTE — Progress Notes (Signed)
EKG-07/09/16-epic  ECHO-08/03/16-epic  Cath-05/17/16-epic LOV- Cardiology- 08/03/16-epic  PFT- 06/06/16-epic

## 2016-11-28 NOTE — Progress Notes (Addendum)
Listed medications for patient to take am of surgery on preop instructions sheet if patient takes the medications listed in the am .  Daughter with patient at time of preop appointment and unsure if meds listed were am medications.  Daughter was to go home and to call me back at [phone] between 0800-400pm Monday thru Friday with the times patient takes medications so she could verify the meds at home and times so  that we could go over the list on preop and to note on [place] list the times patient takes medications to make sure correct what medications patient takes in the am.

## 2016-11-28 NOTE — Progress Notes (Signed)
Attempted to call [phone] to see if daughter available to review meds.  No answer.

## 2016-11-30 NOTE — Progress Notes (Signed)
Jackelyn Poling, daughter called with the times that patient takes medications so I could instruct on what medications to take am of surgery.  Corrected times on medications and instructed daughter, Jackelyn Poling for patient to take Imdur, synthroid, metoprolol, flomax and finasteride am of surgery with a sip of water.  Daughter, Jackelyn Poling voiced understanding.

## 2016-12-03 DIAGNOSIS — I25119 Atherosclerotic heart disease of native coronary artery with unspecified angina pectoris: Secondary | ICD-10-CM | POA: Diagnosis not present

## 2016-12-03 DIAGNOSIS — N400 Enlarged prostate without lower urinary tract symptoms: Secondary | ICD-10-CM | POA: Diagnosis not present

## 2016-12-03 DIAGNOSIS — I350 Nonrheumatic aortic (valve) stenosis: Secondary | ICD-10-CM | POA: Diagnosis not present

## 2016-12-03 DIAGNOSIS — R918 Other nonspecific abnormal finding of lung field: Secondary | ICD-10-CM | POA: Diagnosis not present

## 2016-12-04 ENCOUNTER — Other Ambulatory Visit: Payer: Self-pay | Admitting: Internal Medicine

## 2016-12-04 DIAGNOSIS — R911 Solitary pulmonary nodule: Secondary | ICD-10-CM

## 2016-12-04 NOTE — Progress Notes (Signed)
Clearance received and placed on front of chart.

## 2016-12-04 NOTE — Progress Notes (Signed)
Spoke with Dr Marcell Barlow, anesthesia , and made him aware of b/p reading at preop along with previous blood pressure readings at previous MD visits in [facility] and information Daughter, Jackelyn Poling had provided me with regarding medications at phone call of 11/30/2016 when patient takes medications and dosages and what I found for dosages at last cardiology visit of 08/03/2016.  I informed Dr Marcell Barlow that I had instructed daughter, Jackelyn Poling for patient  to take Imdur and Metoprolol along with Synthroid , flomax and finasteride as normally takes in am.  No new orders given per anesthesia.

## 2016-12-04 NOTE — Progress Notes (Signed)
Per telephone note of 10/18/16 clearance faxed to [facility].  Called Marlowe Kays Mabe and left voice mail message and requested clearance to be faxed to [phone].

## 2016-12-05 ENCOUNTER — Ambulatory Visit (HOSPITAL_COMMUNITY): Payer: Medicare Other | Admitting: Anesthesiology

## 2016-12-05 ENCOUNTER — Encounter (HOSPITAL_COMMUNITY)
Admission: RE | Disposition: A | Discharge status: Home / Self Care | Payer: Self-pay | Source: Ambulatory Visit | Attending: Urology

## 2016-12-05 ENCOUNTER — Encounter (HOSPITAL_COMMUNITY): Payer: Self-pay | Admitting: Anesthesiology

## 2016-12-05 ENCOUNTER — Ambulatory Visit (HOSPITAL_COMMUNITY)
Admission: RE | Admit: 2016-12-05 | Discharge: 2016-12-05 | Disposition: A | Discharge status: Home / Self Care | Payer: Medicare Other | Source: Ambulatory Visit | Attending: Urology | Admitting: Urology

## 2016-12-05 DIAGNOSIS — I252 Old myocardial infarction: Secondary | ICD-10-CM | POA: Diagnosis not present

## 2016-12-05 DIAGNOSIS — E785 Hyperlipidemia, unspecified: Secondary | ICD-10-CM | POA: Insufficient documentation

## 2016-12-05 DIAGNOSIS — I509 Heart failure, unspecified: Secondary | ICD-10-CM | POA: Diagnosis not present

## 2016-12-05 DIAGNOSIS — Z87891 Personal history of nicotine dependence: Secondary | ICD-10-CM | POA: Diagnosis not present

## 2016-12-05 DIAGNOSIS — Z951 Presence of aortocoronary bypass graft: Secondary | ICD-10-CM | POA: Insufficient documentation

## 2016-12-05 DIAGNOSIS — N3289 Other specified disorders of bladder: Secondary | ICD-10-CM | POA: Diagnosis not present

## 2016-12-05 DIAGNOSIS — E039 Hypothyroidism, unspecified: Secondary | ICD-10-CM | POA: Insufficient documentation

## 2016-12-05 DIAGNOSIS — I4891 Unspecified atrial fibrillation: Secondary | ICD-10-CM | POA: Insufficient documentation

## 2016-12-05 DIAGNOSIS — N401 Enlarged prostate with lower urinary tract symptoms: Secondary | ICD-10-CM | POA: Insufficient documentation

## 2016-12-05 DIAGNOSIS — R351 Nocturia: Secondary | ICD-10-CM | POA: Diagnosis not present

## 2016-12-05 DIAGNOSIS — I110 Hypertensive heart disease with heart failure: Secondary | ICD-10-CM | POA: Diagnosis not present

## 2016-12-05 DIAGNOSIS — Z8572 Personal history of non-Hodgkin lymphomas: Secondary | ICD-10-CM | POA: Insufficient documentation

## 2016-12-05 DIAGNOSIS — I4510 Unspecified right bundle-branch block: Secondary | ICD-10-CM | POA: Diagnosis not present

## 2016-12-05 DIAGNOSIS — Z79899 Other long term (current) drug therapy: Secondary | ICD-10-CM | POA: Diagnosis not present

## 2016-12-05 DIAGNOSIS — N400 Enlarged prostate without lower urinary tract symptoms: Secondary | ICD-10-CM | POA: Diagnosis not present

## 2016-12-05 DIAGNOSIS — Z7982 Long term (current) use of aspirin: Secondary | ICD-10-CM | POA: Diagnosis not present

## 2016-12-05 DIAGNOSIS — N138 Other obstructive and reflux uropathy: Secondary | ICD-10-CM | POA: Diagnosis not present

## 2016-12-05 DIAGNOSIS — Z955 Presence of coronary angioplasty implant and graft: Secondary | ICD-10-CM | POA: Diagnosis not present

## 2016-12-05 DIAGNOSIS — I2510 Atherosclerotic heart disease of native coronary artery without angina pectoris: Secondary | ICD-10-CM | POA: Diagnosis not present

## 2016-12-05 DIAGNOSIS — G629 Polyneuropathy, unspecified: Secondary | ICD-10-CM | POA: Insufficient documentation

## 2016-12-05 HISTORY — DX: Polyneuropathy, unspecified: G62.9

## 2016-12-05 HISTORY — DX: Benign prostatic hyperplasia without lower urinary tract symptoms: N40.0

## 2016-12-05 HISTORY — DX: Retention of urine, unspecified: R33.9

## 2016-12-05 HISTORY — DX: Mixed hyperlipidemia: E78.2

## 2016-12-05 HISTORY — PX: THULIUM LASER TURP (TRANSURETHRAL RESECTION OF PROSTATE): SHX6744

## 2016-12-05 HISTORY — DX: Presence of spectacles and contact lenses: Z97.3

## 2016-12-05 HISTORY — DX: Personal history of colonic polyps: Z86.010

## 2016-12-05 HISTORY — DX: Personal history of colon polyps, unspecified: Z86.0100

## 2016-12-05 HISTORY — DX: Old myocardial infarction: I25.2

## 2016-12-05 HISTORY — DX: Unspecified right bundle-branch block: I45.10

## 2016-12-05 HISTORY — DX: Diverticulosis of large intestine without perforation or abscess without bleeding: K57.30

## 2016-12-05 HISTORY — DX: Presence of coronary angioplasty implant and graft: Z95.5

## 2016-12-05 HISTORY — DX: Presence of dental prosthetic device (complete) (partial): Z97.2

## 2016-12-05 HISTORY — DX: Unspecified symptoms and signs involving the genitourinary system: R39.9

## 2016-12-05 HISTORY — DX: Presence of aortocoronary bypass graft: Z95.1

## 2016-12-05 HISTORY — DX: Unspecified osteoarthritis, unspecified site: M19.90

## 2016-12-05 SURGERY — THULIUM LASER TURP (TRANSURETHRAL RESECTION OF PROSTATE)
Anesthesia: General

## 2016-12-05 MED ORDER — HYDROCODONE-ACETAMINOPHEN 5-325 MG PO TABS: 1.0000 | ORAL_TABLET | Freq: Four times a day (QID) | ORAL | 0 refills | Status: DC | PRN

## 2016-12-05 MED ORDER — CEFAZOLIN SODIUM-DEXTROSE 2-4 GM/100ML-% IV SOLN
2.0000 g | INTRAVENOUS | Status: AC
  Administered 2016-12-05: 2 g via INTRAVENOUS
  Filled 2016-12-05 (×2): qty 100

## 2016-12-05 MED ORDER — DEXAMETHASONE SODIUM PHOSPHATE 10 MG/ML IJ SOLN
INTRAMUSCULAR | Status: AC
  Filled 2016-12-05: qty 1

## 2016-12-05 MED ORDER — EPHEDRINE 5 MG/ML INJ
INTRAVENOUS | Status: AC
  Filled 2016-12-05: qty 10

## 2016-12-05 MED ORDER — FENTANYL CITRATE (PF) 100 MCG/2ML IJ SOLN
INTRAMUSCULAR | Status: DC | PRN
  Administered 2016-12-05: 25 ug via INTRAVENOUS

## 2016-12-05 MED ORDER — FENTANYL CITRATE (PF) 100 MCG/2ML IJ SOLN
INTRAMUSCULAR | Status: AC
  Filled 2016-12-05: qty 4

## 2016-12-05 MED ORDER — PROPOFOL 10 MG/ML IV BOLUS
INTRAVENOUS | Status: AC
  Filled 2016-12-05: qty 40

## 2016-12-05 MED ORDER — 0.9 % SODIUM CHLORIDE (POUR BTL) OPTIME
TOPICAL | Status: DC | PRN
  Administered 2016-12-05: 1000 mL

## 2016-12-05 MED ORDER — ONDANSETRON HCL 4 MG/2ML IJ SOLN
INTRAMUSCULAR | Status: DC | PRN
  Administered 2016-12-05: 4 mg via INTRAVENOUS

## 2016-12-05 MED ORDER — LACTATED RINGERS IV SOLN: INTRAVENOUS | Status: DC

## 2016-12-05 MED ORDER — CEPHALEXIN 500 MG PO CAPS: 500.0000 mg | ORAL_CAPSULE | Freq: Three times a day (TID) | ORAL | 0 refills | Status: DC

## 2016-12-05 MED ORDER — LACTATED RINGERS IV SOLN
INTRAVENOUS | Status: DC
  Administered 2016-12-05: 10:00:00 via INTRAVENOUS

## 2016-12-05 MED ORDER — PROMETHAZINE HCL 25 MG/ML IJ SOLN: 6.2500 mg | INTRAMUSCULAR | Status: DC | PRN

## 2016-12-05 MED ORDER — ONDANSETRON HCL 4 MG/2ML IJ SOLN
INTRAMUSCULAR | Status: AC
  Filled 2016-12-05: qty 2

## 2016-12-05 MED ORDER — HYDROMORPHONE HCL 1 MG/ML IJ SOLN: 0.2500 mg | INTRAMUSCULAR | Status: DC | PRN

## 2016-12-05 MED ORDER — DEXAMETHASONE SODIUM PHOSPHATE 10 MG/ML IJ SOLN
INTRAMUSCULAR | Status: DC | PRN
  Administered 2016-12-05: 10 mg via INTRAVENOUS

## 2016-12-05 MED ORDER — PROPOFOL 500 MG/50ML IV EMUL
INTRAVENOUS | Status: DC | PRN
  Administered 2016-12-05: 100 mL via INTRAVENOUS

## 2016-12-05 MED ORDER — SODIUM CHLORIDE 0.9 % IR SOLN
Status: DC | PRN
  Administered 2016-12-05: 6000 mL via INTRAVESICAL

## 2016-12-05 MED ORDER — EPHEDRINE SULFATE 50 MG/ML IJ SOLN
INTRAMUSCULAR | Status: DC | PRN
  Administered 2016-12-05: 15 mg via INTRAVENOUS

## 2016-12-05 MED ORDER — LIDOCAINE 2% (20 MG/ML) 5 ML SYRINGE
INTRAMUSCULAR | Status: DC | PRN
  Administered 2016-12-05: 60 mg via INTRAVENOUS

## 2016-12-05 MED ORDER — KETOROLAC TROMETHAMINE 30 MG/ML IJ SOLN
INTRAMUSCULAR | Status: AC
  Filled 2016-12-05: qty 1

## 2016-12-05 SURGICAL SUPPLY — 17 items
BAG URINE DRAINAGE (UROLOGICAL SUPPLIES) ×3 IMPLANT
BAG URO CATCHER STRL LF (MISCELLANEOUS) ×3 IMPLANT
CATH FOLEY 2WAY SLVR 30CC 22FR (CATHETERS) ×3 IMPLANT
CATH HEMA 3WAY 30CC 22FR COUDE (CATHETERS) IMPLANT
ELECT REM PT RETURN 9FT ADLT (ELECTROSURGICAL)
ELECTRODE REM PT RTRN 9FT ADLT (ELECTROSURGICAL) IMPLANT
GLOVE BIO SURGEON STRL SZ7.5 (GLOVE) ×3 IMPLANT
GOWN STRL REUS W/TWL LRG LVL3 (GOWN DISPOSABLE) ×3 IMPLANT
GOWN STRL REUS W/TWL XL LVL3 (GOWN DISPOSABLE) ×3 IMPLANT
HOLDER FOLEY CATH W/STRAP (MISCELLANEOUS) IMPLANT
LASER REVOLIX PROCEDURE (MISCELLANEOUS) ×3 IMPLANT
LOOP CUT BIPOLAR 24F LRG (ELECTROSURGICAL) IMPLANT
MANIFOLD NEPTUNE II (INSTRUMENTS) ×3 IMPLANT
PACK CYSTO (CUSTOM PROCEDURE TRAY) ×3 IMPLANT
SYRINGE IRR TOOMEY STRL 70CC (SYRINGE) IMPLANT
TUBING CONNECTING 10 (TUBING) ×2 IMPLANT
TUBING CONNECTING 10' (TUBING) ×1

## 2016-12-05 NOTE — Anesthesia Preprocedure Evaluation (Addendum)
Anesthesia Evaluation  Patient identified by MRN, date of birth, ID band Patient awake    Reviewed: Allergy & Precautions, NPO status , Patient's Chart, lab work & pertinent test results, reviewed documented beta blocker date and time   Airway Mallampati: II  TM Distance: >3 FB Neck ROM: Full    Dental no notable dental hx. (+) Edentulous Upper, Edentulous Lower   Pulmonary shortness of breath and with exertion, former smoker,  Hx/o Pulmonary nodules   Pulmonary exam normal breath sounds clear to auscultation       Cardiovascular Exercise Tolerance: Poor hypertension, Pt. on medications and Pt. 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 AAA 3/3x3.0cm 05/2016 RBBB LVEF 40-45%   Neuro/Psych Peripheral neuropathy  Neuromuscular disease negative psych ROS   GI/Hepatic Neg liver ROS, Hx/o Colon polyps   Endo/Other  Hypothyroidism Hyperlipidemia  Renal/GU negative Renal ROS   BPH    Musculoskeletal  (+) Arthritis , Osteoarthritis,    Abdominal (+) + obese,   Peds  Hematology  (+) anemia , Thrombocytopenia Plavix use- last dose Hx/o Non Hodgkin's lymphoma   Anesthesia Other Findings   Reproductive/Obstetrics                           Anesthesia Physical Anesthesia Plan  ASA: III  Anesthesia Plan: General   Post-op Pain Management:    Induction: Intravenous  Airway Management Planned: Oral ETT  Additional Equipment:   Intra-op Plan:   Post-operative Plan: Extubation in OR  Informed Consent: I have reviewed the patients History and Physical, chart, labs and discussed the procedure including the risks, benefits and alternatives for the proposed  anesthesia with the patient or authorized representative who has indicated his/her understanding and acceptance.   Dental advisory given  Plan Discussed with: Anesthesiologist, CRNA and Surgeon  Anesthesia Plan Comments:         Anesthesia Quick Evaluation

## 2016-12-05 NOTE — Discharge Instructions (Signed)
Drink plenty of fluids, call your doctor for fever, chills, catheter not draining, pain not relieved by medications, any problems or questions. No change in diet or medications, or activity. May shower tomorrow.     Indwelling Urinary Catheter Care, Adult Take good care of your catheter to keep it working and to prevent problems. How to wear your catheter Attach your catheter to your leg with tape (adhesive tape) or a leg strap. Make sure it is not too tight. If you use tape, remove any bits of tape that are already on the catheter. How to wear a drainage bag You should have:  A large overnight bag.  A small leg bag. Overnight Bag  You may wear the overnight bag at any time. Always keep the bag below the level of your bladder but off the floor. When you sleep, put a clean plastic bag in a wastebasket. Then hang the bag inside the wastebasket. Leg Bag  Never wear the leg bag at night. Always wear the leg bag below your knee. Keep the leg bag secure with a leg strap or tape. How to care for your skin  Clean the skin around the catheter at least once every day.  Shower every day. Do not take baths.  Put creams, lotions, or ointments on your genital area only as told by your doctor.  Do not use powders, sprays, or lotions on your genital area. How to clean your catheter and your skin 1. Wash your hands with soap and water. 2. Wet a washcloth in warm water and gentle (mild) soap. 3. Use the washcloth to clean the skin where the catheter enters your body. Clean downward and wipe away from the catheter in small circles. Do not wipe toward the catheter. 4. Pat the area dry with a clean towel. Make sure to clean off all soap. How to care for your drainage bags Empty your drainage bag when it is ?- full or at least 2-3 times a day. Replace your drainage bag once a month or sooner if it starts to smell bad or look dirty. Do not clean your drainage bag unless told by your doctor. Emptying a  drainage bag   Supplies Needed  Rubbing alcohol.  Gauze pad or cotton ball.  Tape or a leg strap. Steps 1. Wash your hands with soap and water. 2. Separate (detach) the bag from your leg. 3. Hold the bag over the toilet or a clean container. Keep the bag below your hips and bladder. This stops pee (urine) from going back into the tube. 4. Open the pour spout at the bottom of the bag. 5. Empty the pee into the toilet or container. Do not let the pour spout touch any surface. 6. Put rubbing alcohol on a gauze pad or cotton ball. 7. Use the gauze pad or cotton ball to clean the pour spout. 8. Close the pour spout. 9. Attach the bag to your leg with tape or a leg strap. 10. Wash your hands. Changing a drainage bag  Supplies Needed  Alcohol wipes.  A clean drainage bag.  Adhesive tape or a leg strap. Steps 1. Wash your hands with soap and water. 2. Separate the dirty bag from your leg. 3. Pinch the rubber catheter with your fingers so that pee does not spill out. 4. Separate the catheter tube from the drainage tube where these tubes connect (at the connection valve). Do not let the tubes touch any surface. 5. Clean the end of the catheter  tube with an alcohol wipe. Use a different alcohol wipe to clean the end of the drainage tube. 6. Connect the catheter tube to the drainage tube of the clean bag. 7. Attach the new bag to the leg with adhesive tape or a leg strap. 8. Wash your hands. How to prevent infection and other problems  Never pull on your catheter or try to remove it. Pulling can damage tissue in your body.  Always wash your hands before and after touching your catheter.  If a leg strap gets wet, replace it with a dry one.  Drink enough fluids to keep your pee clear or pale yellow, or as told by your doctor.  Do not let the drainage bag or tubing touch the floor.  Wear cotton underwear.  If you are male, wipe from front to back after you poop (have a bowel  movement).  Check on the catheter often to make sure it works and the tubing is not twisted. Get help if:  Your pee is cloudy.  Your pee smells unusually bad.  Your pee is not draining into the bag.  Your tube gets clogged.  Your catheter starts to leak.  Your bladder feels full. Get help right away if:  You have redness, swelling, or pain where the catheter enters your body.  You have fluid, pus, or a bad smell coming from the area where the catheter enters your body.  The area where the catheter enters your body feels warm.  You have a fever.  You have pain in your:  Stomach (abdomen).  Legs.  Lower back.  Bladder.  You see blood fill the catheter.  Your pee is pink or red.  You feel sick to your stomach (nauseous).  You throw up (vomit).  You have chills.  Your catheter gets pulled out. This information is not intended to replace advice given to you by your health care provider. Make sure you discuss any questions you have with your health care provider. Document Released: 01/19/2013 Document Revised: 08/22/2016 Document Reviewed: 03/09/2014 Elsevier Interactive Patient Education  2017 [place] Anesthesia, Adult, Care After These instructions provide you with information about caring for yourself after your procedure. Your health care provider may also give you more specific instructions. Your treatment has been planned according to current medical practices, but problems sometimes occur. Call your health care provider if you have any problems or questions after your procedure. What can I expect after the procedure? After the procedure, it is common to have:  Vomiting.  A sore throat.  Mental slowness. It is common to feel:  Nauseous.  Cold or shivery.  Sleepy.  Tired.  Sore or achy, even in parts of your body where you did not have surgery. Follow these instructions at home: For at least 24 hours after the procedure:   Do  not:  Participate in activities where you could fall or become injured.  Drive.  Use heavy machinery.  Drink alcohol.  Take sleeping pills or medicines that cause drowsiness.  Make important decisions or sign legal documents.  Take care of children on your own.  Rest. Eating and drinking   If you vomit, drink water, juice, or soup when you can drink without vomiting.  Drink enough fluid to keep your urine clear or pale yellow.  Make sure you have little or no nausea before eating solid foods.  Follow the diet recommended by your health care provider. General instructions   Have a responsible adult stay  with you until you are awake and alert.  Return to your normal activities as told by your health care provider. Ask your health care provider what activities are safe for you.  Take over-the-counter and prescription medicines only as told by your health care provider.  If you smoke, do not smoke without supervision.  Keep all follow-up visits as told by your health care provider. This is important. Contact a health care provider if:  You continue to have nausea or vomiting at home, and medicines are not helpful.  You cannot drink fluids or start eating again.  You cannot urinate after 8-12 hours.  You develop a skin rash.  You have fever.  You have increasing redness at the site of your procedure. Get help right away if:  You have difficulty breathing.  You have chest pain.  You have unexpected bleeding.  You feel that you are having a life-threatening or urgent problem. This information is not intended to replace advice given to you by your health care provider. Make sure you discuss any questions you have with your health care provider. Document Released: 12/31/2000 Document Revised: 02/27/2016 Document Reviewed: 09/08/2015 Elsevier Interactive Patient Education  2017 [employer].

## 2016-12-05 NOTE — H&P (Signed)
CC: BPH  HPI: Tim Campbell is a 83 year-old male established patient who is here for follow up regarding further evaluation of BPH and lower urinary tract symptoms.  His symptoms have been worse over the last year. The patient complains of lower urinary tract symptom(s) that include urgency and nocturia. The patient states his most bothersome symptom(s) are the following: urgency.   history of urinary retention.   He was having significant urge and urge incontinence. Finasteride and flomax added only minimal improvement. He was frustrated with his nocturia times 4. He also has significant frequency.   He was started on Myrbetriq 25 mg at his last visit with the plan to monitor him closely with PVRs due to his history of retention. PVR was 41 cc at time of starting Myrbetriq. He stopped the medication for a few days as it mad it difficult for him to urinate.   He has found an herbal supplement for an enlarged prostate that he would like to try at this time before discussing any further therapy. We discussed UDS and office cystoscopy at his last visit, but he wanted to try his herbal supplement first.   He presents today after developing urinary retention (800 cc) on September 06, 2016. He had stopped taking his finasteride and decreased flomax to once per day. This was restarted at this last visit.   Since his last visit, he underwent urodynamic studies:  The patient had a maximum flow rate of only 4 cc/s the detrusor pressure maximum flow of 47 cm of water. He had a maximum capacity of 261 cc. Expressed per sensation 247 cc. His detrusor pressures were high with an obstructed flow pattern. Trabeculation and elevated lipase were noted. During the test he had a lot of difficulty getting started.     ALLERGIES: No Allergies    MEDICATIONS: Finasteride 5 mg tablet TAKE ONE TABLET BY MOUTH DAILY  Flomax 0.4 mg capsule, ext release 24 hr 1 capsule PO BID  Macrobid 100 mg capsule 1 capsule PO BID   Proscar 5 mg tablet 1 tablet PO Daily  Aspirin 81 MG TABS Oral  Atorvastatin Calcium 40 MG Oral Tablet Oral  Calcium TABS Oral  Chlorella CAPS Oral  Chlorophyll TABS Oral  Clopidogrel 75 mg tablet  Co Q 10 CAPS Oral  Finasteride 5 MG Oral Tablet 0 Oral Daily  Folic Acid TABS Oral  Garlic CAPS Oral  HydroCHLOROthiazide 12.5 MG Oral Tablet Oral  Isosorbide Mononitrate ER 60 MG Oral Tablet Extended Release 24 Hour 0 Oral  Levothyroxine Sodium 125 MCG Oral Tablet Oral  Magnesium 200 mg tablet  Metoprolol Tartrate 25 MG Oral Tablet Oral  Nitroglycerin 0.4 MG Sublingual Tablet Sublingual Sublingual  Tamsulosin HCl - 0.4 MG Oral Capsule 0 Oral  Vitamin B Complex CAPS Oral  Vitamin C TABS Oral  Vitamin E  Zinc 50 mg tablet     GU PSH: Catheterization For Collection Of Specimen, Single Patient, All Places Of Service - 10/09/2016 Complex cystometrogram, w/ void pressure and urethral pressure profile studies, any technique - 10/15/2016 Complex Uroflow - 10/15/2016 Emg surf Electrd - 10/15/2016 Inject For cystogram - 10/15/2016 Intrabd voidng Press - 10/15/2016      PSH Notes: Cath Stent Placement, Hernia Repair, Coronary Artery Bypass Graft (CABG)   NON-GU PSH: Coronary Artery Bypass Grafting (cabg) - 2008 Hernia Repair - 2008 Repair Wrist Joint(s)    GU PMH: BPH w/LUTS - 09/10/2016, - 08/06/2016, - 07/10/2016, Benign prostatic hyperplasia with urinary obstruction, - 11/02/2015  Urinary Retention (Stable) - 09/10/2016, Acute urinary retention, - 11/02/2015 Urge incontinence - 08/06/2016 Overactive bladder - 07/10/2016 BPH w/o LUTS - 05/01/2016 Urinary Urgency - 05/01/2016 Urinary Retention, Unspec, Urinary retention - 06/10/2015 Elevated PSA, Elevated prostate specific antigen (PSA) - 2014 Nocturia, Nocturia - 2014      PMH Notes:  2007-02-14 08:29:15 - Note: Acute Lymphoma   NON-GU PMH: Encounter for general adult medical examination without abnormal findings, Encounter for preventive health  examination - 11/02/2015 Atherosclerotic heart disease of native coronary artery without angina pectoris, CAD (coronary artery disease) - 06/10/2015 Personal history of other diseases of the circulatory system, History of cardiac murmur - 06/10/2015, History of cardiac disorder, - 06/10/2015, History of heart failure, - 06/10/2015, History of hypertension, - 06/10/2015 Personal history of other endocrine, nutritional and metabolic disease, History of hypothyroidism - 06/10/2015    FAMILY HISTORY: Bone Cancer - Runs In Family cerebrovascular accident (CVA) - Runs In Family Family Health Status Number - Runs In Family Parkinson's Disease - Runs In Family   SOCIAL HISTORY: Marital Status: Married Current Smoking Status: Patient does not smoke anymore. Has not smoked since 04/07/1976.  Has never drank.  Drinks 2 caffeinated drinks per day.     Notes: Former smoker, Three children, Father deceased, Retired, Mother deceased, Alcohol Use, Occupation:, Caffeine Use, Marital History - Currently Married, Tobacco Use   REVIEW OF SYSTEMS:    GU Review Male:   Patient denies frequent urination, hard to postpone urination, burning/ pain with urination, get up at night to urinate, leakage of urine, stream starts and stops, trouble starting your stream, have to strain to urinate , erection problems, and penile pain.  Gastrointestinal (Upper):   Patient denies nausea, vomiting, and indigestion/ heartburn.  Gastrointestinal (Lower):   Patient denies diarrhea and constipation.  Constitutional:   Patient denies fever, night sweats, weight loss, and fatigue.  Skin:   Patient denies skin rash/ lesion and itching.  Eyes:   Patient denies blurred vision and double vision.  Ears/ Nose/ Throat:   Patient denies sore throat and sinus problems.  Hematologic/Lymphatic:   Patient denies swollen glands and easy bruising.  Cardiovascular:   Patient denies leg swelling and chest pains.  Respiratory:   Patient denies cough and shortness  of breath.  Endocrine:   Patient denies excessive thirst.  Musculoskeletal:   Patient denies back pain and joint pain.  Neurological:   Patient denies headaches and dizziness.  Psychologic:   Patient denies depression and anxiety.   VITAL SIGNS:      10/16/2016 04:01 PM  Weight 220 lb / 99.79 kg  Height 70 in / 177.8 cm  BP 102/54 mmHg  Pulse 56 /min  Temperature 97.9 F / 37 C  BMI 31.6 kg/m   MULTI-SYSTEM PHYSICAL EXAMINATION:    Constitutional: Well-nourished. No physical deformities. Normally developed. Good grooming.  Neck: Neck symmetrical, not swollen. Normal tracheal position.  Respiratory: No labored breathing, no use of accessory muscles.   Cardiovascular: Normal temperature, normal extremity pulses, no swelling, no varicosities.  Skin: No paleness, no jaundice, no cyanosis. No lesion, no ulcer, no rash.  Gastrointestinal: No mass, no tenderness, no rigidity, non obese abdomen.  Eyes: Normal conjunctivae. Normal eyelids.  Ears, Nose, Mouth, and Throat: Left ear no scars, no lesions, no masses. Right ear no scars, no lesions, no masses. Nose no scars, no lesions, no masses. Normal hearing. Normal lips.  Musculoskeletal: Normal gait and station of head and neck.     PAST DATA  REVIEWED:  Source Of History:  Patient  Records Review:   Previous Patient Records  Urodynamics Review:   Review Urodynamics Tests   08/22/07 02/17/07  PSA  Total PSA 6.32  4.07   Free PSA 0.87  0.67   % Free PSA 13.8  16.5     PROCEDURES:         Flexible Cystoscopy - 52000  Risks, benefits, and some of the potential complications of the procedure were discussed at length with the patient including infection, bleeding, voiding discomfort, urinary retention, fever, chills, sepsis, and others. All questions were answered. Informed consent was obtained. Antibiotic prophylaxis was given. Sterile technique and intraurethral analgesia were used.  Meatus:  Normal size. Normal location. Normal  condition.  Urethra:  No strictures.  External Sphincter:  Normal.  Verumontanum:  Normal.  Prostate:  Obstructing. Severe hyperplasia. 6 cm prostatic urethra. visually obstructed.  Bladder Neck:  Non-obstructing.  Ureteral Orifices:  Normal location. Normal size. Normal shape. Effluxed clear urine.  Bladder:  No trabeculation. No tumors. Normal mucosa. No stones.      The lower urinary tract was carefully examined. The procedure was well-tolerated and without complications. Antibiotic instructions were given. Instructions were given to call the office immediately for bloody urine, difficulty urinating, urinary retention, painful or frequent urination, fever, chills, nausea, vomiting or other illness. The patient stated that he understood these instructions and would comply with them.         Urinalysis Dipstick Dipstick Cont'd  Color: Yellow Bilirubin: Neg  Appearance: Clear Ketones: Neg  Specific Gravity: 1.020 Blood: Neg  pH: 5.5 Protein: Neg  Glucose: Neg Urobilinogen: 0.2    Nitrites: Neg    Leukocyte Esterase: Neg    ASSESSMENT:      ICD-10 Details  1 GU:   BPH w/LUTS - N40.1   2   Nocturia - R35.1 Stable   PLAN:           Orders Labs Urine Culture and Sensitivity          Document Letter(s):  Created for Patient: Clinical Summary   Created for R Marcellus Scott, MD         Notes:   I discussed the patient's family treatment options as he has failed medical management of BPH. Options include TURP versus thullium laser photo vaporization. We discussed the risks and benefits of both operations. He has elected to go with the Foley vaporization as this will not require hospital admission. Since the risks, benefits, indications of procedure. He understands risks include but are not limited to bleeding, infection, prolonged hospitalization, and iatrogenic injury. All questions answered. He is elected to proceed.

## 2016-12-05 NOTE — Anesthesia Postprocedure Evaluation (Signed)
Anesthesia Post Note  Patient: Tim Campbell  Procedure(s) Performed: Procedure(s) (LRB): THULIUM LASER TURP (TRANSURETHRAL RESECTION OF PROSTATE) (N/A)  Patient location during evaluation: PACU Anesthesia Type: General Level of consciousness: awake and alert and oriented Pain management: pain level controlled Vital Signs Assessment: post-procedure vital signs reviewed and stable Respiratory status: spontaneous breathing, nonlabored ventilation and respiratory function stable Cardiovascular status: blood pressure returned to baseline and stable Postop Assessment: no signs of nausea or vomiting Anesthetic complications: no       Last Vitals:  Vitals:   12/05/16 1300 12/05/16 1309  BP: 139/82 (!) 140/55  Pulse: (!) 58   Resp: 20   Temp:  36.4 C    Last Pain:  Vitals:   12/05/16 1309  TempSrc:   PainSc: 0-No pain                 [name] A.

## 2016-12-05 NOTE — Anesthesia Preprocedure Evaluation (Deleted)
Anesthesia Evaluation  Patient identified by MRN, date of birth, ID band Patient awake    Reviewed: Allergy & Precautions, NPO status , Patient's Chart, lab work & pertinent test results, reviewed documented beta blocker date and time   Airway Mallampati: II  TM Distance: >3 FB Neck ROM: Full    Dental no notable dental hx. (+) Edentulous Upper   Pulmonary former smoker,    Pulmonary exam normal breath sounds clear to auscultation       Cardiovascular hypertension, Pt. on medications and Pt. on home beta blockers + CAD, + Past MI, + Cardiac Stents, + CABG and + Peripheral Vascular Disease  + dysrhythmias  Rhythm:Regular Rate:Normal  S/P TAVR   Neuro/Psych Peripheral neuropathy  Neuromuscular disease negative psych ROS   GI/Hepatic negative GI ROS, Neg liver ROS,   Endo/Other  Hypothyroidism Hyperlipidemia  Renal/GU negative Renal ROS  negative genitourinary   Musculoskeletal  (+) Arthritis , Osteoarthritis,    Abdominal Normal abdominal exam  (+) + obese,   Peds  Hematology Thrombocytopenia Plavix use- last dose   Anesthesia Other Findings   Reproductive/Obstetrics                            Anesthesia Physical Anesthesia Plan  ASA: III  Anesthesia Plan: General   Post-op Pain Management:    Induction: Intravenous  Airway Management Planned: Oral ETT  Additional Equipment:   Intra-op Plan:   Post-operative Plan: Extubation in OR  Informed Consent: I have reviewed the patients History and Physical, chart, labs and discussed the procedure including the risks, benefits and alternatives for the proposed anesthesia with the patient or authorized representative who has indicated his/her understanding and acceptance.   Dental advisory given  Plan Discussed with: Anesthesiologist  Anesthesia Plan Comments:         Anesthesia Quick Evaluation                                    Anesthesia Evaluation  Patient identified by MRN, date of birth, ID band Patient awake    Reviewed: Allergy & Precautions, NPO status , Patient's Chart, lab work & pertinent test results  Airway Mallampati: II  TM Distance: >3 FB Neck ROM: Full    Dental  (+) Edentulous Upper   Pulmonary former smoker,    breath sounds clear to auscultation       Cardiovascular hypertension,  Rhythm:Regular Rate:Normal     Neuro/Psych    GI/Hepatic   Endo/Other    Renal/GU      Musculoskeletal   Abdominal   Peds  Hematology   Anesthesia Other Findings   Reproductive/Obstetrics                            Anesthesia Physical Anesthesia Plan  ASA: III  Anesthesia Plan: General   Post-op Pain Management:    Induction: Intravenous  Airway Management Planned: Oral ETT  Additional Equipment: Arterial line and CVP  Intra-op Plan:   Post-operative Plan: Extubation in OR  Informed Consent: I have reviewed the patients History and Physical, chart, labs and discussed the procedure including the risks, benefits and alternatives for the proposed anesthesia with the patient or authorized representative who has indicated his/her understanding and acceptance.     Plan Discussed with: CRNA and Anesthesiologist  Anesthesia Plan Comments:  Anesthesia Quick Evaluation                                   Anesthesia Evaluation  Patient identified by MRN, date of birth, ID band Patient awake    Reviewed: Allergy & Precautions, NPO status , Patient's Chart, lab work & pertinent test results  Airway Mallampati: II  TM Distance: >3 FB Neck ROM: Full    Dental  (+) Edentulous Upper   Pulmonary former smoker,    breath sounds clear to auscultation       Cardiovascular hypertension,  Rhythm:Regular Rate:Normal     Neuro/Psych    GI/Hepatic   Endo/Other    Renal/GU      Musculoskeletal   Abdominal    Peds  Hematology   Anesthesia Other Findings   Reproductive/Obstetrics                            Anesthesia Physical Anesthesia Plan  ASA: III  Anesthesia Plan: General   Post-op Pain Management:    Induction: Intravenous  Airway Management Planned: Oral ETT  Additional Equipment: Arterial line and CVP  Intra-op Plan:   Post-operative Plan: Extubation in OR  Informed Consent: I have reviewed the patients History and Physical, chart, labs and discussed the procedure including the risks, benefits and alternatives for the proposed anesthesia with the patient or authorized representative who has indicated his/her understanding and acceptance.     Plan Discussed with: CRNA and Anesthesiologist  Anesthesia Plan Comments:         Anesthesia Quick Evaluation                                   Anesthesia Evaluation  Patient identified by MRN, date of birth, ID band Patient awake    Reviewed: Allergy & Precautions, NPO status , Patient's Chart, lab work & pertinent test results  Airway Mallampati: II  TM Distance: >3 FB Neck ROM: Full    Dental  (+) Edentulous Upper   Pulmonary former smoker,    breath sounds clear to auscultation       Cardiovascular hypertension,  Rhythm:Regular Rate:Normal     Neuro/Psych    GI/Hepatic   Endo/Other    Renal/GU      Musculoskeletal   Abdominal   Peds  Hematology   Anesthesia Other Findings   Reproductive/Obstetrics                            Anesthesia Physical Anesthesia Plan  ASA: III  Anesthesia Plan: General   Post-op Pain Management:    Induction: Intravenous  Airway Management Planned: Oral ETT  Additional Equipment: Arterial line and CVP  Intra-op Plan:   Post-operative Plan: Extubation in OR  Informed Consent: I have reviewed the patients History and Physical, chart, labs and discussed the procedure including the risks,  benefits and alternatives for the proposed anesthesia with the patient or authorized representative who has indicated his/her understanding and acceptance.     Plan Discussed with: CRNA and Anesthesiologist  Anesthesia Plan Comments:         Anesthesia Quick Evaluation

## 2016-12-05 NOTE — Transfer of Care (Signed)
Immediate Anesthesia Transfer of Care Note  Patient: Tim Campbell  Procedure(s) Performed: Procedure(s): THULIUM LASER TURP (TRANSURETHRAL RESECTION OF PROSTATE) (N/A)  Patient Location: PACU  Anesthesia Type:General  Level of Consciousness: awake, alert , oriented and patient cooperative  Airway & Oxygen Therapy: Patient Spontanous Breathing and Patient connected to face mask oxygen  Post-op Assessment: Report given to RN and Post -op Vital signs reviewed and stable  Post vital signs: Reviewed and stable  Last Vitals:  Vitals:   12/05/16 0934 12/05/16 1239  BP: (!) 104/50 (!) (P) 143/53  Pulse: (!) 54 (!) (P) 59  Resp: 18 (P) 19  Temp: 36.6 C (P) 36.7 C    Last Pain:  Vitals:   12/05/16 0934  TempSrc: Oral      Patients Stated Pain Goal: 4 ([id number])  Complications: No apparent anesthesia complications

## 2016-12-05 NOTE — Anesthesia Procedure Notes (Signed)
Procedure Name: LMA Insertion Date/Time: 12/05/2016 11:35 AM Performed by: Wanita Chamberlain Pre-anesthesia Checklist: Patient identified, Emergency Drugs available, Suction available, Patient being monitored and Timeout performed Patient Re-evaluated:Patient Re-evaluated prior to inductionOxygen Delivery Method: Circle system utilized Preoxygenation: Pre-oxygenation with 100% oxygen Intubation Type: IV induction Ventilation: Mask ventilation without difficulty LMA: LMA inserted LMA Size: 5.0 Number of attempts: 1 Placement Confirmation: positive ETCO2 and breath sounds checked- equal and bilateral Tube secured with: Tape Dental Injury: Teeth and Oropharynx as per pre-operative assessment

## 2016-12-05 NOTE — Op Note (Signed)
Date of procedure: 12/05/16  Preoperative diagnosis:  1. BPH   Postoperative diagnosis:  1. Same   Procedure: 1. Thulium laser ablation of prostate  Surgeon: Baruch Gouty, MD  Anesthesia: General  Complications: None  Intraoperative findings: The patient had a visually obstructive prostate that was visually unobstructed at the end of the procedure.  EBL: None  Specimens: None  Drains: 22 French Foley catheter dependent drainage  Disposition: Stable to the postanesthesia care unit  Indication for procedure: The patient is a 83 y.o. male with history of BPH who has failed medical management and is extremely bothered by his symptoms presents today for management via Azor ablation of prostate.  After reviewing the management options for treatment, the patient elected to proceed with the above surgical procedure(s). We have discussed the potential benefits and risks of the procedure, side effects of the proposed treatment, the likelihood of the patient achieving the goals of the procedure, and any potential problems that might occur during the procedure or recuperation. Informed consent has been obtained.  Description of procedure: The patient was met in the preoperative area. All risks, benefits, and indications of the procedure were described in great detail. The patient consented to the procedure. Preoperative antibiotics were given. The patient was taken to the operative theater. General anesthesia was induced per the anesthesia service. The patient was then placed in the dorsal lithotomy position and prepped and draped in the usual sterile fashion. A preoperative timeout was called.   A 21 French laser ablation cystoscope was inserted into the patient's bladder per urethra atraumatically. The verumontanum and ureteral orifices were identified. The patient did have a small median lobe. In a stepwise fashion, his prostate was ablated 360 starting at the median lobe and working more  proximally. The median and lateral lobes were ablated 360. This process took approximately 45 minutes. At this point he was visually unobstructed. His verumontanum and bilateral ureteral orifices were intact at the end the procedure. At this point, given the patient's was visually unobstructed and his age but the procedure was ended. 59 French Foley catheter was placed for uterine of clear urine.  Plan: The patient will follow-up in 2 days for catheter removal with my nursing staff. He'll see me in one month to assess his progress. He will continue his Flomax and finasteride until that time. If he is doing well we will consider stepwise discontinuation of this medication.  Baruch Gouty, M.D.

## 2016-12-05 NOTE — Progress Notes (Signed)
Instructed family and patient on care and how to empty leg drainage bag. Reviewed all home care instructions.

## 2016-12-06 ENCOUNTER — Encounter (HOSPITAL_COMMUNITY): Payer: Self-pay | Admitting: Urology

## 2016-12-07 ENCOUNTER — Other Ambulatory Visit: Payer: Medicare Other

## 2016-12-16 ENCOUNTER — Inpatient Hospital Stay (HOSPITAL_BASED_OUTPATIENT_CLINIC_OR_DEPARTMENT_OTHER)
Admission: EM | Admit: 2016-12-16 | Discharge: 2016-12-19 | DRG: 690 | Disposition: A | Discharge status: Home / Self Care | Payer: Medicare Other | Attending: Internal Medicine | Admitting: Internal Medicine

## 2016-12-16 ENCOUNTER — Encounter (HOSPITAL_BASED_OUTPATIENT_CLINIC_OR_DEPARTMENT_OTHER): Payer: Self-pay | Admitting: Emergency Medicine

## 2016-12-16 DIAGNOSIS — Z952 Presence of prosthetic heart valve: Secondary | ICD-10-CM

## 2016-12-16 DIAGNOSIS — I25119 Atherosclerotic heart disease of native coronary artery with unspecified angina pectoris: Secondary | ICD-10-CM | POA: Diagnosis present

## 2016-12-16 DIAGNOSIS — R338 Other retention of urine: Secondary | ICD-10-CM | POA: Diagnosis present

## 2016-12-16 DIAGNOSIS — Z9079 Acquired absence of other genital organ(s): Secondary | ICD-10-CM

## 2016-12-16 DIAGNOSIS — Z8572 Personal history of non-Hodgkin lymphomas: Secondary | ICD-10-CM

## 2016-12-16 DIAGNOSIS — N401 Enlarged prostate with lower urinary tract symptoms: Secondary | ICD-10-CM | POA: Diagnosis present

## 2016-12-16 DIAGNOSIS — N179 Acute kidney failure, unspecified: Secondary | ICD-10-CM | POA: Diagnosis present

## 2016-12-16 DIAGNOSIS — R339 Retention of urine, unspecified: Secondary | ICD-10-CM | POA: Diagnosis not present

## 2016-12-16 DIAGNOSIS — Z9842 Cataract extraction status, left eye: Secondary | ICD-10-CM

## 2016-12-16 DIAGNOSIS — I5042 Chronic combined systolic (congestive) and diastolic (congestive) heart failure: Secondary | ICD-10-CM | POA: Diagnosis not present

## 2016-12-16 DIAGNOSIS — D696 Thrombocytopenia, unspecified: Secondary | ICD-10-CM | POA: Diagnosis not present

## 2016-12-16 DIAGNOSIS — Z86010 Personal history of colonic polyps: Secondary | ICD-10-CM

## 2016-12-16 DIAGNOSIS — Z808 Family history of malignant neoplasm of other organs or systems: Secondary | ICD-10-CM

## 2016-12-16 DIAGNOSIS — Z923 Personal history of irradiation: Secondary | ICD-10-CM

## 2016-12-16 DIAGNOSIS — R310 Gross hematuria: Secondary | ICD-10-CM | POA: Diagnosis not present

## 2016-12-16 DIAGNOSIS — N183 Chronic kidney disease, stage 3 (moderate): Secondary | ICD-10-CM | POA: Diagnosis present

## 2016-12-16 DIAGNOSIS — Z961 Presence of intraocular lens: Secondary | ICD-10-CM | POA: Diagnosis present

## 2016-12-16 DIAGNOSIS — Z9841 Cataract extraction status, right eye: Secondary | ICD-10-CM

## 2016-12-16 DIAGNOSIS — I25810 Atherosclerosis of coronary artery bypass graft(s) without angina pectoris: Secondary | ICD-10-CM | POA: Diagnosis present

## 2016-12-16 DIAGNOSIS — I130 Hypertensive heart and chronic kidney disease with heart failure and stage 1 through stage 4 chronic kidney disease, or unspecified chronic kidney disease: Secondary | ICD-10-CM | POA: Diagnosis not present

## 2016-12-16 DIAGNOSIS — M1990 Unspecified osteoarthritis, unspecified site: Secondary | ICD-10-CM | POA: Diagnosis present

## 2016-12-16 DIAGNOSIS — Z9221 Personal history of antineoplastic chemotherapy: Secondary | ICD-10-CM

## 2016-12-16 DIAGNOSIS — I2510 Atherosclerotic heart disease of native coronary artery without angina pectoris: Secondary | ICD-10-CM | POA: Diagnosis present

## 2016-12-16 DIAGNOSIS — E785 Hyperlipidemia, unspecified: Secondary | ICD-10-CM | POA: Diagnosis present

## 2016-12-16 DIAGNOSIS — Z800 Family history of malignant neoplasm of digestive organs: Secondary | ICD-10-CM

## 2016-12-16 DIAGNOSIS — I5043 Acute on chronic combined systolic (congestive) and diastolic (congestive) heart failure: Secondary | ICD-10-CM | POA: Diagnosis present

## 2016-12-16 DIAGNOSIS — Z823 Family history of stroke: Secondary | ICD-10-CM

## 2016-12-16 DIAGNOSIS — Z7902 Long term (current) use of antithrombotics/antiplatelets: Secondary | ICD-10-CM

## 2016-12-16 DIAGNOSIS — Z7982 Long term (current) use of aspirin: Secondary | ICD-10-CM

## 2016-12-16 DIAGNOSIS — N390 Urinary tract infection, site not specified: Principal | ICD-10-CM | POA: Diagnosis present

## 2016-12-16 DIAGNOSIS — Z8249 Family history of ischemic heart disease and other diseases of the circulatory system: Secondary | ICD-10-CM

## 2016-12-16 DIAGNOSIS — I252 Old myocardial infarction: Secondary | ICD-10-CM

## 2016-12-16 DIAGNOSIS — Z79899 Other long term (current) drug therapy: Secondary | ICD-10-CM

## 2016-12-16 DIAGNOSIS — Z820 Family history of epilepsy and other diseases of the nervous system: Secondary | ICD-10-CM

## 2016-12-16 DIAGNOSIS — R05 Cough: Secondary | ICD-10-CM | POA: Diagnosis not present

## 2016-12-16 DIAGNOSIS — N189 Chronic kidney disease, unspecified: Secondary | ICD-10-CM

## 2016-12-16 DIAGNOSIS — I10 Essential (primary) hypertension: Secondary | ICD-10-CM | POA: Diagnosis present

## 2016-12-16 DIAGNOSIS — Z87891 Personal history of nicotine dependence: Secondary | ICD-10-CM

## 2016-12-16 DIAGNOSIS — E039 Hypothyroidism, unspecified: Secondary | ICD-10-CM | POA: Diagnosis present

## 2016-12-16 LAB — CBC WITH DIFFERENTIAL/PLATELET
BASOS PCT: 1 %
Basophils Absolute: 0.1 10*3/uL (ref 0.0–0.1)
Eosinophils Absolute: 0.1 10*3/uL (ref 0.0–0.7)
Eosinophils Relative: 2 %
HEMATOCRIT: 36.9 % — AB (ref 39.0–52.0)
HEMOGLOBIN: 12.2 g/dL — AB (ref 13.0–17.0)
Lymphocytes Relative: 18 %
Lymphs Abs: 1 10*3/uL (ref 0.7–4.0)
MCH: 28.2 pg (ref 26.0–34.0)
MCHC: 33.1 g/dL (ref 30.0–36.0)
MCV: 85.2 fL (ref 78.0–100.0)
Monocytes Absolute: 0.7 10*3/uL (ref 0.1–1.0)
Monocytes Relative: 13 %
NEUTROS ABS: 3.9 10*3/uL (ref 1.7–7.7)
NEUTROS PCT: 66 %
Platelets: 135 10*3/uL — ABNORMAL LOW (ref 150–400)
RBC: 4.33 MIL/uL (ref 4.22–5.81)
RDW: 14.7 % (ref 11.5–15.5)
WBC: 5.8 10*3/uL (ref 4.0–10.5)

## 2016-12-16 LAB — URINALYSIS, MICROSCOPIC (REFLEX)

## 2016-12-16 LAB — URINALYSIS, ROUTINE W REFLEX MICROSCOPIC

## 2016-12-16 NOTE — ED Notes (Signed)
Pt producing large blood clots from penis. Pt denies pain w/ this. MD made aware.

## 2016-12-16 NOTE — ED Provider Notes (Signed)
[facility] DEPT [facility] Provider Note   CSN: [id number] Arrival date & time: 12/16/16  1939  By signing my name below, I, Tim Campbell, attest that this documentation has been prepared under the direction and in the presence of Tim Johns, MD. Electronically Signed: Ludger Campbell, Scribe. 12/16/16. 9:24 PM.  History   Chief Complaint Chief Complaint  Patient presents with  . Hematuria    The history is provided by the patient. No language interpreter was used.     HPI Comments: Tim Campbell is a 83 y.o. male who presents to the Emergency Department complaining of hematuria that began yesterday. He had a laser TURP done 2 weeks ago.  He reports associated urinary retention that also began yesterday and notes at times he is only able to void a few drops. He states his last full void was about 3-4 hours ago. He states he has the urge to void but is not able to properly empty his bladder.  He reports similar symptoms in the past which has required catheterization. He denies nausea, vomiting. He currently takes Plavix.   Past Medical History:  Diagnosis Date  . AAA (abdominal aortic aneurysm) ([place])    a. 3.3x3.0cm by CT 05/2016.  Tim Campbell BPH (benign prostatic hyperplasia)   . Chronic combined systolic and diastolic HF (heart failure), NYHA class 3   . Coronary artery disease CARDIOLOGIST-  Tim Angelena Form   a. s/p CABGx5 in 1993. b. NSTEMI with occluded VG-diag in 2005. c. BMS to prox VG-PDA 2012. d. BMS to SVG-OM 03/2014. e. stable cath 05/2016.  . Diverticulosis of colon   . Heart murmur   . History of colon polyps    2002;  2007  . History of non-ST elevation myocardial infarction (NSTEMI)    x2 --- 12-29-2003  and  01-01-2013  . Hypertension   . Hypothyroidism   . Lower urinary tract symptoms (LUTS)   . Mixed hyperlipidemia   . Non Hodgkin's lymphoma (Tim Campbell) dx 2006 bone marrow bx--- onocologist-  Tim Campbell   s/p  chemo and radiation therpay--- in remission since ---  . OA  (osteoarthritis)   . Peripheral neuropathy ([place])   . Pulmonary nodules    a. by CT 05/2016  . RBBB (right bundle branch block)   . S/p bare metal coronary artery stent    10-16-2010  BMS to RCA graft  . S/P CABG x 5    05/ 1993  . 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 Urinary retention   . Wears dentures    upper  . Wears glasses     Patient Active Problem List   Diagnosis Date Noted  . Pulmonary nodules 07/07/2016  . S/P TAVR (transcatheter aortic valve replacement) 06/28/2016  . Hypotension 06/28/2016  . Thrombocytopenia ([place]) 06/28/2016  . Hypothyroidism 06/28/2016  . RBBB 06/28/2016  . AAA (abdominal aortic aneurysm) ([place])   . Severe aortic valve stenosis 06/26/2016  . Postoperative wound infection 03/11/2016  . Preoperative cardiovascular examination 02/20/2016  . Hyperlipidemia 06/17/2014  . Chronic combined systolic and diastolic HF (heart failure), NYHA class 3 ([place]) 09/09/2013  . CAD (coronary artery disease) of artery bypass graft 01/03/2013  . HTN (hypertension) 01/03/2013  . Non-ST elevation MI (NSTEMI) ([place]) 01/02/2013    Class: Acute    Past Surgical History:  Procedure Laterality Date  . CARDIAC CATHETERIZATION N/A 05/17/2016   Procedure: Right/Left Heart Cath and Coronary/Graft Angiography;  Surgeon: Nelva Bush, MD;  Location: [facility];  Service: Cardiovascular;  Laterality: N/A;  severe CAD,  total occlusion of mLAD, dLCx, and mRCA, 99% ostial LCX/ wide patent LIMA to LAD & SVG to OM, patent SVG to PDA/PL w/ stable 50% ISR at ostium SVG/ chronic occluded SVG to Diagonal/ severe AVS  . CARDIAC CATHETERIZATION  12-30-2003  Tim Leonia Reeves   severe 3v cad:  occlused SVG to diagonal causing NSTEMI,  ef 45%  . CATARACT EXTRACTION W/ INTRAOCULAR LENS  IMPLANT, BILATERAL    . COLONOSCOPY    . CORONARY ANGIOPLASTY WITH STENT PLACEMENT  10-20-2010  Tim Campbell   BMS to ostium of the RCA graft:  total occlusion LAD, CFX, and  RCA,  bypass graft 's-- total occlusion SVG to diagonal,  high-grade obstruction prox.Tim Campbell segment of RCA graft,  mid SVG to OM 50% otherwise patent/  mild AVS inferior wall hypokinesis  . CORONARY ARTERY BYPASS GRAFT  05/ 1993     Tim gerhart   LIMA to LAD,  SVG to D1,  SVT to RCA,  SVG to OM,  SVG to PDA  . [place]  . I&D EXTREMITY Left 03/09/2016   Procedure: IRRIGATION AND DEBRIDEMENT WRIST;  Surgeon: Tim Jakob, MD;  Location: [facility];  Service: Orthopedics;  Laterality: Left;  . INGUINAL HERNIA REPAIR Right 02/21/2006  . KNEE ARTHROSCOPY Right 1970'S   . LEFT AND RIGHT HEART CATHETERIZATION WITH CORONARY/GRAFT ANGIOGRAM N/A 04/05/2014   Procedure: LEFT AND RIGHT HEART CATHETERIZATION WITH Tim Campbell;  Surgeon: Tim Blanks, MD;  Location: [facility] CATH LAB;  Service: Cardiovascular;  Laterality: N/A;  . LEFT HEART CATHETERIZATION WITH CORONARY/GRAFT ANGIOGRAM N/A 01/02/2013   Procedure: LEFT HEART CATHETERIZATION WITH Tim Campbell;  Surgeon: Tim Grooms, MD;  Location: [facility] CATH LAB;  Service: Cardiovascular;  Laterality: N/A;NSTEMI:side-to-side anastomosis w/the PDA is threatened(not amenable to PCI)in-stent restenosis  RCA graft,  50% osyial SVG to OM , LIMA to LAD patent w/ diffuse distal LAD disease/  severe 3V CAD w/ occlusion LAD, CFX, RCA, ef 45%  . OPEN BX LEFT CERVICAL MASS, POSTERIOR NECK  10/03/2004  . OPEN REDUCTION INTERNAL FIXATION (ORIF) DISTAL RADIAL FRACTURE Left 02/24/2016   Procedure: OPEN TREATMENT OF LEFT DISTAL RADIUS FRACTURE;  Surgeon: Tim Jakob, MD;  Location: [facility];  Service: Orthopedics;  Laterality: Left;  . ORIF RIGHT ANKLE FX  10/07/2008  . PERCUTANEOUS CORONARY STENT INTERVENTION (PCI-S) N/A 04/21/2014   Procedure: PERCUTANEOUS CORONARY STENT INTERVENTION (PCI-S);  Surgeon: Tim Grooms, MD;  Location: [facility] C[facility] LAB;  Service: Cardiovascular;  Laterality: N/A;   BMS in the ostial/proximal SVG to CFX,   stable 50-70% ostial SVG to RCA (previous stenting)  . TEE WITHOUT CARDIOVERSION N/A 06/26/2016   Procedure: TRANSESOPHAGEAL ECHOCARDIOGRAM (TEE);  Surgeon: Tim Blanks, MD;  Location: [facility];  Service: Open Heart Surgery;  Laterality: N/A;  . THULIUM LASER TURP (TRANSURETHRAL RESECTION OF PROSTATE) N/A 12/05/2016   Procedure: THULIUM LASER TURP (TRANSURETHRAL RESECTION OF PROSTATE);  Surgeon: Nickie Retort, MD;  Location: [facility];  Service: Urology;  Laterality: N/A;  . TRANSCATHETER AORTIC VALVE REPLACEMENT, TRANSFEMORAL Bilateral 06/26/2016   Procedure: TRANSCATHETER AORTIC VALVE REPLACEMENT, TRANSFEMORAL;  Surgeon: Tim Blanks, MD;  Location: [facility];  Service: Open Heart Surgery;  Laterality: Bilateral;  . TRANSTHORACIC ECHOCARDIOGRAM  08/03/2016    post TAVR    ef 40-45%,  akinesis and scarring of the basal-inferolateral, inferior, and inferoseptal (consistent w/ infarction in distribution of the RCA), severe hypokinesis of the basal  anteroseptal (consistent w/ ischemia in distribution of the LAD but w/ patent distal graft),  grade 2 diastolic dysfunction/  AV stent valve bioprosthesis normal function w/ trivial regurg (valve area 1.16cm^2)/  . TRANSTHORACIC ECHOCARDIOGRAM  08-03-2016  continued results- post TAVR   mild dilated ascending aorta/  mild to moderate MR, peak grandient 33mHg/  severe LAE and mild RAE/  mild PR/  trivial TR       Home Medications    Prior to Admission medications   Medication Sig Start Date End Date Taking? Authorizing Provider  Ascorbic Acid (VITAMIN C) 1000 MG tablet Take 1,000 mg by mouth 3 (three) times a week. Has stopped prior to procedure    Historical Provider, MD  aspirin EC 81 MG tablet Take 81 mg by mouth daily. lst dose on 11/29/2016 per daughter, DJackelyn Poling   Historical Provider, MD  atorvastatin (LIPITOR) 40 MG tablet Take 40 mg by mouth daily at 6 PM.     Historical Provider, MD  B Complex-C (B-COMPLEX WITH VITAMIN C) tablet  Take 1 tablet by mouth 3 (three) times a week. Has stopped prior to procedure    Historical Provider, MD  cephALEXin (KEFLEX) 500 MG capsule Take 1 capsule (500 mg total) by mouth 3 (three) times daily. 12/05/16   BNickie Retort MD  CHLOROPHYLL PO Take 2 tablets by mouth once a week. Has stopped prior to procedure    Historical Provider, MD  clopidogrel (PLAVIX) 75 MG tablet Take 1 tablet (75 mg total) by mouth daily. Patient taking differently: Take 75 mg by mouth daily. Last dose on 11/27/16 per daughter, dJackelyn Poling9/21/17   Dayna N Dunn, PA-C  finasteride (PROSCAR) 5 MG tablet Take 5 mg by mouth every morning.  07/04/15   Historical Provider, MD  folic acid (FOLVITE) 8619MCG tablet Take 800 mcg by mouth 3 (three) times a week. Has stopped prior to procedure    Historical Provider, MD  hydrochlorothiazide (MICROZIDE) 12.5 MG capsule TAKE 1 CAPSULE (12.5 MG TOTAL) BY MOUTH DAILY. Patient taking differently: TAKE 1 CAPSULE (12.5 MG TOTAL) BY MOUTH DAILY 07/12/16   HBelva Crome MD  HYDROcodone-acetaminophen ([facility] 5-325 MG tablet Take 1 tablet by mouth every 6 (six) hours as needed for moderate pain. 12/05/16   BNickie Retort MD  isosorbide mononitrate (IMDUR) 60 MG 24 hr tablet Take 1.5 tablets (90 mg total) by mouth daily. Take one and a half (1.5) tablet (90 mg total) by mouth daily. Patient taking differently: Take 90 mg by mouth every morning. Take one and a half (1.5) tablet (90 mg total) by mouth daily. 07/09/16   Dayna N Dunn, PA-C  levothyroxine (SYNTHROID, LEVOTHROID) 125 MCG tablet Take 125 mcg by mouth daily before breakfast.  11/23/14   Historical Provider, MD  Magnesium 200 MG TABS Take 200 mg by mouth daily. Has stopped prior to procedure    Historical Provider, MD  metoprolol tartrate (LOPRESSOR) 25 MG tablet Take 0.5 tablets (12.5 mg total) by mouth 2 (two) times daily. Patient taking differently: Take 25 mg by mouth 2 (two) times daily. Per daughter DJackelyn Polingon 11/30/16- patient is  taking 25 mg two times daily 06/28/16   Dayna N Dunn, PA-C  Multiple Vitamins-Iron (CHLORELLA PO) Take 4 tablets by mouth 2 (two) times daily. MED NAME: CHLORELLA    Historical Provider, MD  NAT-RUL PSYLLIUM SEED HUSKS PO Take 2 tablets by mouth once a week. Has stopped prior to procedure    Historical Provider, MD  nitroGLYCERIN (NITROSTAT) 0.4 MG SL tablet Place 1 tablet (0.4 mg total) under the tongue every 5 (five) minutes as needed for chest pain. 01/05/15   Belva Crome, MD  SELENIUM PO Take 1 tablet by mouth 3 (three) times a week. Has stopped prior to procedure    Historical Provider, MD  sulfamethoxazole-trimethoprim (BACTRIM DS,SEPTRA DS) 800-160 MG tablet Take 1 tablet by mouth 2 (two) times daily. 12/17/16 12/24/16  Tim Johns, MD  tamsulosin (FLOMAX) 0.4 MG CAPS capsule Take 0.4 mg by mouth 2 (two) times daily. 05/01/16   Historical Provider, MD    Family History Family History  Problem Relation Age of Onset  . Colon cancer Father   . Stroke Mother   . CAD Brother   . Stroke Sister   . Bone cancer Brother   . Parkinson's disease Brother   . Dementia Sister   . Stroke Sister     Social History Social History  Substance Use Topics  . Smoking status: Former Smoker    Packs/day: 0.50    Years: 15.00    Types: Cigarettes    Quit date: 06/22/1961  . Smokeless tobacco: Never Used  . Alcohol use No     Allergies   Patient has no known allergies.   Review of Systems Review of Systems  Constitutional: Negative for chills, diaphoresis, fatigue and fever.  HENT: Negative for congestion, rhinorrhea and sneezing.   Eyes: Negative.   Respiratory: Negative for cough, chest tightness and shortness of breath.   Cardiovascular: Negative for chest pain and leg swelling.  Gastrointestinal: Negative for abdominal pain, blood in stool, diarrhea, nausea and vomiting.  Genitourinary: Positive for decreased urine volume, difficulty urinating and hematuria. Negative for flank pain and  frequency.  Musculoskeletal: Negative for arthralgias and back pain.  Skin: Negative for rash.  Neurological: Negative for dizziness, speech difficulty, weakness, numbness and headaches.  All other systems reviewed and are negative.    Physical Exam Updated Vital Signs BP 146/71 (BP Location: Right Arm)   Pulse 88   Temp 100.1 F (37.8 C) (Oral)   Resp 16   SpO2 94%   Physical Exam  Constitutional: He is oriented to person, place, and time. He appears well-developed and well-nourished.  HENT:  Head: Normocephalic and atraumatic.  Eyes: Pupils are equal, round, and reactive to light.  Neck: Normal range of motion. Neck supple.  Cardiovascular: Normal rate, regular rhythm and normal heart sounds.   Pulmonary/Chest: Effort normal and breath sounds normal. No respiratory distress. He has no wheezes. He has no rales. He exhibits no tenderness.  Abdominal: Soft. Bowel sounds are normal. There is no tenderness. There is no rebound and no guarding.  Genitourinary:  Genitourinary Comments: Blood dripping from urethral meatus.   Musculoskeletal: Normal range of motion. He exhibits no edema.  Lymphadenopathy:    He has no cervical adenopathy.  Neurological: He is alert and oriented to person, place, and time.  Skin: Skin is warm and dry. No rash noted.  Psychiatric: He has a normal mood and affect.  Nursing note and vitals reviewed.    ED Treatments / Results  DIAGNOSTIC STUDIES: Oxygen Saturation is 94% on room air, normal by my interpretation.    COORDINATION OF CARE: 9:18 PM Discussed treatment plan with pt at bedside and pt agreed to plan.   Labs (all labs ordered are listed, but only abnormal results are displayed) Labs Reviewed  URINALYSIS, ROUTINE W REFLEX MICROSCOPIC - Abnormal; Notable for the following:  Result Value   Color, Urine RED (*)    APPearance TURBID (*)    Glucose, UA   (*)    Value: TEST NOT REPORTED DUE TO COLOR INTERFERENCE OF URINE PIGMENT    Hgb urine dipstick   (*)    Value: TEST NOT REPORTED DUE TO COLOR INTERFERENCE OF URINE PIGMENT   Bilirubin Urine   (*)    Value: TEST NOT REPORTED DUE TO COLOR INTERFERENCE OF URINE PIGMENT   Ketones, ur   (*)    Value: TEST NOT REPORTED DUE TO COLOR INTERFERENCE OF URINE PIGMENT   Protein, ur   (*)    Value: TEST NOT REPORTED DUE TO COLOR INTERFERENCE OF URINE PIGMENT   Nitrite   (*)    Value: TEST NOT REPORTED DUE TO COLOR INTERFERENCE OF URINE PIGMENT   Leukocytes, UA   (*)    Value: TEST NOT REPORTED DUE TO COLOR INTERFERENCE OF URINE PIGMENT   All other components within normal limits  URINALYSIS, MICROSCOPIC (REFLEX) - Abnormal; Notable for the following:    Bacteria, UA MANY (*)    Squamous Epithelial / LPF 0-5 (*)    All other components within normal limits  URINALYSIS, ROUTINE W REFLEX MICROSCOPIC - Abnormal; Notable for the following:    Color, Urine RED (*)    APPearance TURBID (*)    Glucose, UA   (*)    Value: TEST NOT REPORTED DUE TO COLOR INTERFERENCE OF URINE PIGMENT   Hgb urine dipstick   (*)    Value: TEST NOT REPORTED DUE TO COLOR INTERFERENCE OF URINE PIGMENT   Bilirubin Urine   (*)    Value: TEST NOT REPORTED DUE TO COLOR INTERFERENCE OF URINE PIGMENT   Ketones, ur   (*)    Value: TEST NOT REPORTED DUE TO COLOR INTERFERENCE OF URINE PIGMENT   Protein, ur   (*)    Value: TEST NOT REPORTED DUE TO COLOR INTERFERENCE OF URINE PIGMENT   Nitrite   (*)    Value: TEST NOT REPORTED DUE TO COLOR INTERFERENCE OF URINE PIGMENT   Leukocytes, UA   (*)    Value: TEST NOT REPORTED DUE TO COLOR INTERFERENCE OF URINE PIGMENT   All other components within normal limits  CBC WITH DIFFERENTIAL/PLATELET - Abnormal; Notable for the following:    Hemoglobin 12.2 (*)    HCT 36.9 (*)    Platelets 135 (*)    All other components within normal limits  URINALYSIS, MICROSCOPIC (REFLEX) - Abnormal; Notable for the following:    Bacteria, UA FEW (*)    Squamous Epithelial / LPF 0-5  (*)    All other components within normal limits  URINE CULTURE    EKG  EKG Interpretation None       Radiology No results found.  Procedures Procedures (including critical care time)  Medications Ordered in ED Medications  sulfamethoxazole-trimethoprim (BACTRIM DS,SEPTRA DS) 800-160 MG per tablet 1 tablet (1 tablet Oral Given 12/17/16 0050)     Initial Impression / Assessment and Plan / ED Course  I have reviewed the triage vital signs and the nursing notes.  Pertinent labs & imaging results that were available during my care of the patient were reviewed by me and considered in my medical decision making (see chart for details).     Patient resents her urinary retention. He also has gross hematuria. He's 2 weeks status post laser TURP. His urinalysis cannot be reported out given the gross hematuria. It was sent  for culture. He does have a low-grade temp of 100.1. I discussed this with the urology resident on call, Tim. Lenord Fellers, he recommends discharge on Bactrim. Patient was given 1 dose in the ED. A Foley catheter was placed in the ED. Initially was draining well but now has clotted off. We will irrigate his bladder and make sure that he's emptying normally. He will be discharged following this.  Final Clinical Impressions(s) / ED Diagnoses   Final diagnoses:  Gross hematuria  Urinary retention    New Prescriptions New Prescriptions   SULFAMETHOXAZOLE-TRIMETHOPRIM (BACTRIM DS,SEPTRA DS) 800-160 MG TABLET    Take 1 tablet by mouth 2 (two) times daily.   I personally performed the services described in this documentation, which was scribed in my presence.  The recorded information has been reviewed and considered.    Tim Johns, MD 12/17/16 [phone]

## 2016-12-16 NOTE — ED Triage Notes (Signed)
Pt c/o hematuria, onset today; laser procedure on prostate 2 wks ago

## 2016-12-17 ENCOUNTER — Encounter (HOSPITAL_COMMUNITY): Payer: Self-pay | Admitting: Internal Medicine

## 2016-12-17 ENCOUNTER — Emergency Department (HOSPITAL_BASED_OUTPATIENT_CLINIC_OR_DEPARTMENT_OTHER): Payer: Medicare Other

## 2016-12-17 DIAGNOSIS — N183 Chronic kidney disease, stage 3 (moderate): Secondary | ICD-10-CM

## 2016-12-17 DIAGNOSIS — R310 Gross hematuria: Secondary | ICD-10-CM | POA: Insufficient documentation

## 2016-12-17 DIAGNOSIS — Z7982 Long term (current) use of aspirin: Secondary | ICD-10-CM | POA: Diagnosis not present

## 2016-12-17 DIAGNOSIS — D696 Thrombocytopenia, unspecified: Secondary | ICD-10-CM

## 2016-12-17 DIAGNOSIS — Z87891 Personal history of nicotine dependence: Secondary | ICD-10-CM | POA: Diagnosis not present

## 2016-12-17 DIAGNOSIS — I25810 Atherosclerosis of coronary artery bypass graft(s) without angina pectoris: Secondary | ICD-10-CM | POA: Diagnosis present

## 2016-12-17 DIAGNOSIS — I5042 Chronic combined systolic (congestive) and diastolic (congestive) heart failure: Secondary | ICD-10-CM | POA: Diagnosis not present

## 2016-12-17 DIAGNOSIS — Z9841 Cataract extraction status, right eye: Secondary | ICD-10-CM | POA: Diagnosis not present

## 2016-12-17 DIAGNOSIS — Z800 Family history of malignant neoplasm of digestive organs: Secondary | ICD-10-CM | POA: Diagnosis not present

## 2016-12-17 DIAGNOSIS — Z820 Family history of epilepsy and other diseases of the nervous system: Secondary | ICD-10-CM | POA: Diagnosis not present

## 2016-12-17 DIAGNOSIS — Z86010 Personal history of colonic polyps: Secondary | ICD-10-CM | POA: Diagnosis not present

## 2016-12-17 DIAGNOSIS — Z923 Personal history of irradiation: Secondary | ICD-10-CM | POA: Diagnosis not present

## 2016-12-17 DIAGNOSIS — Z952 Presence of prosthetic heart valve: Secondary | ICD-10-CM | POA: Diagnosis not present

## 2016-12-17 DIAGNOSIS — I10 Essential (primary) hypertension: Secondary | ICD-10-CM | POA: Diagnosis not present

## 2016-12-17 DIAGNOSIS — R509 Fever, unspecified: Secondary | ICD-10-CM | POA: Diagnosis not present

## 2016-12-17 DIAGNOSIS — Z808 Family history of malignant neoplasm of other organs or systems: Secondary | ICD-10-CM | POA: Diagnosis not present

## 2016-12-17 DIAGNOSIS — E039 Hypothyroidism, unspecified: Secondary | ICD-10-CM | POA: Diagnosis not present

## 2016-12-17 DIAGNOSIS — R05 Cough: Secondary | ICD-10-CM | POA: Diagnosis not present

## 2016-12-17 DIAGNOSIS — I25708 Atherosclerosis of coronary artery bypass graft(s), unspecified, with other forms of angina pectoris: Secondary | ICD-10-CM | POA: Diagnosis not present

## 2016-12-17 DIAGNOSIS — Z8249 Family history of ischemic heart disease and other diseases of the circulatory system: Secondary | ICD-10-CM | POA: Diagnosis not present

## 2016-12-17 DIAGNOSIS — I130 Hypertensive heart and chronic kidney disease with heart failure and stage 1 through stage 4 chronic kidney disease, or unspecified chronic kidney disease: Secondary | ICD-10-CM | POA: Diagnosis present

## 2016-12-17 DIAGNOSIS — R339 Retention of urine, unspecified: Secondary | ICD-10-CM

## 2016-12-17 DIAGNOSIS — N189 Chronic kidney disease, unspecified: Secondary | ICD-10-CM

## 2016-12-17 DIAGNOSIS — R338 Other retention of urine: Secondary | ICD-10-CM | POA: Diagnosis present

## 2016-12-17 DIAGNOSIS — Z823 Family history of stroke: Secondary | ICD-10-CM | POA: Diagnosis not present

## 2016-12-17 DIAGNOSIS — N179 Acute kidney failure, unspecified: Secondary | ICD-10-CM | POA: Diagnosis present

## 2016-12-17 DIAGNOSIS — Z9842 Cataract extraction status, left eye: Secondary | ICD-10-CM | POA: Diagnosis not present

## 2016-12-17 DIAGNOSIS — Z961 Presence of intraocular lens: Secondary | ICD-10-CM | POA: Diagnosis present

## 2016-12-17 DIAGNOSIS — N390 Urinary tract infection, site not specified: Secondary | ICD-10-CM | POA: Diagnosis present

## 2016-12-17 DIAGNOSIS — M1990 Unspecified osteoarthritis, unspecified site: Secondary | ICD-10-CM | POA: Diagnosis present

## 2016-12-17 DIAGNOSIS — Z9221 Personal history of antineoplastic chemotherapy: Secondary | ICD-10-CM | POA: Diagnosis not present

## 2016-12-17 DIAGNOSIS — E785 Hyperlipidemia, unspecified: Secondary | ICD-10-CM

## 2016-12-17 DIAGNOSIS — I2510 Atherosclerotic heart disease of native coronary artery without angina pectoris: Secondary | ICD-10-CM | POA: Diagnosis present

## 2016-12-17 LAB — BASIC METABOLIC PANEL
ANION GAP: 6 (ref 5–15)
BUN: 21 mg/dL — AB (ref 6–20)
CALCIUM: 8.6 mg/dL — AB (ref 8.9–10.3)
CO2: 26 mmol/L (ref 22–32)
Chloride: 104 mmol/L (ref 101–111)
Creatinine, Ser: 1.54 mg/dL — ABNORMAL HIGH (ref 0.61–1.24)
GFR calc Af Amer: 45 mL/min — ABNORMAL LOW (ref >60)
GFR, EST NON AFRICAN AMERICAN: 39 mL/min — AB (ref >60)
GLUCOSE: 106 mg/dL — AB (ref 65–99)
Potassium: 4 mmol/L (ref 3.5–5.1)
Sodium: 136 mmol/L (ref 135–145)

## 2016-12-17 LAB — CBC
HEMATOCRIT: 37.1 % — AB (ref 39.0–52.0)
HEMOGLOBIN: 12.3 g/dL — AB (ref 13.0–17.0)
MCH: 27.8 pg (ref 26.0–34.0)
MCHC: 33.2 g/dL (ref 30.0–36.0)
MCV: 83.9 fL (ref 78.0–100.0)
Platelets: 121 10*3/uL — ABNORMAL LOW (ref 150–400)
RBC: 4.42 MIL/uL (ref 4.22–5.81)
RDW: 15.2 % (ref 11.5–15.5)
WBC: 6.1 10*3/uL (ref 4.0–10.5)

## 2016-12-17 MED ORDER — ACETAMINOPHEN 325 MG PO TABS
650.0000 mg | ORAL_TABLET | Freq: Four times a day (QID) | ORAL | Status: DC | PRN
  Administered 2016-12-18 – 2016-12-19 (×3): 650 mg via ORAL
  Filled 2016-12-17 (×3): qty 2

## 2016-12-17 MED ORDER — ACETAMINOPHEN 650 MG RE SUPP: 650.0000 mg | Freq: Four times a day (QID) | RECTAL | Status: DC | PRN

## 2016-12-17 MED ORDER — BISACODYL 10 MG RE SUPP: 10.0000 mg | Freq: Every day | RECTAL | Status: DC | PRN

## 2016-12-17 MED ORDER — SODIUM CHLORIDE 0.9 % IV SOLN
INTRAVENOUS | Status: AC
  Administered 2016-12-17: 12:00:00 via INTRAVENOUS

## 2016-12-17 MED ORDER — METOPROLOL TARTRATE 25 MG PO TABS
25.0000 mg | ORAL_TABLET | Freq: Two times a day (BID) | ORAL | Status: DC
  Administered 2016-12-17 (×2): 25 mg via ORAL
  Filled 2016-12-17 (×3): qty 1

## 2016-12-17 MED ORDER — TAMSULOSIN HCL 0.4 MG PO CAPS
0.4000 mg | ORAL_CAPSULE | Freq: Two times a day (BID) | ORAL | Status: DC
  Administered 2016-12-17 – 2016-12-19 (×5): 0.4 mg via ORAL
  Filled 2016-12-17 (×5): qty 1

## 2016-12-17 MED ORDER — ZOLPIDEM TARTRATE 5 MG PO TABS
5.0000 mg | ORAL_TABLET | Freq: Once | ORAL | Status: AC
  Administered 2016-12-17: 5 mg via ORAL
  Filled 2016-12-17: qty 1

## 2016-12-17 MED ORDER — SENNOSIDES-DOCUSATE SODIUM 8.6-50 MG PO TABS: 1.0000 | ORAL_TABLET | Freq: Every evening | ORAL | Status: DC | PRN

## 2016-12-17 MED ORDER — MAGNESIUM OXIDE 400 (241.3 MG) MG PO TABS
400.0000 mg | ORAL_TABLET | Freq: Every day | ORAL | Status: DC
  Administered 2016-12-17 – 2016-12-19 (×3): 400 mg via ORAL
  Filled 2016-12-17 (×3): qty 1

## 2016-12-17 MED ORDER — SULFAMETHOXAZOLE-TRIMETHOPRIM 800-160 MG PO TABS
1.0000 | ORAL_TABLET | Freq: Once | ORAL | Status: AC
  Administered 2016-12-17: 1 via ORAL
  Filled 2016-12-17: qty 1

## 2016-12-17 MED ORDER — FINASTERIDE 5 MG PO TABS
5.0000 mg | ORAL_TABLET | Freq: Every morning | ORAL | Status: DC
  Administered 2016-12-17 – 2016-12-19 (×3): 5 mg via ORAL
  Filled 2016-12-17 (×3): qty 1

## 2016-12-17 MED ORDER — CEFTRIAXONE SODIUM 1 G IJ SOLR
1.0000 g | Freq: Once | INTRAMUSCULAR | Status: AC
  Administered 2016-12-17: 1 g via INTRAVENOUS

## 2016-12-17 MED ORDER — ATORVASTATIN CALCIUM 40 MG PO TABS
40.0000 mg | ORAL_TABLET | Freq: Every day | ORAL | Status: DC
  Administered 2016-12-17 – 2016-12-18 (×2): 40 mg via ORAL
  Filled 2016-12-17 (×2): qty 1

## 2016-12-17 MED ORDER — NITROGLYCERIN 0.4 MG SL SUBL: 0.4000 mg | SUBLINGUAL_TABLET | SUBLINGUAL | Status: DC | PRN

## 2016-12-17 MED ORDER — LEVOTHYROXINE SODIUM 25 MCG PO TABS
125.0000 ug | ORAL_TABLET | Freq: Every day | ORAL | Status: DC
  Administered 2016-12-17 – 2016-12-19 (×3): 125 ug via ORAL
  Filled 2016-12-17 (×3): qty 1

## 2016-12-17 MED ORDER — ACETAMINOPHEN 500 MG PO TABS
ORAL_TABLET | ORAL | Status: AC
  Administered 2016-12-17: 1000 mg
  Filled 2016-12-17: qty 2

## 2016-12-17 MED ORDER — ALBUTEROL SULFATE (2.5 MG/3ML) 0.083% IN NEBU: 2.5000 mg | INHALATION_SOLUTION | RESPIRATORY_TRACT | Status: DC | PRN

## 2016-12-17 MED ORDER — SULFAMETHOXAZOLE-TRIMETHOPRIM 800-160 MG PO TABS: 1.0000 | ORAL_TABLET | Freq: Two times a day (BID) | ORAL | 0 refills | Status: DC

## 2016-12-17 MED ORDER — DEXTROSE 5 % IV SOLN
INTRAVENOUS | Status: AC
  Administered 2016-12-17: 1 g via INTRAVENOUS
  Filled 2016-12-17: qty 10

## 2016-12-17 MED ORDER — ISOSORBIDE MONONITRATE ER 30 MG PO TB24
90.0000 mg | ORAL_TABLET | Freq: Every morning | ORAL | Status: DC
  Administered 2016-12-17 – 2016-12-18 (×2): 90 mg via ORAL
  Filled 2016-12-17 (×2): qty 3

## 2016-12-17 MED ORDER — DEXTROSE 5 % IV SOLN
1.0000 g | INTRAVENOUS | Status: DC
  Administered 2016-12-18 – 2016-12-19 (×2): 1 g via INTRAVENOUS
  Filled 2016-12-17 (×2): qty 10

## 2016-12-17 NOTE — Consult Note (Signed)
New Urology Consult Note   Requesting Attending Physician:  Rolland Porter, MD Service Providing Consult: Urology Consulting Attending: Tresa Moore  Assessment:  Patient is a 83 y.o. male with medical history per HPI (significant for extensive cardiac disease on ASA/plavix) who is s/p thulium laser ablation prostate 12/05/16 with Dr. Pilar Jarvis who presents with gross hematuria, clot retention, and fever to 102.6. Catheter exchanged by urology to a 24Fr 3-way hematuria catheter with initiation of CBI.   Recommendations: 1. Continuous bladder irrigation. Goal will be to keep urine pink-tinged or more clear. Nursing staff can adjust rate PRN.  2. We sent another urine culture from ER on hematuria catheter placement. Follow up urine cultures sent during ER stay. Would favor continuing ceftriaxone at this point based on fever 3. Continue flomax and finasteride 4. If medically appropriate would be beneficial to hold ASA/plavix secondary to active bleeding 5. Likely requires IVF resuscitation but will defer to medical team regarding this based on CHF & cardiac history 6. Do not remove Foley  Thank you for this consult. Please contact the urology consult pager with any further questions/concerns. Sharmaine Base, MD Urology Surgical Resident  HPI -   Reason for Consult:  Gross hematuria  Tim Campbell is seen in consultation for reasons noted above at the request of Rolland Porter, MD.  This is a 83 yo patient who is about 2 weeks out from a thulium laser ablation of the prostate with Dr. Pilar Jarvis. He carries a PMH of AAA, CHF, CAD s/p CABG & stents s/p NSTEMI and s/p TAVR on ASA/plavix, HTN, hypothyroidism, HLD, Non-Hodgkin's lymphoma now in remission, Oa.  He noticed some pink tinged urine last Friday (previously clear yellow). Then by last evening this progressed to dark cherry red urine with inability to urinate. He then presented to [facility] where a catheter was placed and irrigated. This clotted off so a  20Fr 2-way catheter was placed and the bladder was further irrigated with removal of clots. The patient continued to have significant gross hematuria [facility]) refractory to flushing/irrigation and therefore was transferred to [facility].  He tells me that he has had no new back pain, flank pain, abdominal pain, n/v, known fevers, chills.  He had a temp of 102.6 at 5:30am, currently 100.8. Has received bactrim PO x1 and IV CTX x1. Cr 1.54 from 1.15. WBC 5.8. Hb 12.2, stable from 2/21. Urinalysis inconclusive secondary to significant hematuria but shows 6-30 WBC/HPF, many to few bacteria and TNTC RBCs. Urine culture pending. CXR does not suggest URI/PNA.   Past Medical History: Past Medical History:  Diagnosis Date  . AAA (abdominal aortic aneurysm) ([place])    a. 3.3x3.0cm by CT 05/2016.  Marland Kitchen BPH (benign prostatic hyperplasia)   . Chronic combined systolic and diastolic HF (heart failure), NYHA class 3   . Coronary artery disease CARDIOLOGIST-  DR Angelena Form   a. s/p CABGx5 in 1993. b. NSTEMI with occluded VG-diag in 2005. c. BMS to prox VG-PDA 2012. d. BMS to SVG-OM 03/2014. e. stable cath 05/2016.  . Diverticulosis of colon   . Heart murmur   . History of colon polyps    2002;  2007  . History of non-ST elevation myocardial infarction (NSTEMI)    x2 --- 12-29-2003  and  01-01-2013  . Hypertension   . Hypothyroidism   . Lower urinary tract symptoms (LUTS)   . Mixed hyperlipidemia   . Non Hodgkin's lymphoma ([place]) dx 2006 bone marrow bx--- onocologist-  dr Marin Olp   s/p  chemo and radiation therpay--- in remission since ---  . OA (osteoarthritis)   . Peripheral neuropathy ([place])   . Pulmonary nodules    a. by CT 05/2016  . RBBB (right bundle branch block)   . S/p bare metal coronary artery stent    10-16-2010  BMS to RCA graft  . S/P CABG x 5    05/ 1993  . 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 Urinary retention   . Wears dentures    upper  . Wears glasses     Past  Surgical History:  Past Surgical History:  Procedure Laterality Date  . CARDIAC CATHETERIZATION N/A 05/17/2016   Procedure: Right/Left Heart Cath and Coronary/Graft Angiography;  Surgeon: Nelva Bush, MD;  Location: [place] [facility];  Service: Cardiovascular;  Laterality: N/A;  severe CAD,  total occlusion of mLAD, dLCx, and mRCA, 99% ostial LCX/ wide patent LIMA to LAD & SVG to OM, patent SVG to PDA/PL w/ stable 50% ISR at ostium SVG/ chronic occluded SVG to Diagonal/ severe AVS  . CARDIAC CATHETERIZATION  12-30-2003  dr Leonia Reeves   severe 3v cad:  occlused SVG to diagonal causing NSTEMI,  ef 45%  . CATARACT EXTRACTION W/ INTRAOCULAR LENS  IMPLANT, BILATERAL    . COLONOSCOPY    . CORONARY ANGIOPLASTY WITH STENT PLACEMENT  10-20-2010  dr Daneen Schick   BMS to ostium of the RCA graft:  total occlusion LAD, CFX, and RCA,  bypass graft 's-- total occlusion SVG to diagonal,  high-grade obstruction prox.Alfredo Batty segment of RCA graft,  mid SVG to OM 50% otherwise patent/  mild AVS inferior wall hypokinesis  . CORONARY ARTERY BYPASS GRAFT  05/ 1993     dr gerhart   LIMA to LAD,  SVG to D1,  SVT to RCA,  SVG to OM,  SVG to PDA  . [place]  . I&D EXTREMITY Left 03/09/2016   Procedure: IRRIGATION AND DEBRIDEMENT WRIST;  Surgeon: Milly Jakob, MD;  Location: [place];  Service: Orthopedics;  Laterality: Left;  . INGUINAL HERNIA REPAIR Right 02/21/2006  . KNEE ARTHROSCOPY Right 1970'S   . LEFT AND RIGHT HEART CATHETERIZATION WITH CORONARY/GRAFT ANGIOGRAM N/A 04/05/2014   Procedure: LEFT AND RIGHT HEART CATHETERIZATION WITH [name];  Surgeon: Burnell Blanks, MD;  Location: [facility] CATH LAB;  Service: Cardiovascular;  Laterality: N/A;  . LEFT HEART CATHETERIZATION WITH CORONARY/GRAFT ANGIOGRAM N/A 01/02/2013   Procedure: LEFT HEART CATHETERIZATION WITH [name];  Surgeon: [name], MD;  Location: [facility] CATH LAB;  Service: Cardiovascular;  Laterality:  N/A;NSTEMI:side-to-side anastomosis w/the PDA is threatened(not amenable to PCI)in-stent restenosis  RCA graft,  50% osyial SVG to OM , LIMA to LAD patent w/ diffuse distal LAD disease/  severe 3V CAD w/ occlusion LAD, CFX, RCA, ef 45%  . OPEN BX LEFT CERVICAL MASS, POSTERIOR NECK  10/03/2004  . OPEN REDUCTION INTERNAL FIXATION (ORIF) DISTAL RADIAL FRACTURE Left 02/24/2016   Procedure: OPEN TREATMENT OF LEFT DISTAL RADIUS FRACTURE;  Surgeon: Milly Jakob, MD;  Location: [place];  Service: Orthopedics;  Laterality: Left;  . ORIF RIGHT ANKLE FX  10/07/2008  . PERCUTANEOUS CORONARY STENT INTERVENTION (PCI-S) N/A 04/21/2014   Procedure: PERCUTANEOUS CORONARY STENT INTERVENTION (PCI-S);  Surgeon: [name], MD;  Location: [facility] CATH LAB;  Service: Cardiovascular;  Laterality: N/A;   BMS in the ostial/proximal SVG to CFX,  stable 50-70% ostial SVG to RCA (previous stenting)  . TEE WITHOUT CARDIOVERSION N/A 06/26/2016  Procedure: TRANSESOPHAGEAL ECHOCARDIOGRAM (TEE);  Surgeon: Burnell Blanks, MD;  Location: [place];  Service: Open Heart Surgery;  Laterality: N/A;  . THULIUM LASER TURP (TRANSURETHRAL RESECTION OF PROSTATE) N/A 12/05/2016   Procedure: THULIUM LASER TURP (TRANSURETHRAL RESECTION OF PROSTATE);  Surgeon: Nickie Retort, MD;  Location: [facility];  Service: Urology;  Laterality: N/A;  . TRANSCATHETER AORTIC VALVE REPLACEMENT, TRANSFEMORAL Bilateral 06/26/2016   Procedure: TRANSCATHETER AORTIC VALVE REPLACEMENT, TRANSFEMORAL;  Surgeon: Burnell Blanks, MD;  Location: [place];  Service: Open Heart Surgery;  Laterality: Bilateral;  . TRANSTHORACIC ECHOCARDIOGRAM  08/03/2016    post TAVR    ef 40-45%,  akinesis and scarring of the basal-inferolateral, inferior, and inferoseptal (consistent w/ infarction in distribution of the RCA), severe hypokinesis of the basal anteroseptal (consistent w/ ischemia in distribution of the LAD but w/ patent distal graft),  grade 2 diastolic dysfunction/  AV  stent valve bioprosthesis normal function w/ trivial regurg (valve area 1.16cm^2)/  . TRANSTHORACIC ECHOCARDIOGRAM  08-03-2016  continued results- post TAVR   mild dilated ascending aorta/  mild to moderate MR, peak grandient 35mHg/  severe LAE and mild RAE/  mild PR/  trivial TR    Medication: No current facility-administered medications for this encounter.    Current Outpatient Prescriptions  Medication Sig Dispense Refill  . Ascorbic Acid (VITAMIN C) 1000 MG tablet Take 1,000 mg by mouth 3 (three) times a week. Has stopped prior to procedure    . aspirin EC 81 MG tablet Take 81 mg by mouth daily. lst dose on 11/29/2016 per daughter, DJackelyn Poling   . atorvastatin (LIPITOR) 40 MG tablet Take 40 mg by mouth daily at 6 PM.     . B Complex-C (B-COMPLEX WITH VITAMIN C) tablet Take 1 tablet by mouth 3 (three) times a week. Has stopped prior to procedure    . cephALEXin (KEFLEX) 500 MG capsule Take 1 capsule (500 mg total) by mouth 3 (three) times daily. 6 capsule 0  . CHLOROPHYLL PO Take 2 tablets by mouth once a week. Has stopped prior to procedure    . clopidogrel (PLAVIX) 75 MG tablet Take 1 tablet (75 mg total) by mouth daily. (Patient taking differently: Take 75 mg by mouth daily. Last dose on 11/27/16 per daughter, debbie) 30 tablet 6  . finasteride (PROSCAR) 5 MG tablet Take 5 mg by mouth every morning.   11  . folic acid (FOLVITE) 8559MCG tablet Take 800 mcg by mouth 3 (three) times a week. Has stopped prior to procedure    . hydrochlorothiazide (MICROZIDE) 12.5 MG capsule TAKE 1 CAPSULE (12.5 MG TOTAL) BY MOUTH DAILY. (Patient taking differently: TAKE 1 CAPSULE (12.5 MG TOTAL) BY MOUTH DAILY) 30 capsule 11  . HYDROcodone-acetaminophen (NORCO) 5-325 MG tablet Take 1 tablet by mouth every 6 (six) hours as needed for moderate pain. 30 tablet 0  . isosorbide mononitrate (IMDUR) 60 MG 24 hr tablet Take 1.5 tablets (90 mg total) by mouth daily. Take one and a half (1.5) tablet (90 mg total) by mouth  daily. (Patient taking differently: Take 90 mg by mouth every morning. Take one and a half (1.5) tablet (90 mg total) by mouth daily.) 135 tablet 3  . levothyroxine (SYNTHROID, LEVOTHROID) 125 MCG tablet Take 125 mcg by mouth daily before breakfast.   3  . Magnesium 200 MG TABS Take 200 mg by mouth daily. Has stopped prior to procedure    . metoprolol tartrate (LOPRESSOR) 25 MG tablet Take 0.5 tablets (12.5  mg total) by mouth 2 (two) times daily. (Patient taking differently: Take 25 mg by mouth 2 (two) times daily. Per daughter Jackelyn Poling on 11/30/16- patient is taking 25 mg two times daily) 60 tablet 3  . Multiple Vitamins-Iron (CHLORELLA PO) Take 4 tablets by mouth 2 (two) times daily. MED NAME: CHLORELLA    . NAT-RUL PSYLLIUM SEED HUSKS PO Take 2 tablets by mouth once a week. Has stopped prior to procedure    . nitroGLYCERIN (NITROSTAT) 0.4 MG SL tablet Place 1 tablet (0.4 mg total) under the tongue every 5 (five) minutes as needed for chest pain. 25 tablet 11  . SELENIUM PO Take 1 tablet by mouth 3 (three) times a week. Has stopped prior to procedure    . sulfamethoxazole-trimethoprim (BACTRIM DS,SEPTRA DS) 800-160 MG tablet Take 1 tablet by mouth 2 (two) times daily. 14 tablet 0  . tamsulosin (FLOMAX) 0.4 MG CAPS capsule Take 0.4 mg by mouth 2 (two) times daily.  11    Allergies: No Known Allergies  Social History: Social History  Substance Use Topics  . Smoking status: Former Smoker    Packs/day: 0.50    Years: 15.00    Types: Cigarettes    Quit date: 06/22/1961  . Smokeless tobacco: Never Used  . Alcohol use No    Family History Family History  Problem Relation Age of Onset  . Colon cancer Father   . Stroke Mother   . CAD Brother   . Stroke Sister   . Bone cancer Brother   . Parkinson's disease Brother   . Dementia Sister   . Stroke Sister     Review of Systems 10 systems were reviewed and are negative except as noted specifically in the HPI.  Objective   Vital signs in  last 24 hours: BP 119/69 (BP Location: Left Arm)   Pulse 77   Temp 100.8 F (38.2 C) (Oral)   Resp 18   SpO2 96%   Intake/Output last 3 shifts: I/O last 3 completed shifts: In: 750 [Other:750] Out: 1200 [Urine:1200]  Physical Exam General: NAD, A&O, resting, appropriate HEENT: [place]/AT, EOMI, MMM Pulmonary: Normal work of breathing on RA Cardiovascular: Regular rate & rhythm, HDS, adequate peripheral perfusion Abdomen: soft, NTTP, nondistended, no suprapubic fullness or tenderness GU: blood clot at urethral meatus and in groin, 20Fr catheter draining dark cherry red urine on arrival, no CVA tenderness, circumcised male phallus DRE: deferred Extremities: warm and well perfused, no edema Neuro: Appropriate, no focal neurological deficits  Most Recent Labs: Lab Results  Component Value Date   WBC 5.8 12/16/2016   HGB 12.2 (L) 12/16/2016   HCT 36.9 (L) 12/16/2016   PLT 135 (L) 12/16/2016    Lab Results  Component Value Date   NA 136 12/16/2016   K 4.0 12/16/2016   CL 104 12/16/2016   CO2 26 12/16/2016   BUN 21 (H) 12/16/2016   CREATININE 1.54 (H) 12/16/2016   CALCIUM 8.6 (L) 12/16/2016   MG 2.0 06/27/2016    Lab Results  Component Value Date   ALKPHOS 54 06/22/2016   BILITOT 0.9 06/22/2016   PROT 6.1 (L) 06/22/2016   ALBUMIN 3.7 06/22/2016   ALT 13 (L) 06/22/2016   AST 21 06/22/2016    Lab Results  Component Value Date   INR 1.37 06/26/2016   APTT 40 (H) 06/26/2016     Urine Culture: Pending   IMAGING: Dg Chest 2 View  Result Date: 12/17/2016 CLINICAL DATA:  83 year old male with cough and  fever. EXAM: CHEST  2 VIEW COMPARISON:  Chest radiograph dated 06/27/2016 FINDINGS: There is stable mild to moderate cardiomegaly. Median sternotomy changes, CABG vascular clips, and aortic valve replacement noted. There is no focal consolidation, pleural effusion, or pneumothorax. There is osteopenia with degenerative changes of the spine. No acute osseous pathology  identified. IMPRESSION: No acute cardiopulmonary process. Stable mild to moderate cardiomegaly and postsurgical changes of CABG and aortic valve replacement. No vascular congestion or pulmonary edema. Electronically Signed   By: Anner Crete M.D.   On: 12/17/2016 02:05    PROCEDURES: Existing catheter was removed after removing sterile water from catheter balloon. The patient was prepped with betadine and draped in usual fashion for Foley placement. A 24Fr 3-way hematuria catheter was advanced into the bladder without difficulty. 30cc sterile water was placed in the balloon. This was gently brought back to the bladder neck. The bladder was flushed with about 600cc sterile water with titration of urine down to a watermelon color and removal of minimal small clot burden (<5cc). The catheter was attached to CBI which was run at a medium drip with production of light watermelon tinged urine. The catheter was attached to a standing gravity bag.       I have seen and examined the patient and agree with above.  Briefly, S: approx 2 weeks s/p laser prostate vaporiazation with clot retention and fevers c/w cystitis.  O: NAD, family at bedside AOx3 SNTND Foley in place with dark urine, some clots with non leg-strap fastening  SCD's in place  Clot irrigated with 1L NS in 1m aliquots with removal approx 1027madditional formed clot. Connected to two strap traction and reconnected to CBI, irriation light pink. Pt, family, and RN instructed that foley to remain on traction.   A/P: - current catheter on irrigation to remain, Dr. BuPilar Jarvispts primary Urologist) has been made aware of admission.  - May consdier hold plavix temporarily while actively bleeding, I feel OK to stay on ASA.

## 2016-12-17 NOTE — ED Notes (Signed)
No respiratory or acute distress noted alert and oriented x 3 no reaction to medication noted no pain voiced call light in reach visitors at bedside, bright red hematuria noted in foley bag.

## 2016-12-17 NOTE — H&P (Addendum)
History and Physical    NALU TROUBLEFIELD NLZ:[id number] DOB: 1926-06-02 DOA: 12/16/2016  PCP: [name], MD   I have briefly reviewed patients previous medical reports in [facility].  Patient coming from: Home  Chief Complaint: Pilar Plate blood in urine and difficulty urinating.  HPI: Tim Campbell is a 83 year old male, married, independent of activities of daily living, PMH of AAA, BPH with history of Foley catheter for intermittent urinary retention, chronic combined CHF, CAD status post CABG and stent on aspirin and Plavix (cardiology/Dr. Daneen Schick), HTN, hypothyroid, HLD, NHL in remission, TAVR, status post thallium laser ablation of prostate on 12/05/16 (Dr. Pilar Jarvis, Urology), presented to [facility] [place] with history of hematuria and difficulty urinating. Post urology procedure on 12/05/16, patient returned home with a Foley catheter which was removed 48 hours later. He was doing well with minimal hematuria and no other urinary symptoms or fever. 48 hours prior to this admission, he started noticing worsening blood in the urine, blood clots, difficulty urinating with decreased stream and dribbling but denied pain, fever or chills. He had no other symptoms. He initially went to [facility] [place] where a new Foley catheter was placed, urology was consulted and plans were to discharge home on oral Bactrim. However the catheter got blocked again and patient spiked a fever. He was then transferred to [facility] ED as per urology recommendations. Mild dry occasional cough without dyspnea or chest pain. He denies any other complaints.  ED Course: Febrile up to 102.58F in the ED. Other vital signs stable. Lab work significant for platelets 135, BUN 21, creatinine 1.54, chest x-ray without acute cardiopulmonary process, urine microscopy showing gross hematuria. Urinary catheter exchanged by urology to a 24 [place] three-way hematuria catheter and CBI was initiated with  improvement in hematuria and patient feels better.   Review of Systems:  All other systems reviewed and apart from HPI, are negative.  Past Medical History:  Diagnosis Date  . AAA (abdominal aortic aneurysm) ([place])    a. 3.3x3.0cm by CT 05/2016.  Marland Kitchen BPH (benign prostatic hyperplasia)   . Chronic combined systolic and diastolic HF (heart failure), NYHA class 3   . Coronary artery disease CARDIOLOGIST-  DR Angelena Form   a. s/p CABGx5 in 1993. b. NSTEMI with occluded VG-diag in 2005. c. BMS to prox VG-PDA 2012. d. BMS to SVG-OM 03/2014. e. stable cath 05/2016.  . Diverticulosis of colon   . Heart murmur   . History of colon polyps    2002;  2007  . History of non-ST elevation myocardial infarction (NSTEMI)    x2 --- 12-29-2003  and  01-01-2013  . Hypertension   . Hypothyroidism   . Lower urinary tract symptoms (LUTS)   . Mixed hyperlipidemia   . Non Hodgkin's lymphoma ([place]) dx 2006 bone marrow bx--- onocologist-  dr Marin Olp   s/p  chemo and radiation therpay--- in remission since ---  . OA (osteoarthritis)   . Peripheral neuropathy ([place])   . Pulmonary nodules    a. by CT 05/2016  . RBBB (right bundle branch block)   . S/p bare metal coronary artery stent    10-16-2010  BMS to RCA graft  . S/P CABG x 5    05/ 1993  . 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 Urinary retention   . Wears dentures    upper  . Wears glasses     Past Surgical History:  Procedure Laterality Date  .  CARDIAC CATHETERIZATION N/A 05/17/2016   Procedure: Right/Left Heart Cath and Coronary/Graft Angiography;  Surgeon: Nelva Bush, MD;  Location: [place] [facility];  Service: Cardiovascular;  Laterality: N/A;  severe CAD,  total occlusion of mLAD, dLCx, and mRCA, 99% ostial LCX/ wide patent LIMA to LAD & SVG to OM, patent SVG to PDA/PL w/ stable 50% ISR at ostium SVG/ chronic occluded SVG to Diagonal/ severe AVS  . CARDIAC CATHETERIZATION  12-30-2003  dr Leonia Reeves   severe 3v cad:  occlused  SVG to diagonal causing NSTEMI,  ef 45%  . CATARACT EXTRACTION W/ INTRAOCULAR LENS  IMPLANT, BILATERAL    . COLONOSCOPY    . CORONARY ANGIOPLASTY WITH STENT PLACEMENT  10-20-2010  dr Daneen Schick   BMS to ostium of the RCA graft:  total occlusion LAD, CFX, and RCA,  bypass graft 's-- total occlusion SVG to diagonal,  high-grade obstruction prox.Alfredo Batty segment of RCA graft,  mid SVG to OM 50% otherwise patent/  mild AVS inferior wall hypokinesis  . CORONARY ARTERY BYPASS GRAFT  05/ 1993     dr gerhart   LIMA to LAD,  SVG to D1,  SVT to RCA,  SVG to OM,  SVG to PDA  . [place]  . I&D EXTREMITY Left 03/09/2016   Procedure: IRRIGATION AND DEBRIDEMENT WRIST;  Surgeon: Milly Jakob, MD;  Location: [place];  Service: Orthopedics;  Laterality: Left;  . INGUINAL HERNIA REPAIR Right 02/21/2006  . KNEE ARTHROSCOPY Right 1970'S   . LEFT AND RIGHT HEART CATHETERIZATION WITH CORONARY/GRAFT ANGIOGRAM N/A 04/05/2014   Procedure: LEFT AND RIGHT HEART CATHETERIZATION WITH [name];  Surgeon: Burnell Blanks, MD;  Location: [facility] CATH LAB;  Service: Cardiovascular;  Laterality: N/A;  . LEFT HEART CATHETERIZATION WITH CORONARY/GRAFT ANGIOGRAM N/A 01/02/2013   Procedure: LEFT HEART CATHETERIZATION WITH [name];  Surgeon: [name], MD;  Location: [facility] CATH LAB;  Service: Cardiovascular;  Laterality: N/A;NSTEMI:side-to-side anastomosis w/the PDA is threatened(not amenable to PCI)in-stent restenosis  RCA graft,  50% osyial SVG to OM , LIMA to LAD patent w/ diffuse distal LAD disease/  severe 3V CAD w/ occlusion LAD, CFX, RCA, ef 45%  . OPEN BX LEFT CERVICAL MASS, POSTERIOR NECK  10/03/2004  . OPEN REDUCTION INTERNAL FIXATION (ORIF) DISTAL RADIAL FRACTURE Left 02/24/2016   Procedure: OPEN TREATMENT OF LEFT DISTAL RADIUS FRACTURE;  Surgeon: Milly Jakob, MD;  Location: [place];  Service: Orthopedics;  Laterality: Left;  . ORIF RIGHT ANKLE FX  10/07/2008  .  PERCUTANEOUS CORONARY STENT INTERVENTION (PCI-S) N/A 04/21/2014   Procedure: PERCUTANEOUS CORONARY STENT INTERVENTION (PCI-S);  Surgeon: [name], MD;  Location: [facility] CATH LAB;  Service: Cardiovascular;  Laterality: N/A;   BMS in the ostial/proximal SVG to CFX,  stable 50-70% ostial SVG to RCA (previous stenting)  . TEE WITHOUT CARDIOVERSION N/A 06/26/2016   Procedure: TRANSESOPHAGEAL ECHOCARDIOGRAM (TEE);  Surgeon: Burnell Blanks, MD;  Location: [place];  Service: Open Heart Surgery;  Laterality: N/A;  . THULIUM LASER TURP (TRANSURETHRAL RESECTION OF PROSTATE) N/A 12/05/2016   Procedure: THULIUM LASER TURP (TRANSURETHRAL RESECTION OF PROSTATE);  Surgeon: Nickie Retort, MD;  Location: [facility];  Service: Urology;  Laterality: N/A;  . TRANSCATHETER AORTIC VALVE REPLACEMENT, TRANSFEMORAL Bilateral 06/26/2016   Procedure: TRANSCATHETER AORTIC VALVE REPLACEMENT, TRANSFEMORAL;  Surgeon: Burnell Blanks, MD;  Location: [place];  Service: Open Heart Surgery;  Laterality: Bilateral;  . TRANSTHORACIC ECHOCARDIOGRAM  08/03/2016    post TAVR  ef 40-45%,  akinesis and scarring of the basal-inferolateral, inferior, and inferoseptal (consistent w/ infarction in distribution of the RCA), severe hypokinesis of the basal anteroseptal (consistent w/ ischemia in distribution of the LAD but w/ patent distal graft),  grade 2 diastolic dysfunction/  AV stent valve bioprosthesis normal function w/ trivial regurg (valve area 1.16cm^2)/  . TRANSTHORACIC ECHOCARDIOGRAM  08-03-2016  continued results- post TAVR   mild dilated ascending aorta/  mild to moderate MR, peak grandient 57mHg/  severe LAE and mild RAE/  mild PR/  trivial TR    Social History  reports that he quit smoking about 55 years ago. His smoking use included Cigarettes. He has a 7.50 pack-year smoking history. He has never used smokeless tobacco. He reports that he does not drink alcohol or use drugs.  No Known Allergies  Family History    Problem Relation Age of Onset  . Colon cancer Father   . Stroke Mother   . CAD Brother   . Stroke Sister   . Bone cancer Brother   . Parkinson's disease Brother   . Dementia Sister   . Stroke Sister      Prior to Admission medications   Medication Sig Start Date End Date Taking? Authorizing Provider  Ascorbic Acid (VITAMIN C) 1000 MG tablet Take 1,000 mg by mouth 3 (three) times a week. Has stopped prior to procedure    Historical Provider, MD  aspirin EC 81 MG tablet Take 81 mg by mouth daily. lst dose on 11/29/2016 per daughter, DJackelyn Poling   Historical Provider, MD  atorvastatin (LIPITOR) 40 MG tablet Take 40 mg by mouth daily at 6 PM.     Historical Provider, MD  B Complex-C (B-COMPLEX WITH VITAMIN C) tablet Take 1 tablet by mouth 3 (three) times a week. Has stopped prior to procedure    Historical Provider, MD  cephALEXin (KEFLEX) 500 MG capsule Take 1 capsule (500 mg total) by mouth 3 (three) times daily. 12/05/16   BNickie Retort MD  CHLOROPHYLL PO Take 2 tablets by mouth once a week. Has stopped prior to procedure    Historical Provider, MD  clopidogrel (PLAVIX) 75 MG tablet Take 1 tablet (75 mg total) by mouth daily. Patient taking differently: Take 75 mg by mouth daily. Last dose on 11/27/16 per daughter, dJackelyn Poling9/21/17   Dayna N Dunn, PA-C  finasteride (PROSCAR) 5 MG tablet Take 5 mg by mouth every morning.  07/04/15   Historical Provider, MD  folic acid (FOLVITE) 8482MCG tablet Take 800 mcg by mouth 3 (three) times a week. Has stopped prior to procedure    Historical Provider, MD  hydrochlorothiazide (MICROZIDE) 12.5 MG capsule TAKE 1 CAPSULE (12.5 MG TOTAL) BY MOUTH DAILY. Patient taking differently: TAKE 1 CAPSULE (12.5 MG TOTAL) BY MOUTH DAILY 07/12/16   HBelva Crome MD  HYDROcodone-acetaminophen ([facility] 5-325 MG tablet Take 1 tablet by mouth every 6 (six) hours as needed for moderate pain. 12/05/16   BNickie Retort MD  isosorbide mononitrate (IMDUR) 60 MG 24 hr tablet  Take 1.5 tablets (90 mg total) by mouth daily. Take one and a half (1.5) tablet (90 mg total) by mouth daily. Patient taking differently: Take 90 mg by mouth every morning. Take one and a half (1.5) tablet (90 mg total) by mouth daily. 07/09/16   Dayna N Dunn, PA-C  levothyroxine (SYNTHROID, LEVOTHROID) 125 MCG tablet Take 125 mcg by mouth daily before breakfast.  11/23/14   Historical Provider, MD  Magnesium  200 MG TABS Take 200 mg by mouth daily. Has stopped prior to procedure    Historical Provider, MD  metoprolol tartrate (LOPRESSOR) 25 MG tablet Take 0.5 tablets (12.5 mg total) by mouth 2 (two) times daily. Patient taking differently: Take 25 mg by mouth 2 (two) times daily. Per daughter Jackelyn Poling on 11/30/16- patient is taking 25 mg two times daily 06/28/16   Dayna N Dunn, PA-C  Multiple Vitamins-Iron (CHLORELLA PO) Take 4 tablets by mouth 2 (two) times daily. MED NAME: CHLORELLA    Historical Provider, MD  NAT-RUL PSYLLIUM SEED HUSKS PO Take 2 tablets by mouth once a week. Has stopped prior to procedure    Historical Provider, MD  nitroGLYCERIN (NITROSTAT) 0.4 MG SL tablet Place 1 tablet (0.4 mg total) under the tongue every 5 (five) minutes as needed for chest pain. 01/05/15   Belva Crome, MD  SELENIUM PO Take 1 tablet by mouth 3 (three) times a week. Has stopped prior to procedure    Historical Provider, MD  sulfamethoxazole-trimethoprim (BACTRIM DS,SEPTRA DS) 800-160 MG tablet Take 1 tablet by mouth 2 (two) times daily. 12/17/16 12/24/16  Malvin Johns, MD  tamsulosin (FLOMAX) 0.4 MG CAPS capsule Take 0.4 mg by mouth 2 (two) times daily. 05/01/16   Historical Provider, MD    Physical Exam: Vitals:   12/17/16 0830 12/17/16 0900 12/17/16 0930 12/17/16 1037  BP: (!) 101/50 (!) 102/52 (!) 112/52 (!) 107/46  Pulse: 66 69 61 61  Resp: 18   18  Temp:    98.9 F (37.2 C)  TempSrc:    Oral  SpO2: 95% 94% 96% 97%  Height:    5' 10"  (1.778 m)      Constitutional: Pleasant elderly male, moderately  built and overweight, lying comfortably propped up in the gurney in the ED without distress. Does not look septic or toxic. Eyes: PERTLA, lids and conjunctivae normal ENMT: Mucous membranes are slightly dry. Posterior pharynx clear of any exudate or lesions. Normal dentition.  Neck: supple, no masses, no thyromegaly Respiratory: clear to auscultation bilaterally, no wheezing, no crackles. Normal respiratory effort. No accessory muscle use. Midline sternotomy scar.  Cardiovascular: S1 & S2 heard, regular rate and rhythm, no murmurs / rubs / gallops. No extremity edema. 2+ pedal pulses. No carotid bruits.  Abdomen: No distension, no tenderness, no masses palpated. No hepatosplenomegaly. Bowel sounds normal.  Foley catheter with pink tinged urine/hematuria but much improved compared to prior.  Musculoskeletal: no clubbing / cyanosis. No joint deformity upper and lower extremities. Good ROM, no contractures. Normal muscle tone.  Skin: no rashes, lesions, ulcers. No induration Neurologic: CN 2-12 grossly intact. Sensation intact, DTR normal. Strength 5/5 in all 4 limbs.  Psychiatric: Normal judgment and insight. Alert and oriented x 3. Normal mood.     Labs on Admission: I have personally reviewed following labs and imaging studies  CBC:  Recent Labs Lab 12/16/16 2235  WBC 5.8  NEUTROABS 3.9  HGB 12.2*  HCT 36.9*  MCV 85.2  PLT 696*   Basic Metabolic Panel:  Recent Labs Lab 12/16/16 2235  NA 136  K 4.0  CL 104  CO2 26  GLUCOSE 106*  BUN 21*  CREATININE 1.54*  CALCIUM 8.6*   Urine analysis:    Component Value Date/Time   COLORURINE RED (A) 12/16/2016 2236   APPEARANCEUR TURBID (A) 12/16/2016 2236   LABSPEC  12/16/2016 2236    TEST NOT REPORTED DUE TO COLOR INTERFERENCE OF URINE PIGMENT   PHURINE  12/16/2016 2236    TEST NOT REPORTED DUE TO COLOR INTERFERENCE OF URINE PIGMENT   GLUCOSEU (A) 12/16/2016 2236    TEST NOT REPORTED DUE TO COLOR INTERFERENCE OF URINE PIGMENT    HGBUR (A) 12/16/2016 2236    TEST NOT REPORTED DUE TO COLOR INTERFERENCE OF URINE PIGMENT   BILIRUBINUR (A) 12/16/2016 2236    TEST NOT REPORTED DUE TO COLOR INTERFERENCE OF URINE PIGMENT   KETONESUR (A) 12/16/2016 2236    TEST NOT REPORTED DUE TO COLOR INTERFERENCE OF URINE PIGMENT   PROTEINUR (A) 12/16/2016 2236    TEST NOT REPORTED DUE TO COLOR INTERFERENCE OF URINE PIGMENT   UROBILINOGEN 0.2 06/01/2015 0850   NITRITE (A) 12/16/2016 2236    TEST NOT REPORTED DUE TO COLOR INTERFERENCE OF URINE PIGMENT   LEUKOCYTESUR (A) 12/16/2016 2236    TEST NOT REPORTED DUE TO COLOR INTERFERENCE OF URINE PIGMENT     Radiological Exams on Admission: Dg Chest 2 View  Result Date: 12/17/2016 CLINICAL DATA:  83 year old male with cough and fever. EXAM: CHEST  2 VIEW COMPARISON:  Chest radiograph dated 06/27/2016 FINDINGS: There is stable mild to moderate cardiomegaly. Median sternotomy changes, CABG vascular clips, and aortic valve replacement noted. There is no focal consolidation, pleural effusion, or pneumothorax. There is osteopenia with degenerative changes of the spine. No acute osseous pathology identified. IMPRESSION: No acute cardiopulmonary process. Stable mild to moderate cardiomegaly and postsurgical changes of CABG and aortic valve replacement. No vascular congestion or pulmonary edema. Electronically Signed   By: Anner Crete M.D.   On: 12/17/2016 02:05      Assessment/Plan Principal Problem:   Acute urinary retention Active Problems:   CAD (coronary artery disease) of artery bypass graft   HTN (hypertension)   Chronic combined systolic and diastolic HF (heart failure), NYHA class 3 (HCC)   Hyperlipidemia   S/P TAVR (transcatheter aortic valve replacement)   Thrombocytopenia (HCC)   Hypothyroidism   Hematuria, gross- s/p TURP ~ 12 days prior to admission.   Renal failure, acute on chronic (HCC)     1. Acute gross hematuria: In a patient status post recent thallium laser  ablation of prostate on 12/05/16 and Foley catheter removal 48 hours after procedure. Possibly bleeding from prostate bed versus secondary to UTI. Urology consultation appreciated and have changed urinary catheter to 24 French three-way catheter and initiated continuous bladder irrigation. Hematuria improved. I discussed with patient's primary Cardiologist Dr. Tamala Julian who advised that it was okay to temporarily hold patient's antiplatelets/aspirin & Plavix due to ongoing bleeding issues.Monitor CBCs closely. 2. Acute urinary retention: Secondary to problem #1. Management as above. Continue Flomax and finasteride. 3. Complicated UTI: Secondary to recent urinary interventions and catheterizations. Continue IV ceftriaxone pending urine culture results. 4. Acute on stage III chronic kidney disease: Last creatinine on 11/28/16:1.15. Presented with creatinine of 1.54 >likely secondary to obstruction/urinary retention and mildly prerenal. Urinary retention management as above. Brief and gentle IV fluids. Follow BMP in a.m. 5. CAD status post CABG and stents: As per patient and family, no intervention in greater than 1-2 years. Continue beta blockers and statins. Aspirin and Plavix temporarily held as discussed above. Dr. Tamala Julian indicated that he has not had a coronary stent in greater than 1 year and TAVR was greater than 3 months ago and hence okay to hold antiplatelets. 6. Essential hypertension: Controlled 7. Chronic combined systolic and diastolic CHF: Compensated or may be even slightly volume depleted. Monitor closely while being gently hydrated. 8. Hyperlipidemia:  Statins. 9. Status post AVR: 10. Chronic thrombocytopenia: Stable. 11. Hypothyroid: Synthroid.   DVT prophylaxis: SCD's  Code Status: Full  Family Communication: Discussed in detail with patient's daughter at bedside. Disposition Plan: DC home when medically improved and stable.  Consults called: Urology  Admission status: inpatient,  telemetry.    [facility] MD [facility] Pager [phone]  If 7PM-7AM, please contact night-coverage [email or web address] Password [facility]  12/17/2016, 10:48 AM

## 2016-12-17 NOTE — ED Notes (Signed)
3 way bladder irrigation set up by Dr. Burnett Harry.

## 2016-12-17 NOTE — ED Notes (Addendum)
Attempted to flush foley with 148ml of sterile water. Pulled a few large clots unable to pass the 120 ml of water out of catheter. Dr Dina Rich notified. Catheter removed and 20 french foley placed without incident. Dark red urine flowing at this time. When urine stopped, foley flushed and many large clots returned.   Wife and daughter at bedside watching how to flush catheter. When this RN ask family to  demonstrate how to flush the foley at home when needed, family stated they do not feel comfortable doing that at home.

## 2016-12-17 NOTE — ED Provider Notes (Addendum)
Patient signed out at end of shift pending x-ray and recheck given recurrent Foley catheter obstruction. Unfortunately, patient has had recurrent obstruction while the ER. I have instructed nursing to replace the Foley catheter with a larger Foley catheter. Upon replacement, patient now has a 20 [place] Foley catheter. He did have a partially 500 mL of output but again it was grossly bloody and the catheter again became clogged. He is on aspirin and Plavix. I have added a BMP. Creatinine is 1.5. Baseline 1.1.  The largest three-way catheter we have is an 45 [place] we do not have any catheter backs. I discussed this with the urology resident, Dr. Lenord Fellers.  He is requesting transfer to [facility] long hospital for evaluation. I discussed this with the family. They were updated at bedside. Will transfer to [facility] long for urology evaluation.   Merryl Hacker, MD 12/17/16 [phone]  Informed of temperature of 102. Patient is still clinically well appearing. Vital signs are reassuring. IV Rocephin ordered. Urinalysis was unable to be assessed previously but presumed urinary source.   Merryl Hacker, MD 12/17/16 [phone]

## 2016-12-17 NOTE — ED Notes (Signed)
Confirmed with EDP no blood cultures needed prior to admin of Rocephin.

## 2016-12-17 NOTE — ED Notes (Signed)
Urology consult in with the patient.

## 2016-12-17 NOTE — ED Provider Notes (Signed)
[facility] DEPT Provider Note   CSN: [id number] Arrival date & time: 12/16/16  1939     History   Chief Complaint Chief Complaint  Patient presents with  . Hematuria    HPI Tim Campbell is a 83 y.o. male presenting via [facility]link from [place] as a transfer with hematuria and clogged foley catheter. Patient is s/p Turp procedure 4/65/68 without complications until last night, when he was unable to void and noted clots in his foley. Attempts were made to replace the foley at [facility] and increased to 20Fr but it became clogged again. Urology was consulted (Dr. Lenord Fellers) and recommended transfer to [facility] for Urology eval. He is currently on plavix. He denies any pain, N/V or discomfort at this time.  HPI  Past Medical History:  Diagnosis Date  . AAA (abdominal aortic aneurysm) ([place])    a. 3.3x3.0cm by CT 05/2016.  Marland Kitchen BPH (benign prostatic hyperplasia)   . Chronic combined systolic and diastolic HF (heart failure), NYHA class 3   . Coronary artery disease CARDIOLOGIST-  DR Angelena Form   a. s/p CABGx5 in 1993. b. NSTEMI with occluded VG-diag in 2005. c. BMS to prox VG-PDA 2012. d. BMS to SVG-OM 03/2014. e. stable cath 05/2016.  . Diverticulosis of colon   . Heart murmur   . History of colon polyps    2002;  2007  . History of non-ST elevation myocardial infarction (NSTEMI)    x2 --- 12-29-2003  and  01-01-2013  . Hypertension   . Hypothyroidism   . Lower urinary tract symptoms (LUTS)   . Mixed hyperlipidemia   . Non Hodgkin's lymphoma ([place]) dx 2006 bone marrow bx--- onocologist-  dr Marin Olp   s/p  chemo and radiation therpay--- in remission since ---  . OA (osteoarthritis)   . Peripheral neuropathy ([place])   . Pulmonary nodules    a. by CT 05/2016  . RBBB (right bundle branch block)   . S/p bare metal coronary artery stent    10-16-2010  BMS to RCA graft  . S/P CABG x 5    05/ 1993  . 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 Urinary retention   .  Wears dentures    upper  . Wears glasses     Patient Active Problem List   Diagnosis Date Noted  . Pulmonary nodules 07/07/2016  . S/P TAVR (transcatheter aortic valve replacement) 06/28/2016  . Hypotension 06/28/2016  . Thrombocytopenia ([place]) 06/28/2016  . Hypothyroidism 06/28/2016  . RBBB 06/28/2016  . AAA (abdominal aortic aneurysm) ([place])   . Severe aortic valve stenosis 06/26/2016  . Postoperative wound infection 03/11/2016  . Preoperative cardiovascular examination 02/20/2016  . Hyperlipidemia 06/17/2014  . Chronic combined systolic and diastolic HF (heart failure), NYHA class 3 ([place]) 09/09/2013  . CAD (coronary artery disease) of artery bypass graft 01/03/2013  . HTN (hypertension) 01/03/2013  . Non-ST elevation MI (NSTEMI) ([place]) 01/02/2013    Class: Acute    Past Surgical History:  Procedure Laterality Date  . CARDIAC CATHETERIZATION N/A 05/17/2016   Procedure: Right/Left Heart Cath and Coronary/Graft Angiography;  Surgeon: Nelva Bush, MD;  Location: [place] [facility] CV LAB;  Service: Cardiovascular;  Laterality: N/A;  severe CAD,  total occlusion of mLAD, dLCx, and mRCA, 99% ostial LCX/ wide patent LIMA to LAD & SVG to OM, patent SVG to PDA/PL w/ stable 50% ISR at ostium SVG/ chronic occluded SVG to Diagonal/ severe AVS  . CARDIAC CATHETERIZATION  12-30-2003  dr Leonia Reeves  severe 3v cad:  occlused SVG to diagonal causing NSTEMI,  ef 45%  . CATARACT EXTRACTION W/ INTRAOCULAR LENS  IMPLANT, BILATERAL    . COLONOSCOPY    . CORONARY ANGIOPLASTY WITH STENT PLACEMENT  10-20-2010  dr Daneen Schick   BMS to ostium of the RCA graft:  total occlusion LAD, CFX, and RCA,  bypass graft 's-- total occlusion SVG to diagonal,  high-grade obstruction prox.Alfredo Batty segment of RCA graft,  mid SVG to OM 50% otherwise patent/  mild AVS inferior wall hypokinesis  . CORONARY ARTERY BYPASS GRAFT  05/ 1993     dr gerhart   LIMA to LAD,  SVG to D1,  SVT to RCA,  SVG to OM,  SVG to PDA  . [place]  . I&D EXTREMITY Left 03/09/2016   Procedure: IRRIGATION AND DEBRIDEMENT WRIST;  Surgeon: Milly Jakob, MD;  Location: [facility];  Service: Orthopedics;  Laterality: Left;  . INGUINAL HERNIA REPAIR Right 02/21/2006  . KNEE ARTHROSCOPY Right 1970'S   . LEFT AND RIGHT HEART CATHETERIZATION WITH CORONARY/GRAFT ANGIOGRAM N/A 04/05/2014   Procedure: LEFT AND RIGHT HEART CATHETERIZATION WITH [name];  Surgeon: Burnell Blanks, MD;  Location: [facility] CATH LAB;  Service: Cardiovascular;  Laterality: N/A;  . LEFT HEART CATHETERIZATION WITH CORONARY/GRAFT ANGIOGRAM N/A 01/02/2013   Procedure: LEFT HEART CATHETERIZATION WITH [name];  Surgeon: [name], MD;  Location: [facility] CATH LAB;  Service: Cardiovascular;  Laterality: N/A;NSTEMI:side-to-side anastomosis w/the PDA is threatened(not amenable to PCI)in-stent restenosis  RCA graft,  50% osyial SVG to OM , LIMA to LAD patent w/ diffuse distal LAD disease/  severe 3V CAD w/ occlusion LAD, CFX, RCA, ef 45%  . OPEN BX LEFT CERVICAL MASS, POSTERIOR NECK  10/03/2004  . OPEN REDUCTION INTERNAL FIXATION (ORIF) DISTAL RADIAL FRACTURE Left 02/24/2016   Procedure: OPEN TREATMENT OF LEFT DISTAL RADIUS FRACTURE;  Surgeon: Milly Jakob, MD;  Location: [facility];  Service: Orthopedics;  Laterality: Left;  . ORIF RIGHT ANKLE FX  10/07/2008  . PERCUTANEOUS CORONARY STENT INTERVENTION (PCI-S) N/A 04/21/2014   Procedure: PERCUTANEOUS CORONARY STENT INTERVENTION (PCI-S);  Surgeon: [name], MD;  Location: [facility] CATH LAB;  Service: Cardiovascular;  Laterality: N/A;   BMS in the ostial/proximal SVG to CFX,  stable 50-70% ostial SVG to RCA (previous stenting)  . TEE WITHOUT CARDIOVERSION N/A 06/26/2016   Procedure: TRANSESOPHAGEAL ECHOCARDIOGRAM (TEE);  Surgeon: Burnell Blanks, MD;  Location: [place];  Service: Open Heart Surgery;  Laterality: N/A;  . THULIUM LASER TURP (TRANSURETHRAL RESECTION OF PROSTATE) N/A 12/05/2016    Procedure: THULIUM LASER TURP (TRANSURETHRAL RESECTION OF PROSTATE);  Surgeon: Nickie Retort, MD;  Location: [facility];  Service: Urology;  Laterality: N/A;  . TRANSCATHETER AORTIC VALVE REPLACEMENT, TRANSFEMORAL Bilateral 06/26/2016   Procedure: TRANSCATHETER AORTIC VALVE REPLACEMENT, TRANSFEMORAL;  Surgeon: Burnell Blanks, MD;  Location: [facility];  Service: Open Heart Surgery;  Laterality: Bilateral;  . TRANSTHORACIC ECHOCARDIOGRAM  08/03/2016    post TAVR    ef 40-45%,  akinesis and scarring of the basal-inferolateral, inferior, and inferoseptal (consistent w/ infarction in distribution of the RCA), severe hypokinesis of the basal anteroseptal (consistent w/ ischemia in distribution of the LAD but w/ patent distal graft),  grade 2 diastolic dysfunction/  AV stent valve bioprosthesis normal function w/ trivial regurg (valve area 1.16cm^2)/  . TRANSTHORACIC ECHOCARDIOGRAM  08-03-2016  continued results- post TAVR   mild dilated ascending aorta/  mild to moderate MR, peak grandient 62mHg/  severe LAE and mild RAE/  mild PR/  trivial TR       Home Medications    Prior to Admission medications   Medication Sig Start Date End Date Taking? Authorizing Provider  Ascorbic Acid (VITAMIN C) 1000 MG tablet Take 1,000 mg by mouth 3 (three) times a week. Has stopped prior to procedure    Historical Provider, MD  aspirin EC 81 MG tablet Take 81 mg by mouth daily. lst dose on 11/29/2016 per daughter, Jackelyn Poling    Historical Provider, MD  atorvastatin (LIPITOR) 40 MG tablet Take 40 mg by mouth daily at 6 PM.     Historical Provider, MD  B Complex-C (B-COMPLEX WITH VITAMIN C) tablet Take 1 tablet by mouth 3 (three) times a week. Has stopped prior to procedure    Historical Provider, MD  cephALEXin (KEFLEX) 500 MG capsule Take 1 capsule (500 mg total) by mouth 3 (three) times daily. 12/05/16   Nickie Retort, MD  CHLOROPHYLL PO Take 2 tablets by mouth once a week. Has stopped prior to procedure     Historical Provider, MD  clopidogrel (PLAVIX) 75 MG tablet Take 1 tablet (75 mg total) by mouth daily. Patient taking differently: Take 75 mg by mouth daily. Last dose on 11/27/16 per daughter, Jackelyn Poling 06/28/16   Dayna N Dunn, PA-C  finasteride (PROSCAR) 5 MG tablet Take 5 mg by mouth every morning.  07/04/15   Historical Provider, MD  folic acid (FOLVITE) 213 MCG tablet Take 800 mcg by mouth 3 (three) times a week. Has stopped prior to procedure    Historical Provider, MD  hydrochlorothiazide (MICROZIDE) 12.5 MG capsule TAKE 1 CAPSULE (12.5 MG TOTAL) BY MOUTH DAILY. Patient taking differently: TAKE 1 CAPSULE (12.5 MG TOTAL) BY MOUTH DAILY 07/12/16   Belva Crome, MD  HYDROcodone-acetaminophen [facility]) 5-325 MG tablet Take 1 tablet by mouth every 6 (six) hours as needed for moderate pain. 12/05/16   Nickie Retort, MD  isosorbide mononitrate (IMDUR) 60 MG 24 hr tablet Take 1.5 tablets (90 mg total) by mouth daily. Take one and a half (1.5) tablet (90 mg total) by mouth daily. Patient taking differently: Take 90 mg by mouth every morning. Take one and a half (1.5) tablet (90 mg total) by mouth daily. 07/09/16   Dayna N Dunn, PA-C  levothyroxine (SYNTHROID, LEVOTHROID) 125 MCG tablet Take 125 mcg by mouth daily before breakfast.  11/23/14   Historical Provider, MD  Magnesium 200 MG TABS Take 200 mg by mouth daily. Has stopped prior to procedure    Historical Provider, MD  metoprolol tartrate (LOPRESSOR) 25 MG tablet Take 0.5 tablets (12.5 mg total) by mouth 2 (two) times daily. Patient taking differently: Take 25 mg by mouth 2 (two) times daily. Per daughter Jackelyn Poling on 11/30/16- patient is taking 25 mg two times daily 06/28/16   Dayna N Dunn, PA-C  Multiple Vitamins-Iron (CHLORELLA PO) Take 4 tablets by mouth 2 (two) times daily. MED NAME: CHLORELLA    Historical Provider, MD  NAT-RUL PSYLLIUM SEED HUSKS PO Take 2 tablets by mouth once a week. Has stopped prior to procedure    Historical Provider, MD    nitroGLYCERIN (NITROSTAT) 0.4 MG SL tablet Place 1 tablet (0.4 mg total) under the tongue every 5 (five) minutes as needed for chest pain. 01/05/15   Belva Crome, MD  SELENIUM PO Take 1 tablet by mouth 3 (three) times a week. Has stopped prior to procedure    Historical Provider, MD  sulfamethoxazole-trimethoprim (BACTRIM DS,SEPTRA DS) 800-160 MG tablet Take 1 tablet by mouth 2 (two) times daily. 12/17/16 12/24/16  Malvin Johns, MD  tamsulosin (FLOMAX) 0.4 MG CAPS capsule Take 0.4 mg by mouth 2 (two) times daily. 05/01/16   Historical Provider, MD    Family History Family History  Problem Relation Age of Onset  . Colon cancer Father   . Stroke Mother   . CAD Brother   . Stroke Sister   . Bone cancer Brother   . Parkinson's disease Brother   . Dementia Sister   . Stroke Sister     Social History Social History  Substance Use Topics  . Smoking status: Former Smoker    Packs/day: 0.50    Years: 15.00    Types: Cigarettes    Quit date: 06/22/1961  . Smokeless tobacco: Never Used  . Alcohol use No     Allergies   Patient has no known allergies.   Review of Systems Review of Systems  Constitutional: Positive for fever. Negative for chills.  HENT: Negative for congestion, ear pain and sore throat.   Eyes: Negative for pain and visual disturbance.  Respiratory: Negative for cough, chest tightness, shortness of breath, wheezing and stridor.   Cardiovascular: Negative for chest pain, palpitations and leg swelling.  Gastrointestinal: Negative for abdominal distention, abdominal pain and nausea.  Genitourinary: Positive for decreased urine volume, difficulty urinating and hematuria. Negative for flank pain, penile pain, penile swelling, scrotal swelling and testicular pain.  Musculoskeletal: Negative for arthralgias and back pain.  Skin: Negative for color change, pallor and rash.  Neurological: Negative for dizziness, seizures, syncope and weakness.  All other systems reviewed  and are negative.    Physical Exam Updated Vital Signs BP 119/69 (BP Location: Left Arm)   Pulse 77   Temp 100.8 F (38.2 C) (Oral)   Resp 18   SpO2 96%   Physical Exam  Constitutional: He is oriented to person, place, and time. He appears well-developed and well-nourished. No distress.  Patient is now afebrile, non-toxic and well-appearing lying comfortably in bed.  HENT:  Head: Normocephalic and atraumatic.  Eyes: Conjunctivae and EOM are normal.  Neck: Normal range of motion.  Cardiovascular: Normal rate and regular rhythm.   No murmur heard. Pulmonary/Chest: Effort normal and breath sounds normal. No respiratory distress. He has no wheezes.  Abdominal: Soft. Bowel sounds are normal. He exhibits no distension. There is no tenderness. There is no rebound and no guarding.  Genitourinary:  Genitourinary Comments: Blood from the meatus.  Bloody urine in foley bag  Musculoskeletal: He exhibits no edema.  Neurological: He is alert and oriented to person, place, and time.  Skin: Skin is warm and dry. He is not diaphoretic. No erythema. No pallor.  Psychiatric: He has a normal mood and affect. His behavior is normal.  Nursing note and vitals reviewed.    ED Treatments / Results  Labs (all labs ordered are listed, but only abnormal results are displayed) Labs Reviewed  URINALYSIS, ROUTINE W REFLEX MICROSCOPIC - Abnormal; Notable for the following:       Result Value   Color, Urine RED (*)    APPearance TURBID (*)    Glucose, UA   (*)    Value: TEST NOT REPORTED DUE TO COLOR INTERFERENCE OF URINE PIGMENT   Hgb urine dipstick   (*)    Value: TEST NOT REPORTED DUE TO COLOR INTERFERENCE OF URINE PIGMENT   Bilirubin Urine   (*)    Value:  TEST NOT REPORTED DUE TO COLOR INTERFERENCE OF URINE PIGMENT   Ketones, ur   (*)    Value: TEST NOT REPORTED DUE TO COLOR INTERFERENCE OF URINE PIGMENT   Protein, ur   (*)    Value: TEST NOT REPORTED DUE TO COLOR INTERFERENCE OF URINE PIGMENT    Nitrite   (*)    Value: TEST NOT REPORTED DUE TO COLOR INTERFERENCE OF URINE PIGMENT   Leukocytes, UA   (*)    Value: TEST NOT REPORTED DUE TO COLOR INTERFERENCE OF URINE PIGMENT   All other components within normal limits  URINALYSIS, MICROSCOPIC (REFLEX) - Abnormal; Notable for the following:    Bacteria, UA MANY (*)    Squamous Epithelial / LPF 0-5 (*)    All other components within normal limits  URINALYSIS, ROUTINE W REFLEX MICROSCOPIC - Abnormal; Notable for the following:    Color, Urine RED (*)    APPearance TURBID (*)    Glucose, UA   (*)    Value: TEST NOT REPORTED DUE TO COLOR INTERFERENCE OF URINE PIGMENT   Hgb urine dipstick   (*)    Value: TEST NOT REPORTED DUE TO COLOR INTERFERENCE OF URINE PIGMENT   Bilirubin Urine   (*)    Value: TEST NOT REPORTED DUE TO COLOR INTERFERENCE OF URINE PIGMENT   Ketones, ur   (*)    Value: TEST NOT REPORTED DUE TO COLOR INTERFERENCE OF URINE PIGMENT   Protein, ur   (*)    Value: TEST NOT REPORTED DUE TO COLOR INTERFERENCE OF URINE PIGMENT   Nitrite   (*)    Value: TEST NOT REPORTED DUE TO COLOR INTERFERENCE OF URINE PIGMENT   Leukocytes, UA   (*)    Value: TEST NOT REPORTED DUE TO COLOR INTERFERENCE OF URINE PIGMENT   All other components within normal limits  CBC WITH DIFFERENTIAL/PLATELET - Abnormal; Notable for the following:    Hemoglobin 12.2 (*)    HCT 36.9 (*)    Platelets 135 (*)    All other components within normal limits  URINALYSIS, MICROSCOPIC (REFLEX) - Abnormal; Notable for the following:    Bacteria, UA FEW (*)    Squamous Epithelial / LPF 0-5 (*)    All other components within normal limits  BASIC METABOLIC PANEL - Abnormal; Notable for the following:    Glucose, Bld 106 (*)    BUN 21 (*)    Creatinine, Ser 1.54 (*)    Calcium 8.6 (*)    GFR calc non Af Amer 39 (*)    GFR calc Af Amer 45 (*)    All other components within normal limits  URINE CULTURE    EKG  EKG Interpretation None        Radiology Dg Chest 2 View  Result Date: 12/17/2016 CLINICAL DATA:  83 year old male with cough and fever. EXAM: CHEST  2 VIEW COMPARISON:  Chest radiograph dated 06/27/2016 FINDINGS: There is stable mild to moderate cardiomegaly. Median sternotomy changes, CABG vascular clips, and aortic valve replacement noted. There is no focal consolidation, pleural effusion, or pneumothorax. There is osteopenia with degenerative changes of the spine. No acute osseous pathology identified. IMPRESSION: No acute cardiopulmonary process. Stable mild to moderate cardiomegaly and postsurgical changes of CABG and aortic valve replacement. No vascular congestion or pulmonary edema. Electronically Signed   By: Anner Crete M.D.   On: 12/17/2016 02:05    Procedures Procedures (including critical care time)  Medications Ordered in ED Medications  sulfamethoxazole-trimethoprim (BACTRIM  DS,SEPTRA DS) 800-160 MG per tablet 1 tablet (1 tablet Oral Given 12/17/16 0050)  cefTRIAXone (ROCEPHIN) 1 g in dextrose 5 % 50 mL IVPB (0 g Intravenous Stopped 12/17/16 0618)  acetaminophen (TYLENOL) 500 MG tablet (1,000 mg  Given 12/17/16 0541)     Initial Impression / Assessment and Plan / ED Course  I have reviewed the triage vital signs and the nursing notes.  Pertinent labs & imaging results that were available during my care of the patient were reviewed by me and considered in my medical decision making (see chart for details).     Patient presents as a transfer [facility] with gross hematuria and clogged Foley catheter. He is here for urology evaluation and admission. On my assessment he is well appearing lying comfortably in bed and has no complaints. Patient was initially febrile on arrival presumed from UTI. Although urine analysis was inconclusive due to severe hematuria. Ceftriaxone was already given prior to arrival. Chest x-ray negative. Blood noted at the meatus. Red/pink color noted in the Foley  catheter bag.  Urology consult placed. 07:15-Urology here to see him. 08:06- spoke to urology and they placed a 24 cath in with continuous irrigation at this time at medium drip with salmon colored urine. They recommend admission and will be following up. Put in for urine culture of their sample from the morning cath exchange. Placed consult to hospitalist for admission.  08:45- Spoke to admitting physician and he will be admitted to telemetry.  Final Clinical Impressions(s) / ED Diagnoses   Final diagnoses:  Gross hematuria  Urinary retention    New Prescriptions New Prescriptions   SULFAMETHOXAZOLE-TRIMETHOPRIM (BACTRIM DS,SEPTRA DS) 800-160 MG TABLET    Take 1 tablet by mouth 2 (two) times daily.     Emeline General, PA-C 12/17/16 [place], MD 12/17/16 [phone]

## 2016-12-17 NOTE — ED Notes (Signed)
Patient transported to X-ray 

## 2016-12-17 NOTE — ED Notes (Signed)
Foley irrigated with 400 ml sterile water. Able to instill approx 50 ml at a time and pull back only what was instilled. Urine not draining otherwise. No further clots noted around meatus, but small clots noted in bloody urine. Pt tolerated this well.

## 2016-12-18 LAB — URINALYSIS, ROUTINE W REFLEX MICROSCOPIC
Bilirubin Urine: NEGATIVE
GLUCOSE, UA: NEGATIVE mg/dL
KETONES UR: NEGATIVE mg/dL
NITRITE: NEGATIVE
PH: 5 (ref 5.0–8.0)
Protein, ur: NEGATIVE mg/dL
Specific Gravity, Urine: 1.008 (ref 1.005–1.030)
Squamous Epithelial / LPF: NONE SEEN

## 2016-12-18 LAB — CBC
HCT: 35.8 % — ABNORMAL LOW (ref 39.0–52.0)
Hemoglobin: 11.6 g/dL — ABNORMAL LOW (ref 13.0–17.0)
MCH: 26.8 pg (ref 26.0–34.0)
MCHC: 32.4 g/dL (ref 30.0–36.0)
MCV: 82.7 fL (ref 78.0–100.0)
PLATELETS: 127 10*3/uL — AB (ref 150–400)
RBC: 4.33 MIL/uL (ref 4.22–5.81)
RDW: 15 % (ref 11.5–15.5)
WBC: 7.3 10*3/uL (ref 4.0–10.5)

## 2016-12-18 LAB — BASIC METABOLIC PANEL
Anion gap: 7 (ref 5–15)
BUN: 22 mg/dL — AB (ref 6–20)
CALCIUM: 8.4 mg/dL — AB (ref 8.9–10.3)
CO2: 24 mmol/L (ref 22–32)
CREATININE: 0.99 mg/dL (ref 0.61–1.24)
Chloride: 105 mmol/L (ref 101–111)
GFR calc non Af Amer: >60 mL/min (ref >60)
Glucose, Bld: 102 mg/dL — ABNORMAL HIGH (ref 65–99)
Potassium: 4.1 mmol/L (ref 3.5–5.1)
SODIUM: 136 mmol/L (ref 135–145)

## 2016-12-18 LAB — URINE CULTURE: Culture: NO GROWTH

## 2016-12-18 MED ORDER — ISOSORBIDE MONONITRATE ER 30 MG PO TB24
30.0000 mg | ORAL_TABLET | Freq: Every morning | ORAL | Status: DC
  Administered 2016-12-19: 30 mg via ORAL
  Filled 2016-12-18: qty 1

## 2016-12-18 MED ORDER — METOPROLOL TARTRATE 25 MG PO TABS
12.5000 mg | ORAL_TABLET | Freq: Two times a day (BID) | ORAL | Status: DC
  Administered 2016-12-18 – 2016-12-19 (×2): 12.5 mg via ORAL
  Filled 2016-12-18 (×2): qty 1

## 2016-12-18 NOTE — Consult Note (Signed)
Urology Consult Note   Requesting Attending Physician:  Florencia Reasons, MD Service Providing Consult: Urology Consulting Attending: Tresa Moore  Assessment:  Patient is a 83 y.o. male with medical history per HPI (significant for extensive cardiac disease on ASA/plavix) who is s/p thulium laser ablation prostate 12/05/16 with Dr. Pilar Jarvis who presents with gross hematuria, clot retention, and fever to 102.6. Catheter exchanged by urology to a 24Fr 3-way hematuria catheter with initiation of CBI.   Recommendations: 1. CBI clamped this morning. OK to disconnect to ambulate and be OOB 2. Continue Foley catheter today. Favor staying inpatient until 3/14 to monitor gross hematuria and follow up urine cultures. If he does well off CBI today will consider TOV tomorrow 3. Continue CTX. Follow up cultures 4. OK to continue ASA 81. Please hold plavix if possible 5. Continue Flomax/finasteride  Thank you for this consult. Please contact the urology consult pager with any further questions/concerns. Sharmaine Base, MD Urology Surgical Resident  Subjective No n/v. Good night. Urine light salmon colored on slow drip CBI this morning. Tolerating diet. Ambulated/OOB. No pain.   Objective   Vital signs in last 24 hours: BP (!) 119/48 (BP Location: Right Arm)   Pulse 62   Temp 99.4 F (37.4 C) (Axillary)   Resp 18   Ht 5\' 10"  (1.778 m)   Wt 95.5 kg (210 lb 8 oz)   SpO2 97%   BMI 30.20 kg/m   Intake/Output last 3 shifts: I/O last 3 completed shifts: In: 9920 [P.O.:720; I.V.:300; WGNFA:2130; IV Piggyback:50] Out: 86578 [Urine:11250]  Physical Exam General: NAD, A&O, resting, appropriate HEENT: [place]/AT, EOMI, MMM Pulmonary: Normal work of breathing on RA Cardiovascular: Regular rate & rhythm, HDS, adequate peripheral perfusion Abdomen: soft, NTTP, nondistended, no suprapubic fullness or tenderness GU: 24Fr 3-way hematuria catheter draining light salmon colored urine with no clot burden on slow drip CBI, no  CVA tenderness, circumcised male phallus DRE: deferred Extremities: warm and well perfused, no edema Neuro: Appropriate, no focal neurological deficits  Most Recent Labs: Lab Results  Component Value Date   WBC 6.1 12/17/2016   HGB 12.3 (L) 12/17/2016   HCT 37.1 (L) 12/17/2016   PLT 121 (L) 12/17/2016    Lab Results  Component Value Date   NA 136 12/16/2016   K 4.0 12/16/2016   CL 104 12/16/2016   CO2 26 12/16/2016   BUN 21 (H) 12/16/2016   CREATININE 1.54 (H) 12/16/2016   CALCIUM 8.6 (L) 12/16/2016   MG 2.0 06/27/2016    Lab Results  Component Value Date   ALKPHOS 54 06/22/2016   BILITOT 0.9 06/22/2016   PROT 6.1 (L) 06/22/2016   ALBUMIN 3.7 06/22/2016   ALT 13 (L) 06/22/2016   AST 21 06/22/2016    Lab Results  Component Value Date   INR 1.37 06/26/2016   APTT 40 (H) 06/26/2016     Urine Culture: Pending   IMAGING: Dg Chest 2 View  Result Date: 12/17/2016 CLINICAL DATA:  83 year old male with cough and fever. EXAM: CHEST  2 VIEW COMPARISON:  Chest radiograph dated 06/27/2016 FINDINGS: There is stable mild to moderate cardiomegaly. Median sternotomy changes, CABG vascular clips, and aortic valve replacement noted. There is no focal consolidation, pleural effusion, or pneumothorax. There is osteopenia with degenerative changes of the spine. No acute osseous pathology identified. IMPRESSION: No acute cardiopulmonary process. Stable mild to moderate cardiomegaly and postsurgical changes of CABG and aortic valve replacement. No vascular congestion or pulmonary edema. Electronically Signed   By: Milas Hock  Radparvar M.D.   On: 12/17/2016 02:05    PROCEDURES: Existing catheter was removed after removing sterile water from catheter balloon. The patient was prepped with betadine and draped in usual fashion for Foley placement. A 24Fr 3-way hematuria catheter was advanced into the bladder without difficulty. 30cc sterile water was placed in the balloon. This was gently brought  back to the bladder neck. The bladder was flushed with about 600cc sterile water with titration of urine down to a watermelon color and removal of minimal small clot burden (<5cc). The catheter was attached to CBI which was run at a medium drip with production of light watermelon tinged urine. The catheter was attached to a standing gravity bag.

## 2016-12-18 NOTE — Plan of Care (Signed)
Problem: Safety: Goal: Ability to remain free from injury will improve Outcome: Completed/Met Date Met: 12/18/16 Discussed safety prevention plan. Bed alarm set

## 2016-12-18 NOTE — Progress Notes (Signed)
PROGRESS NOTE  Tim Campbell NAT:[id number] DOB: 08/31/1926 DOA: 12/16/2016 PCP: Tim Screws, MD  HPI/Recap of past 24 hours:  tmax 102 on presentation, fever subsided Denies pain,  no edema Dark urine in foley,  Assessment/Plan: Principal Problem:   Acute urinary retention Active Problems:   CAD (coronary artery disease) of artery bypass graft   HTN (hypertension)   Chronic combined systolic and diastolic HF (heart failure), NYHA class 3 (HCC)   Hyperlipidemia   S/P TAVR (transcatheter aortic valve replacement)   Thrombocytopenia (HCC)   Hypothyroidism   Hematuria, gross- s/p TURP ~ 12 days prior to admission.   Renal failure, acute on chronic (HCC)  Acute gross hematuria/acute urinary retention/complicated UTI/AKI: In a patient status post recent thallium laser ablation of prostate on 12/05/16 and Foley catheter removal 48 hours after procedure. Possibly bleeding from prostate bed versus secondary to UTI. Urology consultation appreciated and have changed urinary catheter to 24 French three-way catheter and initiated continuous bladder irrigation. Dr Tim Campbell  discussed with patient's primary Cardiologist Dr. Tamala Campbell who advised that it was okay to temporarily hold patient's antiplatelets/aspirin & Plavix due to ongoing bleeding issues.Monitor CBCs closely. Urine culture pending, continue rocephin, he received gentle hydration  HTN, bp low normal, will decrease imdur from 90mg  daily to 30mg  daily, decrease lopressor from 25mg  bid to 12.5 mg bid , both with holding parameters  CAD status post CABG and stents, Status post AVR::  Continue beta blockers and statins. Aspirin and Plavix temporarily held as discussed above. Dr. Tamala Campbell indicated that he has not had a coronary stent in greater than 1 year and TAVR was greater than 3 months ago and hence okay to hold antiplatelets.  Chronic combined systolic and diastolic CHF: last ef 42-70%. Compensated or may be even slightly volume  depleted. Monitor closely while being gently hydrated. Home meds HCTZ held since admission  Hyperlipidemia: Statins.  Chronic thrombocytopenia: mild, plt 120-130, Stable.  Hypothyroid: Synthroid.   DVT prophylaxis: SCD's  Code Status: Full  Family Communication: patient Disposition Plan: home in 1-2 days with urology clearance, pending urine culture Consults called: Urology    Procedures:  bladder irrigation  Antibiotics:  Rocephin from admission   Objective: BP (!) 107/50   Pulse (!) 55   Temp 99.4 F (37.4 C) (Axillary)   Resp 18   Ht 5\' 10"  (1.778 m)   Wt 95.5 kg (210 lb 8 oz)   SpO2 97%   BMI 30.20 kg/m   Intake/Output Summary (Last 24 hours) at 12/18/16 1130 Last data filed at 12/18/16 0955  Gross per 24 hour  Intake             9410 ml  Output             8900 ml  Net              510 ml   Filed Weights   12/17/16 1500 12/18/16 0644  Weight: 97.8 kg (215 lb 9.8 oz) 95.5 kg (210 lb 8 oz)    Exam:   General:  NAD  Cardiovascular: RRR, Midline sternotomy scar.   Respiratory: CTABL, mild wheezing vs upper airway sounds, (on room air, cxr unremarkable,  continue monitor)  Abdomen: Soft/ND/NT, positive BS  Musculoskeletal: No Edema  Neuro: aaox3  Data Reviewed: Basic Metabolic Panel:  Recent Labs Lab 12/16/16 2235 12/18/16 0616  NA 136 136  K 4.0 4.1  CL 104 105  CO2 26 24  GLUCOSE 106* 102*  BUN 21*  22*  CREATININE 1.54* 0.99  CALCIUM 8.6* 8.4*   Liver Function Tests: No results for input(s): AST, ALT, ALKPHOS, BILITOT, PROT, ALBUMIN in the last 168 hours. No results for input(s): LIPASE, AMYLASE in the last 168 hours. No results for input(s): AMMONIA in the last 168 hours. CBC:  Recent Labs Lab 12/16/16 2235 12/17/16 1651 12/18/16 0616  WBC 5.8 6.1 7.3  NEUTROABS 3.9  --   --   HGB 12.2* 12.3* 11.6*  HCT 36.9* 37.1* 35.8*  MCV 85.2 83.9 82.7  PLT 135* 121* 127*   Cardiac Enzymes:   No results for input(s):  CKTOTAL, CKMB, CKMBINDEX, TROPONINI in the last 168 hours. BNP (last 3 results) No results for input(s): BNP in the last 8760 hours.  ProBNP (last 3 results) No results for input(s): PROBNP in the last 8760 hours.  CBG: No results for input(s): GLUCAP in the last 168 hours.  Recent Results (from the past 240 hour(s))  Urine culture     Status: None   Collection Time: 12/16/16 11:36 PM  Result Value Ref Range Status   Specimen Description URINE, CATHETERIZED  Final   Special Requests NONE  Final   Culture   Final    NO GROWTH Performed at [facility], [address] [place], [place] [address]    Report Status 12/18/2016 FINAL  Final     Studies: No results found.  Scheduled Meds: . atorvastatin  40 mg Oral q1800  . cefTRIAXone (ROCEPHIN)  IV  1 g Intravenous Q24H  . finasteride  5 mg Oral q morning - 10a  . [START ON 12/19/2016] isosorbide mononitrate  30 mg Oral q morning - 10a  . levothyroxine  125 mcg Oral QAC breakfast  . magnesium oxide  400 mg Oral Daily  . metoprolol tartrate  12.5 mg Oral BID  . tamsulosin  0.4 mg Oral BID    Continuous Infusions:   Time spent: 47mins  Tim Silveria MD, PhD  [facility] Pager [phone]. If 7PM-7AM, please contact night-coverage at [email or web address], password [facility] 12/18/2016, 11:30 AM  LOS: 1 day

## 2016-12-18 NOTE — Plan of Care (Signed)
Problem: Pain Managment: Goal: General experience of comfort will improve Outcome: Completed/Met Date Met: 12/18/16 Patient reports needs being met   

## 2016-12-19 DIAGNOSIS — I25708 Atherosclerosis of coronary artery bypass graft(s), unspecified, with other forms of angina pectoris: Secondary | ICD-10-CM

## 2016-12-19 DIAGNOSIS — E039 Hypothyroidism, unspecified: Secondary | ICD-10-CM

## 2016-12-19 DIAGNOSIS — N189 Chronic kidney disease, unspecified: Secondary | ICD-10-CM

## 2016-12-19 DIAGNOSIS — Z952 Presence of prosthetic heart valve: Secondary | ICD-10-CM

## 2016-12-19 LAB — TSH: TSH: 2.441 u[IU]/mL (ref 0.350–4.500)

## 2016-12-19 LAB — BASIC METABOLIC PANEL
ANION GAP: 7 (ref 5–15)
BUN: 16 mg/dL (ref 6–20)
CHLORIDE: 101 mmol/L (ref 101–111)
CO2: 24 mmol/L (ref 22–32)
Calcium: 8.2 mg/dL — ABNORMAL LOW (ref 8.9–10.3)
Creatinine, Ser: 0.96 mg/dL (ref 0.61–1.24)
GFR calc non Af Amer: >60 mL/min (ref >60)
Glucose, Bld: 89 mg/dL (ref 65–99)
POTASSIUM: 3.6 mmol/L (ref 3.5–5.1)
SODIUM: 132 mmol/L — AB (ref 135–145)

## 2016-12-19 LAB — CBC WITH DIFFERENTIAL/PLATELET
BASOS ABS: 0 10*3/uL (ref 0.0–0.1)
BASOS PCT: 0 %
EOS ABS: 0.1 10*3/uL (ref 0.0–0.7)
Eosinophils Relative: 1 %
HEMATOCRIT: 37.5 % — AB (ref 39.0–52.0)
HEMOGLOBIN: 12.3 g/dL — AB (ref 13.0–17.0)
LYMPHS ABS: 1.8 10*3/uL (ref 0.7–4.0)
LYMPHS PCT: 31 %
MCH: 27.5 pg (ref 26.0–34.0)
MCHC: 32.8 g/dL (ref 30.0–36.0)
MCV: 83.9 fL (ref 78.0–100.0)
MONOS PCT: 8 %
Monocytes Absolute: 0.4 10*3/uL (ref 0.1–1.0)
Neutro Abs: 3.5 10*3/uL (ref 1.7–7.7)
Neutrophils Relative %: 60 %
Platelets: 113 10*3/uL — ABNORMAL LOW (ref 150–400)
RBC: 4.47 MIL/uL (ref 4.22–5.81)
RDW: 15.2 % (ref 11.5–15.5)
WBC: 5.7 10*3/uL (ref 4.0–10.5)

## 2016-12-19 LAB — MAGNESIUM: MAGNESIUM: 1.9 mg/dL (ref 1.7–2.4)

## 2016-12-19 MED ORDER — ISOSORBIDE MONONITRATE ER 30 MG PO TB24: 30.0000 mg | ORAL_TABLET | Freq: Every morning | ORAL | 0 refills | Status: DC

## 2016-12-19 MED ORDER — CLOPIDOGREL BISULFATE 75 MG PO TABS: 75.0000 mg | ORAL_TABLET | Freq: Every day | ORAL | 0 refills | Status: DC

## 2016-12-19 MED ORDER — CIPROFLOXACIN HCL 250 MG PO TABS: 250.0000 mg | ORAL_TABLET | Freq: Two times a day (BID) | ORAL | 0 refills | Status: DC

## 2016-12-19 NOTE — Consult Note (Signed)
Urology Consult Note   Requesting Attending Physician:  Kerney Elbe, DO Service Providing Consult: Urology Consulting Attending: Pilar Jarvis  Assessment:  Patient is a 83 y.o. male with medical history per HPI (significant for extensive cardiac disease on ASA/plavix) who is s/p thulium laser ablation prostate 12/05/16 with Dr. Pilar Jarvis who presents with gross hematuria, clot retention, and fever to 102.6. Catheter exchanged by urology to a 24Fr 3-way hematuria catheter with initiation of CBI.   Interval: Urine clear off CBI all day yesterday. T max 100.5. Soft BP but stable. NAEON. 4.9L UOP recorded off CBI. Cr 0.96. Hb stable. Urine culture (first of 2 sent from Er) no growth.   Recommendations: 1. Discontinue Foley. TOV today. PVR x 1 with first void to ensure adequate emptying.  2. Would favor 7d course of antibiotics for UTI despite first of two cultures being no growth from Er. Follow up 2nd urine culture 3. OK to continue ASA 81. Please hold plavix if possible today. Will likely be able to continue plavix as early as tomorrow if passing TOV with no issues with hematuria 4. Continue Flomax/finasteride  Thank you for this consult. Please contact the urology consult pager with any further questions/concerns. Sharmaine Base, MD Urology Surgical Resident  Subjective NAEON. No pain. No n/v. Tolerating diet. OOB. Does have a runny nose, some upper respiratory symptoms (present prior to Er).  Objective   Vital signs in last 24 hours: BP (!) 102/55   Pulse 71   Temp 97.4 F (36.3 C) (Oral)   Resp 18   Ht 5\' 10"  (1.778 m)   Wt 95.2 kg (209 lb 14.4 oz)   SpO2 97%   BMI 30.12 kg/m   Intake/Output last 3 shifts: I/O last 3 completed shifts: In: 5009 [P.O.:1080; Other:4300; IV Piggyback:100] Out: 10350 [Urine:10350]  Physical Exam General: NAD, A&O, resting, appropriate HEENT: Ray/AT, EOMI, MMM Pulmonary: Normal work of breathing on RA Cardiovascular: Regular rate & rhythm, HDS,  adequate peripheral perfusion Abdomen: soft, NTTP, nondistended, no suprapubic fullness or tenderness GU: 24Fr 3-way hematuria catheter draining clear yellow urine off CBI, no CVA tenderness DRE: deferred Extremities: warm and well perfused, no edema Neuro: Appropriate, no focal neurological deficits  Most Recent Labs: Lab Results  Component Value Date   WBC 5.7 12/19/2016   HGB 12.3 (L) 12/19/2016   HCT 37.5 (L) 12/19/2016   PLT 113 (L) 12/19/2016    Lab Results  Component Value Date   NA 132 (L) 12/19/2016   K 3.6 12/19/2016   CL 101 12/19/2016   CO2 24 12/19/2016   BUN 16 12/19/2016   CREATININE 0.96 12/19/2016   CALCIUM 8.2 (L) 12/19/2016   MG 1.9 12/19/2016    Lab Results  Component Value Date   ALKPHOS 54 06/22/2016   BILITOT 0.9 06/22/2016   PROT 6.1 (L) 06/22/2016   ALBUMIN 3.7 06/22/2016   ALT 13 (L) 06/22/2016   AST 21 06/22/2016    Lab Results  Component Value Date   INR 1.37 06/26/2016   APTT 40 (H) 06/26/2016     Urine Culture: Pending   IMAGING: No results found.  PROCEDURES: Existing catheter was removed after removing sterile water from catheter balloon. The patient was prepped with betadine and draped in usual fashion for Foley placement. A 24Fr 3-way hematuria catheter was advanced into the bladder without difficulty. 30cc sterile water was placed in the balloon. This was gently brought back to the bladder neck. The bladder was flushed with about 600cc sterile water  with titration of urine down to a watermelon color and removal of minimal small clot burden (<5cc). The catheter was attached to CBI which was run at a medium drip with production of light watermelon tinged urine. The catheter was attached to a standing gravity bag.

## 2016-12-19 NOTE — Discharge Summary (Signed)
Physician Discharge Summary  Tim Campbell MWN:[id number] DOB: [birth year] DOA: 12/16/2016  PCP: [name], MD  Admit date: 12/16/2016 Discharge date: 12/19/2016  Admitted From: Home Disposition:  Home  Recommendations for Outpatient Follow-up:  1. Follow up with PCP in 1-2 weeks 2. Follow up with [facility] Dr. Pilar Jarvis as an outpatient within 1-2 weeks 3. Follow up with Cardiology Dr. Daneen Schick as an outpatient in 1-2 weeks 4. Please obtain BMP/CBC in one week 5. Please follow up on the following pending results: Repeat Urine Cx from 12/17/16  Home Health: No Equipment/Devices: None  Discharge Condition: Stable CODE STATUS: FULL CODE Diet recommendation: Heart Healthy   Brief/Interim Summary: Tim Campbell is a 83 year old male, independent of activities of daily living, PMH of AAA, BPH with history of Foley catheter for intermittent urinary retention, chronic combined CHF, CAD status post CABG and stent on aspirin and Plavix (cardiology/Dr. Daneen Schick), HTN, hypothyroid, HLD, NHL in remission, TAVR, status post thallium laser ablation of prostate on 12/05/16 (Dr. Pilar Jarvis, Urology), presented to [facility] with history of hematuria and difficulty urinating. Post urology procedure on 12/05/16, patient returned home with a Foley catheter which was removed 48 hours later. He was doing well with minimal hematuria and no other urinary symptoms or fever. 48 hours prior to this admission, he started noticing worsening blood in the urine, blood clots, difficulty urinating with decreased stream and dribbling but denied pain, fever or chills. He had no other symptoms. He initially went to [facility] [place] where a new Foley catheter was placed, urology was consulted and plans were to discharge home on oral Bactrim. However the catheter got blocked again and patient spiked a fever. He was then transferred to [facility] ED as per urology recommendations. Underwent  Urologic Evaluation and had Foley changed and CBI and then had Foley removed and Trial of Void with minimal hematuria. Fever resolved and Urology recommended continuing ASA and restarting Plavix (12/20/16). Also recommended continuing Abx for 7 days and follow up with Urology as an outpatient and previously scheduled appointment. At this time patient deemed medically stable to D/C Home.   Discharge Diagnoses:  Principal Problem:   Acute urinary retention Active Problems:   CAD (coronary artery disease) of artery bypass graft   HTN (hypertension)   Chronic combined systolic and diastolic HF (heart failure), NYHA class 3 (HCC)   Hyperlipidemia   S/P TAVR (transcatheter aortic valve replacement)   Thrombocytopenia (HCC)   Hypothyroidism   Hematuria, gross- s/p TURP ~ 12 days prior to admission.   Renal failure, acute on chronic (HCC)  Acute gross hematuria/acute urinary retention/complicated UTI/AKI, improved -In a patient status post recent thallium laser ablation of prostate on 12/05/16 and Foley catheter removal 48 hours after procedure.  -Possibly bleeding from prostate bed versus secondary to UTI.  -Urology consultation appreciated and changed urinary catheter to 24 French three-way catheter and initiated continuous bladder irrigation and now is D/C'd and TOV done (clear yellow urine) with minimal PVR -Dr Algis Liming  discussed with patient's primary Cardiologist Dr. Tamala Julian who advised that it was okay to temporarily hold patient's antiplatelets/aspirin &Plavix due to ongoing bleeding issues. - Monitor CBCs closely and repeat as an outpatient; Hb/Hct showed 12.3/37.5 today -Repeat Urine culture pending -Rocephin changed to Ciprofloxacin for 7 days total -Follow up with Urology as an outpatient at next scheduled appointment    HTN,  -BP low normal, Decreased imdur from 90mg  daily to 30mg  daily, decrease lopressor  from 25mg  bid to 12.5 mg bid while in the hospital, both with holding  parameters -Will continue at D/C and have Cardiology/PCP titrate back up to doses as necessary  CAD status post CABG and stents, Status post AVR: -Continue beta blockers and statins. Aspirin and Plavix were temporarily held as discussed above. Dr. Tamala Julian indicated that he has not had a coronary stent in greater than 1 year and TAVR was greater than 3 months ago and hence okay to hold antiplatelets. -Restarted ASA and per Urology ok to restart Plavix in AM (12/20/16)  -Follow up with Cardiology Dr. Tamala Julian as an outpatient.   Chronic combined systolic and diastolic CHF: last ef 09-32%.  -Compensated or may be even slightly volume depleted. Monitor closely while being gently hydrated. -Home meds HCTZ held since admission; Will continue to hold at D/C and have PCP reinitiate   Hyperlipidemia:  -C/w Home Atorvastatin.  Chronic thrombocytopenia: -mild, plt 110-130, Stable. -Repeat CBC as an outpatient  Hypothyroidism: -C/w Home Synthroid.  Discharge Instructions  Discharge Instructions    Call MD for:  difficulty breathing, headache or visual disturbances    Complete by:  As directed    Call MD for:  extreme fatigue    Complete by:  As directed    Call MD for:  persistant dizziness or light-headedness    Complete by:  As directed    Call MD for:  persistant nausea and vomiting    Complete by:  As directed    Call MD for:  redness, tenderness, or signs of infection (pain, swelling, redness, odor or green/yellow discharge around incision site)    Complete by:  As directed    Call MD for:  severe uncontrolled pain    Complete by:  As directed    Call MD for:  temperature >100.4    Complete by:  As directed    Diet - low sodium heart healthy    Complete by:  As directed    Discharge instructions    Complete by:  As directed    Follow up with PCP and Urology as an Outpatient. Take all medications as prescribed. If symptoms change or worsen please return to the ED for evaluation.    Increase activity slowly    Complete by:  As directed      Allergies as of 12/19/2016   No Known Allergies     Medication List    STOP taking these medications   hydrochlorothiazide 12.5 MG capsule Commonly known as:  MICROZIDE     TAKE these medications   aspirin EC 81 MG tablet Take 81 mg by mouth daily.   atorvastatin 40 MG tablet Commonly known as:  LIPITOR Take 40 mg by mouth daily at 6 PM.   B-complex with vitamin C tablet Take 1 tablet by mouth 3 (three) times a week.   CHLORELLA PO Take 4 tablets by mouth 2 (two) times daily. MED NAME: CHLORELLA   ciprofloxacin 250 MG tablet Commonly known as:  CIPRO Take 1 tablet (250 mg total) by mouth 2 (two) times daily. Start taking on:  12/20/2016   clopidogrel 75 MG tablet Commonly known as:  PLAVIX Take 1 tablet (75 mg total) by mouth daily. PLEASE START ON 12/20/16 Per UROLOGY RECCOMENDATIONS Start taking on:  12/20/2016 What changed:  additional instructions   finasteride 5 MG tablet Commonly known as:  PROSCAR Take 5 mg by mouth every morning.   folic acid 671 MCG tablet Commonly known as:  FOLVITE Take  800 mcg by mouth 3 (three) times a week.   isosorbide mononitrate 30 MG 24 hr tablet Commonly known as:  IMDUR Take 1 tablet (30 mg total) by mouth every morning. Start taking on:  12/20/2016 What changed:  medication strength  how much to take  when to take this  additional instructions   levothyroxine 125 MCG tablet Commonly known as:  SYNTHROID, LEVOTHROID Take 125 mcg by mouth daily before breakfast.   Magnesium 200 MG Tabs Take 200 mg by mouth daily.   metoprolol tartrate 25 MG tablet Commonly known as:  LOPRESSOR Take 0.5 tablets (12.5 mg total) by mouth 2 (two) times daily. What changed:  how much to take   nitroGLYCERIN 0.4 MG SL tablet Commonly known as:  NITROSTAT Place 1 tablet (0.4 mg total) under the tongue every 5 (five) minutes as needed for chest pain.   SELENIUM PO Take 1  tablet by mouth 3 (three) times a week.   tamsulosin 0.4 MG Caps capsule Commonly known as:  FLOMAX Take 0.4 mg by mouth 2 (two) times daily.   vitamin C 1000 MG tablet Take 1,000 mg by mouth 3 (three) times a week.      Follow-up Information    Nickie Retort, MD. Schedule an appointment as soon as possible for a visit in 1 day.   Specialty:  Urology Contact information: [place] [address] [phone]          No Known Allergies  Consultations:  Urology Dr. Romilda Garret  Cardiology via Telephone conversation  Procedures/Studies: Dg Chest 2 View  Result Date: 12/17/2016 CLINICAL DATA:  83 year old male with cough and fever. EXAM: CHEST  2 VIEW COMPARISON:  Chest radiograph dated 06/27/2016 FINDINGS: There is stable mild to moderate cardiomegaly. Median sternotomy changes, CABG vascular clips, and aortic valve replacement noted. There is no focal consolidation, pleural effusion, or pneumothorax. There is osteopenia with degenerative changes of the spine. No acute osseous pathology identified. IMPRESSION: No acute cardiopulmonary process. Stable mild to moderate cardiomegaly and postsurgical changes of CABG and aortic valve replacement. No vascular congestion or pulmonary edema. Electronically Signed   By: Anner Crete M.D.   On: 12/17/2016 02:05   Three Way Foley/ CBI  Existing catheter was removed after removing sterile water from catheter balloon. The patient was prepped with betadine and draped in usual fashion for Foley placement. A 24Fr 3-way hematuria catheter was advanced into the bladder without difficulty. 30cc sterile water was placed in the balloon. This was gently brought back to the bladder neck. The bladder was flushed with about 600cc sterile water with titration of urine down to a watermelon color and removal of minimal small clot burden (<5cc). The catheter was attached to CBI which was run at a medium drip with production of light  watermelon tinged urine. The catheter was attached to a standing gravity bag.   Subjective: Seen and examined during bedside and doing extremely well and better. No nausea or vomiting. States hematuria was minimal and voiding without problems. Ready to go home.  Discharge Exam: Vitals:   12/19/16 0500 12/19/16 0804  BP:  (!) 102/55  Pulse:  71  Resp:    Temp: 98.2 F (36.8 C) 97.4 F (36.3 C)   Vitals:   12/18/16 2037 12/19/16 0429 12/19/16 0500 12/19/16 0804  BP: (!) 105/41 130/61  (!) 102/55  Pulse: 65 73  71  Resp: 18 18    Temp: 99.5 F (37.5 C) (!) 100.5  F (38.1 C) 98.2 F (36.8 C) 97.4 F (36.3 C)  TempSrc: Oral Oral Oral Oral  SpO2: 94% 98%  97%  Weight:  95.2 kg (209 lb 14.4 oz)    Height:       General: Pt is alert, awake, not in acute distress Cardiovascular: RRR, S1/S2 +, no rubs, no gallops Respiratory: CTA bilaterally, no wheezing, no rhonchi; Patient is not tachypenic or using any accessory muscles to breathe.  Abdominal: Soft, NT, ND, bowel sounds + Extremities: no edema, no cyanosis  The results of significant diagnostics from this hospitalization (including imaging, microbiology, ancillary and laboratory) are listed below for reference.    Microbiology: Recent Results (from the past 240 hour(s))  Urine culture     Status: None   Collection Time: 12/16/16 11:36 PM  Result Value Ref Range Status   Specimen Description URINE, CATHETERIZED  Final   Special Requests NONE  Final   Culture   Final    NO GROWTH Performed at [facility], [address] [place], [place] [address]    Report Status 12/18/2016 FINAL  Final    Labs: BNP (last 3 results) No results for input(s): BNP in the last 8760 hours. Basic Metabolic Panel:  Recent Labs Lab 12/16/16 2235 12/18/16 0616 12/19/16 0608  NA 136 136 132*  K 4.0 4.1 3.6  CL 104 105 101  CO2 26 24 24   GLUCOSE 106* 102* 89  BUN 21* 22* 16  CREATININE 1.54* 0.99 0.96  CALCIUM 8.6* 8.4* 8.2*   MG  --   --  1.9   Liver Function Tests: No results for input(s): AST, ALT, ALKPHOS, BILITOT, PROT, ALBUMIN in the last 168 hours. No results for input(s): LIPASE, AMYLASE in the last 168 hours. No results for input(s): AMMONIA in the last 168 hours. CBC:  Recent Labs Lab 12/16/16 2235 12/17/16 1651 12/18/16 0616 12/19/16 0608  WBC 5.8 6.1 7.3 5.7  NEUTROABS 3.9  --   --  3.5  HGB 12.2* 12.3* 11.6* 12.3*  HCT 36.9* 37.1* 35.8* 37.5*  MCV 85.2 83.9 82.7 83.9  PLT 135* 121* 127* 113*   Cardiac Enzymes: No results for input(s): CKTOTAL, CKMB, CKMBINDEX, TROPONINI in the last 168 hours. BNP: Invalid input(s): POCBNP CBG: No results for input(s): GLUCAP in the last 168 hours. D-Dimer No results for input(s): DDIMER in the last 72 hours. Hgb A1c No results for input(s): HGBA1C in the last 72 hours. Lipid Profile No results for input(s): CHOL, HDL, LDLCALC, TRIG, CHOLHDL, LDLDIRECT in the last 72 hours. Thyroid function studies  Recent Labs  12/19/16 0608  TSH 2.441   Anemia work up No results for input(s): VITAMINB12, FOLATE, FERRITIN, TIBC, IRON, RETICCTPCT in the last 72 hours. Urinalysis    Component Value Date/Time   COLORURINE YELLOW 12/17/2016 0812   APPEARANCEUR CLOUDY (A) 12/17/2016 0812   LABSPEC 1.008 12/17/2016 0812   PHURINE 5.0 12/17/2016 0812   GLUCOSEU NEGATIVE 12/17/2016 0812   HGBUR LARGE (A) 12/17/2016 0812   BILIRUBINUR NEGATIVE 12/17/2016 0812   KETONESUR NEGATIVE 12/17/2016 0812   PROTEINUR NEGATIVE 12/17/2016 0812   UROBILINOGEN 0.2 06/01/2015 0850   NITRITE NEGATIVE 12/17/2016 0812   LEUKOCYTESUR LARGE (A) 12/17/2016 0812   Sepsis Labs Invalid input(s): PROCALCITONIN,  WBC,  LACTICIDVEN Microbiology Recent Results (from the past 240 hour(s))  Urine culture     Status: None   Collection Time: 12/16/16 11:36 PM  Result Value Ref Range Status   Specimen Description URINE, CATHETERIZED  Final   Special Requests NONE  Final   Culture    Final    NO GROWTH Performed at [facility], [place] [address] [place], [place] [address]    Report Status 12/18/2016 FINAL  Final   Time coordinating discharge: Over 30 minutes  SIGNED:  Kerney Elbe, DO [facility] 12/19/2016, 12:37 PM Pager [phone]  If 7PM-7AM, please contact night-coverage [email or web address] Password [email or web address]

## 2016-12-19 NOTE — Progress Notes (Signed)
D/C'd to home via w/c w/ Dtr.d/c instructions given w/ verbal understanding. Voices no c/o.

## 2016-12-20 LAB — URINE CULTURE

## 2016-12-26 DIAGNOSIS — R339 Retention of urine, unspecified: Secondary | ICD-10-CM | POA: Diagnosis not present

## 2016-12-26 DIAGNOSIS — D500 Iron deficiency anemia secondary to blood loss (chronic): Secondary | ICD-10-CM | POA: Diagnosis not present

## 2016-12-27 ENCOUNTER — Ambulatory Visit: Payer: Medicare Other | Admitting: Physician Assistant

## 2016-12-29 ENCOUNTER — Other Ambulatory Visit: Payer: Self-pay | Admitting: Interventional Cardiology

## 2017-01-04 ENCOUNTER — Ambulatory Visit
Admission: RE | Admit: 2017-01-04 | Discharge: 2017-01-04 | Disposition: A | Discharge status: Home / Self Care | Payer: Medicare Other | Source: Ambulatory Visit | Attending: Internal Medicine | Admitting: Internal Medicine

## 2017-01-04 DIAGNOSIS — R911 Solitary pulmonary nodule: Secondary | ICD-10-CM

## 2017-01-04 DIAGNOSIS — R918 Other nonspecific abnormal finding of lung field: Secondary | ICD-10-CM | POA: Diagnosis not present

## 2017-01-07 DIAGNOSIS — N400 Enlarged prostate without lower urinary tract symptoms: Secondary | ICD-10-CM | POA: Diagnosis not present

## 2017-01-21 ENCOUNTER — Other Ambulatory Visit: Payer: Self-pay | Admitting: Interventional Cardiology

## 2017-01-22 ENCOUNTER — Encounter (INDEPENDENT_AMBULATORY_CARE_PROVIDER_SITE_OTHER): Payer: Self-pay

## 2017-01-22 ENCOUNTER — Encounter: Payer: Self-pay | Admitting: Interventional Cardiology

## 2017-01-22 ENCOUNTER — Ambulatory Visit (INDEPENDENT_AMBULATORY_CARE_PROVIDER_SITE_OTHER): Payer: Medicare Other | Admitting: Interventional Cardiology

## 2017-01-22 VITALS — BP 106/54 | HR 62 | Ht 70.0 in | Wt 206.8 lb

## 2017-01-22 DIAGNOSIS — Z952 Presence of prosthetic heart valve: Secondary | ICD-10-CM

## 2017-01-22 DIAGNOSIS — I209 Angina pectoris, unspecified: Secondary | ICD-10-CM

## 2017-01-22 DIAGNOSIS — I25119 Atherosclerotic heart disease of native coronary artery with unspecified angina pectoris: Secondary | ICD-10-CM | POA: Diagnosis not present

## 2017-01-22 DIAGNOSIS — I4510 Unspecified right bundle-branch block: Secondary | ICD-10-CM

## 2017-01-22 DIAGNOSIS — I5042 Chronic combined systolic (congestive) and diastolic (congestive) heart failure: Secondary | ICD-10-CM

## 2017-01-22 MED ORDER — NITROGLYCERIN 0.4 MG SL SUBL: 0.4000 mg | SUBLINGUAL_TABLET | SUBLINGUAL | 11 refills | Status: AC | PRN

## 2017-01-22 NOTE — Progress Notes (Signed)
Cardiology Office Note    Date:  01/22/2017   ID:  Tim Campbell, DOB [birth year], MRN [id number]  PCP:  [name], MD  Cardiologist: [name], MD   Chief Complaint  Patient presents with  . Coronary Artery Disease    History of Present Illness:  Tim Campbell is a 83 y.o. male  who has a history of coronary artery disease with prior bypass surgery, bypass graft with prior SV graft stent implantations, severe aortic stenosis, hyperlipidemia, hypertension, and combined systolic and diastolic heart failure with recent EF 45%. He had successful TAVR in September 2017. He is here today for clinical follow-up.  He is doing okay. Use nitroglycerin once in the last 6 months. Denies orthopnea and PND. Has bilateral calf discomfort with ambulation. Denies orthopnea and PND. No medication side effects.   Past Medical History:  Diagnosis Date  . AAA (abdominal aortic aneurysm) ([place])    a. 3.3x3.0cm by CT 05/2016.  Marland Kitchen BPH (benign prostatic hyperplasia)   . Chronic combined systolic and diastolic HF (heart failure), NYHA class 3   . Coronary artery disease CARDIOLOGIST-  DR Angelena Form   a. s/p CABGx5 in 1993. b. NSTEMI with occluded VG-diag in 2005. c. BMS to prox VG-PDA 2012. d. BMS to SVG-OM 03/2014. e. stable cath 05/2016.  . Diverticulosis of colon   . Heart murmur   . History of colon polyps    2002;  2007  . History of non-ST elevation myocardial infarction (NSTEMI)    x2 --- 12-29-2003  and  01-01-2013  . Hypertension   . Hypothyroidism   . Lower urinary tract symptoms (LUTS)   . Mixed hyperlipidemia   . Non Hodgkin's lymphoma ([place]) dx 2006 bone marrow bx--- onocologist-  dr Marin Olp   s/p  chemo and radiation therpay--- in remission since ---  . OA (osteoarthritis)   . Peripheral neuropathy   . Pulmonary nodules    a. by CT 05/2016  . RBBB (right bundle branch block)   . S/p bare metal coronary artery stent    10-16-2010  BMS to RCA graft  . S/P CABG x 5      05/ 1993  . 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 Urinary retention   . Wears dentures    upper  . Wears glasses     Past Surgical History:  Procedure Laterality Date  . CARDIAC CATHETERIZATION N/A 05/17/2016   Procedure: Right/Left Heart Cath and Coronary/Graft Angiography;  Surgeon: Nelva Bush, MD;  Location: [facility];  Service: Cardiovascular;  Laterality: N/A;  severe CAD,  total occlusion of mLAD, dLCx, and mRCA, 99% ostial LCX/ wide patent LIMA to LAD & SVG to OM, patent SVG to PDA/PL w/ stable 50% ISR at ostium SVG/ chronic occluded SVG to Diagonal/ severe AVS  . CARDIAC CATHETERIZATION  12-30-2003  dr Leonia Reeves   severe 3v cad:  occlused SVG to diagonal causing NSTEMI,  ef 45%  . CATARACT EXTRACTION W/ INTRAOCULAR LENS  IMPLANT, BILATERAL    . COLONOSCOPY    . CORONARY ANGIOPLASTY WITH STENT PLACEMENT  10-20-2010  dr Daneen Schick   BMS to ostium of the RCA graft:  total occlusion LAD, CFX, and RCA,  bypass graft 's-- total occlusion SVG to diagonal,  high-grade obstruction prox.Alfredo Batty segment of RCA graft,  mid SVG to OM 50% otherwise patent/  mild AVS inferior wall hypokinesis  . CORONARY ARTERY BYPASS GRAFT  05/ 1993  dr gerhart   LIMA to LAD,  SVG to D1,  SVT to RCA,  SVG to OM,  SVG to PDA  . [place]  . I&D EXTREMITY Left 03/09/2016   Procedure: IRRIGATION AND DEBRIDEMENT WRIST;  Surgeon: Milly Jakob, MD;  Location: [facility];  Service: Orthopedics;  Laterality: Left;  . INGUINAL HERNIA REPAIR Right 02/21/2006  . KNEE ARTHROSCOPY Right 1970'S   . LEFT AND RIGHT HEART CATHETERIZATION WITH CORONARY/GRAFT ANGIOGRAM N/A 04/05/2014   Procedure: LEFT AND RIGHT HEART CATHETERIZATION WITH [name];  Surgeon: Burnell Blanks, MD;  Location: [facility] CATH LAB;  Service: Cardiovascular;  Laterality: N/A;  . LEFT HEART CATHETERIZATION WITH CORONARY/GRAFT ANGIOGRAM N/A 01/02/2013   Procedure: LEFT HEART  CATHETERIZATION WITH [name];  Surgeon: [name], MD;  Location: [facility] CATH LAB;  Service: Cardiovascular;  Laterality: N/A;NSTEMI:side-to-side anastomosis w/the PDA is threatened(not amenable to PCI)in-stent restenosis  RCA graft,  50% osyial SVG to OM , LIMA to LAD patent w/ diffuse distal LAD disease/  severe 3V CAD w/ occlusion LAD, CFX, RCA, ef 45%  . OPEN BX LEFT CERVICAL MASS, POSTERIOR NECK  10/03/2004  . OPEN REDUCTION INTERNAL FIXATION (ORIF) DISTAL RADIAL FRACTURE Left 02/24/2016   Procedure: OPEN TREATMENT OF LEFT DISTAL RADIUS FRACTURE;  Surgeon: Milly Jakob, MD;  Location: [facility];  Service: Orthopedics;  Laterality: Left;  . ORIF RIGHT ANKLE FX  10/07/2008  . PERCUTANEOUS CORONARY STENT INTERVENTION (PCI-S) N/A 04/21/2014   Procedure: PERCUTANEOUS CORONARY STENT INTERVENTION (PCI-S);  Surgeon: [name], MD;  Location: [facility] CATH LAB;  Service: Cardiovascular;  Laterality: N/A;   BMS in the ostial/proximal SVG to CFX,  stable 50-70% ostial SVG to RCA (previous stenting)  . TEE WITHOUT CARDIOVERSION N/A 06/26/2016   Procedure: TRANSESOPHAGEAL ECHOCARDIOGRAM (TEE);  Surgeon: Burnell Blanks, MD;  Location: [facility];  Service: Open Heart Surgery;  Laterality: N/A;  . THULIUM LASER TURP (TRANSURETHRAL RESECTION OF PROSTATE) N/A 12/05/2016   Procedure: THULIUM LASER TURP (TRANSURETHRAL RESECTION OF PROSTATE);  Surgeon: Nickie Retort, MD;  Location: [facility];  Service: Urology;  Laterality: N/A;  . TRANSCATHETER AORTIC VALVE REPLACEMENT, TRANSFEMORAL Bilateral 06/26/2016   Procedure: TRANSCATHETER AORTIC VALVE REPLACEMENT, TRANSFEMORAL;  Surgeon: Burnell Blanks, MD;  Location: [facility];  Service: Open Heart Surgery;  Laterality: Bilateral;  . TRANSTHORACIC ECHOCARDIOGRAM  08/03/2016    post TAVR    ef 40-45%,  akinesis and scarring of the basal-inferolateral, inferior, and inferoseptal (consistent w/ infarction in distribution of the RCA), severe hypokinesis  of the basal anteroseptal (consistent w/ ischemia in distribution of the LAD but w/ patent distal graft),  grade 2 diastolic dysfunction/  AV stent valve bioprosthesis normal function w/ trivial regurg (valve area 1.16cm^2)/  . TRANSTHORACIC ECHOCARDIOGRAM  08-03-2016  continued results- post TAVR   mild dilated ascending aorta/  mild to moderate MR, peak grandient 62mmHg/  severe LAE and mild RAE/  mild PR/  trivial TR    Current Medications: Outpatient Medications Prior to Visit  Medication Sig Dispense Refill  . Ascorbic Acid (VITAMIN C) 1000 MG tablet Take 1,000 mg by mouth 3 (three) times a week.     Marland Kitchen aspirin EC 81 MG tablet Take 81 mg by mouth daily.     Marland Kitchen atorvastatin (LIPITOR) 40 MG tablet Take 40 mg by mouth daily at 6 PM.     . B Complex-C (B-COMPLEX WITH VITAMIN C) tablet Take 1 tablet by mouth 3 (three) times a week.     Marland Kitchen  clopidogrel (PLAVIX) 75 MG tablet Take 1 tablet (75 mg total) by mouth daily. PLEASE START ON 12/20/16 Per UROLOGY RECCOMENDATIONS 30 tablet 0  . folic acid (FOLVITE) 062 MCG tablet Take 800 mcg by mouth 3 (three) times a week.     . levothyroxine (SYNTHROID, LEVOTHROID) 125 MCG tablet Take 125 mcg by mouth daily before breakfast.   3  . Magnesium 200 MG TABS Take 200 mg by mouth daily.     . metoprolol tartrate (LOPRESSOR) 25 MG tablet Take 0.5 tablets (12.5 mg total) by mouth 2 (two) times daily. 30 tablet 6  . Multiple Vitamins-Iron (CHLORELLA PO) Take 4 tablets by mouth 2 (two) times daily. MED NAME: CHLORELLA    . SELENIUM PO Take 1 tablet by mouth 3 (three) times a week.     . tamsulosin (FLOMAX) 0.4 MG CAPS capsule Take 0.4 mg by mouth daily.   11  . isosorbide mononitrate (IMDUR) 30 MG 24 hr tablet Take 1 tablet (30 mg total) by mouth every morning. 30 tablet 0  . nitroGLYCERIN (NITROSTAT) 0.4 MG SL tablet Place 1 tablet (0.4 mg total) under the tongue every 5 (five) minutes as needed for chest pain. 25 tablet 11  . ciprofloxacin (CIPRO) 250 MG tablet Take  1 tablet (250 mg total) by mouth 2 (two) times daily. (Patient not taking: Reported on 01/22/2017) 8 tablet 0  . finasteride (PROSCAR) 5 MG tablet Take 5 mg by mouth every morning.   11   No facility-administered medications prior to visit.      Allergies:   Patient has no known allergies.   Social History   Social History  . Marital status: Married    Spouse name: N/A  . Number of children: 3  . Years of education: N/A   Occupational History  . 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 Smoking status: Former Smoker    Packs/day: 0.50    Years: 15.00    Types: Cigarettes    Quit date: 06/22/1961  . Smokeless tobacco: Never Used  . Alcohol use No  . Drug use: No  . Sexual activity: Not Asked   Other Topics Concern  . None   Social History Narrative  . None     Family History:  The patient's family history includes Bone cancer in his brother; CAD in his brother; Colon cancer in his father; Dementia in his sister; Parkinson's disease in his brother; Stroke in his mother, sister, and sister.   ROS:   Please see the history of present illness.    Has cough, occasional blood in the urine, but otherwise no complaints.  All other systems reviewed and are negative.   PHYSICAL EXAM:   VS:  BP (!) 106/54 (BP Location: Left Arm)   Pulse 62   Ht 5\' 10"  (1.778 m)   Wt 206 lb 12.8 oz (93.8 kg)   BMI 29.67 kg/m    GEN: Well nourished, well developed, in no acute distress  HEENT: normal  Neck: no JVD, carotid bruits, or masses Cardiac: RRR,  rubs, or gallops,no edema . Very faint systolic murmur. Respiratory:  clear to auscultation bilaterally, normal work of breathing GI: soft, nontender, nondistended, + BS MS: no deformity or atrophy  Skin: warm and dry, no rash Neuro:  Alert and Oriented x 3, Strength and sensation are intact Psych: euthymic mood, full affect  Wt Readings from Last 3 Encounters:  01/22/17 206 lb 12.8 oz (93.8 kg)  12/19/16 209 lb 14.4 oz (95.2 kg)  12/05/16 212 lb (96.2 kg)      Studies/Labs Reviewed:   EKG:  EKG  Not performed  Recent Labs: 06/22/2016: ALT 13 12/19/2016: BUN 16; Creatinine, Ser 0.96; Hemoglobin 12.3; Magnesium 1.9; Platelets 113; Potassium 3.6; Sodium 132; TSH 2.441   Lipid Panel No results found for: CHOL, TRIG, HDL, CHOLHDL, VLDL, LDLCALC, LDLDIRECT  Additional studies/ records that were reviewed today include:  No recent imaging studies.  Echocardiogram done post Taber 08/03/16:  ASSESSMENT:    1. Coronary artery disease involving native coronary artery of native heart with angina pectoris ([place])   2. S/P TAVR (transcatheter aortic valve replacement)   3. Chronic combined systolic and diastolic HF (heart failure), NYHA class 3 ([place])   4. RBBB      PLAN:  In order of problems listed above:  1. He is doing well. Stable angina. Blood pressure is relatively low. Discontinue Imdur. Notify us if and crease anginal grade. 2. Markedly improved after aortic valve replacement. Auscultation is normal for the clinical subset. Follow-up with Dr. Angelena Form in 6 months. 3. No evidence of volume overload. 2 g sodium diet is emphasized.  Overall plan is clinical follow-up with me in one year. He will see Dr. Angelena Form in 6 months. He will call if angina or cardiac complaint    Medication Adjustments/Labs and Tests Ordered: Current medicines are reviewed at length with the patient today.  Concerns regarding medicines are outlined above.  Medication changes, Labs and Tests ordered today are listed in the Patient Instructions below. Patient Instructions  Medication Instructions:  1) STOP Imdur.  If you begin to have chest pain more often and are having to take Nitro frequently, you need to restart Imdur and let us know.  Labwork: None  Testing/Procedures: None  Follow-Up: Keep plan to see Dr. Angelena Form in October 2018.  Your physician wants you to follow-up in: 1 year with  Dr. Tamala Julian.  You will receive a reminder letter in the mail two months in advance. If you don't receive a letter, please call our office to schedule the follow-up appointment.   Any Other Special Instructions Will Be Listed Below (If Applicable).     If you need a refill on your cardiac medications before your next appointment, please call your pharmacy.      Signed, [name], MD  01/22/2017 2:34 PM    [address], [place], [place]  [address] Phone: [phone]; Fax: [phone]

## 2017-01-22 NOTE — Patient Instructions (Signed)
Medication Instructions:  1) STOP Imdur.  If you begin to have chest pain more often and are having to take Nitro frequently, you need to restart Imdur and let us know.  Labwork: None  Testing/Procedures: None  Follow-Up: Keep plan to see Dr. Angelena Form in October 2018.  Your physician wants you to follow-up in: 1 year with Dr. Tamala Julian.  You will receive a reminder letter in the mail two months in advance. If you don't receive a letter, please call our office to schedule the follow-up appointment.   Any Other Special Instructions Will Be Listed Below (If Applicable).     If you need a refill on your cardiac medications before your next appointment, please call your pharmacy.

## 2017-01-22 NOTE — Telephone Encounter (Signed)
All Notes   Patient Instructions by Loren Racer, LPN at 3/00/9233 0:07 PM   Author: Loren Racer, LPN Author Type: Licensed Practical Nurse Filed: 01/22/2017 2:29 PM  Note Status: Signed Cosign: Cosign Not Required Encounter Date: 01/22/2017  Editor: Loren Racer, LPN (Licensed Practical Nurse)    Medication Instructions:  1) STOP Imdur.  If you begin to have chest pain more often and are having to take Nitro frequently, you need to restart Imdur and let us know.

## 2017-01-24 ENCOUNTER — Other Ambulatory Visit: Payer: Self-pay | Admitting: *Deleted

## 2017-01-24 DIAGNOSIS — I350 Nonrheumatic aortic (valve) stenosis: Secondary | ICD-10-CM

## 2017-03-14 ENCOUNTER — Other Ambulatory Visit: Payer: Self-pay | Admitting: Physician Assistant

## 2017-03-16 ENCOUNTER — Other Ambulatory Visit: Payer: Self-pay | Admitting: Physician Assistant

## 2017-03-18 NOTE — Telephone Encounter (Signed)
Medication Detail    Disp Refills Start End   metoprolol tartrate (LOPRESSOR) 25 MG tablet 30 tablet 6 12/31/2016    Sig - Route: Take 0.5 tablets (12.5 mg total) by mouth 2 (two) times daily. - Oral   Notes to Pharmacy: LAST RX FROM DR Idolina Primer 0.5 TABS;BID;PLEASE CLARIFY   E-Prescribing Status: Receipt confirmed by pharmacy (12/31/2016 11:57 AM EDT)   Pharmacy   [id number] - [place],  - [address]

## 2017-03-27 DIAGNOSIS — N400 Enlarged prostate without lower urinary tract symptoms: Secondary | ICD-10-CM | POA: Diagnosis not present

## 2017-04-22 ENCOUNTER — Other Ambulatory Visit: Payer: Self-pay | Admitting: Interventional Cardiology

## 2017-04-30 DIAGNOSIS — H353131 Nonexudative age-related macular degeneration, bilateral, early dry stage: Secondary | ICD-10-CM | POA: Diagnosis not present

## 2017-06-24 ENCOUNTER — Other Ambulatory Visit (HOSPITAL_COMMUNITY): Payer: Medicare Other

## 2017-06-24 ENCOUNTER — Ambulatory Visit: Payer: Medicare Other | Admitting: Cardiovascular Disease

## 2017-07-08 NOTE — Progress Notes (Signed)
[facility]   MULTIDISCIPLINARY HEART VALVE TEAM   Cardiology Office Note    Date:  07/11/2017   ID:  LENO MATHES, DOB 04-10-1926, MRN [id number]  PCP:  [name], MD  Cardiologist:  Dr. Smith/ Dr. Angelena Form (TAVR)  CC: 1 year follow up s/p TAVR  History of Present Illness:  Tim Campbell is a 83 y.o. male with a history of  CAD s/p CABG x5V in 1993 and subsequent stenting, HTN, RBBB, HLD, hypothyroidism, large cell non-Hodgkins lymphoma and severe aortic stenosis s/p TAVR (08/2016) who presents to clinic for 1 year follow up.   His TAVR workup actually began in June 2015. Cardiac cath June 2015 and a stent was placed in a vein graft. He felt better and delayed the TAVR. In August 2017 he began to have more dyspnea and chest pain. Cardiac cath August 2017 with stable CAD. TAVR was performed on 06/26/16 with a 29 mm Sapient 3 valve. His hospital course post procedure was uneventful. Of note his CT abd/angio 06/06/16 showed 2 pulm nodules of the left lung. 1 month echo showed low normal LV function and well functioning bio prosthetic valve with mildly increased gradients ( mean 25 mm Hg and peak 42 mm Hg) but he was overall felt to be doing well.   He was admitted in 3/12-3/14/18 for gross hematuria s/p thallium laser ablation of prostate on 12/05/16 and Foley catheter removal 48 hours after procedure. Anti platelets were temporarily held.   Dr. Inda Merlin has been following his pulmonary nodules and follow up CT scan 01/2017 showed stable nodules.   Today he presents to clinic for 1 year follow up. No CP or SOB. No LE edema, orthopnea or PND. No dizziness or syncope. No blood in stool or urine. No palpitations. He is not very active but "piddles around all day." He can get to his mail box which is about 300 feet away if he takes it slowly.   Past Medical History:  Diagnosis Date  . AAA (abdominal aortic aneurysm) ([place])    a. 3.3x3.0cm by CT 05/2016.  Marland Kitchen BPH (benign  prostatic hyperplasia)   . Chronic combined systolic and diastolic HF (heart failure), NYHA class 3   . Coronary artery disease CARDIOLOGIST-  DR Angelena Form   a. s/p CABGx5 in 1993. b. NSTEMI with occluded VG-diag in 2005. c. BMS to prox VG-PDA 2012. d. BMS to SVG-OM 03/2014. e. stable cath 05/2016.  . Diverticulosis of colon   . Heart murmur   . History of colon polyps    2002;  2007  . History of non-ST elevation myocardial infarction (NSTEMI)    x2 --- 12-29-2003  and  01-01-2013  . Hypertension   . Hypothyroidism   . Lower urinary tract symptoms (LUTS)   . Mixed hyperlipidemia   . Non Hodgkin's lymphoma ([place]) dx 2006 bone marrow bx--- onocologist-  dr Marin Olp   s/p  chemo and radiation therpay--- in remission since ---  . OA (osteoarthritis)   . Peripheral neuropathy   . Pulmonary nodules    a. by CT 05/2016  . RBBB (right bundle branch block)   . S/p bare metal coronary artery stent    10-16-2010  BMS to RCA graft  . S/P CABG x 5    05/ 1993  . 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 Urinary retention   . Wears dentures    upper  . Wears glasses     Past Surgical History:  Procedure Laterality Date  . CARDIAC CATHETERIZATION N/A 05/17/2016   Procedure: Right/Left Heart Cath and Coronary/Graft Angiography;  Surgeon: Nelva Bush, MD;  Location: [facility];  Service: Cardiovascular;  Laterality: N/A;  severe CAD,  total occlusion of mLAD, dLCx, and mRCA, 99% ostial LCX/ wide patent LIMA to LAD & SVG to OM, patent SVG to PDA/PL w/ stable 50% ISR at ostium SVG/ chronic occluded SVG to Diagonal/ severe AVS  . CARDIAC CATHETERIZATION  12-30-2003  dr Le[place] Reeves   severe 3v cad:  occlused SVG to diagonal causing NSTEMI,  ef 45%  . CATARACT EXTRACTION W/ INTRAOCULAR LENS  IMPLANT, BILATERAL    . COLONOSCOPY    . CORONARY ANGIOPLASTY WITH STENT PLACEMENT  10-20-2010  dr Daneen Schick   BMS to ostium of the RCA graft:  total occlusion LAD, CFX, and RCA,   bypass graft 's-- total occlusion SVG to diagonal,  high-grade obstruction prox.Alfredo Batty segment of RCA graft,  mid SVG to OM 50% otherwise patent/  mild AVS inferior wall hypokinesis  . CORONARY ARTERY BYPASS GRAFT  05/ 1993     dr gerhart   LIMA to LAD,  SVG to D1,  SVT to RCA,  SVG to OM,  SVG to PDA  . [place]  . I&D EXTREMITY Left 03/09/2016   Procedure: IRRIGATION AND DEBRIDEMENT WRIST;  Surgeon: Milly Jakob, MD;  Location: [facility];  Service: Orthopedics;  Laterality: Left;  . INGUINAL HERNIA REPAIR Right 02/21/2006  . KNEE ARTHROSCOPY Right 1970'S   . LEFT AND RIGHT HEART CATHETERIZATION WITH CORONARY/GRAFT ANGIOGRAM N/A 04/05/2014   Procedure: LEFT AND RIGHT HEART CATHETERIZATION WITH [name];  Surgeon: Burnell Blanks, MD;  Location: [facility];  Service: Cardiovascular;  Laterality: N/A;  . LEFT HEART CATHETERIZATION WITH CORONARY/GRAFT ANGIOGRAM N/A 01/02/2013   Procedure: LEFT HEART CATHETERIZATION WITH [name];  Surgeon: [name], MD;  Location: [facility] CATH LAB;  Service: Cardiovascular;  Laterality: N/A;NSTEMI:side-to-side anastomosis w/the PDA is threatened(not amenable to PCI)in-stent restenosis  RCA graft,  50% osyial SVG to OM , LIMA to LAD patent w/ diffuse distal LAD disease/  severe 3V CAD w/ occlusion LAD, CFX, RCA, ef 45%  . OPEN BX LEFT CERVICAL MASS, POSTERIOR NECK  10/03/2004  . OPEN REDUCTION INTERNAL FIXATION (ORIF) DISTAL RADIAL FRACTURE Left 02/24/2016   Procedure: OPEN TREATMENT OF LEFT DISTAL RADIUS FRACTURE;  Surgeon: Milly Jakob, MD;  Location: [facility];  Service: Orthopedics;  Laterality: Left;  . ORIF RIGHT ANKLE FX  10/07/2008  . PERCUTANEOUS CORONARY STENT INTERVENTION (PCI-S) N/A 04/21/2014   Procedure: PERCUTANEOUS CORONARY STENT INTERVENTION (PCI-S);  Surgeon: [name], MD;  Location: [facility] C[facility];  Service: Cardiovascular;  Laterality: N/A;   BMS in the ostial/proximal SVG to CFX,  stable  50-70% ostial SVG to RCA (previous stenting)  . TEE WITHOUT CARDIOVERSION N/A 06/26/2016   Procedure: TRANSESOPHAGEAL ECHOCARDIOGRAM (TEE);  Surgeon: Burnell Blanks, MD;  Location: [facility];  Service: Open Heart Surgery;  Laterality: N/A;  . THULIUM LASER TURP (TRANSURETHRAL RESECTION OF PROSTATE) N/A 12/05/2016   Procedure: THULIUM LASER TURP (TRANSURETHRAL RESECTION OF PROSTATE);  Surgeon: Nickie Retort, MD;  Location: [facility];  Service: Urology;  Laterality: N/A;  . TRANSCATHETER AORTIC VALVE REPLACEMENT, TRANSFEMORAL Bilateral 06/26/2016   Procedure: TRANSCATHETER AORTIC VALVE REPLACEMENT, TRANSFEMORAL;  Surgeon: Burnell Blanks, MD;  Location: [facility];  Service: Open Heart Surgery;  Laterality: Bilateral;  . TRANSTHORACIC ECHOCARDIOGRAM  08/03/2016  post TAVR    ef 40-45%,  akinesis and scarring of the basal-inferolateral, inferior, and inferoseptal (consistent w/ infarction in distribution of the RCA), severe hypokinesis of the basal anteroseptal (consistent w/ ischemia in distribution of the LAD but w/ patent distal graft),  grade 2 diastolic dysfunction/  AV stent valve bioprosthesis normal function w/ trivial regurg (valve area 1.16cm^2)/  . TRANSTHORACIC ECHOCARDIOGRAM  08-03-2016  continued results- post TAVR   mild dilated ascending aorta/  mild to moderate MR, peak grandient 98mmHg/  severe LAE and mild RAE/  mild PR/  trivial TR    Current Medications: Outpatient Medications Prior to Visit  Medication Sig Dispense Refill  . Ascorbic Acid (VITAMIN C) 1000 MG tablet Take 1,000 mg by mouth 3 (three) times a week.     Marland Kitchen aspirin EC 81 MG tablet Take 81 mg by mouth daily.     Marland Kitchen atorvastatin (LIPITOR) 40 MG tablet Take 1 tablet (40 mg total) by mouth daily at 6 PM. 90 tablet 2  . B Complex-C (B-COMPLEX WITH VITAMIN C) tablet Take 1 tablet by mouth 3 (three) times a week.     . clopidogrel (PLAVIX) 75 MG tablet TAKE 1 TABLET (75 MG TOTAL) BY MOUTH DAILY. 90 tablet 3  . folic  acid (FOLVITE) 580 MCG tablet Take 800 mcg by mouth 3 (three) times a week.     . levothyroxine (SYNTHROID, LEVOTHROID) 125 MCG tablet Take 125 mcg by mouth daily before breakfast.   3  . Magnesium 200 MG TABS Take 200 mg by mouth daily.     . metoprolol tartrate (LOPRESSOR) 25 MG tablet Take 0.5 tablets (12.5 mg total) by mouth 2 (two) times daily. 30 tablet 6  . Multiple Vitamins-Iron (CHLORELLA PO) Take 4 tablets by mouth 2 (two) times daily. MED NAME: CHLORELLA    . nitroGLYCERIN (NITROSTAT) 0.4 MG SL tablet Place 1 tablet (0.4 mg total) under the tongue every 5 (five) minutes as needed for chest pain. 25 tablet 11  . SELENIUM PO Take 1 tablet by mouth 3 (three) times a week.     . tamsulosin (FLOMAX) 0.4 MG CAPS capsule Take 0.4 mg by mouth daily.   11  . atorvastatin (LIPITOR) 40 MG tablet Take 40 mg by mouth daily at 6 PM.     . clopidogrel (PLAVIX) 75 MG tablet Take 1 tablet (75 mg total) by mouth daily. PLEASE START ON 12/20/16 Per UROLOGY RECCOMENDATIONS 30 tablet 0   No facility-administered medications prior to visit.      Allergies:   Patient has no known allergies.   Social History   Social History  . Marital status: Married    Spouse name: N/A  . Number of children: 3  . Years of education: N/A   Occupational History  . 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 Smoking status: Former Smoker    Packs/day: 0.50    Years: 15.00    Types: Cigarettes    Quit date: 06/22/1961  . Smokeless tobacco: Never Used  . Alcohol use No  . Drug use: No  . Sexual activity: Not Asked   Other Topics Concern  . None   Social History Narrative  . None     Family History:  The patient's family history includes Bone cancer in his brother; CAD in his brother; Colon cancer in his father; Dementia in his sister; Parkinson's disease in his brother; Stroke in his mother, sister, and sister.  ROS:   Please see the history of present illness.    ROS  All other systems reviewed and are negative.   PHYSICAL EXAM:   VS:  BP 116/60   Pulse (!) 50   Ht 5\' 10"  (1.778 m)   Wt 213 lb (96.6 kg)   SpO2 96%   BMI 30.56 kg/m    GEN: Well nourished, well developed, in no acute distress  HEENT: normal  Neck: no JVD, carotid bruits, or masses Cardiac: RR; bradycardic, no murmurs, rubs, or gallops,no edema  Respiratory:  clear to auscultation bilaterally, normal work of breathing GI: soft, nontender, nondistended, + BS MS: no deformity or atrophy  Skin: warm and dry, no rash Neuro:  Alert and Oriented x 3, Strength and sensation are intact Psych: euthymic mood, full affect   Wt Readings from Last 3 Encounters:  07/10/17 213 lb (96.6 kg)  01/22/17 206 lb 12.8 oz (93.8 kg)  12/19/16 209 lb 14.4 oz (95.2 kg)      Studies/Labs Reviewed:   EKG:  EKG is ordered today.  The ekg ordered today demonstrates sinus brady with 1st degree AV block, HR 49  Recent Labs: 12/19/2016: BUN 16; Creatinine, Ser 0.96; Hemoglobin 12.3; Magnesium 1.9; Platelets 113; Potassium 3.6; Sodium 132; TSH 2.441   Lipid Panel No results found for: CHOL, TRIG, HDL, CHOLHDL, VLDL, LDLCALC, LDLDIRECT  Additional studies/ records that were reviewed today include:  Echo 08/03/16: Left ventricle: Systolic function was mildly to moderately reduced. The estimated ejection fraction was in the range of 40% to 45%. Akinesis and scarring of the basal-midinferolateral, inferior, and inferoseptal myocardium; consistent with infarction in the distribution of the right coronary artery. Severe hypokinesis of the basal anteroseptal myocardium; consistent with ischemia in the distribution of the left anterior descending coronary artery, but with a patent distal bypass. Features are consistent with a pseudonormal left ventricular filling pattern, with concomitant abnormal relaxation and increased filling pressure (grade 2 diastolic dysfunction). - Aortic valve:  A stent valve bioprosthesis (TAVR) was present and functioning normally. Doppler: There was trivial regurgitation. VTI ratio of LVOT to aortic valve: 0.28. Valve area (VTI): 1.16 cm^2. Indexed valve area (VTI): 0.53 cm^2/m^2. Peak velocity ratio of LVOT to aortic valve: 0.26. Valve area (Vmax): 1.09 cm^2. Indexed valve area (Vmax): 0.5 cm^2/m^2. Mean velocity ratio of LVOT to aortic valve: 0.28. Valve area (Vmean): 1.18 cm^2. Indexed valve area (Vmean): 0.54 cm^2/m^2. Mean gradient (S): 25 mm Hg. Peak gradient (S): 42 mm Hg. - Mitral valve: Calcified annulus. Mildly thickened leaflets . There was mild to moderate regurgitation directed centrally. - Left atrium: The atrium was severely dilated. - Right ventricle: The cavity size was mildly dilated. - Right atrium: The atrium was mildly dilated  CT chest 01/2017 IMPRESSION: 1. No acute findings. 2. 1 cm left lower lobe pulmonary nodule, stable since the exam dated 06/06/2016. ADDENDUM: Recommend additional follow-up chest CT in 12-18 months to document longer term stability of the left lower lobe nodule, as per the Fleischner criteria described on the CT from 06/06/2016. Also, not mentioned in the original report, the small nodule previously noted at the base of the left upper lobe lingula is no longer visualized   2D ECHO 07/10/17 Study Conclusions - Left ventricle: The cavity size was normal. There was mild   concentric hypertrophy. Systolic function was mildly to   moderately reduced. The estimated ejection fraction was in the   range of 40% to 45%. Diffuse hypokinesis. There is akinesis of  the basal-midinferolateral, inferior, and inferoseptal   myocardium. - Aortic valve: A 19mm Sapien 3 TAVR valve placed 06/26/16   prosthesis was present and functioning normally. Peak velocity   (S): 303 cm/s. Mean gradient (S): 20 mm Hg. - Mitral valve: Calcified annulus. There was mild regurgitation. - Left atrium: The atrium was  severely dilated. Volume/bsa, ES,   (1-plane Simpson&'s, A2C): 54.7 ml/m^2. - Tricuspid valve: There was mild regurgitation. - Pulmonary arteries: Systolic pressure was mildly increased. PA   peak pressure: 35 mm Hg (S). Impressions: - Compared to the prior study, there has been no significant   interval change.   ASSESSMENT & PLAN:   Severe AS s/p TAVR: doing well 1 year post operatively. NHYA class II symptoms. 1 year 2D ECHO shows a well seated valve with improved gradients: mean 20 mm Hg, peak 37 mm Hg. He continues on ASA and plavix. We went over SBE prophylaxis. He will continue regular follow up with Dr. Tamala Julian.   Chronic combined S/D CHF: appears euvolemic.   HTN: BP well controlled today   Pulmonary nodules: followed by Dr. Inda Merlin. Follow up CT scan 01/2017 showed stable nodules.  CAD: stable. Continue medical management with ASA, plavix, statin and BB.   Sinus bradycardia: HR 49 today but he is asymptomatic and HR appears to chronically be low. I have not made any med changes.   Medication Adjustments/Labs and Tests Ordered: Current medicines are reviewed at length with the patient today.  Concerns regarding medicines are outlined above.  Medication changes, Labs and Tests ordered today are listed in the Patient Instructions below. Patient Instructions  Medication Instructions:  Your physician recommends that you continue on your current medications as directed. Please refer to the Current Medication list given to you today.   Labwork: none  Testing/Procedures: none  Follow-Up: Your physician wants you to follow-up in: April or May with Dr. Gaspar Bidding will receive a reminder letter in the mail two months in advance. If you don't receive a letter, please call our office to schedule the follow-up appointment.   Any Other Special Instructions Will Be Listed Below (If Applicable).     If you need a refill on your cardiac medications before your next appointment,  please call your pharmacy.      Signed, Angelena Form, PA-C  07/11/2017 10:03 AM    [facility] [place] [address], [place], [place]  [address] Phone: [phone]; Fax: [phone]

## 2017-07-10 ENCOUNTER — Ambulatory Visit (HOSPITAL_COMMUNITY): Payer: Medicare Other | Attending: Cardiology

## 2017-07-10 ENCOUNTER — Encounter: Payer: Self-pay | Admitting: Physician Assistant

## 2017-07-10 ENCOUNTER — Other Ambulatory Visit: Payer: Self-pay

## 2017-07-10 ENCOUNTER — Ambulatory Visit (INDEPENDENT_AMBULATORY_CARE_PROVIDER_SITE_OTHER): Payer: Medicare Other | Admitting: Physician Assistant

## 2017-07-10 ENCOUNTER — Encounter (INDEPENDENT_AMBULATORY_CARE_PROVIDER_SITE_OTHER): Payer: Self-pay

## 2017-07-10 VITALS — BP 116/60 | HR 50 | Ht 70.0 in | Wt 213.0 lb

## 2017-07-10 DIAGNOSIS — R918 Other nonspecific abnormal finding of lung field: Secondary | ICD-10-CM

## 2017-07-10 DIAGNOSIS — Z952 Presence of prosthetic heart valve: Secondary | ICD-10-CM | POA: Diagnosis not present

## 2017-07-10 DIAGNOSIS — I2510 Atherosclerotic heart disease of native coronary artery without angina pectoris: Secondary | ICD-10-CM

## 2017-07-10 DIAGNOSIS — I10 Essential (primary) hypertension: Secondary | ICD-10-CM | POA: Diagnosis not present

## 2017-07-10 DIAGNOSIS — I5042 Chronic combined systolic (congestive) and diastolic (congestive) heart failure: Secondary | ICD-10-CM | POA: Diagnosis not present

## 2017-07-10 DIAGNOSIS — I4510 Unspecified right bundle-branch block: Secondary | ICD-10-CM | POA: Diagnosis not present

## 2017-07-10 DIAGNOSIS — I081 Rheumatic disorders of both mitral and tricuspid valves: Secondary | ICD-10-CM | POA: Diagnosis not present

## 2017-07-10 DIAGNOSIS — I350 Nonrheumatic aortic (valve) stenosis: Secondary | ICD-10-CM

## 2017-07-10 NOTE — Patient Instructions (Signed)
Medication Instructions:  Your physician recommends that you continue on your current medications as directed. Please refer to the Current Medication list given to you today.   Labwork: none  Testing/Procedures: none  Follow-Up: Your physician wants you to follow-up in: April or May with Dr. Gaspar Bidding will receive a reminder letter in the mail two months in advance. If you don't receive a letter, please call our office to schedule the follow-up appointment.   Any Other Special Instructions Will Be Listed Below (If Applicable).     If you need a refill on your cardiac medications before your next appointment, please call your pharmacy.

## 2017-07-19 ENCOUNTER — Encounter: Payer: Self-pay | Admitting: Thoracic Surgery (Cardiothoracic Vascular Surgery)

## 2017-08-12 DIAGNOSIS — R918 Other nonspecific abnormal finding of lung field: Secondary | ICD-10-CM | POA: Diagnosis not present

## 2017-08-12 DIAGNOSIS — R972 Elevated prostate specific antigen [PSA]: Secondary | ICD-10-CM | POA: Diagnosis not present

## 2017-08-12 DIAGNOSIS — Z1389 Encounter for screening for other disorder: Secondary | ICD-10-CM | POA: Diagnosis not present

## 2017-08-12 DIAGNOSIS — I25119 Atherosclerotic heart disease of native coronary artery with unspecified angina pectoris: Secondary | ICD-10-CM | POA: Diagnosis not present

## 2017-08-12 DIAGNOSIS — E782 Mixed hyperlipidemia: Secondary | ICD-10-CM | POA: Diagnosis not present

## 2017-08-12 DIAGNOSIS — E039 Hypothyroidism, unspecified: Secondary | ICD-10-CM | POA: Diagnosis not present

## 2017-08-12 DIAGNOSIS — Z79899 Other long term (current) drug therapy: Secondary | ICD-10-CM | POA: Diagnosis not present

## 2017-08-12 DIAGNOSIS — R351 Nocturia: Secondary | ICD-10-CM | POA: Diagnosis not present

## 2017-08-12 DIAGNOSIS — I350 Nonrheumatic aortic (valve) stenosis: Secondary | ICD-10-CM | POA: Diagnosis not present

## 2017-08-12 DIAGNOSIS — N400 Enlarged prostate without lower urinary tract symptoms: Secondary | ICD-10-CM | POA: Diagnosis not present

## 2017-08-12 DIAGNOSIS — Z0000 Encounter for general adult medical examination without abnormal findings: Secondary | ICD-10-CM | POA: Diagnosis not present

## 2017-08-12 DIAGNOSIS — I10 Essential (primary) hypertension: Secondary | ICD-10-CM | POA: Diagnosis not present

## 2017-08-14 ENCOUNTER — Other Ambulatory Visit: Payer: Self-pay | Admitting: Interventional Cardiology

## 2017-09-18 ENCOUNTER — Other Ambulatory Visit: Payer: Self-pay | Admitting: Interventional Cardiology

## 2017-09-20 ENCOUNTER — Other Ambulatory Visit (HOSPITAL_COMMUNITY): Payer: Medicare Other

## 2017-09-20 ENCOUNTER — Ambulatory Visit: Payer: Medicare Other | Admitting: Cardiovascular Disease

## 2017-09-24 ENCOUNTER — Other Ambulatory Visit: Payer: Self-pay | Admitting: Interventional Cardiology

## 2018-01-10 ENCOUNTER — Other Ambulatory Visit: Payer: Self-pay | Admitting: Interventional Cardiology

## 2018-02-21 ENCOUNTER — Other Ambulatory Visit: Payer: Self-pay | Admitting: Interventional Cardiology

## 2018-03-11 DIAGNOSIS — R3914 Feeling of incomplete bladder emptying: Secondary | ICD-10-CM | POA: Diagnosis not present

## 2018-03-11 DIAGNOSIS — N401 Enlarged prostate with lower urinary tract symptoms: Secondary | ICD-10-CM | POA: Diagnosis not present

## 2018-03-11 DIAGNOSIS — R8279 Other abnormal findings on microbiological examination of urine: Secondary | ICD-10-CM | POA: Diagnosis not present

## 2018-04-01 DIAGNOSIS — N3941 Urge incontinence: Secondary | ICD-10-CM | POA: Diagnosis not present

## 2018-04-01 DIAGNOSIS — N401 Enlarged prostate with lower urinary tract symptoms: Secondary | ICD-10-CM | POA: Diagnosis not present

## 2018-04-01 DIAGNOSIS — R351 Nocturia: Secondary | ICD-10-CM | POA: Diagnosis not present

## 2018-04-07 DIAGNOSIS — H353131 Nonexudative age-related macular degeneration, bilateral, early dry stage: Secondary | ICD-10-CM | POA: Diagnosis not present

## 2018-04-11 ENCOUNTER — Other Ambulatory Visit: Payer: Self-pay | Admitting: Interventional Cardiology

## 2018-05-01 DIAGNOSIS — M545 Low back pain: Secondary | ICD-10-CM | POA: Diagnosis not present

## 2018-05-01 DIAGNOSIS — M25552 Pain in left hip: Secondary | ICD-10-CM | POA: Diagnosis not present

## 2018-05-01 DIAGNOSIS — M1712 Unilateral primary osteoarthritis, left knee: Secondary | ICD-10-CM | POA: Diagnosis not present

## 2018-05-01 DIAGNOSIS — M1711 Unilateral primary osteoarthritis, right knee: Secondary | ICD-10-CM | POA: Diagnosis not present

## 2018-05-07 ENCOUNTER — Ambulatory Visit (INDEPENDENT_AMBULATORY_CARE_PROVIDER_SITE_OTHER): Payer: Medicare Other | Admitting: Interventional Cardiology

## 2018-05-07 ENCOUNTER — Encounter: Payer: Self-pay | Admitting: Interventional Cardiology

## 2018-05-07 VITALS — BP 130/70 | HR 80 | Ht 70.0 in | Wt 209.0 lb

## 2018-05-07 DIAGNOSIS — I25119 Atherosclerotic heart disease of native coronary artery with unspecified angina pectoris: Secondary | ICD-10-CM | POA: Diagnosis not present

## 2018-05-07 DIAGNOSIS — I5042 Chronic combined systolic (congestive) and diastolic (congestive) heart failure: Secondary | ICD-10-CM | POA: Diagnosis not present

## 2018-05-07 DIAGNOSIS — N3281 Overactive bladder: Secondary | ICD-10-CM | POA: Diagnosis not present

## 2018-05-07 DIAGNOSIS — Z952 Presence of prosthetic heart valve: Secondary | ICD-10-CM | POA: Diagnosis not present

## 2018-05-07 DIAGNOSIS — E785 Hyperlipidemia, unspecified: Secondary | ICD-10-CM

## 2018-05-07 DIAGNOSIS — N401 Enlarged prostate with lower urinary tract symptoms: Secondary | ICD-10-CM | POA: Diagnosis not present

## 2018-05-07 DIAGNOSIS — N3941 Urge incontinence: Secondary | ICD-10-CM | POA: Diagnosis not present

## 2018-05-07 DIAGNOSIS — I4510 Unspecified right bundle-branch block: Secondary | ICD-10-CM

## 2018-05-07 NOTE — Progress Notes (Signed)
Cardiology Office Note:    Date:  05/07/2018   ID:  Tim Campbell, DOB 24-Jul-1926, MRN [id number]  PCP:  [name], MD  Cardiologist:  No primary care provider on file.   Referring MD: [name], MD   Chief Complaint  Patient presents with  . Congestive Heart Failure  . Cardiac Valve Problem    TAVR    History of Present Illness:    Tim Campbell is a 84 y.o. male with a hx of coronary artery disease with prior bypass surgery, bypass graft with prior SV graft stent implantations, severe aortic stenosis, hyperlipidemia, hypertension, and combined systolic and diastolic heart failure with recent EF 45%. He had successful TAVR in September 2017. He is here today for clinical follow-up.  Overall feels well.  Lower extremity swelling.  Quite some time it sleeps to be increasing just left urology and had a Foley catheter placed because of greater than 1 L of urinary retention.  They feel that lower extremity swelling could be related to fluid retention.  He denies palpitations, syncope, angina, and nitroglycerin use. No orthopnea.   Past Medical History:  Diagnosis Date  . AAA (abdominal aortic aneurysm) ([place])    a. 3.3x3.0cm by CT 05/2016.  Marland Kitchen BPH (benign prostatic hyperplasia)   . Chronic combined systolic and diastolic HF (heart failure), NYHA class 3   . Coronary artery disease CARDIOLOGIST-  DR Angelena Form   a. s/p CABGx5 in 1993. b. NSTEMI with occluded VG-diag in 2005. c. BMS to prox VG-PDA 2012. d. BMS to SVG-OM 03/2014. e. stable cath 05/2016.  . Diverticulosis of colon   . Heart murmur   . History of colon polyps    2002;  2007  . History of non-ST elevation myocardial infarction (NSTEMI)    x2 --- 12-29-2003  and  01-01-2013  . Hypertension   . Hypothyroidism   . Lower urinary tract symptoms (LUTS)   . Mixed hyperlipidemia   . Non Hodgkin's lymphoma ([place]) dx 2006 bone marrow bx--- onocologist-  dr Marin Olp   s/p  chemo and radiation therpay--- in remission  since ---  . OA (osteoarthritis)   . Peripheral neuropathy   . Pulmonary nodules    a. by CT 05/2016  . RBBB (right bundle branch block)   . S/p bare metal coronary artery stent    10-16-2010  BMS to RCA graft  . S/P CABG x 5    05/ 1993  . 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 Urinary retention   . Wears dentures    upper  . Wears glasses     Past Surgical History:  Procedure Laterality Date  . CARDIAC CATHETERIZATION N/A 05/17/2016   Procedure: Right/Left Heart Cath and Coronary/Graft Angiography;  Surgeon: Nelva Bush, MD;  Location: [place] [facility];  Service: Cardiovascular;  Laterality: N/A;  severe CAD,  total occlusion of mLAD, dLCx, and mRCA, 99% ostial LCX/ wide patent LIMA to LAD & SVG to OM, patent SVG to PDA/PL w/ stable 50% ISR at ostium SVG/ chronic occluded SVG to Diagonal/ severe AVS  . CARDIAC CATHETERIZATION  12-30-2003  dr Leonia Reeves   severe 3v cad:  occlused SVG to diagonal causing NSTEMI,  ef 45%  . CATARACT EXTRACTION W/ INTRAOCULAR LENS  IMPLANT, BILATERAL    . COLONOSCOPY    . CORONARY ANGIOPLASTY WITH STENT PLACEMENT  10-20-2010  dr Daneen Schick   BMS to ostium of the RCA graft:  total occlusion LAD, CFX, and RCA,  bypass graft 's-- total occlusion SVG to diagonal,  high-grade obstruction prox.Alfredo Batty segment of RCA graft,  mid SVG to OM 50% otherwise patent/  mild AVS inferior wall hypokinesis  . CORONARY ARTERY BYPASS GRAFT  05/ 1993     dr gerhart   LIMA to LAD,  SVG to D1,  SVT to RCA,  SVG to OM,  SVG to PDA  . [place]  . I&D EXTREMITY Left 03/09/2016   Procedure: IRRIGATION AND DEBRIDEMENT WRIST;  Surgeon: Milly Jakob, MD;  Location: [place];  Service: Orthopedics;  Laterality: Left;  . INGUINAL HERNIA REPAIR Right 02/21/2006  . KNEE ARTHROSCOPY Right 1970'S   . LEFT AND RIGHT HEART CATHETERIZATION WITH CORONARY/GRAFT ANGIOGRAM N/A 04/05/2014   Procedure: LEFT AND RIGHT HEART CATHETERIZATION WITH  [name];  Surgeon: Burnell Blanks, MD;  Location: [facility];  Service: Cardiovascular;  Laterality: N/A;  . LEFT HEART CATHETERIZATION WITH CORONARY/GRAFT ANGIOGRAM N/A 01/02/2013   Procedure: LEFT HEART CATHETERIZATION WITH [name];  Surgeon: [name], MD;  Location: [facility] C[facility] LAB;  Service: Cardiovascular;  Laterality: N/A;NSTEMI:side-to-side anastomosis w/the PDA is threatened(not amenable to PCI)in-stent restenosis  RCA graft,  50% osyial SVG to OM , LIMA to LAD patent w/ diffuse distal LAD disease/  severe 3V CAD w/ occlusion LAD, CFX, RCA, ef 45%  . OPEN BX LEFT CERVICAL MASS, POSTERIOR NECK  10/03/2004  . OPEN REDUCTION INTERNAL FIXATION (ORIF) DISTAL RADIAL FRACTURE Left 02/24/2016   Procedure: OPEN TREATMENT OF LEFT DISTAL RADIUS FRACTURE;  Surgeon: Milly Jakob, MD;  Location: [place];  Service: Orthopedics;  Laterality: Left;  . ORIF RIGHT ANKLE FX  10/07/2008  . PERCUTANEOUS CORONARY STENT INTERVENTION (PCI-S) N/A 04/21/2014   Procedure: PERCUTANEOUS CORONARY STENT INTERVENTION (PCI-S);  Surgeon: [name], MD;  Location: [facility] CATH LAB;  Service: Cardiovascular;  Laterality: N/A;   BMS in the ostial/proximal SVG to CFX,  stable 50-70% ostial SVG to RCA (previous stenting)  . TEE WITHOUT CARDIOVERSION N/A 06/26/2016   Procedure: TRANSESOPHAGEAL ECHOCARDIOGRAM (TEE);  Surgeon: Burnell Blanks, MD;  Location: [place];  Service: Open Heart Surgery;  Laterality: N/A;  . THULIUM LASER TURP (TRANSURETHRAL RESECTION OF PROSTATE) N/A 12/05/2016   Procedure: THULIUM LASER TURP (TRANSURETHRAL RESECTION OF PROSTATE);  Surgeon: Nickie Retort, MD;  Location: [facility];  Service: Urology;  Laterality: N/A;  . TRANSCATHETER AORTIC VALVE REPLACEMENT, TRANSFEMORAL Bilateral 06/26/2016   Procedure: TRANSCATHETER AORTIC VALVE REPLACEMENT, TRANSFEMORAL;  Surgeon: Burnell Blanks, MD;  Location: [place];  Service: Open Heart Surgery;  Laterality:  Bilateral;  . TRANSTHORACIC ECHOCARDIOGRAM  08/03/2016    post TAVR    ef 40-45%,  akinesis and scarring of the basal-inferolateral, inferior, and inferoseptal (consistent w/ infarction in distribution of the RCA), severe hypokinesis of the basal anteroseptal (consistent w/ ischemia in distribution of the LAD but w/ patent distal graft),  grade 2 diastolic dysfunction/  AV stent valve bioprosthesis normal function w/ trivial regurg (valve area 1.16cm^2)/  . TRANSTHORACIC ECHOCARDIOGRAM  08-03-2016  continued results- post TAVR   mild dilated ascending aorta/  mild to moderate MR, peak grandient 105mmHg/  severe LAE and mild RAE/  mild PR/  trivial TR    Current Medications: Current Meds  Medication Sig  . Ascorbic Acid (VITAMIN C) 1000 MG tablet Take 1,000 mg by mouth 3 (three) times a week.   Marland Kitchen aspirin EC 81 MG tablet Take 81 mg by mouth daily.   Marland Kitchen atorvastatin (LIPITOR)  40 MG tablet TAKE 1 TABLET (40 MG TOTAL) BY MOUTH DAILY AT 6 PM. PLEASE SCHEDULE ANNUAL APPOINTMENT THANKS.  . B Complex-C (B-COMPLEX WITH VITAMIN C) tablet Take 1 tablet by mouth 3 (three) times a week.   . clopidogrel (PLAVIX) 75 MG tablet TAKE 1 TABLET BY MOUTH EVERY DAY  . folic acid (FOLVITE) 378 MCG tablet Take 800 mcg by mouth 3 (three) times a week.   . levothyroxine (SYNTHROID, LEVOTHROID) 125 MCG tablet Take 125 mcg by mouth daily before breakfast.   . Magnesium 200 MG TABS Take 200 mg by mouth daily.   . metoprolol tartrate (LOPRESSOR) 25 MG tablet Take 0.5 tablets (12.5 mg total) by mouth 2 (two) times daily.  . Multiple Vitamins-Iron (CHLORELLA PO) Take 4 tablets by mouth 2 (two) times daily. MED NAME: CHLORELLA  . nitroGLYCERIN (NITROSTAT) 0.4 MG SL tablet Place 1 tablet (0.4 mg total) under the tongue every 5 (five) minutes as needed for chest pain.  Marland Kitchen SELENIUM PO Take 1 tablet by mouth 3 (three) times a week.   . tamsulosin (FLOMAX) 0.4 MG CAPS capsule Take 0.4 mg by mouth daily.      Allergies:   Patient has  no known allergies.   Social History   Socioeconomic History  . Marital status: Married    Spouse name: Not on file  . Number of children: 3  . Years of education: Not on file  . Highest education level: Not on file  Occupational History  . 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 Financial resource strain: Not on file  . Food insecurity:    Worry: Not on file    Inability: Not on file  . Transportation needs:    Medical: Not on file    Non-medical: Not on file  Tobacco Use  . Smoking status: Former Smoker    Packs/day: 0.50    Years: 15.00    Pack years: 7.50    Types: Cigarettes    Last attempt to quit: 06/22/1961    Years since quitting: 56.9  . Smokeless tobacco: Never Used  Substance and Sexual Activity  . Alcohol use: No  . Drug use: No  . Sexual activity: Not on file  Lifestyle  . Physical activity:    Days per week: Not on file    Minutes per session: Not on file  . Stress: Not on file  Relationships  . Social connections:    Talks on phone: Not on file    Gets together: Not on file    Attends religious service: Not on file    Active member of club or organization: Not on file    Attends meetings of clubs or organizations: Not on file    Relationship status: Not on file  Other Topics Concern  . Not on file  Social History Narrative  . Not on file     Family History: The patient's family history includes Bone cancer in his brother; CAD in his brother; Colon cancer in his father; Dementia in his sister; Parkinson's disease in his brother; Stroke in his mother, sister, and sister.  ROS:   Please see the history of present illness.    Leg pain including hip and back.  Felt to have spinal stenosis and osteoarthritis of the left hip.  All other systems reviewed and are negative.  EKGs/Labs/Other Studies Reviewed:    The following studies were reviewed today: Reviewed see orthopedic note concerning arthritis  of  his spinal stenosis.  EKG:  EKG is not ordered today.    Recent Labs: No results found for requested labs within last 8760 hours.  Recent Lipid Panel No results found for: CHOL, TRIG, HDL, CHOLHDL, VLDL, LDLCALC, LDLDIRECT  Physical Exam:    VS:  BP 130/70   Pulse 80   Ht 5\' 10"  (1.778 m)   Wt 209 lb (94.8 kg)   BMI 29.99 kg/m     Wt Readings from Last 3 Encounters:  05/07/18 209 lb (94.8 kg)  07/10/17 213 lb (96.6 kg)  01/22/17 206 lb 12.8 oz (93.8 kg)     GEN: Obese, elderly, well nourished, well developed in no acute distress HEENT: Normal NECK: No JVD. LYMPHATICS: No lymphadenopathy CARDIAC: IRR, 1/6 to 2/6 systolic but no diastolic murmur, no gallop, 2-3+ bilateral from ankles to mid shin edema. VASCULAR: 2+ radial pulses.  No bruits. RESPIRATORY:  Clear to auscultation without rales, wheezing or rhonchi  ABDOMEN: Soft, non-tender, non-distended, No pulsatile mass, MUSCULOSKELETAL: No deformity  SKIN: Warm and dry NEUROLOGIC:  Alert and oriented x 3 PSYCHIATRIC:  Normal affect   ASSESSMENT:    1. S/P TAVR (transcatheter aortic valve replacement)   2. Chronic combined systolic and diastolic HF (heart failure), NYHA class 3 ([place])   3. Coronary artery disease involving native coronary artery of native heart with angina pectoris ([place])   4. RBBB   5. Hyperlipidemia, unspecified hyperlipidemia type    PLAN:    In order of problems listed above:  1. Normally functioning aortic valve.  No evidence of regurgitation. 2. Lower extremity swelling could be related to heart failure but also had significant urinary retention with Foley catheter being placed this afternoon at urology.  Allow some time to see if better urinary drainage leads to decrease in fluid retention and decreased edema.  If not, will need to resume either Spironolactone or HCTZ which were discontinued in March 2018 during a urology hospitalization. 3. This of angina, no specific changes in therapy are  indicated.  Clinical follow-up as needed or in 1 year.  May add low-dose diuretic therapy if lower extremities remain swollen after placement of Foley catheter over the next 72 hours.  If diuretics are started, a basic metabolic panel will be done 1 week after the medicine is recent.   Medication Adjustments/Labs and Tests Ordered: Current medicines are reviewed at length with the patient today.  Concerns regarding medicines are outlined above.  No orders of the defined types were placed in this encounter.  No orders of the defined types were placed in this encounter.   Patient Instructions  Medication Instructions:  Your physician recommends that you continue on your current medications as directed. Please refer to the Current Medication list given to you today.  Labwork: None  Testing/Procedures: None  Follow-Up: Your physician wants you to follow-up in: 1 year with Dr. Tamala Julian.  You will receive a reminder letter in the mail two months in advance. If you don't receive a letter, please call our office to schedule the follow-up appointment.   Any Other Special Instructions Will Be Listed Below (If Applicable).  Dr. Tamala Julian would like for you to call in 3-5 days if the swelling in your legs has not improved and let us know.    If you need a refill on your cardiac medications before your next appointment, please call your pharmacy.      Signed, [name], MD  05/07/2018 2:31 PM  [facility]

## 2018-05-07 NOTE — Patient Instructions (Signed)
Medication Instructions:  Your physician recommends that you continue on your current medications as directed. Please refer to the Current Medication list given to you today.  Labwork: None  Testing/Procedures: None  Follow-Up: Your physician wants you to follow-up in: 1 year with Dr. Tamala Julian.  You will receive a reminder letter in the mail two months in advance. If you don't receive a letter, please call our office to schedule the follow-up appointment.   Any Other Special Instructions Will Be Listed Below (If Applicable).  Dr. Tamala Julian would like for you to call in 3-5 days if the swelling in your legs has not improved and let us know.    If you need a refill on your cardiac medications before your next appointment, please call your pharmacy.

## 2018-05-08 ENCOUNTER — Other Ambulatory Visit: Payer: Self-pay | Admitting: Orthopedic Surgery

## 2018-05-08 DIAGNOSIS — M545 Low back pain: Secondary | ICD-10-CM

## 2018-05-11 ENCOUNTER — Other Ambulatory Visit: Payer: Medicare Other

## 2018-05-11 ENCOUNTER — Ambulatory Visit
Admission: RE | Admit: 2018-05-11 | Discharge: 2018-05-11 | Disposition: A | Discharge status: Home / Self Care | Payer: Medicare Other | Source: Ambulatory Visit | Attending: Orthopedic Surgery | Admitting: Orthopedic Surgery

## 2018-05-11 DIAGNOSIS — M545 Low back pain: Secondary | ICD-10-CM | POA: Diagnosis not present

## 2018-05-12 DIAGNOSIS — N401 Enlarged prostate with lower urinary tract symptoms: Secondary | ICD-10-CM | POA: Diagnosis not present

## 2018-05-12 DIAGNOSIS — R338 Other retention of urine: Secondary | ICD-10-CM | POA: Diagnosis not present

## 2018-05-12 DIAGNOSIS — N130 Hydronephrosis with ureteropelvic junction obstruction: Secondary | ICD-10-CM | POA: Diagnosis not present

## 2018-05-13 DIAGNOSIS — M545 Low back pain: Secondary | ICD-10-CM | POA: Diagnosis not present

## 2018-05-15 ENCOUNTER — Telehealth: Payer: Self-pay | Admitting: Interventional Cardiology

## 2018-05-15 ENCOUNTER — Encounter: Payer: Self-pay | Admitting: Hematology & Oncology

## 2018-05-15 NOTE — Telephone Encounter (Signed)
Pt's daughter [name] called regarding pt. Daughter states that she supposed to call to update on pt's medications. Pt was seen by Dr. Tamala Julian last week (7-31) and MD wanted to know if pt was taking his HCTZ medication because pt's legs were swollen. Daughter states that  this medication was D/C on 2018 and was never put back on this medication again. Daughter states that pt's legs continue to be swollen,and pt's health has been declining since he was seeing last week. Pt continues to be weak, and he still has the foley cathter in. Pt sleeps in the chair all the time. Pt sometimes feels cool and clammy to the touch. Pt C/O that his mouth is dry and only wants to have ice ship. Pt had C/O of back and knee pain. The PCP order an MRI [facility] on 05/11/18, then pt was  diagnosed with cancer. Daughter would like for Dr Tamala Julian to review the MRI results. Pt is aware that this message will be send to Dr. Tamala Julian for reviewing.

## 2018-05-15 NOTE — Telephone Encounter (Signed)
New Message:    She wanted Dr Tamala Julian to know that pt had MRI on 8-4-119 at [facility], he can look at that. Pt have been diagnosed with Cancer. She also was supposed to give an update on his medicine. Pt is no longer on HCTZ, but legs are still swollen, not eating and very weak.. Please call, she says she wants to talk to a nurse.

## 2018-05-16 ENCOUNTER — Telehealth: Payer: Self-pay | Admitting: Hematology & Oncology

## 2018-05-16 ENCOUNTER — Encounter: Payer: Self-pay | Admitting: Hematology & Oncology

## 2018-05-16 ENCOUNTER — Inpatient Hospital Stay (HOSPITAL_BASED_OUTPATIENT_CLINIC_OR_DEPARTMENT_OTHER)
Admission: EM | Admit: 2018-05-16 | Discharge: 2018-05-23 | DRG: 280 | Disposition: A | Discharge status: Skilled Nursing Facility | Payer: Medicare Other | Source: Ambulatory Visit | Attending: Internal Medicine | Admitting: Internal Medicine

## 2018-05-16 ENCOUNTER — Other Ambulatory Visit: Payer: Self-pay

## 2018-05-16 ENCOUNTER — Inpatient Hospital Stay: Payer: Medicare Other | Attending: Hematology & Oncology | Admitting: Hematology & Oncology

## 2018-05-16 ENCOUNTER — Encounter (HOSPITAL_BASED_OUTPATIENT_CLINIC_OR_DEPARTMENT_OTHER): Payer: Self-pay | Admitting: *Deleted

## 2018-05-16 ENCOUNTER — Inpatient Hospital Stay: Payer: Medicare Other | Attending: Hematology & Oncology

## 2018-05-16 ENCOUNTER — Emergency Department (HOSPITAL_BASED_OUTPATIENT_CLINIC_OR_DEPARTMENT_OTHER): Payer: Medicare Other

## 2018-05-16 VITALS — BP 87/46 | HR 76 | Temp 98.1°F | Resp 18

## 2018-05-16 DIAGNOSIS — R531 Weakness: Secondary | ICD-10-CM

## 2018-05-16 DIAGNOSIS — Z8546 Personal history of malignant neoplasm of prostate: Secondary | ICD-10-CM | POA: Diagnosis not present

## 2018-05-16 DIAGNOSIS — I472 Ventricular tachycardia, unspecified: Secondary | ICD-10-CM

## 2018-05-16 DIAGNOSIS — C799 Secondary malignant neoplasm of unspecified site: Secondary | ICD-10-CM

## 2018-05-16 DIAGNOSIS — Z466 Encounter for fitting and adjustment of urinary device: Secondary | ICD-10-CM | POA: Diagnosis not present

## 2018-05-16 DIAGNOSIS — D6959 Other secondary thrombocytopenia: Secondary | ICD-10-CM | POA: Diagnosis present

## 2018-05-16 DIAGNOSIS — I21A1 Myocardial infarction type 2: Secondary | ICD-10-CM | POA: Diagnosis not present

## 2018-05-16 DIAGNOSIS — R079 Chest pain, unspecified: Secondary | ICD-10-CM | POA: Diagnosis not present

## 2018-05-16 DIAGNOSIS — Z952 Presence of prosthetic heart valve: Secondary | ICD-10-CM | POA: Diagnosis not present

## 2018-05-16 DIAGNOSIS — R05 Cough: Secondary | ICD-10-CM | POA: Diagnosis not present

## 2018-05-16 DIAGNOSIS — B952 Enterococcus as the cause of diseases classified elsewhere: Secondary | ICD-10-CM | POA: Diagnosis present

## 2018-05-16 DIAGNOSIS — M6281 Muscle weakness (generalized): Secondary | ICD-10-CM | POA: Diagnosis not present

## 2018-05-16 DIAGNOSIS — D696 Thrombocytopenia, unspecified: Secondary | ICD-10-CM

## 2018-05-16 DIAGNOSIS — Z800 Family history of malignant neoplasm of digestive organs: Secondary | ICD-10-CM | POA: Diagnosis not present

## 2018-05-16 DIAGNOSIS — Z9221 Personal history of antineoplastic chemotherapy: Secondary | ICD-10-CM

## 2018-05-16 DIAGNOSIS — Z7982 Long term (current) use of aspirin: Secondary | ICD-10-CM

## 2018-05-16 DIAGNOSIS — K5730 Diverticulosis of large intestine without perforation or abscess without bleeding: Secondary | ICD-10-CM | POA: Diagnosis not present

## 2018-05-16 DIAGNOSIS — I110 Hypertensive heart disease with heart failure: Secondary | ICD-10-CM | POA: Diagnosis present

## 2018-05-16 DIAGNOSIS — I5043 Acute on chronic combined systolic (congestive) and diastolic (congestive) heart failure: Secondary | ICD-10-CM | POA: Diagnosis present

## 2018-05-16 DIAGNOSIS — I361 Nonrheumatic tricuspid (valve) insufficiency: Secondary | ICD-10-CM | POA: Diagnosis not present

## 2018-05-16 DIAGNOSIS — R599 Enlarged lymph nodes, unspecified: Secondary | ICD-10-CM

## 2018-05-16 DIAGNOSIS — Z923 Personal history of irradiation: Secondary | ICD-10-CM

## 2018-05-16 DIAGNOSIS — Z8572 Personal history of non-Hodgkin lymphomas: Secondary | ICD-10-CM

## 2018-05-16 DIAGNOSIS — I25810 Atherosclerosis of coronary artery bypass graft(s) without angina pectoris: Secondary | ICD-10-CM | POA: Diagnosis present

## 2018-05-16 DIAGNOSIS — I4510 Unspecified right bundle-branch block: Secondary | ICD-10-CM | POA: Diagnosis present

## 2018-05-16 DIAGNOSIS — K5900 Constipation, unspecified: Secondary | ICD-10-CM | POA: Diagnosis present

## 2018-05-16 DIAGNOSIS — R609 Edema, unspecified: Secondary | ICD-10-CM | POA: Diagnosis not present

## 2018-05-16 DIAGNOSIS — R600 Localized edema: Secondary | ICD-10-CM | POA: Diagnosis present

## 2018-05-16 DIAGNOSIS — Z87891 Personal history of nicotine dependence: Secondary | ICD-10-CM

## 2018-05-16 DIAGNOSIS — J9811 Atelectasis: Secondary | ICD-10-CM | POA: Diagnosis not present

## 2018-05-16 DIAGNOSIS — I959 Hypotension, unspecified: Secondary | ICD-10-CM

## 2018-05-16 DIAGNOSIS — M62838 Other muscle spasm: Secondary | ICD-10-CM | POA: Diagnosis not present

## 2018-05-16 DIAGNOSIS — C801 Malignant (primary) neoplasm, unspecified: Secondary | ICD-10-CM | POA: Diagnosis not present

## 2018-05-16 DIAGNOSIS — N179 Acute kidney failure, unspecified: Secondary | ICD-10-CM | POA: Diagnosis present

## 2018-05-16 DIAGNOSIS — R278 Other lack of coordination: Secondary | ICD-10-CM | POA: Diagnosis not present

## 2018-05-16 DIAGNOSIS — D630 Anemia in neoplastic disease: Secondary | ICD-10-CM | POA: Diagnosis present

## 2018-05-16 DIAGNOSIS — M899 Disorder of bone, unspecified: Secondary | ICD-10-CM

## 2018-05-16 DIAGNOSIS — R41841 Cognitive communication deficit: Secondary | ICD-10-CM | POA: Diagnosis not present

## 2018-05-16 DIAGNOSIS — C61 Malignant neoplasm of prostate: Secondary | ICD-10-CM | POA: Diagnosis present

## 2018-05-16 DIAGNOSIS — I214 Non-ST elevation (NSTEMI) myocardial infarction: Secondary | ICD-10-CM | POA: Diagnosis present

## 2018-05-16 DIAGNOSIS — I714 Abdominal aortic aneurysm, without rupture, unspecified: Secondary | ICD-10-CM | POA: Diagnosis present

## 2018-05-16 DIAGNOSIS — R338 Other retention of urine: Secondary | ICD-10-CM | POA: Diagnosis present

## 2018-05-16 DIAGNOSIS — I5042 Chronic combined systolic (congestive) and diastolic (congestive) heart failure: Secondary | ICD-10-CM | POA: Diagnosis not present

## 2018-05-16 DIAGNOSIS — Z808 Family history of malignant neoplasm of other organs or systems: Secondary | ICD-10-CM

## 2018-05-16 DIAGNOSIS — C7951 Secondary malignant neoplasm of bone: Secondary | ICD-10-CM | POA: Diagnosis present

## 2018-05-16 DIAGNOSIS — R2681 Unsteadiness on feet: Secondary | ICD-10-CM | POA: Diagnosis not present

## 2018-05-16 DIAGNOSIS — Y846 Urinary catheterization as the cause of abnormal reaction of the patient, or of later complication, without mention of misadventure at the time of the procedure: Secondary | ICD-10-CM | POA: Diagnosis present

## 2018-05-16 DIAGNOSIS — E876 Hypokalemia: Secondary | ICD-10-CM | POA: Diagnosis present

## 2018-05-16 DIAGNOSIS — E039 Hypothyroidism, unspecified: Secondary | ICD-10-CM | POA: Diagnosis present

## 2018-05-16 DIAGNOSIS — I5023 Acute on chronic systolic (congestive) heart failure: Secondary | ICD-10-CM | POA: Diagnosis not present

## 2018-05-16 DIAGNOSIS — E875 Hyperkalemia: Secondary | ICD-10-CM | POA: Diagnosis not present

## 2018-05-16 DIAGNOSIS — N390 Urinary tract infection, site not specified: Secondary | ICD-10-CM | POA: Diagnosis present

## 2018-05-16 DIAGNOSIS — E8809 Other disorders of plasma-protein metabolism, not elsewhere classified: Secondary | ICD-10-CM | POA: Diagnosis present

## 2018-05-16 DIAGNOSIS — R9721 Rising PSA following treatment for malignant neoplasm of prostate: Secondary | ICD-10-CM | POA: Diagnosis present

## 2018-05-16 DIAGNOSIS — Z79899 Other long term (current) drug therapy: Secondary | ICD-10-CM | POA: Diagnosis not present

## 2018-05-16 DIAGNOSIS — Z7902 Long term (current) use of antithrombotics/antiplatelets: Secondary | ICD-10-CM

## 2018-05-16 DIAGNOSIS — T83511A Infection and inflammatory reaction due to indwelling urethral catheter, initial encounter: Secondary | ICD-10-CM | POA: Diagnosis present

## 2018-05-16 DIAGNOSIS — E785 Hyperlipidemia, unspecified: Secondary | ICD-10-CM | POA: Diagnosis not present

## 2018-05-16 DIAGNOSIS — I429 Cardiomyopathy, unspecified: Secondary | ICD-10-CM | POA: Diagnosis present

## 2018-05-16 DIAGNOSIS — D7589 Other specified diseases of blood and blood-forming organs: Secondary | ICD-10-CM | POA: Diagnosis not present

## 2018-05-16 DIAGNOSIS — I252 Old myocardial infarction: Secondary | ICD-10-CM

## 2018-05-16 DIAGNOSIS — R52 Pain, unspecified: Secondary | ICD-10-CM | POA: Diagnosis not present

## 2018-05-16 DIAGNOSIS — Z8249 Family history of ischemic heart disease and other diseases of the circulatory system: Secondary | ICD-10-CM

## 2018-05-16 DIAGNOSIS — N401 Enlarged prostate with lower urinary tract symptoms: Secondary | ICD-10-CM | POA: Diagnosis present

## 2018-05-16 DIAGNOSIS — I2510 Atherosclerotic heart disease of native coronary artery without angina pectoris: Secondary | ICD-10-CM | POA: Diagnosis not present

## 2018-05-16 DIAGNOSIS — E782 Mixed hyperlipidemia: Secondary | ICD-10-CM | POA: Diagnosis present

## 2018-05-16 DIAGNOSIS — B961 Klebsiella pneumoniae [K. pneumoniae] as the cause of diseases classified elsewhere: Secondary | ICD-10-CM | POA: Diagnosis present

## 2018-05-16 DIAGNOSIS — Z86010 Personal history of colonic polyps: Secondary | ICD-10-CM

## 2018-05-16 DIAGNOSIS — G4700 Insomnia, unspecified: Secondary | ICD-10-CM | POA: Diagnosis not present

## 2018-05-16 DIAGNOSIS — C8590 Non-Hodgkin lymphoma, unspecified, unspecified site: Secondary | ICD-10-CM | POA: Diagnosis not present

## 2018-05-16 DIAGNOSIS — N400 Enlarged prostate without lower urinary tract symptoms: Secondary | ICD-10-CM | POA: Diagnosis not present

## 2018-05-16 DIAGNOSIS — Z79890 Hormone replacement therapy (postmenopausal): Secondary | ICD-10-CM

## 2018-05-16 DIAGNOSIS — Z955 Presence of coronary angioplasty implant and graft: Secondary | ICD-10-CM

## 2018-05-16 DIAGNOSIS — R748 Abnormal levels of other serum enzymes: Secondary | ICD-10-CM | POA: Diagnosis not present

## 2018-05-16 LAB — URINALYSIS, MICROSCOPIC (REFLEX)

## 2018-05-16 LAB — TROPONIN I: TROPONIN I: 1.73 ng/mL — AB (ref <0.03)

## 2018-05-16 LAB — LACTATE DEHYDROGENASE: LDH: 387 U/L — AB (ref 98–192)

## 2018-05-16 LAB — COMPREHENSIVE METABOLIC PANEL
ALK PHOS: 69 U/L (ref 38–126)
ALT: 44 U/L (ref 0–44)
AST: 101 U/L — ABNORMAL HIGH (ref 15–41)
Albumin: 2.9 g/dL — ABNORMAL LOW (ref 3.5–5.0)
Anion gap: 10 (ref 5–15)
BUN: 36 mg/dL — ABNORMAL HIGH (ref 8–23)
CALCIUM: 7.9 mg/dL — AB (ref 8.9–10.3)
CO2: 27 mmol/L (ref 22–32)
CREATININE: 1.33 mg/dL — AB (ref 0.61–1.24)
Chloride: 98 mmol/L (ref 98–111)
GFR calc Af Amer: 53 mL/min — ABNORMAL LOW (ref >60)
GFR, EST NON AFRICAN AMERICAN: 46 mL/min — AB (ref >60)
Glucose, Bld: 132 mg/dL — ABNORMAL HIGH (ref 70–99)
Potassium: 3.3 mmol/L — ABNORMAL LOW (ref 3.5–5.1)
Sodium: 135 mmol/L (ref 135–145)
Total Bilirubin: 1 mg/dL (ref 0.3–1.2)
Total Protein: 5.7 g/dL — ABNORMAL LOW (ref 6.5–8.1)

## 2018-05-16 LAB — CBC WITH DIFFERENTIAL/PLATELET
Basophils Absolute: 0 10*3/uL (ref 0.0–0.1)
Basophils Relative: 0 %
Eosinophils Absolute: 0 10*3/uL (ref 0.0–0.7)
Eosinophils Relative: 0 %
HEMATOCRIT: 35.8 % — AB (ref 39.0–52.0)
HEMOGLOBIN: 12 g/dL — AB (ref 13.0–17.0)
LYMPHS ABS: 0.7 10*3/uL (ref 0.7–4.0)
LYMPHS PCT: 12 %
MCH: 27.4 pg (ref 26.0–34.0)
MCHC: 33.5 g/dL (ref 30.0–36.0)
MCV: 81.7 fL (ref 78.0–100.0)
Monocytes Absolute: 0.6 10*3/uL (ref 0.1–1.0)
Monocytes Relative: 10 %
NEUTROS PCT: 78 %
Neutro Abs: 4.4 10*3/uL (ref 1.7–7.7)
Platelets: 75 10*3/uL — ABNORMAL LOW (ref 150–400)
RBC: 4.38 MIL/uL (ref 4.22–5.81)
RDW: 15.7 % — ABNORMAL HIGH (ref 11.5–15.5)
WBC: 5.7 10*3/uL (ref 4.0–10.5)

## 2018-05-16 LAB — URINALYSIS, ROUTINE W REFLEX MICROSCOPIC
BILIRUBIN URINE: NEGATIVE
Glucose, UA: NEGATIVE mg/dL
Ketones, ur: NEGATIVE mg/dL
Nitrite: NEGATIVE
Protein, ur: 100 mg/dL — AB
SPECIFIC GRAVITY, URINE: 1.02 (ref 1.005–1.030)
pH: 6 (ref 5.0–8.0)

## 2018-05-16 LAB — CMP (CANCER CENTER ONLY)
ALT: 46 U/L — AB (ref 0–44)
ANION GAP: 11 (ref 5–15)
AST: 103 U/L — ABNORMAL HIGH (ref 15–41)
Albumin: 2.8 g/dL — ABNORMAL LOW (ref 3.5–5.0)
Alkaline Phosphatase: 87 U/L (ref 38–126)
BUN: 34 mg/dL — ABNORMAL HIGH (ref 8–23)
CHLORIDE: 99 mmol/L (ref 98–111)
CO2: 25 mmol/L (ref 22–32)
Calcium: 8 mg/dL — ABNORMAL LOW (ref 8.9–10.3)
Creatinine: 1.16 mg/dL (ref 0.61–1.24)
GFR, Est AFR Am: >60 mL/min (ref >60)
GFR, Estimated: 54 mL/min — ABNORMAL LOW (ref >60)
Glucose, Bld: 112 mg/dL — ABNORMAL HIGH (ref 70–99)
POTASSIUM: 3.7 mmol/L (ref 3.5–5.1)
SODIUM: 135 mmol/L (ref 135–145)
Total Bilirubin: 1 mg/dL (ref 0.3–1.2)
Total Protein: 5.6 g/dL — ABNORMAL LOW (ref 6.5–8.1)

## 2018-05-16 LAB — CBC WITH DIFFERENTIAL (CANCER CENTER ONLY)
BASOS ABS: 0 10*3/uL (ref 0.0–0.1)
BASOS PCT: 0 %
Eosinophils Absolute: 0 10*3/uL (ref 0.0–0.5)
Eosinophils Relative: 0 %
HCT: 35.4 % — ABNORMAL LOW (ref 38.7–49.9)
HEMOGLOBIN: 11.7 g/dL — AB (ref 13.0–17.1)
LYMPHS ABS: 0.6 10*3/uL — AB (ref 0.9–3.3)
Lymphocytes Relative: 11 %
MCH: 27.4 pg — ABNORMAL LOW (ref 28.0–33.4)
MCHC: 33.1 g/dL (ref 32.0–35.9)
MCV: 82.9 fL (ref 82.0–98.0)
Monocytes Absolute: 0.4 10*3/uL (ref 0.1–0.9)
Monocytes Relative: 8 %
NEUTROS PCT: 81 %
Neutro Abs: 4.3 10*3/uL (ref 1.5–6.5)
Platelet Count: 78 10*3/uL — ABNORMAL LOW (ref 145–400)
RBC: 4.27 MIL/uL (ref 4.20–5.70)
RDW: 15.4 % (ref 11.1–15.7)
WBC: 5.3 10*3/uL (ref 4.0–10.0)

## 2018-05-16 LAB — BRAIN NATRIURETIC PEPTIDE: B Natriuretic Peptide: 855.2 pg/mL — ABNORMAL HIGH (ref 0.0–100.0)

## 2018-05-16 LAB — I-STAT CG4 LACTIC ACID, ED: LACTIC ACID, VENOUS: 1.5 mmol/L (ref 0.5–1.9)

## 2018-05-16 LAB — OCCULT BLOOD X 1 CARD TO LAB, STOOL: Fecal Occult Bld: NEGATIVE

## 2018-05-16 MED ORDER — SODIUM CHLORIDE 0.9 % IV SOLN
1.0000 g | INTRAVENOUS | Status: DC
  Administered 2018-05-17 – 2018-05-18 (×2): 1 g via INTRAVENOUS
  Filled 2018-05-16 (×2): qty 10

## 2018-05-16 MED ORDER — ONDANSETRON HCL 4 MG/2ML IJ SOLN
4.0000 mg | Freq: Once | INTRAMUSCULAR | Status: AC
  Administered 2018-05-16: 4 mg via INTRAVENOUS

## 2018-05-16 MED ORDER — ONDANSETRON HCL 4 MG PO TABS: 4.0000 mg | ORAL_TABLET | Freq: Four times a day (QID) | ORAL | Status: DC | PRN

## 2018-05-16 MED ORDER — NITROGLYCERIN 0.4 MG SL SUBL: 0.4000 mg | SUBLINGUAL_TABLET | SUBLINGUAL | Status: DC | PRN

## 2018-05-16 MED ORDER — FUROSEMIDE 10 MG/ML IJ SOLN
40.0000 mg | Freq: Once | INTRAMUSCULAR | Status: AC
  Administered 2018-05-16: 40 mg via INTRAVENOUS
  Filled 2018-05-16: qty 4

## 2018-05-16 MED ORDER — ACETAMINOPHEN 325 MG PO TABS
650.0000 mg | ORAL_TABLET | Freq: Four times a day (QID) | ORAL | Status: DC | PRN
  Administered 2018-05-17 – 2018-05-18 (×2): 650 mg via ORAL
  Filled 2018-05-16 (×2): qty 2

## 2018-05-16 MED ORDER — SODIUM CHLORIDE 0.9 % IV BOLUS: 1000.0000 mL | Freq: Once | INTRAVENOUS | Status: DC

## 2018-05-16 MED ORDER — TAMSULOSIN HCL 0.4 MG PO CAPS
0.4000 mg | ORAL_CAPSULE | Freq: Every day | ORAL | Status: DC
  Administered 2018-05-17 – 2018-05-23 (×7): 0.4 mg via ORAL
  Filled 2018-05-16 (×8): qty 1

## 2018-05-16 MED ORDER — CLOPIDOGREL BISULFATE 75 MG PO TABS
75.0000 mg | ORAL_TABLET | Freq: Every day | ORAL | Status: DC
  Administered 2018-05-17 – 2018-05-18 (×2): 75 mg via ORAL
  Filled 2018-05-16 (×2): qty 1

## 2018-05-16 MED ORDER — METOPROLOL TARTRATE 12.5 MG HALF TABLET
12.5000 mg | ORAL_TABLET | Freq: Two times a day (BID) | ORAL | Status: DC
  Administered 2018-05-17 – 2018-05-18 (×3): 12.5 mg via ORAL
  Filled 2018-05-16 (×3): qty 1

## 2018-05-16 MED ORDER — MAGNESIUM 200 MG PO TABS: 200.0000 mg | ORAL_TABLET | Freq: Every day | ORAL | Status: DC

## 2018-05-16 MED ORDER — TIZANIDINE HCL 4 MG PO TABS
2.0000 mg | ORAL_TABLET | Freq: Every evening | ORAL | Status: DC | PRN
  Administered 2018-05-21 – 2018-05-22 (×3): 2 mg via ORAL
  Filled 2018-05-16 (×4): qty 1

## 2018-05-16 MED ORDER — ONDANSETRON HCL 4 MG/2ML IJ SOLN: 4.0000 mg | Freq: Four times a day (QID) | INTRAMUSCULAR | Status: DC | PRN

## 2018-05-16 MED ORDER — ASPIRIN EC 81 MG PO TBEC
81.0000 mg | DELAYED_RELEASE_TABLET | Freq: Every day | ORAL | Status: DC
  Administered 2018-05-17 – 2018-05-23 (×7): 81 mg via ORAL
  Filled 2018-05-16 (×7): qty 1

## 2018-05-16 MED ORDER — ONDANSETRON HCL 4 MG/2ML IJ SOLN
INTRAMUSCULAR | Status: AC
  Filled 2018-05-16: qty 2

## 2018-05-16 MED ORDER — ACETAMINOPHEN 650 MG RE SUPP: 650.0000 mg | Freq: Four times a day (QID) | RECTAL | Status: DC | PRN

## 2018-05-16 MED ORDER — LEVOTHYROXINE SODIUM 25 MCG PO TABS
125.0000 ug | ORAL_TABLET | Freq: Every day | ORAL | Status: DC
  Administered 2018-05-17 – 2018-05-23 (×7): 125 ug via ORAL
  Filled 2018-05-16 (×8): qty 1

## 2018-05-16 MED ORDER — FOLIC ACID 1 MG PO TABS
1.0000 mg | ORAL_TABLET | ORAL | Status: DC
  Administered 2018-05-19 – 2018-05-23 (×3): 1 mg via ORAL
  Filled 2018-05-16 (×3): qty 1

## 2018-05-16 NOTE — Progress Notes (Signed)
Referral MD  Reason for Referral: Bony lesions; weakness; leg edema; hypotension  Chief Complaint  Patient presents with  . New Patient (Initial Visit)  : Patient was not sure why he was here.  HPI: Tim Campbell is a 84 year old white male.  Apparently, I had treated him about 13 years ago for lymphoma.  I have trying to get information about this.  He had been complaining of some back pain.  He ultimately was seen by Dr. Mayer Camel of [facility] orthopedic surgery.  Dr. Mayer Camel ordered an MRI.  I think he had a prior x-ray which showed some abnormalities.  The MRI was done on May 11, 2018.  Surprisingly, this showed multiple bony lesions scattered throughout the spine sacrum and iliac bones.  There is no cord compression.  He had degenerative disc disease.  He comes in in a wheelchair.  His family says is not able to walk because of weakness.  He said he has been declining very quickly.  Apparently he had a recent PSA of 92.  He has a indwelling Foley catheter.  He is being seen by urology.  He really is not eating well.  He is not hungry.  He has had no bleeding.  He has had no fever.  He is lost some weight.  He used to smoke but probably has not smoked for about 60 years.  He has had no cough.  There is no obvious shortness of breath.  He has significant bilateral lower extremity edema.   Currently, his performance status is ECOG 3.    Past Medical History:  Diagnosis Date  . AAA (abdominal aortic aneurysm) ([place])    a. 3.3x3.0cm by CT 05/2016.  Marland Kitchen BPH (benign prostatic hyperplasia)   . Chronic combined systolic and diastolic HF (heart failure), NYHA class 3   . Coronary artery disease CARDIOLOGIST-  DR Angelena Form   a. s/p CABGx5 in 1993. b. NSTEMI with occluded VG-diag in 2005. c. BMS to prox VG-PDA 2012. d. BMS to SVG-OM 03/2014. e. stable cath 05/2016.  . Diverticulosis of colon   . Heart murmur   . History of colon polyps    2002;  2007  . History of non-ST elevation  myocardial infarction (NSTEMI)    x2 --- 12-29-2003  and  01-01-2013  . Hypertension   . Hypothyroidism   . Lower urinary tract symptoms (LUTS)   . Mixed hyperlipidemia   . Non Hodgkin's lymphoma ([place]) dx 2006 bone marrow bx--- onocologist-  dr Marin Olp   s/p  chemo and radiation therpay--- in remission since ---  . OA (osteoarthritis)   . Peripheral neuropathy   . Pulmonary nodules    a. by CT 05/2016  . RBBB (right bundle branch block)   . S/p bare metal coronary artery stent    10-16-2010  BMS to RCA graft  . S/P CABG x 5    05/ 1993  . 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 Urinary retention   . Wears dentures    upper  . Wears glasses   :  Past Surgical History:  Procedure Laterality Date  . CARDIAC CATHETERIZATION N/A 05/17/2016   Procedure: Right/Left Heart Cath and Coronary/Graft Angiography;  Surgeon: Nelva Bush, MD;  Location: [place] [facility];  Service: Cardiovascular;  Laterality: N/A;  severe CAD,  total occlusion of mLAD, dLCx, and mRCA, 99% ostial LCX/ wide patent LIMA to LAD & SVG to OM, patent SVG to PDA/PL w/ stable 50% ISR at ostium SVG/  chronic occluded SVG to Diagonal/ severe AVS  . CARDIAC CATHETERIZATION  12-30-2003  dr Leonia Reeves   severe 3v cad:  occlused SVG to diagonal causing NSTEMI,  ef 45%  . CATARACT EXTRACTION W/ INTRAOCULAR LENS  IMPLANT, BILATERAL    . COLONOSCOPY    . CORONARY ANGIOPLASTY WITH STENT PLACEMENT  10-20-2010  dr Daneen Schick   BMS to ostium of the RCA graft:  total occlusion LAD, CFX, and RCA,  bypass graft 's-- total occlusion SVG to diagonal,  high-grade obstruction prox.Alfredo Batty segment of RCA graft,  mid SVG to OM 50% otherwise patent/  mild AVS inferior wall hypokinesis  . CORONARY ARTERY BYPASS GRAFT  05/ 1993     dr gerhart   LIMA to LAD,  SVG to D1,  SVT to RCA,  SVG to OM,  SVG to PDA  . [place]  . I&D EXTREMITY Left 03/09/2016   Procedure: IRRIGATION AND DEBRIDEMENT WRIST;  Surgeon:  Milly Jakob, MD;  Location: [place];  Service: Orthopedics;  Laterality: Left;  . INGUINAL HERNIA REPAIR Right 02/21/2006  . KNEE ARTHROSCOPY Right 1970'S   . LEFT AND RIGHT HEART CATHETERIZATION WITH CORONARY/GRAFT ANGIOGRAM N/A 04/05/2014   Procedure: LEFT AND RIGHT HEART CATHETERIZATION WITH [name];  Surgeon: Burnell Blanks, MD;  Location: [facility] C[facility] LAB;  Service: Cardiovascular;  Laterality: N/A;  . LEFT HEART CATHETERIZATION WITH CORONARY/GRAFT ANGIOGRAM N/A 01/02/2013   Procedure: LEFT HEART CATHETERIZATION WITH [name];  Surgeon: [name], MD;  Location: [facility] CATH LAB;  Service: Cardiovascular;  Laterality: N/A;NSTEMI:side-to-side anastomosis w/the PDA is threatened(not amenable to PCI)in-stent restenosis  RCA graft,  50% osyial SVG to OM , LIMA to LAD patent w/ diffuse distal LAD disease/  severe 3V CAD w/ occlusion LAD, CFX, RCA, ef 45%  . OPEN BX LEFT CERVICAL MASS, POSTERIOR NECK  10/03/2004  . OPEN REDUCTION INTERNAL FIXATION (ORIF) DISTAL RADIAL FRACTURE Left 02/24/2016   Procedure: OPEN TREATMENT OF LEFT DISTAL RADIUS FRACTURE;  Surgeon: Milly Jakob, MD;  Location: [place];  Service: Orthopedics;  Laterality: Left;  . ORIF RIGHT ANKLE FX  10/07/2008  . PERCUTANEOUS CORONARY STENT INTERVENTION (PCI-S) N/A 04/21/2014   Procedure: PERCUTANEOUS CORONARY STENT INTERVENTION (PCI-S);  Surgeon: [name], MD;  Location[facility];  Service: Cardiovascular;  Laterality: N/A;   BMS in the ostial/proximal SVG to CFX,  stable 50-70% ostial SVG to RCA (previous stenting)  . TEE WITHOUT CARDIOVERSION N/A 06/26/2016   Procedure: TRANSESOPHAGEAL ECHOCARDIOGRAM (TEE);  Surgeon: Burnell Blanks, MD;  Location: [place];  Service: Open Heart Surgery;  Laterality: N/A;  . THULIUM LASER TURP (TRANSURETHRAL RESECTION OF PROSTATE) N/A 12/05/2016   Procedure: THULIUM LASER TURP (TRANSURETHRAL RESECTION OF PROSTATE);  Surgeon: Nickie Retort, MD;   Location: [facility];  Service: Urology;  Laterality: N/A;  . TRANSCATHETER AORTIC VALVE REPLACEMENT, TRANSFEMORAL Bilateral 06/26/2016   Procedure: TRANSCATHETER AORTIC VALVE REPLACEMENT, TRANSFEMORAL;  Surgeon: Burnell Blanks, MD;  Location: [place];  Service: Open Heart Surgery;  Laterality: Bilateral;  . TRANSTHORACIC ECHOCARDIOGRAM  08/03/2016    post TAVR    ef 40-45%,  akinesis and scarring of the basal-inferolateral, inferior, and inferoseptal (consistent w/ infarction in distribution of the RCA), severe hypokinesis of the basal anteroseptal (consistent w/ ischemia in distribution of the LAD but w/ patent distal graft),  grade 2 diastolic dysfunction/  AV stent valve bioprosthesis normal function w/ trivial regurg (valve area 1.16cm^2)/  . TRANSTHORACIC ECHOCARDIOGRAM  08-03-2016  continued results- post TAVR   mild dilated ascending aorta/  mild to moderate MR, peak grandient 47mmHg/  severe LAE and mild RAE/  mild PR/  trivial TR  :   Current Outpatient Medications:  .  acetaminophen (TYLENOL) 500 MG tablet, Take 500 mg by mouth as needed., Disp: , Rfl:  .  GARLIC PO, Take by mouth., Disp: , Rfl:  .  RESVERATROL PO, Take by mouth., Disp: , Rfl:  .  Ascorbic Acid (VITAMIN C) 1000 MG tablet, Take 1,000 mg by mouth 3 (three) times a week. , Disp: , Rfl:  .  aspirin EC 81 MG tablet, Take 81 mg by mouth daily. , Disp: , Rfl:  .  atorvastatin (LIPITOR) 40 MG tablet, TAKE 1 TABLET (40 MG TOTAL) BY MOUTH DAILY AT 6 PM. PLEASE SCHEDULE ANNUAL APPOINTMENT THANKS., Disp: 90 tablet, Rfl: 0 .  B Complex-C (B-COMPLEX WITH VITAMIN C) tablet, Take 1 tablet by mouth 3 (three) times a week. , Disp: , Rfl:  .  clopidogrel (PLAVIX) 75 MG tablet, TAKE 1 TABLET BY MOUTH EVERY DAY, Disp: 90 tablet, Rfl: 1 .  folic acid (FOLVITE) 893 MCG tablet, Take 800 mcg by mouth 3 (three) times a week. , Disp: , Rfl:  .  levothyroxine (SYNTHROID, LEVOTHROID) 125 MCG tablet, Take 125 mcg by mouth daily before breakfast. ,  Disp: , Rfl: 3 .  Magnesium 200 MG TABS, Take 200 mg by mouth daily. , Disp: , Rfl:  .  metoprolol tartrate (LOPRESSOR) 25 MG tablet, Take 0.5 tablets (12.5 mg total) by mouth 2 (two) times daily., Disp: 90 tablet, Rfl: 3 .  Multiple Vitamins-Iron (CHLORELLA PO), Take 4 tablets by mouth 2 (two) times daily. MED NAME: CHLORELLA, Disp: , Rfl:  .  nitroGLYCERIN (NITROSTAT) 0.4 MG SL tablet, Place 1 tablet (0.4 mg total) under the tongue every 5 (five) minutes as needed for chest pain., Disp: 25 tablet, Rfl: 11 .  SELENIUM PO, Take 1 tablet by mouth 3 (three) times a week. , Disp: , Rfl:  .  tamsulosin (FLOMAX) 0.4 MG CAPS capsule, Take 0.4 mg by mouth daily. , Disp: , Rfl: 11 .  tiZANidine (ZANAFLEX) 2 MG tablet, Take 2 mg by mouth., Disp: , Rfl: :  :  No Known Allergies:  Family History  Problem Relation Age of Onset  . Colon cancer Father   . Stroke Mother   . CAD Brother   . Stroke Sister   . Bone cancer Brother   . Parkinson's disease Brother   . Dementia Sister   . Stroke Sister   :  Social History   Socioeconomic History  . Marital status: Married    Spouse name: Not on file  . Number of children: 3  . Years of education: Not on file  . Highest education level: Not on file  Occupational History  . 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 Financial resource strain: Not on file  . Food insecurity:    Worry: Not on file    Inability: Not on file  . Transportation needs:    Medical: Not on file    Non-medical: Not on file  Tobacco Use  . Smoking status: Former Smoker    Packs/day: 0.50    Years: 15.00    Pack years: 7.50    Types: Cigarettes    Last attempt to quit: 06/22/1961    Years since quitting: 56.9  . Smokeless tobacco: Never  Used  Substance and Sexual Activity  . Alcohol use: No  . Drug use: No  . Sexual activity: Not on file  Lifestyle  . Physical activity:    Days per week: Not on file    Minutes per  session: Not on file  . Stress: Not on file  Relationships  . Social connections:    Talks on phone: Not on file    Gets together: Not on file    Attends religious service: Not on file    Active member of club or organization: Not on file    Attends meetings of clubs or organizations: Not on file    Relationship status: Not on file  . Intimate partner violence:    Fear of current or ex partner: Not on file    Emotionally abused: Not on file    Physically abused: Not on file    Forced sexual activity: Not on file  Other Topics Concern  . Not on file  Social History Narrative  . Not on file  :  Review of Systems  Constitutional: Positive for malaise/fatigue and weight loss.  HENT: Negative.   Eyes: Negative.   Respiratory: Positive for shortness of breath.   Cardiovascular: Positive for leg swelling.  Gastrointestinal: Positive for abdominal pain and diarrhea.  Genitourinary: Positive for frequency.  Musculoskeletal: Positive for back pain, falls and joint pain.  Skin: Negative.   Neurological: Positive for weakness.  Endo/Heme/Allergies: Negative.      Exam: Chronically ill-appearing white male in no obvious distress.  Vital signs show a temperature 98.1.  Pulse 76.  Blood pressure 87/46.  Weight cannot be taken secondary to weakness.  Head neck exam shows no ocular or oral lesions.  He has no palpable cervical or supraclavicular lymph nodes.  Oral exam shows dry oral mucosa.  Thyroid is nonpalpable.  Lungs are with slight decrease at the bases.  Cardiac exam regular rate and rhythm.  He has 1/6 systolic ejection murmur.  Axillary exam shows bilateral axillary lymphadenopathy.  Lymph nodes probably measured about 1-2 cm.  There are mobile and nontender.  Abdomen is soft.  No fluid wave is noted.  He has no abdominal mass.  There is no palpable liver or spleen tip.  Back exam shows no tenderness over the spine, ribs or hips.  Extremities shows 2-3+ edema in both legs bilaterally.   Skin exam shows no rashes.  Neurological exam shows no focal neurological deficits. @IPVITALS @   Recent Labs    05/16/18 1226  WBC 5.3  HGB 11.7*  HCT 35.4*  PLT 78*   No results for input(s): NA, K, CL, CO2, GLUCOSE, BUN, CREATININE, CALCIUM in the last 72 hours.  Blood smear review: Pending  Pathology: None    Assessment and Plan: Tim Campbell is a very nice 84 year old white male.  He clearly has a significant issue.  I have to worry that there is a lot more going on than just possible metastatic prostate cancer.  With a PSA of 92, I have to believe that metastatic prostate cancer is part of the problem.  He has axillary adenopathy bilaterally.  I suspect that he probably has abdominal and pelvic adenopathy which is probably causing his leg edema.  He just is in poor shape and I do not think needs to go home.  I believe he needs to be hospitalized.  With a blood pressure of 87/46, this is not conducive for good living.  I think it would be very difficult  to have him keep going to different doctors for procedures.  He clearly needs a CT scan of the body.  He needs a biopsy of 1 of these lymph nodes in the axilla.  I cannot imagine that this would be recurrent lymphoma.  However, I am not sure what to have a lymphoma that he had.  We are trying to get all records of him that we have 13 years ago.  He probably needs to have a bone scan done.  Again if he has prostate cancer, then we can give him something to ablate his testosterone.  I would think this would be all that would be necessary for the prostate cancer.  I spent about an hour with he and his family.  All the time spent face-to-face.  I reviewed his MRI.  I went over some of his labs.  I explained why I felt he needed to be in the hospital.  We will find him in the hospital and help out as necessary.

## 2018-05-16 NOTE — H&P (Signed)
History and Physical    Tim Campbell SWH:[id number] DOB: Jan 13, 1926 DOA: 05/16/2018  PCP: [name], MD  Patient coming from: Home.  Chief Complaint: Abnormal MRI findings.  Weakness.  HPI: Tim Campbell is a 84 y.o. male with history of CAD status post CABG, status post TAVR, hypothyroidism, hyperlipidemia, history of lymphoma has been experiencing low back pain and had MRI done by patient's orthopedic surgeon which showed multiple metastatic lesions and patient was referred to his oncologist.  His oncologist on seeing patient generalized condition and complains of weakness referred patient to the ER.  Patient has been noticing increasing lower extremity edema over the last few weeks and had been following up with his cardiologist Dr. Daneen Schick.  Patient also has benign urinary retention for which patient was placed on Foley catheter.  ED Course: In the ER patient was found to have low normal blood pressure.  Troponin is elevated at 1.7 with EKG showing lateral T wave changes.  Since patient has features concerning for CHF Lasix 40 mg IV was given.  On-call cardiologist was consulted and patient admitted for further management of non-ST elevation MI and will need further management for metastatic spine lesions.  Review of Systems: As per HPI, rest all negative.   Past Medical History:  Diagnosis Date  . AAA (abdominal aortic aneurysm) ([place])    a. 3.3x3.0cm by CT 05/2016.  Marland Kitchen BPH (benign prostatic hyperplasia)   . Chronic combined systolic and diastolic HF (heart failure), NYHA class 3   . Coronary artery disease CARDIOLOGIST-  DR Angelena Form   a. s/p CABGx5 in 1993. b. NSTEMI with occluded VG-diag in 2005. c. BMS to prox VG-PDA 2012. d. BMS to SVG-OM 03/2014. e. stable cath 05/2016.  . Diverticulosis of colon   . Heart murmur   . History of colon polyps    2002;  2007  . History of non-ST elevation myocardial infarction (NSTEMI)    x2 --- 12-29-2003  and  01-01-2013  .  Hypertension   . Hypothyroidism   . Lower urinary tract symptoms (LUTS)   . Mixed hyperlipidemia   . Non Hodgkin's lymphoma ([place]) dx 2006 bone marrow bx--- onocologist-  dr Marin Olp   s/p  chemo and radiation therpay--- in remission since ---  . OA (osteoarthritis)   . Peripheral neuropathy   . Pulmonary nodules    a. by CT 05/2016  . RBBB (right bundle branch block)   . S/p bare metal coronary artery stent    10-16-2010  BMS to RCA graft  . S/P CABG x 5    05/ 1993  . 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 Urinary retention   . Wears dentures    upper  . Wears glasses     Past Surgical History:  Procedure Laterality Date  . CARDIAC CATHETERIZATION N/A 05/17/2016   Procedure: Right/Left Heart Cath and Coronary/Graft Angiography;  Surgeon: Nelva Bush, MD;  Location: [facility];  Service: Cardiovascular;  Laterality: N/A;  severe CAD,  total occlusion of mLAD, dLCx, and mRCA, 99% ostial LCX/ wide patent LIMA to LAD & SVG to OM, patent SVG to PDA/PL w/ stable 50% ISR at ostium SVG/ chronic occluded SVG to Diagonal/ severe AVS  . CARDIAC CATHETERIZATION  12-30-2003  dr Leonia Reeves   severe 3v cad:  occlused SVG to diagonal causing NSTEMI,  ef 45%  . CATARACT EXTRACTION W/ INTRAOCULAR LENS  IMPLANT, BILATERAL    . COLONOSCOPY    .  CORONARY ANGIOPLASTY WITH STENT PLACEMENT  10-20-2010  dr Daneen Schick   BMS to ostium of the RCA graft:  total occlusion LAD, CFX, and RCA,  bypass graft 's-- total occlusion SVG to diagonal,  high-grade obstruction prox.Alfredo Batty segment of RCA graft,  mid SVG to OM 50% otherwise patent/  mild AVS inferior wall hypokinesis  . CORONARY ARTERY BYPASS GRAFT  05/ 1993     dr gerhart   LIMA to LAD,  SVG to D1,  SVT to RCA,  SVG to OM,  SVG to PDA  . [place]  . I&D EXTREMITY Left 03/09/2016   Procedure: IRRIGATION AND DEBRIDEMENT WRIST;  Surgeon: Milly Jakob, MD;  Location: [facility];  Service: Orthopedics;  Laterality:  Left;  . INGUINAL HERNIA REPAIR Right 02/21/2006  . KNEE ARTHROSCOPY Right 1970'S   . LEFT AND RIGHT HEART CATHETERIZATION WITH CORONARY/GRAFT ANGIOGRAM N/A 04/05/2014   Procedure: LEFT AND RIGHT HEART CATHETERIZATION WITH [name];  Surgeon: Burnell Blanks, MD;  Location[facility];  Service: Cardiovascular;  Laterality: N/A;  . LEFT HEART CATHETERIZATION WITH CORONARY/GRAFT ANGIOGRAM N/A 01/02/2013   Procedure: LEFT HEART CATHETERIZATION WITH [name];  Surgeon: [name], MD;  Location: [facility] C[facility] LAB;  Service: Cardiovascular;  Laterality: N/A;NSTEMI:side-to-side anastomosis w/the PDA is threatened(not amenable to PCI)in-stent restenosis  RCA graft,  50% osyial SVG to OM , LIMA to LAD patent w/ diffuse distal LAD disease/  severe 3V CAD w/ occlusion LAD, CFX, RCA, ef 45%  . OPEN BX LEFT CERVICAL MASS, POSTERIOR NECK  10/03/2004  . OPEN REDUCTION INTERNAL FIXATION (ORIF) DISTAL RADIAL FRACTURE Left 02/24/2016   Procedure: OPEN TREATMENT OF LEFT DISTAL RADIUS FRACTURE;  Surgeon: Milly Jakob, MD;  Location: [facility];  Service: Orthopedics;  Laterality: Left;  . ORIF RIGHT ANKLE FX  10/07/2008  . PERCUTANEOUS CORONARY STENT INTERVENTION (PCI-S) N/A 04/21/2014   Procedure: PERCUTANEOUS CORONARY STENT INTERVENTION (PCI-S);  Surgeon: [name], MD;  Location: [facility] CATH LAB;  Service: Cardiovascular;  Laterality: N/A;   BMS in the ostial/proximal SVG to CFX,  stable 50-70% ostial SVG to RCA (previous stenting)  . TEE WITHOUT CARDIOVERSION N/A 06/26/2016   Procedure: TRANSESOPHAGEAL ECHOCARDIOGRAM (TEE);  Surgeon: Burnell Blanks, MD;  Location: [place];  Service: Open Heart Surgery;  Laterality: N/A;  . THULIUM LASER TURP (TRANSURETHRAL RESECTION OF PROSTATE) N/A 12/05/2016   Procedure: THULIUM LASER TURP (TRANSURETHRAL RESECTION OF PROSTATE);  Surgeon: Nickie Retort, MD;  Location: [facility];  Service: Urology;  Laterality: N/A;  . TRANSCATHETER  AORTIC VALVE REPLACEMENT, TRANSFEMORAL Bilateral 06/26/2016   Procedure: TRANSCATHETER AORTIC VALVE REPLACEMENT, TRANSFEMORAL;  Surgeon: Burnell Blanks, MD;  Location: [place] [facility];  Service: Open Heart Surgery;  Laterality: Bilateral;  . TRANSTHORACIC ECHOCARDIOGRAM  08/03/2016    post TAVR    ef 40-45%,  akinesis and scarring of the basal-inferolateral, inferior, and inferoseptal (consistent w/ infarction in distribution of the RCA), severe hypokinesis of the basal anteroseptal (consistent w/ ischemia in distribution of the LAD but w/ patent distal graft),  grade 2 diastolic dysfunction/  AV stent valve bioprosthesis normal function w/ trivial regurg (valve area 1.16cm^2)/  . TRANSTHORACIC ECHOCARDIOGRAM  08-03-2016  continued results- post TAVR   mild dilated ascending aorta/  mild to moderate MR, peak grandient 88mmHg/  severe LAE and mild RAE/  mild PR/  trivial TR     reports that he quit smoking about 56 years ago. His smoking use included cigarettes. He has  a 7.50 pack-year smoking history. He has never used smokeless tobacco. He reports that he does not drink alcohol or use drugs.  No Known Allergies  Family History  Problem Relation Age of Onset  . Colon cancer Father   . Stroke Mother   . CAD Brother   . Stroke Sister   . Bone cancer Brother   . Parkinson's disease Brother   . Dementia Sister   . Stroke Sister     Prior to Admission medications   Medication Sig Start Date End Date Taking? Authorizing Provider  acetaminophen (TYLENOL) 500 MG tablet Take 500 mg by mouth as needed.    [provider]  Ascorbic Acid (VITAMIN C) 1000 MG tablet Take 1,000 mg by mouth 3 (three) times a week.     [provider]  aspirin EC 81 MG tablet Take 81 mg by mouth daily.     [provider]  atorvastatin (LIPITOR) 40 MG tablet TAKE 1 TABLET (40 MG TOTAL) BY MOUTH DAILY AT 6 PM. PLEASE SCHEDULE ANNUAL APPOINTMENT THANKS. 04/11/18   Belva Crome, MD  B Complex-C  (B-COMPLEX WITH VITAMIN C) tablet Take 1 tablet by mouth 3 (three) times a week.     [provider]  clopidogrel (PLAVIX) 75 MG tablet TAKE 1 TABLET BY MOUTH EVERY DAY 02/21/18   Belva Crome, MD  folic acid (FOLVITE) 742 MCG tablet Take 800 mcg by mouth 3 (three) times a week.     [provider]  GARLIC PO Take by mouth.    [provider]  levothyroxine (SYNTHROID, LEVOTHROID) 125 MCG tablet Take 125 mcg by mouth daily before breakfast.  11/23/14   [provider]  Magnesium 200 MG TABS Take 200 mg by mouth daily.     [provider]  metoprolol tartrate (LOPRESSOR) 25 MG tablet Take 0.5 tablets (12.5 mg total) by mouth 2 (two) times daily. 09/18/17   Belva Crome, MD  Multiple Vitamins-Iron (CHLORELLA PO) Take 4 tablets by mouth 2 (two) times daily. MED NAME: CHLORELLA    [provider]  nitroGLYCERIN (NITROSTAT) 0.4 MG SL tablet Place 1 tablet (0.4 mg total) under the tongue every 5 (five) minutes as needed for chest pain. 01/22/17   Belva Crome, MD  RESVERATROL PO Take by mouth.    [provider]  SELENIUM PO Take 1 tablet by mouth 3 (three) times a week.     [provider]  tamsulosin (FLOMAX) 0.4 MG CAPS capsule Take 0.4 mg by mouth daily.  05/01/16   [provider]  tiZANidine (ZANAFLEX) 2 MG tablet Take 2 mg by mouth. 05/13/18   [provider]    Physical Exam: Vitals:   05/16/18 2015 05/16/18 2030 05/16/18 2115 05/16/18 2250  BP: (!) 92/53 (!) 93/51 (!) 94/45 (!) 112/59  Pulse: 80 77 84 93  Resp: 17 20 (!) 25 (!) 27  Temp:    100.1 F (37.8 C)  TempSrc:    Oral  SpO2: 93% 97% 95% 100%  Weight:      Height:          Constitutional: Moderately built and nourished. Vitals:   05/16/18 2015 05/16/18 2030 05/16/18 2115 05/16/18 2250  BP: (!) 92/53 (!) 93/51 (!) 94/45 (!) 112/59  Pulse: 80 77 84 93  Resp: 17 20 (!) 25 (!) 27  Temp:    100.1 F (37.8 C)  TempSrc:    Oral  SpO2:  93% 97% 95%  100%  Weight:      Height:       Eyes: Anicteric no pallor. ENMT: No discharge from the ears eyes nose or mouth. Neck: No mass palpated no neck rigidity no JVD appreciated. Respiratory: No rhonchi or crepitations. Cardiovascular: S1-S2 heard no murmurs appreciated. Abdomen: Soft nontender bowel sounds present. Musculoskeletal: Bilateral lower extremity edema extending up to the thigh. Skin: Chronic skin changes. Neurologic: Alert awake oriented to time place and person.  Moves all extremities. Psychiatric: Appears normal.  Normal affect.   Labs on Admission: I have personally reviewed following labs and imaging studies  CBC: Recent Labs  Lab 05/16/18 1226 05/16/18 1450  WBC 5.3 5.7  NEUTROABS 4.3 4.4  HGB 11.7* 12.0*  HCT 35.4* 35.8*  MCV 82.9 81.7  PLT 78* 75*   Basic Metabolic Panel: Recent Labs  Lab 05/16/18 1226 05/16/18 1450  NA 135 135  K 3.7 3.3*  CL 99 98  CO2 25 27  GLUCOSE 112* 132*  BUN 34* 36*  CREATININE 1.16 1.33*  CALCIUM 8.0* 7.9*   GFR: Estimated Creatinine Clearance: 43.5 mL/min (A) (by C-G formula based on SCr of 1.33 mg/dL (H)). Liver Function Tests: Recent Labs  Lab 05/16/18 1226 05/16/18 1450  AST 103* 101*  ALT 46* 44  ALKPHOS 87 69  BILITOT 1.0 1.0  PROT 5.6* 5.7*  ALBUMIN 2.8* 2.9*   No results for input(s): LIPASE, AMYLASE in the last 168 hours. No results for input(s): AMMONIA in the last 168 hours. Coagulation Profile: No results for input(s): INR, PROTIME in the last 168 hours. Cardiac Enzymes: Recent Labs  Lab 05/16/18 1814  TROPONINI 1.73*   BNP (last 3 results) No results for input(s): PROBNP in the last 8760 hours. HbA1C: No results for input(s): HGBA1C in the last 72 hours. CBG: No results for input(s): GLUCAP in the last 168 hours. Lipid Profile: No results for input(s): CHOL, HDL, LDLCALC, TRIG, CHOLHDL, LDLDIRECT in the last 72 hours. Thyroid Function Tests: No results for input(s): TSH,  T4TOTAL, FREET4, T3FREE, THYROIDAB in the last 72 hours. Anemia Panel: No results for input(s): VITAMINB12, FOLATE, FERRITIN, TIBC, IRON, RETICCTPCT in the last 72 hours. Urine analysis:    Component Value Date/Time   COLORURINE YELLOW 05/16/2018 1507   APPEARANCEUR CLOUDY (A) 05/16/2018 1507   LABSPEC 1.020 05/16/2018 1507   PHURINE 6.0 05/16/2018 1507   GLUCOSEU NEGATIVE 05/16/2018 1507   HGBUR LARGE (A) 05/16/2018 1507   BILIRUBINUR NEGATIVE 05/16/2018 1507   KETONESUR NEGATIVE 05/16/2018 1507   PROTEINUR 100 (A) 05/16/2018 1507   UROBILINOGEN 0.2 06/01/2015 0850   NITRITE NEGATIVE 05/16/2018 1507   LEUKOCYTESUR TRACE (A) 05/16/2018 1507   Sepsis Labs: @LABRCNTIP (procalcitonin:4,lacticidven:4) )No results found for this or any previous visit (from the past 240 hour(s)).   Radiological Exams on Admission: Dg Chest 2 View  Result Date: 05/16/2018 CLINICAL DATA:  Acute lower extremity swelling. No fever. Cough and congestion. EXAM: CHEST - 2 VIEW COMPARISON:  12/17/2016 FINDINGS: There changes from previous cardiac surgery and aortic valve replacement. Cardiac silhouette is mildly enlarged. No mediastinal or hilar masses. No evidence of adenopathy. Small pleural effusions. Prominent bronchovascular markings without overt pulmonary edema. No evidence of pneumonia. No pneumothorax. Skeletal structures are demineralized but grossly intact. IMPRESSION: 1. No acute cardiopulmonary disease. 2. Mild cardiomegaly with stable changes from prior cardiac surgery. 3. Small pleural effusions. Electronically Signed   By: Lajean Manes M.D.   On: 05/16/2018 17:35    EKG: Independently reviewed.  Normal  sinus rhythm with T wave changes in the lateral leads.  Assessment/Plan Principal Problem:   Non-ST elevated myocardial infarction [facility]) Active Problems:   Acute on chronic combined systolic and diastolic heart failure (HCC)   S/P TAVR (transcatheter aortic valve replacement)   Thrombocytopenia  (HCC)   Hypothyroidism   AAA (abdominal aortic aneurysm) (HCC)   Acute on chronic systolic CHF (congestive heart failure) ([place])   Acute lower UTI    1. Non-ST elevation MI discussed with on-call cardiologist Dr. Aldine Contes.  Since patient has significant thrombocytopenia no heparin has been started.  Patient is already on aspirin Plavix beta-blockers and statins.  Will trend cardiac markers.  Check 2D echo. 2. Bilateral lower extremity edema with history of cardiomyopathy last EF measured was present in October 2018.  1 dose of Lasix was given.  At this time patient symptoms are more likely from cachexia and hypoalbuminemia.  Will check Dopplers. 3. Anemia and thrombocytopenia with metastatic spinal lesions -will need input from patient's oncologist Dr. Burney Gauze.  Follow CBC. 4. Hypothyroidism on Synthroid. 5. History of TAVR. 6. UTI with indwelling catheter.  On ceftriaxone.   DVT prophylaxis: SCDs. Code Status: DO NOT INTUBATE. Family Communication: Patient's wife and daughters. Disposition Plan: To be determined. Consults called: Cardiology. Admission status: Inpatient.   Rise Patience MD [facility] Pager [phone].  If 7PM-7AM, please contact night-coverage [email or web address] Password [email or web address]  05/16/2018, 11:45 PM

## 2018-05-16 NOTE — Telephone Encounter (Signed)
Dr. Marin Olp office called and requested records from [facility], faxed requested records to [phone]

## 2018-05-16 NOTE — ED Provider Notes (Addendum)
[facility] EMERGENCY DEPARTMENT Provider Note  CSN: [id number] Arrival date & time: 05/16/18  1423  History   Chief Complaint Chief Complaint  Patient presents with  . Hypotension   HPI Tim Campbell is a 84 y.o. male with a complex medical history including systolic and diastolic HF, AAA, CAD s/p CABG, aortic stenosis s/p aortic valve replacement, STEMI, HTN, CKD and lymphoma who presented to the ED via oncologist for hypotension. The patient lives with his wife and adult daughter who assist with his ADLs. The daughter states that patient's physical and mental state has declined over the last week. Endorses diarrhea, sleeping most of the day and decreased appetite. Patient is poor historian and not able to contribute significantly to this. However, he states that he is not normal.  Additional history obtained by medical chart. At his outpatient visit today, his BP was 87/46. Dr. Marin Olp has concerns about metastatic prostate cancer and feels that patient should be hospitalized.  Past Medical History:  Diagnosis Date  . AAA (abdominal aortic aneurysm) ([place])    a. 3.3x3.0cm by CT 05/2016.  Marland Kitchen BPH (benign prostatic hyperplasia)   . Chronic combined systolic and diastolic HF (heart failure), NYHA class 3   . Coronary artery disease CARDIOLOGIST-  DR Angelena Form   a. s/p CABGx5 in 1993. b. NSTEMI with occluded VG-diag in 2005. c. BMS to prox VG-PDA 2012. d. BMS to SVG-OM 03/2014. e. stable cath 05/2016.  . Diverticulosis of colon   . Heart murmur   . History of colon polyps    2002;  2007  . History of non-ST elevation myocardial infarction (NSTEMI)    x2 --- 12-29-2003  and  01-01-2013  . Hypertension   . Hypothyroidism   . Lower urinary tract symptoms (LUTS)   . Mixed hyperlipidemia   . Non Hodgkin's lymphoma ([place]) dx 2006 bone marrow bx--- onocologist-  dr Marin Olp   s/p  chemo and radiation therpay--- in remission since ---  . OA (osteoarthritis)   . Peripheral neuropathy     . Pulmonary nodules    a. by CT 05/2016  . RBBB (right bundle branch block)   . S/p bare metal coronary artery stent    10-16-2010  BMS to RCA graft  . S/P CABG x 5    05/ 1993  . 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 Urinary retention   . Wears dentures    upper  . Wears glasses     Patient Active Problem List   Diagnosis Date Noted  . Acute urinary retention 12/17/2016  . Hematuria, gross- s/p TURP ~ 12 days prior to admission. 12/17/2016  . Renal failure, acute on chronic (HCC) 12/17/2016  . Gross hematuria   . Urinary retention   . Acute renal failure superimposed on stage 3 chronic kidney disease ([place])   . Pulmonary nodules 07/07/2016  . S/P TAVR (transcatheter aortic valve replacement) 06/28/2016  . Thrombocytopenia ([place]) 06/28/2016  . Hypothyroidism 06/28/2016  . RBBB 06/28/2016  . AAA (abdominal aortic aneurysm) ([place])   . Severe aortic valve stenosis 06/26/2016  . Postoperative wound infection 03/11/2016  . Preoperative cardiovascular examination 02/20/2016  . Hyperlipidemia 06/17/2014  . Chronic combined systolic and diastolic HF (heart failure), NYHA class 3 ([place]) 09/09/2013  . Coronary artery disease involving native coronary artery of native heart with angina pectoris ([place]) 01/03/2013  . HTN (hypertension) 01/03/2013  . Non-ST elevation MI (NSTEMI) ([place]) 01/02/2013    Class: Acute    Past  Surgical History:  Procedure Laterality Date  . CARDIAC CATHETERIZATION N/A 05/17/2016   Procedure: Right/Left Heart Cath and Coronary/Graft Angiography;  Surgeon: Nelva Bush, MD;  Location: [facility] LAB;  Service: Cardiovascular;  Laterality: N/A;  severe CAD,  total occlusion of mLAD, dLCx, and mRCA, 99% ostial LCX/ wide patent LIMA to LAD & SVG to OM, patent SVG to PDA/PL w/ stable 50% ISR at ostium SVG/ chronic occluded SVG to Diagonal/ severe AVS  . CARDIAC CATHETERIZATION  12-30-2003  dr Leonia Reeves   severe 3v cad:  occlused SVG to  diagonal causing NSTEMI,  ef 45%  . CATARACT EXTRACTION W/ INTRAOCULAR LENS  IMPLANT, BILATERAL    . COLONOSCOPY    . CORONARY ANGIOPLASTY WITH STENT PLACEMENT  10-20-2010  dr Daneen Schick   BMS to ostium of the RCA graft:  total occlusion LAD, CFX, and RCA,  bypass graft 's-- total occlusion SVG to diagonal,  high-grade obstruction prox.Alfredo Batty segment of RCA graft,  mid SVG to OM 50% otherwise patent/  mild AVS inferior wall hypokinesis  . CORONARY ARTERY BYPASS GRAFT  05/ 1993     dr gerhart   LIMA to LAD,  SVG to D1,  SVT to RCA,  SVG to OM,  SVG to PDA  . [place]  . I&D EXTREMITY Left 03/09/2016   Procedure: IRRIGATION AND DEBRIDEMENT WRIST;  Surgeon: Milly Jakob, MD;  Location: [facility];  Service: Orthopedics;  Laterality: Left;  . INGUINAL HERNIA REPAIR Right 02/21/2006  . KNEE ARTHROSCOPY Right 1970'S   . LEFT AND RIGHT HEART CATHETERIZATION WITH CORONARY/GRAFT ANGIOGRAM N/A 04/05/2014   Procedure: LEFT AND RIGHT HEART CATHETERIZATION WITH [name];  Surgeon: Burnell Blanks, MD;  [facility] LAB;  Service: Cardiovascular;  Laterality: N/A;  . LEFT HEART CATHETERIZATION WITH CORONARY/GRAFT ANGIOGRAM N/A 01/02/2013   Procedure: LEFT HEART CATHETERIZATION WITH [name];  Surgeon: [name], MD;  Location: [facility] CATH LAB;  Service: Cardiovascular;  Laterality: N/A;NSTEMI:side-to-side anastomosis w/the PDA is threatened(not amenable to PCI)in-stent restenosis  RCA graft,  50% osyial SVG to OM , LIMA to LAD patent w/ diffuse distal LAD disease/  severe 3V CAD w/ occlusion LAD, CFX, RCA, ef 45%  . OPEN BX LEFT CERVICAL MASS, POSTERIOR NECK  10/03/2004  . OPEN REDUCTION INTERNAL FIXATION (ORIF) DISTAL RADIAL FRACTURE Left 02/24/2016   Procedure: OPEN TREATMENT OF LEFT DISTAL RADIUS FRACTURE;  Surgeon: Milly Jakob, MD;  Location: [facility];  Service: Orthopedics;  Laterality: Left;  . ORIF RIGHT ANKLE FX  10/07/2008  . PERCUTANEOUS  CORONARY STENT INTERVENTION (PCI-S) N/A 04/21/2014   Procedure: PERCUTANEOUS CORONARY STENT INTERVENTION (PCI-S);  Surgeon: [name], MD;  Location: [facility] CATH LAB;  Service: Cardiovascular;  Laterality: N/A;   BMS in the ostial/proximal SVG to CFX,  stable 50-70% ostial SVG to RCA (previous stenting)  . TEE WITHOUT CARDIOVERSION N/A 06/26/2016   Procedure: TRANSESOPHAGEAL ECHOCARDIOGRAM (TEE);  Surgeon: Burnell Blanks, MD;  Location: [facility];  Service: Open Heart Surgery;  Laterality: N/A;  . THULIUM LASER TURP (TRANSURETHRAL RESECTION OF PROSTATE) N/A 12/05/2016   Procedure: THULIUM LASER TURP (TRANSURETHRAL RESECTION OF PROSTATE);  Surgeon: Nickie Retort, MD;  Location: [facility];  Service: Urology;  Laterality: N/A;  . TRANSCATHETER AORTIC VALVE REPLACEMENT, TRANSFEMORAL Bilateral 06/26/2016   Procedure: TRANSCATHETER AORTIC VALVE REPLACEMENT, TRANSFEMORAL;  Surgeon: Burnell Blanks, MD;  Location: [facility];  Service: Open Heart Surgery;  Laterality: Bilateral;  . TRANSTHORACIC ECHOCARDIOGRAM  08/03/2016  post TAVR    ef 40-45%,  akinesis and scarring of the basal-inferolateral, inferior, and inferoseptal (consistent w/ infarction in distribution of the RCA), severe hypokinesis of the basal anteroseptal (consistent w/ ischemia in distribution of the LAD but w/ patent distal graft),  grade 2 diastolic dysfunction/  AV stent valve bioprosthesis normal function w/ trivial regurg (valve area 1.16cm^2)/  . TRANSTHORACIC ECHOCARDIOGRAM  08-03-2016  continued results- post TAVR   mild dilated ascending aorta/  mild to moderate MR, peak grandient 66mmHg/  severe LAE and mild RAE/  mild PR/  trivial TR        Home Medications    Prior to Admission medications   Medication Sig Start Date End Date Taking? Authorizing Provider  acetaminophen (TYLENOL) 500 MG tablet Take 500 mg by mouth as needed.    [provider]  Ascorbic Acid (VITAMIN C) 1000 MG tablet Take 1,000 mg by mouth  3 (three) times a week.     [provider]  aspirin EC 81 MG tablet Take 81 mg by mouth daily.     [provider]  atorvastatin (LIPITOR) 40 MG tablet TAKE 1 TABLET (40 MG TOTAL) BY MOUTH DAILY AT 6 PM. PLEASE SCHEDULE ANNUAL APPOINTMENT THANKS. 04/11/18   Belva Crome, MD  B Complex-C (B-COMPLEX WITH VITAMIN C) tablet Take 1 tablet by mouth 3 (three) times a week.     [provider]  clopidogrel (PLAVIX) 75 MG tablet TAKE 1 TABLET BY MOUTH EVERY DAY 02/21/18   Belva Crome, MD  folic acid (FOLVITE) 502 MCG tablet Take 800 mcg by mouth 3 (three) times a week.     [provider]  GARLIC PO Take by mouth.    [provider]  levothyroxine (SYNTHROID, LEVOTHROID) 125 MCG tablet Take 125 mcg by mouth daily before breakfast.  11/23/14   [provider]  Magnesium 200 MG TABS Take 200 mg by mouth daily.     [provider]  metoprolol tartrate (LOPRESSOR) 25 MG tablet Take 0.5 tablets (12.5 mg total) by mouth 2 (two) times daily. 09/18/17   Belva Crome, MD  Multiple Vitamins-Iron (CHLORELLA PO) Take 4 tablets by mouth 2 (two) times daily. MED NAME: CHLORELLA    [provider]  nitroGLYCERIN (NITROSTAT) 0.4 MG SL tablet Place 1 tablet (0.4 mg total) under the tongue every 5 (five) minutes as needed for chest pain. 01/22/17   Belva Crome, MD  RESVERATROL PO Take by mouth.    [provider]  SELENIUM PO Take 1 tablet by mouth 3 (three) times a week.     [provider]  tamsulosin (FLOMAX) 0.4 MG CAPS capsule Take 0.4 mg by mouth daily.  05/01/16   [provider]  tiZANidine (ZANAFLEX) 2 MG tablet Take 2 mg by mouth. 05/13/18   [provider]    Family History Family History  Problem Relation Age of Onset  . Colon cancer Father   . Stroke Mother   . CAD Brother   . Stroke Sister   . Bone cancer Brother   . Parkinson's disease Brother   . Dementia Sister   . Stroke Sister      Social History Social History   Tobacco Use  . Smoking status: Former Smoker    Packs/day: 0.50    Years: 15.00    Pack years: 7.50    Types: Cigarettes    Last attempt to quit: 06/22/1961    Years since quitting:  56.9  . Smokeless tobacco: Never Used  Substance Use Topics  . Alcohol use: No  . Drug use: No     Allergies   Patient has no known allergies.   Review of Systems Review of Systems  Unable to perform ROS: Dementia  Constitutional: Positive for activity change, appetite change and fatigue.  Gastrointestinal: Positive for diarrhea.  Psychiatric/Behavioral: Positive for sleep disturbance.   Pertinent positives relayed by family.  Physical Exam Updated Vital Signs BP (!) 94/55   Pulse 84   Temp 99.1 F (37.3 C) (Oral)   Resp (!) 27   Ht 5\' 10"  (1.778 m)   Wt 94.8 kg   SpO2 98%   BMI 29.99 kg/m   Physical Exam  Constitutional: He is easily aroused.  Chronic ill appearance. Patient sleeps for most of the interview, but can be easily aroused.  Eyes: Pupils are equal, round, and reactive to light. Conjunctivae, EOM and lids are normal.  Neck: Trachea normal, normal range of motion, full passive range of motion without pain and phonation normal. Neck supple.  Cardiovascular: Normal rate, regular rhythm and intact distal pulses.  Murmur heard. Pulmonary/Chest: Effort normal and breath sounds normal.  Abdominal: Soft. Bowel sounds are normal. He exhibits distension. There is no tenderness.  Genitourinary: Rectal exam shows anal tone normal. Prostate is enlarged.  Genitourinary Comments: Patient has catheter. Non-erythematous. Urine contents dark and cloudy. Posterior prostate enlarged and hardened mass palpated.  Musculoskeletal:  Bilateral leg 1+ edema to shins.  Neurological: He is easily aroused.  Skin: Skin is warm and intact. He is not diaphoretic. No pallor.  Psychiatric: Cognition and memory are impaired. He exhibits abnormal recent memory.   Nursing note and vitals reviewed.  ED Treatments / Results  Labs (all labs ordered are listed, but only abnormal results are displayed) Labs Reviewed  CBC WITH DIFFERENTIAL/PLATELET - Abnormal; Notable for the following components:      Result Value   Hemoglobin 12.0 (*)    HCT 35.8 (*)    RDW 15.7 (*)    Platelets 75 (*)    All other components within normal limits  COMPREHENSIVE METABOLIC PANEL - Abnormal; Notable for the following components:   Potassium 3.3 (*)    Glucose, Bld 132 (*)    BUN 36 (*)    Creatinine, Ser 1.33 (*)    Calcium 7.9 (*)    Total Protein 5.7 (*)    Albumin 2.9 (*)    AST 101 (*)    GFR calc non Af Amer 46 (*)    GFR calc Af Amer 53 (*)    All other components within normal limits  URINALYSIS, ROUTINE W REFLEX MICROSCOPIC - Abnormal; Notable for the following components:   APPearance CLOUDY (*)    Hgb urine dipstick LARGE (*)    Protein, ur 100 (*)    Leukocytes, UA TRACE (*)    All other components within normal limits  URINALYSIS, MICROSCOPIC (REFLEX) - Abnormal; Notable for the following components:   Bacteria, UA MANY (*)    All other components within normal limits  BRAIN NATRIURETIC PEPTIDE - Abnormal; Notable for the following components:   B Natriuretic Peptide 855.2 (*)    All other components within normal limits  TROPONIN I - Abnormal; Notable for the following components:   Troponin I 1.73 (*)    All other components within normal limits  URINE CULTURE  OCCULT BLOOD X 1 CARD TO LAB, STOOL  I-STAT CG4 LACTIC ACID, ED  EKG None  Radiology Dg Chest 2 View  Result Date: 05/16/2018 CLINICAL DATA:  Acute lower extremity swelling. No fever. Cough and congestion. EXAM: CHEST - 2 VIEW COMPARISON:  12/17/2016 FINDINGS: There changes from previous cardiac surgery and aortic valve replacement. Cardiac silhouette is mildly enlarged. No mediastinal or hilar masses. No evidence of adenopathy. Small pleural effusions. Prominent  bronchovascular markings without overt pulmonary edema. No evidence of pneumonia. No pneumothorax. Skeletal structures are demineralized but grossly intact. IMPRESSION: 1. No acute cardiopulmonary disease. 2. Mild cardiomegaly with stable changes from prior cardiac surgery. 3. Small pleural effusions. Electronically Signed   By: Lajean Manes M.D.   On: 05/16/2018 17:35    Procedures Procedures (including critical care time)  Medications Ordered in ED Medications  furosemide (LASIX) injection 40 mg (40 mg Intravenous Given 05/16/18 1754)  ondansetron (ZOFRAN) injection 4 mg ( Intravenous Canceled Entry 05/16/18 1916)    Initial Impression / Assessment and Plan / ED Course  Triage vital signs and the nursing notes have been reviewed.  Pertinent labs & imaging results that were available during care of the patient were reviewed and considered in medical decision making (see chart for details).  Patient presents via his primary care with hypotension. BP was 87/46, but increased to 99/43 in the ED without any intervention. He is afebrile and not in acute distress. Patient denies symptoms of hypotension or causes of including diaphoresis, dizziness/lightheadedness, chest pain, SOB and palpitations. However, family expresses additional concerns as patient has physically and mentally declined over the last week.   Clinical Course as of May 16 2013  Fri May 16, 2018  1509 Case discussed with Dr. Blanchie Dessert.  Last labs done in 12/2016. Patient has worsening kidney function with creatinine elevated at 1.33 from 0.96. Thrombocytopenia is also worsened with last platelet count being 110 and is now 75 today. UA shows UTI, but has indwelling cath. Will send urine for culture to evaluate.   [GM]  1730 BNP elevated at 855.2. CXR with cardiomegaly, small pleural effusions and prominent vascular markings suggestive of heart failure exacerbation. Last echo done in 07/2017 shows LV hypertrophy and EF of  40-45%. Patient currently not taking diuretic. Will give IV Lasix 40mg  x1 to assist with fluid overload today.   [GM]  1757 EKG showed new RBBB. Also see T wave inversions. Pending troponin.   [GM]  1912 Elevated troponin at 1.73. Will consult cardiology for input given new EKG changes and complaints    [GM]  1942 Case discussed with Dr. Harrington Challenger with cardiology. Decision to defer heparin administration until patient is admitted due to his thrombocytopenia. Cardiology will follow patient during admission. Consult placed to [facility] for admission.   [GM]  2010 Case discussed with [facility] who will admit the patient.   [GM]    Clinical Course User Index [GM] Mina Carlisi, Jonelle Sports, PA-C    Final Clinical Impressions(s) / ED Diagnoses   Dispo: Admit. Case discussed with [facility] who will admit patient.  Final diagnoses:  Acute on chronic combined systolic and diastolic heart failure [facility])    ED Discharge Orders    None        Junita Push 05/16/18 [address] I, PA-C 05/16/18 2014    [address] I, PA-C 05/16/18 2015    Kaci Dillie, [place] I, PA-C 05/16/18 2306    Blanchie Dessert, MD 05/17/18 0045

## 2018-05-16 NOTE — ED Notes (Signed)
Pt c/o nausea. RN made aware.

## 2018-05-16 NOTE — ED Triage Notes (Signed)
He was seen by Dr Marin Olp today and while there his BP was low. New diagnosis of cancer.

## 2018-05-16 NOTE — ED Notes (Signed)
ED Provider at bedside. 

## 2018-05-17 ENCOUNTER — Inpatient Hospital Stay (HOSPITAL_COMMUNITY): Payer: Medicare Other

## 2018-05-17 DIAGNOSIS — R609 Edema, unspecified: Secondary | ICD-10-CM

## 2018-05-17 DIAGNOSIS — I214 Non-ST elevation (NSTEMI) myocardial infarction: Secondary | ICD-10-CM

## 2018-05-17 DIAGNOSIS — R600 Localized edema: Secondary | ICD-10-CM | POA: Diagnosis present

## 2018-05-17 DIAGNOSIS — I361 Nonrheumatic tricuspid (valve) insufficiency: Secondary | ICD-10-CM

## 2018-05-17 DIAGNOSIS — R748 Abnormal levels of other serum enzymes: Secondary | ICD-10-CM

## 2018-05-17 LAB — ECHOCARDIOGRAM COMPLETE
HEIGHTINCHES: 70 in
WEIGHTICAEL: 3195.2 [oz_av]

## 2018-05-17 LAB — HEMOGLOBIN A1C
HEMOGLOBIN A1C: 5.7 % — AB (ref 4.8–5.6)
MEAN PLASMA GLUCOSE: 116.89 mg/dL

## 2018-05-17 LAB — CBC WITH DIFFERENTIAL/PLATELET
Abs Immature Granulocytes: 0 10*3/uL (ref 0.0–0.1)
Basophils Absolute: 0 10*3/uL (ref 0.0–0.1)
Basophils Relative: 0 %
EOS ABS: 0 10*3/uL (ref 0.0–0.7)
EOS PCT: 0 %
HEMATOCRIT: 34.1 % — AB (ref 39.0–52.0)
HEMOGLOBIN: 11.1 g/dL — AB (ref 13.0–17.0)
Immature Granulocytes: 1 %
LYMPHS ABS: 0.7 10*3/uL (ref 0.7–4.0)
LYMPHS PCT: 12 %
MCH: 27.1 pg (ref 26.0–34.0)
MCHC: 32.6 g/dL (ref 30.0–36.0)
MCV: 83.4 fL (ref 78.0–100.0)
MONO ABS: 0.5 10*3/uL (ref 0.1–1.0)
MONOS PCT: 9 %
Neutro Abs: 4.2 10*3/uL (ref 1.7–7.7)
Neutrophils Relative %: 78 %
Platelets: 56 10*3/uL — ABNORMAL LOW (ref 150–400)
RBC: 4.09 MIL/uL — AB (ref 4.22–5.81)
RDW: 15.5 % (ref 11.5–15.5)
WBC: 5.4 10*3/uL (ref 4.0–10.5)

## 2018-05-17 LAB — BASIC METABOLIC PANEL
Anion gap: 10 (ref 5–15)
BUN: 30 mg/dL — ABNORMAL HIGH (ref 8–23)
CO2: 29 mmol/L (ref 22–32)
Calcium: 7.8 mg/dL — ABNORMAL LOW (ref 8.9–10.3)
Chloride: 99 mmol/L (ref 98–111)
Creatinine, Ser: 1.38 mg/dL — ABNORMAL HIGH (ref 0.61–1.24)
GFR calc non Af Amer: 44 mL/min — ABNORMAL LOW (ref >60)
GFR, EST AFRICAN AMERICAN: 51 mL/min — AB (ref >60)
Glucose, Bld: 112 mg/dL — ABNORMAL HIGH (ref 70–99)
POTASSIUM: 3 mmol/L — AB (ref 3.5–5.1)
SODIUM: 138 mmol/L (ref 135–145)

## 2018-05-17 LAB — MRSA PCR SCREENING: MRSA BY PCR: NEGATIVE

## 2018-05-17 LAB — BETA 2 MICROGLOBULIN, SERUM: Beta-2 Microglobulin: 5.9 mg/L — ABNORMAL HIGH (ref 0.6–2.4)

## 2018-05-17 LAB — HEPATIC FUNCTION PANEL
ALBUMIN: 2.4 g/dL — AB (ref 3.5–5.0)
ALT: 40 U/L (ref 0–44)
AST: 82 U/L — AB (ref 15–41)
Alkaline Phosphatase: 65 U/L (ref 38–126)
BILIRUBIN TOTAL: 1 mg/dL (ref 0.3–1.2)
Bilirubin, Direct: 0.4 mg/dL — ABNORMAL HIGH (ref 0.0–0.2)
Indirect Bilirubin: 0.6 mg/dL (ref 0.3–0.9)
TOTAL PROTEIN: 4.7 g/dL — AB (ref 6.5–8.1)

## 2018-05-17 LAB — IGG, IGA, IGM
IgA: 166 mg/dL (ref 61–437)
IgG (Immunoglobin G), Serum: 761 mg/dL (ref 700–1600)
IgM (Immunoglobulin M), Srm: 44 mg/dL (ref 15–143)

## 2018-05-17 LAB — MAGNESIUM: MAGNESIUM: 1.5 mg/dL — AB (ref 1.7–2.4)

## 2018-05-17 LAB — TROPONIN I
Troponin I: 1.57 ng/mL (ref <0.03)
Troponin I: 1.33 ng/mL (ref <0.03)
Troponin I: 1.3 ng/mL (ref <0.03)

## 2018-05-17 LAB — TSH: TSH: 2.02 u[IU]/mL (ref 0.350–4.500)

## 2018-05-17 MED ORDER — OXYCODONE HCL 5 MG PO TABS
5.0000 mg | ORAL_TABLET | ORAL | Status: DC | PRN
  Filled 2018-05-17: qty 1

## 2018-05-17 MED ORDER — MAGNESIUM SULFATE 2 GM/50ML IV SOLN
2.0000 g | Freq: Once | INTRAVENOUS | Status: AC
  Administered 2018-05-17: 2 g via INTRAVENOUS
  Filled 2018-05-17: qty 50

## 2018-05-17 MED ORDER — POTASSIUM CHLORIDE CRYS ER 20 MEQ PO TBCR
40.0000 meq | EXTENDED_RELEASE_TABLET | ORAL | Status: AC
  Administered 2018-05-17 (×2): 40 meq via ORAL
  Filled 2018-05-17 (×2): qty 2

## 2018-05-17 NOTE — Progress Notes (Signed)
Manual BP obtained per MD order. BP was 98/58 on L arm. Will continue to monitor.

## 2018-05-17 NOTE — Progress Notes (Signed)
  Echocardiogram 2D Echocardiogram has been performed.  Tim Campbell 05/17/2018, 11:59 AM

## 2018-05-17 NOTE — Progress Notes (Signed)
VASCULAR LAB PRELIMINARY  PRELIMINARY  PRELIMINARY  PRELIMINARY  Bilateral lower extremity venous duplex completed.    Preliminary report:  There is no DVT noted throughout the bilateral lower extremities.  There is superficial thrombosis noted in the right popliteal fossa.  There is significant interstitial fluid noted throughout the bilateral lower extremities.  Waveforms are pulsatile, suggestive of fluid overload.   Eliya Geiman, RVT 05/17/2018, 1:22 PM

## 2018-05-17 NOTE — Progress Notes (Addendum)
PROGRESS NOTE    Tim Campbell Monica  OMV:[id number] DOB: March 19, 1926 DOA: 05/16/2018 PCP: [name], MD    Brief Narrative:  Patient is 84 year old gentleman history of coronary artery disease status post CABG, status post TAVR, hypothyroidism, hyperlipidemia, history of lymphoma variant been experiencing low back pain had MRI done which showed multiple metastatic lesions patient was subsequently referred to his oncologist by [facility] orthopedic surgeon.  Patient was seen by oncologist due to patient's complaints of generalized weakness he was referred to the ED.  Patient noted to have lower extremity edema.  Patient noted in the ED to have borderline blood pressure with a elevated troponin and EKG showing lateral T wave changes.  Lower extremity edema was concerning to EDP for volume overload and patient received a dose of IV Lasix.  Cardiology was consulted and patient admitted for non-ST elevated MI.   Assessment & Plan:   Principal Problem:   Non-ST elevated myocardial infarction [facility]) Active Problems:   Acute on chronic combined systolic and diastolic heart failure (HCC)   S/P TAVR (transcatheter aortic valve replacement)   Thrombocytopenia (HCC)   Hypothyroidism   AAA (abdominal aortic aneurysm) (HCC)   Acute on chronic systolic CHF (congestive heart failure) (HCC)   Acute lower UTI  1 non-ST elevated MI Patient presenting with generalized weakness and back pain with MRI findings in the outpatient setting for metastatic disease.  Patient denies any acute chest pain or shortness of breath on evaluation.  Troponins elevated at 1.73, 1.57, 1.33, 1.30.  2D echo pending.  Due to thrombocytopenia patient not placed on IV heparin.  Continue aspirin, Plavix, Lopressor.  Cardiology consulted and are following.  2. BLE edema.??  Acute on chronic CHF exacerbation. Patient with history of cardiomyopathy.  Patient with borderline blood pressure.  Patient given a dose of IV Lasix.  Lower extremity  edema may be secondary to cachexia and hypoalbuminemia.  Lower extremity Dopplers ordered and negative for DVT.  2D echo pending.  BNP elevated at 855.2.  Unable to place on IV Lasix due to borderline blood pressure.  Cardiology following.  3.  Hypothyroidism TSH at 2.020.  Continue home dose Synthroid.  4.  AAA Outpatient follow-up.  5.  Probable UTI Urine cultures pending.  Continue empiric IV Rocephin.  6.  Metastatic spinal lesions/anemia/thrombocytopenia Patient's oncologist has been informed via epic of patient's admission and patient was sent to the ED by his oncologist.  Will defer further work-up of metastatic spinal lesions to oncology.  Patient with no overt bleeding.  Follow.  7.  Generalized weakness Likely secondary to problem #6.  PT/OT.  Follow.  8.  Hx of TAVR Per cardiology  9.  Hypokalemia/hypomagnesemia Likely secondary to diuresis from admission.  Replete.  Follow.   DVT prophylaxis: SCDs Code Status: Partial Family Communication: Updated patient and daughter at bedside. Disposition Plan: To be determined.   Consultants:   Cardiology: Dr. Aldine Contes 05/17/2018  Procedures:  Lower extremity Dopplers pending.  05/17/2018  2D echo pending.  05/17/2018  Antimicrobials:   IV Rocephin 05/17/2018   Subjective: Patient sitting up in bed eating a muffin.  Denies any chest pain.  Denies any shortness of breath.  Still with generalized weakness.  Objective: Vitals:   05/17/18 0428 05/17/18 0806 05/17/18 0850 05/17/18 1106  BP: (!) 99/50 (!) 101/52  (!) 94/54  Pulse: 87 89 91 82  Resp: (!) 26 (!) 30  (!) 28  Temp: 98.3 F (36.8 C) 99.2 F (37.3 C)  99 F (  37.2 C)  TempSrc: Axillary Oral  Oral  SpO2: 99% 96%  94%  Weight: 90.6 kg     Height:        Intake/Output Summary (Last 24 hours) at 05/17/2018 1120 Last data filed at 05/17/2018 1120 Gross per 24 hour  Intake 150 ml  Output 2300 ml  Net -2150 ml   Filed Weights   05/16/18 1425 05/17/18 0428    Weight: 94.8 kg 90.6 kg    Examination:  General exam: Appears calm and comfortable  Respiratory system: Clear to auscultation. Respiratory effort normal. Cardiovascular system: S1 & S2 heard, RRR. No JVD, murmurs, rubs, gallops or clicks.  2+ bilateral lower extremity edema.   Gastrointestinal system: Abdomen is nondistended, soft and nontender. No organomegaly or masses felt. Normal bowel sounds heard. Central nervous system: Alert and oriented. No focal neurological deficits. Extremities: Symmetric 5 x 5 power. Skin: No rashes, lesions or ulcers Psychiatry: Judgement and insight appear normal. Mood & affect appropriate.     Data Reviewed: I have personally reviewed following labs and imaging studies  CBC: Recent Labs  Lab 05/16/18 1226 05/16/18 1450 05/17/18 0601  WBC 5.3 5.7 5.4  NEUTROABS 4.3 4.4 4.2  HGB 11.7* 12.0* 11.1*  HCT 35.4* 35.8* 34.1*  MCV 82.9 81.7 83.4  PLT 78* 75* 56*   Basic Metabolic Panel: Recent Labs  Lab 05/16/18 1226 05/16/18 1450 05/17/18 0006 05/17/18 0601  NA 135 135  --  138  K 3.7 3.3*  --  3.0*  CL 99 98  --  99  CO2 25 27  --  29  GLUCOSE 112* 132*  --  112*  BUN 34* 36*  --  30*  CREATININE 1.16 1.33*  --  1.38*  CALCIUM 8.0* 7.9*  --  7.8*  MG  --   --  1.5*  --    GFR: Estimated Creatinine Clearance: 41.1 mL/min (A) (by C-G formula based on SCr of 1.38 mg/dL (H)). Liver Function Tests: Recent Labs  Lab 05/16/18 1226 05/16/18 1450 05/17/18 0601  AST 103* 101* 82*  ALT 46* 44 40  ALKPHOS 87 69 65  BILITOT 1.0 1.0 1.0  PROT 5.6* 5.7* 4.7*  ALBUMIN 2.8* 2.9* 2.4*   No results for input(s): LIPASE, AMYLASE in the last 168 hours. No results for input(s): AMMONIA in the last 168 hours. Coagulation Profile: No results for input(s): INR, PROTIME in the last 168 hours. Cardiac Enzymes: Recent Labs  Lab 05/16/18 1814 05/17/18 0006 05/17/18 0601  TROPONINI 1.73* 1.57* 1.33*   BNP (last 3 results) No results for  input(s): PROBNP in the last 8760 hours. HbA1C: Recent Labs    05/17/18 0601  HGBA1C 5.7*   CBG: No results for input(s): GLUCAP in the last 168 hours. Lipid Profile: No results for input(s): CHOL, HDL, LDLCALC, TRIG, CHOLHDL, LDLDIRECT in the last 72 hours. Thyroid Function Tests: Recent Labs    05/17/18 0006  TSH 2.020   Anemia Panel: No results for input(s): VITAMINB12, FOLATE, FERRITIN, TIBC, IRON, RETICCTPCT in the last 72 hours. Sepsis Labs: Recent Labs  Lab 05/16/18 1555  LATICACIDVEN 1.50    Recent Results (from the past 240 hour(s))  MRSA PCR Screening     Status: None   Collection Time: 05/17/18  3:15 AM  Result Value Ref Range Status   MRSA by PCR NEGATIVE NEGATIVE Final    Comment:        The GeneXpert MRSA Assay (FDA approved for NASAL specimens only), is one  component of a comprehensive MRSA colonization surveillance program. It is not intended to diagnose MRSA infection nor to guide or monitor treatment for MRSA infections. Performed at [facility], [place] [address], [place], [place] [address]          Radiology Studies: Dg Chest 2 View  Result Date: 05/16/2018 CLINICAL DATA:  Acute lower extremity swelling. No fever. Cough and congestion. EXAM: CHEST - 2 VIEW COMPARISON:  12/17/2016 FINDINGS: There changes from previous cardiac surgery and aortic valve replacement. Cardiac silhouette is mildly enlarged. No mediastinal or hilar masses. No evidence of adenopathy. Small pleural effusions. Prominent bronchovascular markings without overt pulmonary edema. No evidence of pneumonia. No pneumothorax. Skeletal structures are demineralized but grossly intact. IMPRESSION: 1. No acute cardiopulmonary disease. 2. Mild cardiomegaly with stable changes from prior cardiac surgery. 3. Small pleural effusions. Electronically Signed   By: Lajean Manes M.D.   On: 05/16/2018 17:35        Scheduled Meds: . aspirin EC  81 mg Oral Daily  . clopidogrel  75 mg  Oral Daily  . [START ON 4/60/4799] folic acid  1 mg Oral Once per day on Mon Wed Fri  . levothyroxine  125 mcg Oral QAC breakfast  . metoprolol tartrate  12.5 mg Oral BID  . potassium chloride  40 mEq Oral Q4H  . tamsulosin  0.4 mg Oral Daily   Continuous Infusions: . cefTRIAXone (ROCEPHIN)  IV Stopped (05/17/18 0113)     LOS: 1 day    Time spent: 35 minutes    Irine Seal, MD [facility] Pager [phone]  If 7PM-7AM, please contact night-coverage [email or web address] Password [email or web address] 05/17/2018, 11:20 AM

## 2018-05-17 NOTE — Consult Note (Signed)
Cardiology Consultation Note    Patient ID: REMMY Campbell, MRN: [id number], DOB/AGE: 82-Oct-1927 82 y.o. Admit date: 05/16/2018   Date of Consult: 05/17/2018 Primary Physician: [name], MD Primary Cardiologist: Dr. Smith/Dr. Angelena Form  Chief Complaint: generalized weakness and leg swelling Reason for Consultation: troponinemia Requesting MD: Hal Hope  HPI: Tim Campbell is a 84 y.o. male with a history of CAD s/p 5V-CABG in 1993 with subsequent stenting to grafts (BMS to prox VG-PDA in 2012, BMS to SVG-OM in 03/2014), severe AS s/p TAVR in 06/2016 with 29 mm Sapien 3 valve, HFrEF with last known EF 40-45%, HTN, RBBB, HLD, hypothyroidism, large cell non-Hodgkins lympoma, BPH s/p thallium laser ablation of his prostate on 12/05/16 with indwelling catheter who presents from OSH with troponinemia in the setting of generalized weakness, leg swelling and hypotension. Of note, has a recent MRI lumbar spine demonstrating multifocal bone lesions most consistent with metastatic carcinoma.    He lives with his wife and daughter who assist him with his ADLs. His family felt that his physical and mental state has declined over the past week with decreased appetite and increased somnolence. Tim Campbell has multiple chronic medical problems and has been unable to get out of bed for the last 3 or 4 days. He has significant LE edema and his BP was in the 54M to 086P systolic at [facility] ED. Also noted to have positive UA and has indwelling catheter. Got IV Lasix x 1 in the ED, noted to have new TWI on EKG and positive troponin I to 1.73 with Cr 1.3 (AKI from baseline Cr of 0.96).  Tim Campbell denies any cardiac symptoms at this time, including chest pain, shortness of breath, syncope, dizziness, palpitations, nausea, PND, or orthopnea. He does not recall any recent episodes of chest pain.   Past Medical History:  Diagnosis Date  . AAA (abdominal aortic aneurysm) ([place])    a. 3.3x3.0cm  by CT 05/2016.  Marland Kitchen BPH (benign prostatic hyperplasia)   . Chronic combined systolic and diastolic HF (heart failure), NYHA class 3   . Coronary artery disease CARDIOLOGIST-  DR Angelena Form   a. s/p CABGx5 in 1993. b. NSTEMI with occluded VG-diag in 2005. c. BMS to prox VG-PDA 2012. d. BMS to SVG-OM 03/2014. e. stable cath 05/2016.  . Diverticulosis of colon   . Heart murmur   . History of colon polyps    2002;  2007  . History of non-ST elevation myocardial infarction (NSTEMI)    x2 --- 12-29-2003  and  01-01-2013  . Hypertension   . Hypothyroidism   . Lower urinary tract symptoms (LUTS)   . Mixed hyperlipidemia   . Non Hodgkin's lymphoma ([place]) dx 2006 bone marrow bx--- onocologist-  dr Marin Olp   s/p  chemo and radiation therpay--- in remission since ---  . OA (osteoarthritis)   . Peripheral neuropathy   . Pulmonary nodules    a. by CT 05/2016  . RBBB (right bundle branch block)   . S/p bare metal coronary artery stent    10-16-2010  BMS to RCA graft  . S/P CABG x 5    05/ 1993  . 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 Urinary retention   . Wears dentures    upper  . Wears glasses       Surgical History:  Past Surgical History:  Procedure Laterality Date  . CARDIAC CATHETERIZATION N/A 05/17/2016   Procedure: Right/Left Heart Cath and Coronary/Graft Angiography;  Surgeon: Nelva Bush, MD;  Location: [place] [facility];  Service: Cardiovascular;  Laterality: N/A;  severe CAD,  total occlusion of mLAD, dLCx, and mRCA, 99% ostial LCX/ wide patent LIMA to LAD & SVG to OM, patent SVG to PDA/PL w/ stable 50% ISR at ostium SVG/ chronic occluded SVG to Diagonal/ severe AVS  . CARDIAC CATHETERIZATION  12-30-2003  dr Leonia Reeves   severe 3v cad:  occlused SVG to diagonal causing NSTEMI,  ef 45%  . CATARACT EXTRACTION W/ INTRAOCULAR LENS  IMPLANT, BILATERAL    . COLONOSCOPY    . CORONARY ANGIOPLASTY WITH STENT PLACEMENT  10-20-2010  dr Daneen Schick   BMS to ostium of the  RCA graft:  total occlusion LAD, CFX, and RCA,  bypass graft 's-- total occlusion SVG to diagonal,  high-grade obstruction prox.Alfredo Batty segment of RCA graft,  mid SVG to OM 50% otherwise patent/  mild AVS inferior wall hypokinesis  . CORONARY ARTERY BYPASS GRAFT  05/ 1993     dr gerhart   LIMA to LAD,  SVG to D1,  SVT to RCA,  SVG to OM,  SVG to PDA  . [place]  . I&D EXTREMITY Left 03/09/2016   Procedure: IRRIGATION AND DEBRIDEMENT WRIST;  Surgeon: Milly Jakob, MD;  Location: [facility];  Service: Orthopedics;  Laterality: Left;  . INGUINAL HERNIA REPAIR Right 02/21/2006  . KNEE ARTHROSCOPY Right 1970'S   . LEFT AND RIGHT HEART CATHETERIZATION WITH CORONARY/GRAFT ANGIOGRAM N/A 04/05/2014   Procedure: LEFT AND RIGHT HEART CATHETERIZATION WITH [name];  Surgeon: Burnell Blanks, MD;  Location: [facility] C[facility] LAB;  Service: Cardiovascular;  Laterality: N/A;  . LEFT HEART CATHETERIZATION WITH CORONARY/GRAFT ANGIOGRAM N/A 01/02/2013   Procedure: LEFT HEART CATHETERIZATION WITH [name];  Surgeon: [name], MD;  Location: [facility] CATH LAB;  Service: Cardiovascular;  Laterality: N/A;NSTEMI:side-to-side anastomosis w/the PDA is threatened(not amenable to PCI)in-stent restenosis  RCA graft,  50% osyial SVG to OM , LIMA to LAD patent w/ diffuse distal LAD disease/  severe 3V CAD w/ occlusion LAD, CFX, RCA, ef 45%  . OPEN BX LEFT CERVICAL MASS, POSTERIOR NECK  10/03/2004  . OPEN REDUCTION INTERNAL FIXATION (ORIF) DISTAL RADIAL FRACTURE Left 02/24/2016   Procedure: OPEN TREATMENT OF LEFT DISTAL RADIUS FRACTURE;  Surgeon: Milly Jakob, MD;  Location: [place];  Service: Orthopedics;  Laterality: Left;  . ORIF RIGHT ANKLE FX  10/07/2008  . PERCUTANEOUS CORONARY STENT INTERVENTION (PCI-S) N/A 04/21/2014   Procedure: PERCUTANEOUS CORONARY STENT INTERVENTION (PCI-S);  Surgeon: [name], MD;  Location: [facility] C[facility] LAB;  Service: Cardiovascular;  Laterality: N/A;   BMS  in the ostial/proximal SVG to CFX,  stable 50-70% ostial SVG to RCA (previous stenting)  . TEE WITHOUT CARDIOVERSION N/A 06/26/2016   Procedure: TRANSESOPHAGEAL ECHOCARDIOGRAM (TEE);  Surgeon: Burnell Blanks, MD;  Location: [place];  Service: Open Heart Surgery;  Laterality: N/A;  . THULIUM LASER TURP (TRANSURETHRAL RESECTION OF PROSTATE) N/A 12/05/2016   Procedure: THULIUM LASER TURP (TRANSURETHRAL RESECTION OF PROSTATE);  Surgeon: Nickie Retort, MD;  Location: [facility];  Service: Urology;  Laterality: N/A;  . TRANSCATHETER AORTIC VALVE REPLACEMENT, TRANSFEMORAL Bilateral 06/26/2016   Procedure: TRANSCATHETER AORTIC VALVE REPLACEMENT, TRANSFEMORAL;  Surgeon: Burnell Blanks, MD;  Location: [place];  Service: Open Heart Surgery;  Laterality: Bilateral;  . TRANSTHORACIC ECHOCARDIOGRAM  08/03/2016    post TAVR    ef 40-45%,  akinesis and scarring of the basal-inferolateral, inferior, and inferoseptal (consistent w/  infarction in distribution of the RCA), severe hypokinesis of the basal anteroseptal (consistent w/ ischemia in distribution of the LAD but w/ patent distal graft),  grade 2 diastolic dysfunction/  AV stent valve bioprosthesis normal function w/ trivial regurg (valve area 1.16cm^2)/  . TRANSTHORACIC ECHOCARDIOGRAM  08-03-2016  continued results- post TAVR   mild dilated ascending aorta/  mild to moderate MR, peak grandient 67mmHg/  severe LAE and mild RAE/  mild PR/  trivial TR     Home Meds: Prior to Admission medications   Medication Sig Start Date End Date Taking? Authorizing Provider  acetaminophen (TYLENOL) 500 MG tablet Take 500 mg by mouth as needed.    [provider]  Ascorbic Acid (VITAMIN C) 1000 MG tablet Take 1,000 mg by mouth 3 (three) times a week.     [provider]  aspirin EC 81 MG tablet Take 81 mg by mouth daily.     [provider]  atorvastatin (LIPITOR) 40 MG tablet TAKE 1 TABLET (40 MG TOTAL) BY MOUTH DAILY AT 6 PM. PLEASE  SCHEDULE ANNUAL APPOINTMENT THANKS. 04/11/18   Belva Crome, MD  B Complex-C (B-COMPLEX WITH VITAMIN C) tablet Take 1 tablet by mouth 3 (three) times a week.     [provider]  clopidogrel (PLAVIX) 75 MG tablet TAKE 1 TABLET BY MOUTH EVERY DAY 02/21/18   Belva Crome, MD  folic acid (FOLVITE) 710 MCG tablet Take 800 mcg by mouth 3 (three) times a week.     [provider]  GARLIC PO Take by mouth.    [provider]  levothyroxine (SYNTHROID, LEVOTHROID) 125 MCG tablet Take 125 mcg by mouth daily before breakfast.  11/23/14   [provider]  Magnesium 200 MG TABS Take 200 mg by mouth daily.     [provider]  metoprolol tartrate (LOPRESSOR) 25 MG tablet Take 0.5 tablets (12.5 mg total) by mouth 2 (two) times daily. 09/18/17   Belva Crome, MD  Multiple Vitamins-Iron (CHLORELLA PO) Take 4 tablets by mouth 2 (two) times daily. MED NAME: CHLORELLA    [provider]  nitroGLYCERIN (NITROSTAT) 0.4 MG SL tablet Place 1 tablet (0.4 mg total) under the tongue every 5 (five) minutes as needed for chest pain. 01/22/17   Belva Crome, MD  RESVERATROL PO Take by mouth.    [provider]  SELENIUM PO Take 1 tablet by mouth 3 (three) times a week.     [provider]  tamsulosin (FLOMAX) 0.4 MG CAPS capsule Take 0.4 mg by mouth daily.  05/01/16   [provider]  tiZANidine (ZANAFLEX) 2 MG tablet Take 2 mg by mouth. 05/13/18   [provider]    Inpatient Medications:  . aspirin EC  81 mg Oral Daily  . clopidogrel  75 mg Oral Daily  . [START ON 04/02/9484] folic acid  1 mg Oral Once per day on Mon Wed Fri  . levothyroxine  125 mcg Oral QAC breakfast  . metoprolol tartrate  12.5 mg Oral BID  . tamsulosin  0.4 mg Oral Daily   . cefTRIAXone (ROCEPHIN)  IV 1 g (05/17/18 0041)    Allergies: No Known Allergies  Social History   Socioeconomic History  . Marital status: Married    Spouse name: Not on file  .  Number of children: 3  . Years of education: Not on file  . Highest education level: Not on file  Occupational History  . 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 Financial resource strain: Not on file  . Food insecurity:    Worry: Not on file    Inability: Not on file  . Transportation needs:    Medical: Not on file    Non-medical: Not on file  Tobacco Use  . Smoking status: Former Smoker    Packs/day: 0.50    Years: 15.00    Pack years: 7.50    Types: Cigarettes    Last attempt to quit: 06/22/1961    Years since quitting: 56.9  . Smokeless tobacco: Never Used  Substance and Sexual Activity  . Alcohol use: No  . Drug use: No  . Sexual activity: Not on file  Lifestyle  . Physical activity:    Days per week: Not on file    Minutes per session: Not on file  . Stress: Not on file  Relationships  . Social connections:    Talks on phone: Not on file    Gets together: Not on file    Attends religious service: Not on file    Active member of club or organization: Not on file    Attends meetings of clubs or organizations: Not on file    Relationship status: Not on file  . Intimate partner violence:    Fear of current or ex partner: Not on file    Emotionally abused: Not on file    Physically abused: Not on file    Forced sexual activity: Not on file  Other Topics Concern  . Not on file  Social History Narrative  . Not on file     Family History  Problem Relation Age of Onset  . Colon cancer Father   . Stroke Mother   . CAD Brother   . Stroke Sister   . Bone cancer Brother   . Parkinson's disease Brother   . Dementia Sister   . Stroke Sister      Review of Systems: General: negative for chills, fever, night sweats or weight changes.  Cardiovascular: negative for chest pain, edema, orthopnea, palpitations, paroxysmal nocturnal dyspnea, shortness of breath or dyspnea on exertion Dermatological: negative for  rash Respiratory: negative for cough or wheezing Abdominal: negative for nausea, vomiting, diarrhea, bright red blood per rectum, melena, or hematemesis Neurologic: negative for visual changes, syncope, or dizziness All other systems reviewed and are otherwise negative except as noted above.   Physical Exam: Blood pressure (!) 112/59, pulse 93, temperature 100.1 F (37.8 C), temperature source Oral, resp. rate (!) 27, height 5\' 10"  (1.778 m), weight 94.8 kg, SpO2 100 %. Body mass index is 29.99 kg/m. General: elderly appearing Head: Normocephalic, atraumatic, sclera non-icteric Neck: Negative for carotid bruits. JVD not elevated. Lungs: Clear bilaterally to auscultation without wheezes, rales, or rhonchi though with dullness at the bases bilaterally. Breathing is unlabored. Heart: RRR with S1 S2. Soft systolic murmur across the precordium. Abdomen: Soft, non-tender, non-distended with normoactive bowel sounds. No hepatomegaly. No rebound/guarding. No obvious abdominal masses. Msk:  Strength and tone appear normal for age. Extremities: No clubbing or cyanosis. Bilateral 2+ edema. Distal pedal pulses are 2+ and equal bilaterally. Neuro: Moves all extremities spontaneously.    Labs: Recent Labs    05/16/18 1814 05/17/18 0006  TROPONINI 1.73* 1.57*   Lab Results  Component Value Date   WBC 5.7 05/16/2018   HGB 12.0 (L) 05/16/2018   HCT 35.8 (L) 05/16/2018   MCV 81.7 05/16/2018   PLT  75 (L) 05/16/2018    Recent Labs  Lab 05/16/18 1450  NA 135  K 3.3*  CL 98  CO2 27  BUN 36*  CREATININE 1.33*  CALCIUM 7.9*  PROT 5.7*  BILITOT 1.0  ALKPHOS 69  ALT 44  AST 101*  GLUCOSE 132*   No results found for: CHOL, HDL, LDLCALC, TRIG No results found for: DDIMER  Radiology/Studies:  Dg Chest 2 View  Result Date: 05/16/2018 CLINICAL DATA:  Acute lower extremity swelling. No fever. Cough and congestion. EXAM: CHEST - 2 VIEW COMPARISON:  12/17/2016 FINDINGS: There changes from  previous cardiac surgery and aortic valve replacement. Cardiac silhouette is mildly enlarged. No mediastinal or hilar masses. No evidence of adenopathy. Small pleural effusions. Prominent bronchovascular markings without overt pulmonary edema. No evidence of pneumonia. No pneumothorax. Skeletal structures are demineralized but grossly intact. IMPRESSION: 1. No acute cardiopulmonary disease. 2. Mild cardiomegaly with stable changes from prior cardiac surgery. 3. Small pleural effusions. Electronically Signed   By: Lajean Manes M.D.   On: 05/16/2018 17:35   Mr Lumbar Spine Wo Contrast  Result Date: 05/11/2018 CLINICAL DATA:  Back pain. Left leg and hip pain over the last 3 weeks. Distant history of lymphoma. EXAM: MRI LUMBAR SPINE WITHOUT CONTRAST TECHNIQUE: Multiplanar, multisequence MR imaging of the lumbar spine was performed. No intravenous contrast was administered. COMPARISON:  CT 06/06/2016. FINDINGS: Segmentation:  5 lumbar type vertebral bodies. Alignment:  No significant malalignment. Vertebrae: Multiple marrow space lesions scattered throughout the region consistent with metastatic carcinoma or possibly multifocal lymphoma. There are foci of involvement at T11, T12, L1, L2, L3, L4 and the sacrum. Both iliac bones are involved. No extraosseous tumor is seen. No tumor encroachment upon the spinal canal or neural foramina. Conus medullaris and cauda equina: Conus extends to the L1 level. Conus and cauda equina appear normal. Paraspinal and other soft tissues: Distended bladder with trabeculation. Bilateral hydroureteronephrosis, most likely secondary to chronic bladder outlet obstruction. Mild aneurysmal dilatation of the infrarenal abdominal aorta. Maximal diameter 3.3 cm. Disc levels: T12-L1: Mild bulging of the disc.  No compressive stenosis. L1-2: Normal disc space. L2-3: Endplate osteophytes and bulging of the disc. Mild stenosis of both lateral recesses but no definite neural compression. L3-4:  Endplate osteophytes and bulging of the disc. Mild stenosis of the lateral recesses. Some potential for neural compression particularly on the right. L4-5: Endplate osteophytes and bulging of the disc. Facet and ligamentous hypertrophy. Stenosis of the lateral recesses right more than left. Some potential for neural compression on the right. L5-S1: Endplate osteophytes and bulging of the disc. No central canal stenosis. No compressive foraminal stenosis. IMPRESSION: Multifocal bone lesions scattered throughout the lower thoracic spine, sacrum and iliac bones most consistent with metastatic carcinoma. Multifocal lymphoma can have this appearance. No pathologic fracture. No extraosseous tumor or apparent effect upon the neural structures. Distended bladder. Bilateral hydroureteronephrosis. Findings suggest chronic bladder outlet obstruction. Infrarenal abdominal aortic aneurysm maximal diameter 3.3 cm. Recommend followup by ultrasound in 3 years. This recommendation follows ACR consensus guidelines: White Paper of the ACR Incidental Findings Committee II on Vascular Findings. 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 No compressive stenosis seen to explain left leg symptoms. Mild right more than left lateral recess stenosis at L3-4 and L4-5 that could possibly cause right-sided symptoms. These results will be called to the ordering clinician or representative by the Radiologist Assistant, and communication documented in the PACS or  zVision Dashboard. Routine call during normal Monday hours is planned. Electronically Signed   By: Nelson Chimes M.D.   On: 05/11/2018 14:23    Wt Readings from Last 3 Encounters:  05/16/18 94.8 kg  05/07/18 94.8 kg  07/10/17 96.6 kg    EKG: NSR with TWI in V4-V6 that appear new, otherwise with old anteroseptal infarct, inferior TWI  TTE  07/10/17: ------------------------------------------------------------------- Study Conclusions  - Left ventricle: The cavity size was normal. There was mild   concentric hypertrophy. Systolic function was mildly to   moderately reduced. The estimated ejection fraction was in the   range of 40% to 45%. Diffuse hypokinesis. There is akinesis of   the basal-midinferolateral, inferior, and inferoseptal   myocardium. - Aortic valve: A 32mm Sapien 3 TAVR valve placed 06/26/16   prosthesis was present and functioning normally. Peak velocity   (S): 303 cm/s. Mean gradient (S): 20 mm Hg. - Mitral valve: Calcified annulus. There was mild regurgitation. - Left atrium: The atrium was severely dilated. Volume/bsa, ES,   (1-plane Simpson&'s, A2C): 54.7 ml/m^2. - Tricuspid valve: There was mild regurgitation. - Pulmonary arteries: Systolic pressure was mildly increased. PA   peak pressure: 35 mm Hg (S).  Impressions:  - Compared to the prior study, there has been no significant   interval change.  LHC 05/17/2016:  Ost Cx lesion, 99 %stenosed.  Mid Cx to Dist Cx lesion, 100 %stenosed.  LM lesion, 50 %stenosed.  Ost LAD to Prox LAD lesion, 70 %stenosed.  Prox LAD lesion, 100 %stenosed.  LIMA graft was visualized by angiography and is normal in caliber and anatomically normal.  Mid LAD to Dist LAD lesion, 60 %stenosed.  SVG graft was visualized by angiography.  Prox Graft lesion, 100 %stenosed.  SVG graft was visualized by angiography and is normal in caliber.  The graft exhibits minimal luminal irregularities.  Origin to Prox Graft lesion, 0 %stenosed.  Mid RCA lesion, 100 %stenosed.  RPDA lesion, 95 %stenosed.  Dist RCA lesion, 100 %stenosed.  LV end diastolic pressure is moderately elevated.  There is moderate aortic valve stenosis.  Origin to Prox Graft lesion, 50 %stenosed.  Ost 1st Mrg lesion, 100 %stenosed.   1.  Severe native coronary artery disease, including  total occlusions of mid LAD, distal LCx, and mid RCA, as well as 99% ostial LCx stenosis. 2.  Widely patent LIMA to LAD and SVG to OM. 3.  Patent SVG to PDA and PL with stable 50% ISR at ostium of SVG. Stable compared to prior angiogram from 2015. 4.  Chronically occluded SVG to diagonal. 5.  Normal right heart filling pressures. 6.  Mildly to moderately elevated left ventricular filling pressure. 7.  Moderate to severe aortic stenosis.  Plan: 1.  Continue medical therapy of coronary artery disease, as overall appearance is similar to prior catheterization in 2015. 2.  Patient continues to decline TAVR; would benefit from referral back to TAVR team if he decides to proceed with valve intervention in the future.   Assessment and Plan  Tim Campbell is an 84 y/o gentleman with a history of CAD s/p 5V-CABG in 1993 with subsequent stenting to grafts (BMS to prox VG-PDA in 2012, BMS to SVG-OM in 03/2014), severe AS s/p TAVR in 06/2016 with 29 mm Sapien 3 valve, HFrEF with last known EF 40-45%, HTN, RBBB, HLD, hypothyroidism, large cell non-Hodgkins lympoma, BPH s/p thallium laser ablation of his prostate on 12/05/16 with indwelling catheter who presents from OSH with troponinemia in the  setting of generalized weakness, leg swelling and hypotension.  -This likely represents a type 2 MI in the setting of demand ischemia from acute illness. Late presenting type 1 event is also possible given downtrending troponin and EKG changes from prior but the patient does not give any history of symptoms to suggest such an event and risks of an invasive procedure in a patient with metastatic cancer and this level of thrombocytopenia are not trivial. -reasonable to pursue TTE to evaluate for change in LV function and WM -Can cycle another troponin. -hold off on heparin gtt at this time given lack of convincing story for type I event and thrombocytopenia with plts 75K -can continue home DAPT as you are -can continue  home BB as you are. -Appreciate ongoing workup of patient's weakness/fatigue/hypotension per the primary team.  Thank you for this interesting consult.  Signed, Verneita Griffes, MD 05/17/2018, 2:16 AM

## 2018-05-18 ENCOUNTER — Inpatient Hospital Stay (HOSPITAL_COMMUNITY): Payer: Medicare Other

## 2018-05-18 LAB — BASIC METABOLIC PANEL
Anion gap: 10 (ref 5–15)
BUN: 28 mg/dL — ABNORMAL HIGH (ref 8–23)
CHLORIDE: 98 mmol/L (ref 98–111)
CO2: 28 mmol/L (ref 22–32)
CREATININE: 1.22 mg/dL (ref 0.61–1.24)
Calcium: 8 mg/dL — ABNORMAL LOW (ref 8.9–10.3)
GFR calc non Af Amer: 51 mL/min — ABNORMAL LOW (ref >60)
GFR, EST AFRICAN AMERICAN: 59 mL/min — AB (ref >60)
Glucose, Bld: 128 mg/dL — ABNORMAL HIGH (ref 70–99)
Potassium: 3.8 mmol/L (ref 3.5–5.1)
SODIUM: 136 mmol/L (ref 135–145)

## 2018-05-18 LAB — URINE CULTURE: Culture: >=100000 — AB

## 2018-05-18 LAB — MAGNESIUM: Magnesium: 1.9 mg/dL (ref 1.7–2.4)

## 2018-05-18 MED ORDER — CARVEDILOL 3.125 MG PO TABS
3.1250 mg | ORAL_TABLET | Freq: Two times a day (BID) | ORAL | Status: DC
  Administered 2018-05-18 – 2018-05-23 (×10): 3.125 mg via ORAL
  Filled 2018-05-18 (×10): qty 1

## 2018-05-18 MED ORDER — FUROSEMIDE 20 MG PO TABS
20.0000 mg | ORAL_TABLET | Freq: Every day | ORAL | Status: DC
  Administered 2018-05-18 – 2018-05-19 (×2): 20 mg via ORAL
  Filled 2018-05-18 (×2): qty 1

## 2018-05-18 MED ORDER — AMOXICILLIN 500 MG PO CAPS
500.0000 mg | ORAL_CAPSULE | Freq: Three times a day (TID) | ORAL | Status: DC
  Administered 2018-05-18: 500 mg via ORAL
  Filled 2018-05-18 (×2): qty 1

## 2018-05-18 MED ORDER — AMOXICILLIN-POT CLAVULANATE 875-125 MG PO TABS
1.0000 | ORAL_TABLET | Freq: Two times a day (BID) | ORAL | Status: DC
  Administered 2018-05-18 – 2018-05-23 (×10): 1 via ORAL
  Filled 2018-05-18 (×11): qty 1

## 2018-05-18 NOTE — Progress Notes (Signed)
OT Cancellation Note  Patient Details Name: Tim Campbell MRN: [id number] DOB: March 10, 1926   Cancelled Treatment:    Reason Eval/Treat Not Completed: Patient at procedure or test/ unavailable(CT. Will return as schedule allows.)  Jenkins, OTR/L Acute Rehab Pager: [phone] Office: [phone] 05/18/2018, 11:55 AM

## 2018-05-18 NOTE — Progress Notes (Signed)
PROGRESS NOTE    Tim Campbell  [name]:[id number] DOB: 1926/09/28 DOA: 05/16/2018 PCP: Tim Huddle, MD    Brief Narrative:  Patient is 84 year old gentleman history of coronary artery disease status post CABG, status post TAVR, hypothyroidism, hyperlipidemia, history of lymphoma variant been experiencing low back pain had MRI done which showed multiple metastatic lesions patient was subsequently referred to his oncologist by [facility] orthopedic surgeon.  Patient was seen by oncologist due to patient's complaints of generalized weakness he was referred to the ED.  Patient noted to have lower extremity edema.  Patient noted in the ED to have borderline blood pressure with a elevated troponin and EKG showing lateral T wave changes.  Lower extremity edema was concerning to EDP for volume overload and patient received a dose of IV Lasix.  Cardiology was consulted and patient admitted for non-ST elevated MI.   Assessment & Plan:   Principal Problem:   Non-ST elevated myocardial infarction [facility]) Active Problems:   Acute on chronic combined systolic and diastolic heart failure (HCC)   S/P TAVR (transcatheter aortic valve replacement)   Thrombocytopenia (HCC)   Hypothyroidism   AAA (abdominal aortic aneurysm) (HCC)   Acute on chronic systolic CHF (congestive heart failure) (HCC)   Acute lower UTI   Bilateral lower extremity edema  1 non-ST elevated MI/coronary artery disease/cardiomyopathy Patient presenting with generalized weakness and back pain with MRI findings in the outpatient setting for metastatic disease.  Patient denies any acute chest pain or shortness of breath on evaluation.  Troponins elevated at 1.73, 1.57, 1.33, 1.30.  2D echo pending.  Due to thrombocytopenia patient not placed on IV heparin.  2D echo with a EF of 30 to 35%.  Cardiology recommending conservative treatment at this time with medical therapy.  Continue aspirin.  Lopressor has been changed to Coreg per cardiology.   Plavix discontinued per cardiology secondary to thrombocytopenia.  Statin resumed.  Cardiology consulted and are following.  2. BLE edema.??  Acute on chronic CHF exacerbation. Patient with history of cardiomyopathy.  Patient with borderline blood pressure.  Patient given a dose of IV Lasix.  Lower extremity edema may be secondary to cachexia and hypoalbuminemia.  Lower extremity Dopplers ordered and negative for DVT.  2D echo with a EF of 30 to 35%.  Patient denies any shortness of breath.  BNP elevated at 855.2.  Patient seemed to have auto diuresed over the past 24 hours with a urine output of 1.550 L.  Patient seen by cardiology and patient started on low-dose oral Lasix 20 mg daily.  Patient started on Coreg low-dose per cardiology. Cardiology following.  3.  Hypothyroidism TSH at 2.020.  Continue home dose Synthroid.  4.  AAA Outpatient follow-up.  5.  Enterococcus faecalis/Klebsiella pneumonia UTI  Patient with chronic indwelling Foley catheter.  Urine cultures with Enterococcus faecalis and Klebsiella pneumonia.  DC IV Rocephin.  Placed on oral Augmentin to complete course of antibiotic treatment.    6.  Metastatic spinal lesions/anemia/thrombocytopenia Patient's oncologist has been informed via epic of patient's admission and patient was sent to the ED by his oncologist.  Will defer further work-up of metastatic spinal lesions to oncology.  Patient with no overt bleeding.  Plavix has been discontinued per cardiology.  Likely outpatient follow-up with oncology.  Follow.  7.  Generalized weakness Likely secondary to problem #6.  PT/OT.  Will likely need skilled nursing facility.  Follow.  8.  Hx of TAVR Continue aspirin.  Per cardiology  9.  Hypokalemia/hypomagnesemia  Likely secondary to diuresis from admission.  Repleted.  Follow.  10.  Bouts of confusion Per daughter patient with small bouts of confusion.  Due to metastatic disease we will get a CT of the head.  Follow.   DVT  prophylaxis: SCDs Code Status: Partial Family Communication: Updated patient and daughter at bedside. Disposition Plan: To be determined.   Consultants:   Cardiology: Dr. Aldine Contes 05/17/2018  Procedures:  Lower extremity Dopplers  05/17/2018  2D echo.  05/17/2018  CT head 05/18/2018  Antimicrobials:   IV Rocephin 05/17/2018>>>> 05/18/2018  Augmentin 05/18/2018   Subjective: Patient laying in bed.  Denies any chest pain or shortness of breath.  States he is feeling better.  Per daughter lower extremity edema improved since admission.  Per daughter patient with some bouts of confusion occasionally however very minimal.   Objective: Vitals:   05/17/18 2321 05/18/18 0405 05/18/18 0727 05/18/18 0908  BP: (!) 97/49 (!) 95/53 (!) 109/56 (!) 102/50  Pulse: 79 83 86 87  Resp: (!) 22 (!) 26 (!) 21 (!) 31  Temp: 97.8 F (36.6 C) 97.9 F (36.6 C) 98.2 F (36.8 C)   TempSrc: Oral Axillary Oral   SpO2: 100% 100% 97% 100%  Weight:  93.4 kg    Height:        Intake/Output Summary (Last 24 hours) at 05/18/2018 1111 Last data filed at 05/18/2018 0430 Gross per 24 hour  Intake 1324 ml  Output 650 ml  Net 674 ml   Filed Weights   05/16/18 1425 05/17/18 0428 05/18/18 0405  Weight: 94.8 kg 90.6 kg 93.4 kg    Examination:  General exam: NAD Respiratory system: CTAB.  No wheezes, no crackles, no rhonchi.  Respiratory effort normal. Cardiovascular system: Regular rate and rhythm no murmurs rubs or gallops.  No JVD.  1-2+ bilateral lower extremity edema. Gastrointestinal system: Abdomen is soft, nontender, nondistended, positive bowel sounds.  No rebound.  No guarding.   Central nervous system: Alert and oriented. No focal neurological deficits. Extremities: Symmetric 5 x 5 power. Skin: No rashes, lesions or ulcers Psychiatry: Judgement and insight appear normal. Mood & affect appropriate.     Data Reviewed: I have personally reviewed following labs and imaging studies  CBC: Recent  Labs  Lab 05/16/18 1226 05/16/18 1450 05/17/18 0601  WBC 5.3 5.7 5.4  NEUTROABS 4.3 4.4 4.2  HGB 11.7* 12.0* 11.1*  HCT 35.4* 35.8* 34.1*  MCV 82.9 81.7 83.4  PLT 78* 75* 56*   Basic Metabolic Panel: Recent Labs  Lab 05/16/18 1226 05/16/18 1450 05/17/18 0006 05/17/18 0601 05/18/18 0404  NA 135 135  --  138 136  K 3.7 3.3*  --  3.0* 3.8  CL 99 98  --  99 98  CO2 25 27  --  29 28  GLUCOSE 112* 132*  --  112* 128*  BUN 34* 36*  --  30* 28*  CREATININE 1.16 1.33*  --  1.38* 1.22  CALCIUM 8.0* 7.9*  --  7.8* 8.0*  MG  --   --  1.5*  --  1.9   GFR: Estimated Creatinine Clearance: 47.1 mL/min (by C-G formula based on SCr of 1.22 mg/dL). Liver Function Tests: Recent Labs  Lab 05/16/18 1226 05/16/18 1450 05/17/18 0601  AST 103* 101* 82*  ALT 46* 44 40  ALKPHOS 87 69 65  BILITOT 1.0 1.0 1.0  PROT 5.6* 5.7* 4.7*  ALBUMIN 2.8* 2.9* 2.4*   No results for input(s): LIPASE, AMYLASE in the last  168 hours. No results for input(s): AMMONIA in the last 168 hours. Coagulation Profile: No results for input(s): INR, PROTIME in the last 168 hours. Cardiac Enzymes: Recent Labs  Lab 05/16/18 1814 05/17/18 0006 05/17/18 0601 05/17/18 1145  TROPONINI 1.73* 1.57* 1.33* 1.30*   BNP (last 3 results) No results for input(s): PROBNP in the last 8760 hours. HbA1C: Recent Labs    05/17/18 0601  HGBA1C 5.7*   CBG: No results for input(s): GLUCAP in the last 168 hours. Lipid Profile: No results for input(s): CHOL, HDL, LDLCALC, TRIG, CHOLHDL, LDLDIRECT in the last 72 hours. Thyroid Function Tests: Recent Labs    05/17/18 0006  TSH 2.020   Anemia Panel: No results for input(s): VITAMINB12, FOLATE, FERRITIN, TIBC, IRON, RETICCTPCT in the last 72 hours. Sepsis Labs: Recent Labs  Lab 05/16/18 1555  LATICACIDVEN 1.50    Recent Results (from the past 240 hour(s))  Urine culture     Status: Abnormal   Collection Time: 05/16/18  3:07 PM  Result Value Ref Range Status    Specimen Description   Final    URINE, RANDOM Performed at [facility], [address] [place], [place] [address]    Special Requests   Final    NONE Performed at [facility], [address] [place], [place] [address]    Culture (A)  Final    >=100,000 COLONIES/mL ENTEROCOCCUS FAECALIS 20,000 COLONIES/mL KLEBSIELLA PNEUMONIAE    Report Status 05/18/2018 FINAL  Final   Organism ID, Bacteria ENTEROCOCCUS FAECALIS (A)  Final   Organism ID, Bacteria KLEBSIELLA PNEUMONIAE (A)  Final      Susceptibility   Enterococcus faecalis - MIC*    AMPICILLIN <=2 SENSITIVE Sensitive     LEVOFLOXACIN 1 SENSITIVE Sensitive     NITROFURANTOIN <=16 SENSITIVE Sensitive     VANCOMYCIN 1 SENSITIVE Sensitive     * >=100,000 COLONIES/mL ENTEROCOCCUS FAECALIS   Klebsiella pneumoniae - MIC*    AMPICILLIN RESISTANT Resistant     CEFAZOLIN <=4 SENSITIVE Sensitive     CEFTRIAXONE <=1 SENSITIVE Sensitive     CIPROFLOXACIN <=0.25 SENSITIVE Sensitive     GENTAMICIN <=1 SENSITIVE Sensitive     IMIPENEM <=0.25 SENSITIVE Sensitive     NITROFURANTOIN 32 SENSITIVE Sensitive     TRIMETH/SULFA <=20 SENSITIVE Sensitive     AMPICILLIN/SULBACTAM 4 SENSITIVE Sensitive     PIP/TAZO <=4 SENSITIVE Sensitive     Extended ESBL NEGATIVE Sensitive     * 20,000 COLONIES/mL KLEBSIELLA PNEUMONIAE  MRSA PCR Screening     Status: None   Collection Time: 05/17/18  3:15 AM  Result Value Ref Range Status   MRSA by PCR NEGATIVE NEGATIVE Final    Comment:        The GeneXpert MRSA Assay (FDA approved for NASAL specimens only), is one component of a comprehensive MRSA colonization surveillance program. It is not intended to diagnose MRSA infection nor to guide or monitor treatment for MRSA infections. Performed at [facility], [place] [address], [place], [place] [address]          Radiology Studies: Dg Chest 2 View  Result Date: 05/16/2018 CLINICAL DATA:  Acute lower extremity swelling. No  fever. Cough and congestion. EXAM: CHEST - 2 VIEW COMPARISON:  12/17/2016 FINDINGS: There changes from previous cardiac surgery and aortic valve replacement. Cardiac silhouette is mildly enlarged. No mediastinal or hilar masses. No evidence of adenopathy. Small pleural effusions. Prominent bronchovascular markings without overt pulmonary edema. No  evidence of pneumonia. No pneumothorax. Skeletal structures are demineralized but grossly intact. IMPRESSION: 1. No acute cardiopulmonary disease. 2. Mild cardiomegaly with stable changes from prior cardiac surgery. 3. Small pleural effusions. Electronically Signed   By: Lajean Manes M.D.   On: 05/16/2018 17:35        Scheduled Meds: . amoxicillin  500 mg Oral Q8H  . aspirin EC  81 mg Oral Daily  . carvedilol  3.125 mg Oral BID WC  . [START ON 5/94/7076] folic acid  1 mg Oral Once per day on Mon Wed Fri  . furosemide  20 mg Oral Daily  . levothyroxine  125 mcg Oral QAC breakfast  . tamsulosin  0.4 mg Oral Daily   Continuous Infusions:    LOS: 2 days    Time spent: 35 minutes    Irine Seal, MD [facility] Pager [phone]  If 7PM-7AM, please contact night-coverage [email or web address] Password [email or web address] 05/18/2018, 11:11 AM

## 2018-05-18 NOTE — Evaluation (Signed)
Occupational Therapy Evaluation Patient Details Name: Tim Campbell MRN: [id number] DOB: 1926/05/17 Today's Date: 05/18/2018    History of Present Illness 84 y.o. male admitted to ED by referral from oncologist for generalized weakness. 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 PMH includes. Non Hodgkins lymphoma, TAVR, CABG, peripheral neuropathy, OA, AAA, CAD, R ankle ORF, L distal radial fx.    Clinical Impression   PTA, pt was living with his wife and was perform BADLs and using RW for functional mobility. Pt currently requiring Min A for UB ADLs, Max A for LB ADLs, and Mod A +2 for functional mobility with RW. Pt presenting with decreased balance, strength, and activity tolerance and presents as a fall risk. Pt highly motivated to participate in therapy and return to PLOF. Pt would benefit from further acute OT to facilitate safe dc. Recommend dc to SNF for further OT to optimize safety, independence with ADLs, and return to PLOF.      Follow Up Recommendations  SNF;Supervision/Assistance - 24 hour    Equipment Recommendations  None recommended by OT    Recommendations for Other Services PT consult     Precautions / Restrictions Precautions Precautions: Fall      Mobility Bed Mobility Overal bed mobility: Needs Assistance Bed Mobility: Supine to Sit     Supine to sit: Max assist;+2 for physical assistance     General bed mobility comments: Max  A x2 to support trunk bring legs over EOB  Transfers Overall transfer level: Needs assistance Equipment used: Rolling walker (2 wheeled) Transfers: Sit to/from Stand Sit to Stand: Mod assist;+2 physical assistance         General transfer comment: Mod A x2, cues for hand placement     Balance Overall balance assessment: Needs assistance Sitting-balance support: No upper extremity supported;Feet supported Sitting balance-Leahy Scale: Fair     Standing balance support: Bilateral upper  extremity supported;During functional activity Standing balance-Leahy Scale: Poor                             ADL either performed or assessed with clinical judgement   ADL Overall ADL's : Needs assistance/impaired Eating/Feeding: Set up;Sitting   Grooming: Set up;Supervision/safety;Sitting   Upper Body Bathing: Minimal assistance;Sitting   Lower Body Bathing: Maximal assistance;+2 for physical assistance;Sit to/from stand   Upper Body Dressing : Minimal assistance;Sitting   Lower Body Dressing: Maximal assistance;+2 for physical assistance;Sit to/from stand   Toilet Transfer: +2 for physical assistance;Ambulation;RW;Moderate assistance(Simulated to recliner)           Functional mobility during ADLs: Moderate assistance;+2 for physical assistance;Rolling walker;Cueing for safety;Cueing for sequencing General ADL Comments: Pt presenting with decreased balance, strength, and activity tolerance.      Vision Baseline Vision/History: Wears glasses Patient Visual Report: No change from baseline       Perception     Praxis      Pertinent Vitals/Pain Pain Assessment: Faces Faces Pain Scale: Hurts a little bit Pain Location: low back Pain Descriptors / Indicators: Aching;Discomfort Pain Intervention(s): Monitored during session;Limited activity within patient's tolerance;Repositioned     Hand Dominance     Extremity/Trunk Assessment Upper Extremity Assessment Upper Extremity Assessment: Overall WFL for tasks assessed   Lower Extremity Assessment Lower Extremity Assessment: Defer to PT evaluation;Generalized weakness   Cervical / Trunk Assessment Cervical / Trunk Assessment: Kyphotic(METS noted on MRI )   Communication Communication Communication: HOH   Cognition Arousal/Alertness: Awake/alert  Behavior During Therapy: WFL for tasks assessed/performed Overall Cognitive Status: Within Functional Limits for tasks assessed                                      General Comments  SpO2 in 90s on RA. HR 80s    Exercises     Shoulder Instructions      Home Living Family/patient expects to be discharged to:: Private residence Living Arrangements: Spouse/significant other;Children Available Help at Discharge: Family;Available 24 hours/day Type of Home: House Home Access: Stairs to enter [employer] of Steps: 4 Entrance Stairs-Rails: Can reach both Home Layout: One level     Bathroom Shower/Tub: Teacher, early years/pre: Handicapped height Bathroom Accessibility: Yes   Home Equipment: Environmental consultant - 2 wheels;Cane - single point          Prior Functioning/Environment Level of Independence: Needs assistance  Gait / Transfers Assistance Needed: Limited ambulation SPC or RW. ADL's / Homemaking Assistance Needed: Requiring assistance for IADLs and perform BADLs with increased time and effort.            OT Problem List: Decreased strength;Decreased range of motion;Decreased activity tolerance;Impaired balance (sitting and/or standing);Decreased knowledge of use of DME or AE;Decreased knowledge of precautions;Cardiopulmonary status limiting activity      OT Treatment/Interventions: Self-care/ADL training;Therapeutic exercise;Energy conservation;DME and/or AE instruction;Therapeutic activities;Patient/family education    OT Goals(Current goals can be found in the care plan section) Acute Rehab OT Goals Patient Stated Goal: get stronger OT Goal Formulation: With patient/family Time For Goal Achievement: 06/01/18 Potential to Achieve Goals: Good ADL Goals Pt Will Perform Grooming: with min guard assist;standing Pt Will Perform Upper Body Dressing: with set-up;with supervision;sitting Pt Will Perform Lower Body Dressing: with min assist;sit to/from stand Pt Will Transfer to Toilet: with min assist;bedside commode;ambulating Pt Will Perform Toileting - Clothing Manipulation and hygiene: with min  assist;sit to/from stand  OT Frequency: Min 2X/week   Barriers to D/C:            Co-evaluation PT/OT/SLP Co-Evaluation/Treatment: Yes Reason for Co-Treatment: For patient/therapist safety;To address functional/ADL transfers PT goals addressed during session: Mobility/safety with mobility;Balance;Proper use of DME;Strengthening/ROM OT goals addressed during session: ADL's and self-care      AM-PAC PT "6 Clicks" Daily Activity     Outcome Measure Help from another person eating meals?: None Help from another person taking care of personal grooming?: A Little Help from another person toileting, which includes using toliet, bedpan, or urinal?: A Lot Help from another person bathing (including washing, rinsing, drying)?: A Lot Help from another person to put on and taking off regular upper body clothing?: A Little Help from another person to put on and taking off regular lower body clothing?: A Lot 6 Click Score: 16   End of Session Equipment Utilized During Treatment: Gait belt;Rolling walker Nurse Communication: Mobility status  Activity Tolerance: Patient tolerated treatment well Patient left: in chair;with call bell/phone within reach;with family/visitor present  OT Visit Diagnosis: Unsteadiness on feet (R26.81);Other abnormalities of gait and mobility (R26.89);Muscle weakness (generalized) (M62.81)                Time: 6045-4098 OT Time Calculation (min): 44 min Charges:  OT General Charges $OT Visit: 1 Visit OT Evaluation $OT Eval Moderate Complexity: [place], OTR/L Acute Rehab Pager: [phone] Office: [place] 05/18/2018, 4:49 PM

## 2018-05-18 NOTE — Progress Notes (Signed)
PT Cancellation Note  Patient Details Name: Tim Campbell MRN: [id number] DOB: May 03, 1926   Cancelled Treatment:    Reason Eval/Treat Not Completed: Patient at procedure or test/unavailable CT will re attempt as schedule allows.   Reinaldo Berber, PT, DPT Acute Rehab Services Pager: [phone]     Reinaldo Berber 05/18/2018, 11:56 AM

## 2018-05-18 NOTE — Telephone Encounter (Signed)
Let them know I am aware of admission. Illness is multifactorial and not pure;y heart.

## 2018-05-18 NOTE — Evaluation (Signed)
Physical Therapy Evaluation Patient Details Name: Tim Campbell MRN: [id number] DOB: September 03, 1926 Today's Date: 05/18/2018   History of Present Illness  84 y.o. male admitted to ED by referral from oncologist for generalized weakness. 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 PMH includes. Non Hodgkins lymphoma, TAVR, CABG, peripheral neuropathy, OA, AAA, CAD, R ankle ORF, L distal radial fx.     Clinical Impression  Pt admitted with above diagnosis. Pt currently with functional limitations due to the deficits listed below (see PT Problem List). PTA, pt living at home with his wife mod I with mobility in the home with worsening weakness. Upon eval pt presents with limited activity tolerance and  BLE weakness. Max A for all mobility at this time. Unable to return home with wife's limited support.  Discussed with family need for SNF, they are agreeable, pt is agreeable.  Pt will benefit from skilled PT to increase their independence and safety with mobility to allow discharge to the venue listed below.      Follow Up Recommendations SNF;Supervision/Assistance - 24 hour    Equipment Recommendations  (TBD next venue)    Recommendations for Other Services       Precautions / Restrictions Precautions Precautions: Fall      Mobility  Bed Mobility Overal bed mobility: Needs Assistance Bed Mobility: Supine to Sit     Supine to sit: Max assist     General bed mobility comments: Max  A x2 to support trunk bring legs over EOB  Transfers Overall transfer level: Needs assistance Equipment used: Rolling walker (2 wheeled) Transfers: Sit to/from Stand Sit to Stand: Mod assist         General transfer comment: Mod A x2, cues for hand placement   Ambulation/Gait Ambulation/Gait assistance: Min assist;+2 physical assistance;+2 safety/equipment Gait Distance (Feet): 8 Feet Assistive device: Rolling walker (2 wheeled) Gait Pattern/deviations: Step-to  pattern Gait velocity: decreased   General Gait Details: pt with limited hip flexion on L leg, causing signicant challanges ambulatiing, with min A can advance L leg forward. walking with min A x2 for stability at this time.  Stairs            Wheelchair Mobility    Modified Rankin (Stroke Patients Only)       Balance Overall balance assessment: Needs assistance   Sitting balance-Leahy Scale: Fair       Standing balance-Leahy Scale: Poor                               Pertinent Vitals/Pain Pain Assessment: Faces Faces Pain Scale: Hurts a little bit Pain Location: low back Pain Descriptors / Indicators: Aching;Discomfort Pain Intervention(s): Limited activity within patient's tolerance;Monitored during session    Roxie expects to be discharged to:: Private residence Living Arrangements: Spouse/significant other;Children Available Help at Discharge: Family;Available 24 hours/day Type of Home: House Home Access: Stairs to enter Entrance Stairs-Rails: Can reach both Entrance Stairs-Number of Steps: 4 Home Layout: One level Home Equipment: Walker - 2 wheels;Cane - single point      Prior Function Level of Independence: Needs assistance   Gait / Transfers Assistance Needed: limimted community ambulation SPC or RW           Hand Dominance        Extremity/Trunk Assessment   Upper Extremity Assessment Upper Extremity Assessment: Defer to OT evaluation    Lower Extremity Assessment Lower Extremity Assessment: (3/5 gross  strength, L hip flexion limited 2/5)    Cervical / Trunk Assessment Cervical / Trunk Assessment: Kyphotic(METS noted on MRI )  Communication      Cognition Arousal/Alertness: Awake/alert Behavior During Therapy: WFL for tasks assessed/performed Overall Cognitive Status: Within Functional Limits for tasks assessed                                        General Comments       Exercises     Assessment/Plan    PT Assessment Patient needs continued PT services  PT Problem List Decreased strength;Decreased range of motion;Decreased activity tolerance;Decreased balance;Decreased mobility       PT Treatment Interventions DME instruction;Gait training;Stair training;Functional mobility training;Therapeutic exercise;Therapeutic activities    PT Goals (Current goals can be found in the Care Plan section)  Acute Rehab PT Goals Patient Stated Goal: get stronger PT Goal Formulation: With patient/family Time For Goal Achievement: 05/25/18 Potential to Achieve Goals: Fair    Frequency Min 3X/week   Barriers to discharge Decreased caregiver support      Co-evaluation PT/OT/SLP Co-Evaluation/Treatment: Yes Reason for Co-Treatment: For patient/therapist safety PT goals addressed during session: Mobility/safety with mobility;Balance;Proper use of DME;Strengthening/ROM         AM-PAC PT "6 Clicks" Daily Activity  Outcome Measure Difficulty turning over in bed (including adjusting bedclothes, sheets and blankets)?: Unable Difficulty moving from lying on back to sitting on the side of the bed? : Unable Difficulty sitting down on and standing up from a chair with arms (e.g., wheelchair, bedside commode, etc,.)?: Unable Help needed moving to and from a bed to chair (including a wheelchair)?: A Lot Help needed walking in hospital room?: A Lot Help needed climbing 3-5 steps with a railing? : Total 6 Click Score: 8    End of Session Equipment Utilized During Treatment: Gait belt Activity Tolerance: Patient tolerated treatment well Patient left: in chair;with call bell/phone within reach;with family/visitor present Nurse Communication: Mobility status PT Visit Diagnosis: Unsteadiness on feet (R26.81);Muscle weakness (generalized) (M62.81);Difficulty in walking, not elsewhere classified (R26.2)    Time: 1445-1530 PT Time Calculation (min) (ACUTE ONLY): 45  min   Charges:   PT Evaluation $PT Eval High Complexity: 1 High PT Treatments $Gait Training: 8-22 mins        Reinaldo Berber, PT, DPT Acute Rehab Services Pager: [phone]    Reinaldo Berber 05/18/2018, 3:49 PM

## 2018-05-18 NOTE — Progress Notes (Signed)
Progress Note  Patient Name: Tim Campbell Date of Encounter: 05/18/2018  Primary Cardiologist: Dr Smith/Dr Angelena Form  Subjective   Patient denies chest pain or dyspnea.  Inpatient Medications    Scheduled Meds: . amoxicillin  500 mg Oral Q8H  . aspirin EC  81 mg Oral Daily  . clopidogrel  75 mg Oral Daily  . [START ON 0/93/2355] folic acid  1 mg Oral Once per day on Mon Wed Fri  . levothyroxine  125 mcg Oral QAC breakfast  . metoprolol tartrate  12.5 mg Oral BID  . tamsulosin  0.4 mg Oral Daily   Continuous Infusions:  PRN Meds: acetaminophen **OR** acetaminophen, nitroGLYCERIN, ondansetron **OR** ondansetron (ZOFRAN) IV, oxyCODONE, tiZANidine   Vital Signs    Vitals:   05/17/18 2321 05/18/18 0405 05/18/18 0727 05/18/18 0908  BP: (!) 97/49 (!) 95/53 (!) 109/56 (!) 102/50  Pulse: 79 83 86 87  Resp: (!) 22 (!) 26 (!) 21 (!) 31  Temp: 97.8 F (36.6 C) 97.9 F (36.6 C) 98.2 F (36.8 C)   TempSrc: Oral Axillary Oral   SpO2: 100% 100% 97% 100%  Weight:  93.4 kg    Height:        Intake/Output Summary (Last 24 hours) at 05/18/2018 1016 Last data filed at 05/18/2018 0430 Gross per 24 hour  Intake 1324 ml  Output 1550 ml  Net -226 ml   Filed Weights   05/16/18 1425 05/17/18 0428 05/18/18 0405  Weight: 94.8 kg 90.6 kg 93.4 kg    Telemetry    Sinus with PACs, PVCs and brief NSVT - Personally Reviewed   Physical Exam   GEN: No acute distress.   Neck: No JVD Cardiac: RRR, 2/6 systolic murmur Respiratory: Clear to auscultation bilaterally. GI: Soft, nontender, non-distended  MS: trace edema Neuro:  Nonfocal  Psych: Normal affect   Labs    Chemistry Recent Labs  Lab 05/16/18 1226 05/16/18 1450 05/17/18 0601 05/18/18 0404  NA 135 135 138 136  K 3.7 3.3* 3.0* 3.8  CL 99 98 99 98  CO2 25 27 29 28   GLUCOSE 112* 132* 112* 128*  BUN 34* 36* 30* 28*  CREATININE 1.16 1.33* 1.38* 1.22  CALCIUM 8.0* 7.9* 7.8* 8.0*  PROT 5.6* 5.7* 4.7*  --   ALBUMIN  2.8* 2.9* 2.4*  --   AST 103* 101* 82*  --   ALT 46* 44 40  --   ALKPHOS 87 69 65  --   BILITOT 1.0 1.0 1.0  --   GFRNONAA 54* 46* 44* 51*  GFRAA >60 53* 51* 59*  ANIONGAP 11 10 10 10      Hematology Recent Labs  Lab 05/16/18 1226 05/16/18 1450 05/17/18 0601  WBC 5.3 5.7 5.4  RBC 4.27 4.38 4.09*  HGB 11.7* 12.0* 11.1*  HCT 35.4* 35.8* 34.1*  MCV 82.9 81.7 83.4  MCH 27.4* 27.4 27.1  MCHC 33.1 33.5 32.6  RDW 15.4 15.7* 15.5  PLT 78* 75* 56*    Cardiac Enzymes Recent Labs  Lab 05/16/18 1814 05/17/18 0006 05/17/18 0601 05/17/18 1145  TROPONINI 1.73* 1.57* 1.33* 1.30*    BNP Recent Labs  Lab 05/16/18 1450  BNP 855.2*     Radiology    Dg Chest 2 View  Result Date: 05/16/2018 CLINICAL DATA:  Acute lower extremity swelling. No fever. Cough and congestion. EXAM: CHEST - 2 VIEW COMPARISON:  12/17/2016 FINDINGS: There changes from previous cardiac surgery and aortic valve replacement. Cardiac silhouette is mildly enlarged. No  mediastinal or hilar masses. No evidence of adenopathy. Small pleural effusions. Prominent bronchovascular markings without overt pulmonary edema. No evidence of pneumonia. No pneumothorax. Skeletal structures are demineralized but grossly intact. IMPRESSION: 1. No acute cardiopulmonary disease. 2. Mild cardiomegaly with stable changes from prior cardiac surgery. 3. Small pleural effusions. Electronically Signed   By: Lajean Manes M.D.   On: 05/16/2018 17:35    Patient Profile     Tim Campbell is a 84 y.o. male with a history of CAD s/p 5V-CABG in 1993 with subsequent stenting to grafts (BMS to prox VG-PDA in 2012, BMS to SVG-OM in 03/2014), severe AS s/p TAVR in 06/2016 with 29 mm Sapien 3 valve, HFrEF with last known EF 40-45%, HTN, RBBB, HLD, hypothyroidism, large cell non-Hodgkins lympoma, BPH s/p thallium laser ablation of his prostate on 12/05/16 with indwelling catheter who presents from OSH with troponinemia in the setting of generalized  weakness, leg swelling and hypotension. Of note, has a recent MRI lumbar spine demonstrating multifocal bone lesions most consistent with metastatic carcinoma.   Assessment & Plan    1 elevated troponin-patient denies chest pain.  There is no clear trend.  Given overall medical condition would be conservative.  Plan medical therapy only.  2 cardiomyopathy-echocardiogram shows ejection fraction 30 to 35%.  We will continue with beta-blockade but changed to carvedilol 3.125 mg twice daily.  His blood pressure is borderline and I will therefore not add an ARB.  Titrate medications as tolerated.  Would not pursue aggressive cardiac evaluation.  Mild volume excess.  Add low-dose Lasix.  Follow renal function.  3 coronary artery disease-continue aspirin and resume statin.  4 MRI suggestive of metastatic carcinoma-further evaluation per primary care.  5 prior aortic valve replacement-stable on echocardiogram.  6 thrombocytopenia-we will continue aspirin 81 mg daily for coronary disease and prior aortic valve replacement but discontinue Plavix.  For questions or updates, please contact [email or web address] Please consult [email or web address] for contact info under Cardiology/STEMI.      Signed, Kirk Ruths, MD  05/18/2018, 10:16 AM

## 2018-05-19 ENCOUNTER — Inpatient Hospital Stay (HOSPITAL_COMMUNITY): Payer: Medicare Other

## 2018-05-19 ENCOUNTER — Encounter (HOSPITAL_COMMUNITY): Payer: Self-pay | Admitting: Radiology

## 2018-05-19 DIAGNOSIS — I959 Hypotension, unspecified: Secondary | ICD-10-CM

## 2018-05-19 DIAGNOSIS — Z952 Presence of prosthetic heart valve: Secondary | ICD-10-CM

## 2018-05-19 DIAGNOSIS — I5042 Chronic combined systolic (congestive) and diastolic (congestive) heart failure: Secondary | ICD-10-CM

## 2018-05-19 DIAGNOSIS — Z87891 Personal history of nicotine dependence: Secondary | ICD-10-CM

## 2018-05-19 DIAGNOSIS — Z79899 Other long term (current) drug therapy: Secondary | ICD-10-CM

## 2018-05-19 DIAGNOSIS — I5023 Acute on chronic systolic (congestive) heart failure: Secondary | ICD-10-CM

## 2018-05-19 DIAGNOSIS — D696 Thrombocytopenia, unspecified: Secondary | ICD-10-CM

## 2018-05-19 LAB — PROTEIN ELECTROPHORESIS, SERUM, WITH REFLEX
A/G RATIO SPE: 1 (ref 0.7–1.7)
Albumin ELP: 2.6 g/dL — ABNORMAL LOW (ref 2.9–4.4)
Alpha-1-Globulin: 0.3 g/dL (ref 0.0–0.4)
Alpha-2-Globulin: 0.9 g/dL (ref 0.4–1.0)
BETA GLOBULIN: 0.7 g/dL (ref 0.7–1.3)
GLOBULIN, TOTAL: 2.7 g/dL (ref 2.2–3.9)
Gamma Globulin: 0.8 g/dL (ref 0.4–1.8)
TOTAL PROTEIN ELP: 5.3 g/dL — AB (ref 6.0–8.5)

## 2018-05-19 LAB — CBC WITH DIFFERENTIAL/PLATELET
BASOS PCT: 0 %
Basophils Absolute: 0 10*3/uL (ref 0.0–0.1)
EOS PCT: 0 %
Eosinophils Absolute: 0 10*3/uL (ref 0.0–0.7)
HEMATOCRIT: 36.3 % — AB (ref 39.0–52.0)
Hemoglobin: 11.5 g/dL — ABNORMAL LOW (ref 13.0–17.0)
LYMPHS PCT: 18 %
Lymphs Abs: 1.5 10*3/uL (ref 0.7–4.0)
MCH: 26.6 pg (ref 26.0–34.0)
MCHC: 31.7 g/dL (ref 30.0–36.0)
MCV: 84 fL (ref 78.0–100.0)
MONO ABS: 0.7 10*3/uL (ref 0.1–1.0)
Monocytes Relative: 8 %
NEUTROS PCT: 74 %
Neutro Abs: 6.2 10*3/uL (ref 1.7–7.7)
PLATELETS: 54 10*3/uL — AB (ref 150–400)
RBC: 4.32 MIL/uL (ref 4.22–5.81)
RDW: 15.5 % (ref 11.5–15.5)
WBC: 8.4 10*3/uL (ref 4.0–10.5)

## 2018-05-19 LAB — CBC
HEMATOCRIT: 36.6 % — AB (ref 39.0–52.0)
HEMOGLOBIN: 11.7 g/dL — AB (ref 13.0–17.0)
MCH: 26.7 pg (ref 26.0–34.0)
MCHC: 32 g/dL (ref 30.0–36.0)
MCV: 83.6 fL (ref 78.0–100.0)
Platelets: 58 10*3/uL — ABNORMAL LOW (ref 150–400)
RBC: 4.38 MIL/uL (ref 4.22–5.81)
RDW: 15.4 % (ref 11.5–15.5)
WBC: 8.4 10*3/uL (ref 4.0–10.5)

## 2018-05-19 LAB — BASIC METABOLIC PANEL
ANION GAP: 7 (ref 5–15)
BUN: 26 mg/dL — ABNORMAL HIGH (ref 8–23)
CHLORIDE: 99 mmol/L (ref 98–111)
CO2: 28 mmol/L (ref 22–32)
Calcium: 8 mg/dL — ABNORMAL LOW (ref 8.9–10.3)
Creatinine, Ser: 1.05 mg/dL (ref 0.61–1.24)
GFR calc Af Amer: >60 mL/min (ref >60)
GFR calc non Af Amer: >60 mL/min (ref >60)
GLUCOSE: 125 mg/dL — AB (ref 70–99)
POTASSIUM: 3.8 mmol/L (ref 3.5–5.1)
Sodium: 134 mmol/L — ABNORMAL LOW (ref 135–145)

## 2018-05-19 LAB — MAGNESIUM: Magnesium: 1.7 mg/dL (ref 1.7–2.4)

## 2018-05-19 LAB — LACTATE DEHYDROGENASE: LDH: 223 U/L — ABNORMAL HIGH (ref 98–192)

## 2018-05-19 LAB — KAPPA/LAMBDA LIGHT CHAINS
KAPPA, LAMDA LIGHT CHAIN RATIO: 1.47 (ref 0.26–1.65)
Kappa free light chain: 25.8 mg/L — ABNORMAL HIGH (ref 3.3–19.4)
Lambda free light chains: 17.5 mg/L (ref 5.7–26.3)

## 2018-05-19 LAB — PROTIME-INR
INR: 1.23
Prothrombin Time: 15.4 seconds — ABNORMAL HIGH (ref 11.4–15.2)

## 2018-05-19 LAB — GLUCOSE, CAPILLARY: Glucose-Capillary: 150 mg/dL — ABNORMAL HIGH (ref 70–99)

## 2018-05-19 MED ORDER — ATORVASTATIN CALCIUM 40 MG PO TABS
40.0000 mg | ORAL_TABLET | Freq: Every day | ORAL | Status: DC
  Administered 2018-05-19 – 2018-05-22 (×4): 40 mg via ORAL
  Filled 2018-05-19 (×4): qty 1

## 2018-05-19 MED ORDER — DRONABINOL 2.5 MG PO CAPS
2.5000 mg | ORAL_CAPSULE | Freq: Three times a day (TID) | ORAL | Status: DC
  Administered 2018-05-19 – 2018-05-23 (×13): 2.5 mg via ORAL
  Filled 2018-05-19 (×13): qty 1

## 2018-05-19 MED ORDER — PNEUMOCOCCAL VAC POLYVALENT 25 MCG/0.5ML IJ INJ: 0.5000 mL | INJECTION | INTRAMUSCULAR | Status: DC

## 2018-05-19 MED ORDER — AMIODARONE LOAD VIA INFUSION
150.0000 mg | Freq: Once | INTRAVENOUS | Status: DC
  Filled 2018-05-19: qty 83.34

## 2018-05-19 MED ORDER — AMIODARONE HCL IN DEXTROSE 360-4.14 MG/200ML-% IV SOLN
60.0000 mg/h | INTRAVENOUS | Status: DC
  Administered 2018-05-19 – 2018-05-20 (×2): 60 mg/h via INTRAVENOUS
  Filled 2018-05-19: qty 200

## 2018-05-19 MED ORDER — IOPAMIDOL (ISOVUE-300) INJECTION 61%
INTRAVENOUS | Status: AC
  Filled 2018-05-19: qty 30

## 2018-05-19 MED ORDER — AMIODARONE HCL IN DEXTROSE 360-4.14 MG/200ML-% IV SOLN
INTRAVENOUS | Status: AC
  Filled 2018-05-19: qty 200

## 2018-05-19 MED ORDER — IOHEXOL 300 MG/ML  SOLN: 15.0000 mL | INTRAMUSCULAR | Status: AC

## 2018-05-19 MED ORDER — IOHEXOL 300 MG/ML  SOLN
100.0000 mL | Freq: Once | INTRAMUSCULAR | Status: AC | PRN
  Administered 2018-05-19: 100 mL via INTRAVENOUS

## 2018-05-19 MED ORDER — MAGNESIUM SULFATE 4 GM/100ML IV SOLN
4.0000 g | Freq: Once | INTRAVENOUS | Status: AC
  Administered 2018-05-19: 4 g via INTRAVENOUS
  Filled 2018-05-19: qty 100

## 2018-05-19 MED ORDER — AMIODARONE HCL IN DEXTROSE 360-4.14 MG/200ML-% IV SOLN
30.0000 mg/h | INTRAVENOUS | Status: DC
  Administered 2018-05-20: 30 mg/h via INTRAVENOUS
  Filled 2018-05-19: qty 200

## 2018-05-19 MED ORDER — ACETAMINOPHEN 500 MG PO TABS
500.0000 mg | ORAL_TABLET | Freq: Three times a day (TID) | ORAL | Status: DC
  Administered 2018-05-19 – 2018-05-23 (×12): 500 mg via ORAL
  Filled 2018-05-19 (×13): qty 1

## 2018-05-19 NOTE — Progress Notes (Signed)
Chief Complaint: Patient was seen in consultation today for bone marrow biopsy at the request of Dr. Marin Olp  Referring Physician(s): Dr. Marin Olp  Supervising Physician: Corrie Mckusick  Patient Status: [name] - In-pt  History of Present Illness: Tim Campbell is a 84 y.o. male admitted with hypotension and elevated cardiac enzymes. He does have hx of CAD and cardiomyopathy. He has been cleared by Cardiology, however he is also found to have thrombocytopenia. He has a remote hx of lymphoma. Dr. Marin Olp was consulted and has requested bone marrow biopsy. PMHx, meds, labs, imaging, allergies reviewed. Feels fairly well, no recent fevers, chills, illness. Family at bedside.   Past Medical History:  Diagnosis Date  . AAA (abdominal aortic aneurysm) ([place])    a. 3.3x3.0cm by CT 05/2016.  Marland Kitchen BPH (benign prostatic hyperplasia)   . Chronic combined systolic and diastolic HF (heart failure), NYHA class 3   . Coronary artery disease CARDIOLOGIST-  DR Angelena Form   a. s/p CABGx5 in 1993. b. NSTEMI with occluded VG-diag in 2005. c. BMS to prox VG-PDA 2012. d. BMS to SVG-OM 03/2014. e. stable cath 05/2016.  . Diverticulosis of colon   . Heart murmur   . History of colon polyps    2002;  2007  . History of non-ST elevation myocardial infarction (NSTEMI)    x2 --- 12-29-2003  and  01-01-2013  . Hypertension   . Hypothyroidism   . Lower urinary tract symptoms (LUTS)   . Mixed hyperlipidemia   . Non Hodgkin's lymphoma ([place]) dx 2006 bone marrow bx--- onocologist-  dr Marin Olp   s/p  chemo and radiation therpay--- in remission since ---  . OA (osteoarthritis)   . Peripheral neuropathy   . Pulmonary nodules    a. by CT 05/2016  . RBBB (right bundle branch block)   . S/p bare metal coronary artery stent    10-16-2010  BMS to RCA graft  . S/P CABG x 5    05/ 1993  . 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 Urinary retention   . Wears dentures    upper  . Wears glasses      Past Surgical History:  Procedure Laterality Date  . CARDIAC CATHETERIZATION N/A 05/17/2016   Procedure: Right/Left Heart Cath and Coronary/Graft Angiography;  Surgeon: Nelva Bush, MD;  Location: [place] [facility];  Service: Cardiovascular;  Laterality: N/A;  severe CAD,  total occlusion of mLAD, dLCx, and mRCA, 99% ostial LCX/ wide patent LIMA to LAD & SVG to OM, patent SVG to PDA/PL w/ stable 50% ISR at ostium SVG/ chronic occluded SVG to Diagonal/ severe AVS  . CARDIAC CATHETERIZATION  12-30-2003  dr Leonia Reeves   severe 3v cad:  occlused SVG to diagonal causing NSTEMI,  ef 45%  . CATARACT EXTRACTION W/ INTRAOCULAR LENS  IMPLANT, BILATERAL    . COLONOSCOPY    . CORONARY ANGIOPLASTY WITH STENT PLACEMENT  10-20-2010  dr Daneen Schick   BMS to ostium of the RCA graft:  total occlusion LAD, CFX, and RCA,  bypass graft 's-- total occlusion SVG to diagonal,  high-grade obstruction prox.Alfredo Batty segment of RCA graft,  mid SVG to OM 50% otherwise patent/  mild AVS inferior wall hypokinesis  . CORONARY ARTERY BYPASS GRAFT  05/ 1993     dr gerhart   LIMA to LAD,  SVG to D1,  SVT to RCA,  SVG to OM,  SVG to PDA  . [place]  . I&D EXTREMITY Left 03/09/2016  Procedure: IRRIGATION AND DEBRIDEMENT WRIST;  Surgeon: Milly Jakob, MD;  Location: [place];  Service: Orthopedics;  Laterality: Left;  . INGUINAL HERNIA REPAIR Right 02/21/2006  . KNEE ARTHROSCOPY Right 1970'S   . LEFT AND RIGHT HEART CATHETERIZATION WITH CORONARY/GRAFT ANGIOGRAM N/A 04/05/2014   Procedure: LEFT AND RIGHT HEART CATHETERIZATION WITH [name];  Surgeon: Burnell Blanks, MD;  Location: [facility] CATH LAB;  Service: Cardiovascular;  Laterality: N/A;  . LEFT HEART CATHETERIZATION WITH CORONARY/GRAFT ANGIOGRAM N/A 01/02/2013   Procedure: LEFT HEART CATHETERIZATION WITH [name];  Surgeon: [name], MD;  Location: [facility] CATH LAB;  Service: Cardiovascular;  Laterality:  N/A;NSTEMI:side-to-side anastomosis w/the PDA is threatened(not amenable to PCI)in-stent restenosis  RCA graft,  50% osyial SVG to OM , LIMA to LAD patent w/ diffuse distal LAD disease/  severe 3V CAD w/ occlusion LAD, CFX, RCA, ef 45%  . OPEN BX LEFT CERVICAL MASS, POSTERIOR NECK  10/03/2004  . OPEN REDUCTION INTERNAL FIXATION (ORIF) DISTAL RADIAL FRACTURE Left 02/24/2016   Procedure: OPEN TREATMENT OF LEFT DISTAL RADIUS FRACTURE;  Surgeon: Milly Jakob, MD;  Location: [place];  Service: Orthopedics;  Laterality: Left;  . ORIF RIGHT ANKLE FX  10/07/2008  . PERCUTANEOUS CORONARY STENT INTERVENTION (PCI-S) N/A 04/21/2014   Procedure: PERCUTANEOUS CORONARY STENT INTERVENTION (PCI-S);  Surgeon: [name], MD;  Location: [facility] CATH LAB;  Service: Cardiovascular;  Laterality: N/A;   BMS in the ostial/proximal SVG to CFX,  stable 50-70% ostial SVG to RCA (previous stenting)  . TEE WITHOUT CARDIOVERSION N/A 06/26/2016   Procedure: TRANSESOPHAGEAL ECHOCARDIOGRAM (TEE);  Surgeon: Burnell Blanks, MD;  Location: [place];  Service: Open Heart Surgery;  Laterality: N/A;  . THULIUM LASER TURP (TRANSURETHRAL RESECTION OF PROSTATE) N/A 12/05/2016   Procedure: THULIUM LASER TURP (TRANSURETHRAL RESECTION OF PROSTATE);  Surgeon: Nickie Retort, MD;  Location: [facility];  Service: Urology;  Laterality: N/A;  . TRANSCATHETER AORTIC VALVE REPLACEMENT, TRANSFEMORAL Bilateral 06/26/2016   Procedure: TRANSCATHETER AORTIC VALVE REPLACEMENT, TRANSFEMORAL;  Surgeon: Burnell Blanks, MD;  Location: [place];  Service: Open Heart Surgery;  Laterality: Bilateral;  . TRANSTHORACIC ECHOCARDIOGRAM  08/03/2016    post TAVR    ef 40-45%,  akinesis and scarring of the basal-inferolateral, inferior, and inferoseptal (consistent w/ infarction in distribution of the RCA), severe hypokinesis of the basal anteroseptal (consistent w/ ischemia in distribution of the LAD but w/ patent distal graft),  grade 2 diastolic dysfunction/  AV  stent valve bioprosthesis normal function w/ trivial regurg (valve area 1.16cm^2)/  . TRANSTHORACIC ECHOCARDIOGRAM  08-03-2016  continued results- post TAVR   mild dilated ascending aorta/  mild to moderate MR, peak grandient 72mHg/  severe LAE and mild RAE/  mild PR/  trivial TR    Allergies: Patient has no known allergies.  Medications: Prior to Admission medications   Medication Sig Start Date End Date Taking? Authorizing Provider  acetaminophen (TYLENOL) 500 MG tablet Take 500 mg by mouth as needed (knee pain).    Yes [provider]  aspirin EC 81 MG tablet Take 81 mg by mouth daily.    Yes [provider]  atorvastatin (LIPITOR) 40 MG tablet TAKE 1 TABLET (40 MG TOTAL) BY MOUTH DAILY AT 6 PM. PLEASE SCHEDULE ANNUAL APPOINTMENT THANKS. Patient taking differently: Take 40 mg by mouth daily at 6 PM.  04/11/18  Yes SBelva Crome MD  B Complex-C (B-COMPLEX WITH VITAMIN C) tablet Take 1 tablet by mouth 3 (three) times a week.  Yes [provider]  clopidogrel (PLAVIX) 75 MG tablet TAKE 1 TABLET BY MOUTH EVERY DAY Patient taking differently: Take 75 mg by mouth daily.  02/21/18  Yes Belva Crome, MD  GARLIC PO Take 1 tablet by mouth daily.    Yes [provider]  levothyroxine (SYNTHROID, LEVOTHROID) 125 MCG tablet Take 125 mcg by mouth daily before breakfast.  11/23/14  Yes [provider]  Magnesium 200 MG TABS Take 225 mg by mouth every other day.    Yes [provider]  metoprolol tartrate (LOPRESSOR) 25 MG tablet Take 0.5 tablets (12.5 mg total) by mouth 2 (two) times daily. 09/18/17  Yes Belva Crome, MD  Multiple Vitamins-Iron (CHLORELLA PO) Take 6 tablets by mouth 2 (two) times daily. MED NAME: CHLORELLA    Yes [provider]  nitroGLYCERIN (NITROSTAT) 0.4 MG SL tablet Place 1 tablet (0.4 mg total) under the tongue every 5 (five) minutes as needed for chest pain. 01/22/17  Yes Belva Crome, MD  tamsulosin (FLOMAX) 0.4  MG CAPS capsule Take 0.4 mg by mouth 2 (two) times daily.  05/01/16  Yes [provider]  tiZANidine (ZANAFLEX) 2 MG tablet Take 2 mg by mouth every 6 (six) hours as needed (rest).  05/13/18  Yes [provider]  vitamin E 100 UNIT capsule Take 100 Units by mouth 3 (three) times a week.    Yes [provider]     Family History  Problem Relation Age of Onset  . Colon cancer Father   . Stroke Mother   . CAD Brother   . Stroke Sister   . Bone cancer Brother   . Parkinson's disease Brother   . Dementia Sister   . Stroke Sister     Social History   Socioeconomic History  . Marital status: Married    Spouse name: Not on file  . Number of children: 3  . Years of education: Not on file  . Highest education level: Not on file  Occupational History  . 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 Financial resource strain: Not on file  . Food insecurity:    Worry: Not on file    Inability: Not on file  . Transportation needs:    Medical: Not on file    Non-medical: Not on file  Tobacco Use  . Smoking status: Former Smoker    Packs/day: 0.50    Years: 15.00    Pack years: 7.50    Types: Cigarettes    Last attempt to quit: 06/22/1961    Years since quitting: 56.9  . Smokeless tobacco: Never Used  Substance and Sexual Activity  . Alcohol use: No  . Drug use: No  . Sexual activity: Not on file  Lifestyle  . Physical activity:    Days per week: Not on file    Minutes per session: Not on file  . Stress: Not on file  Relationships  . Social connections:    Talks on phone: Not on file    Gets together: Not on file    Attends religious service: Not on file    Active member of club or organization: Not on file    Attends meetings of clubs or organizations: Not on file    Relationship status: Not on file  Other Topics Concern  . Not on file  Social History Narrative  . Not on file     Review of Systems: A  12 point ROS discussed and pertinent positives are indicated in the HPI above.  All other systems are negative.  Review of Systems  Vital Signs: BP 106/63 (BP Location: Left Arm)   Pulse 80   Temp 97.9 F (36.6 C) (Oral)   Resp (!) 30   Ht _0  (1.778 m)   Wt 93 kg   SpO2 96%   BMI 29.43 kg/m   Physical Exam  Constitutional: He is oriented to person, place, and time. He appears well-developed. No distress.  HENT:  Head: Normocephalic.  Mouth/Throat: Oropharynx is clear and moist.  Neck: Normal range of motion. No JVD present. No tracheal deviation present.  Cardiovascular: Normal rate, regular rhythm and normal heart sounds.  Pulmonary/Chest: Effort normal and breath sounds normal. No respiratory distress.  Neurological: He is alert and oriented to person, place, and time.  Skin: Skin is warm and dry.  Psychiatric: He has a normal mood and affect.    Imaging: Dg Chest 2 View  Result Date: 05/16/2018 CLINICAL DATA:  Acute lower extremity swelling. No fever. Cough and congestion. EXAM: CHEST - 2 VIEW COMPARISON:  12/17/2016 FINDINGS: There changes from previous cardiac surgery and aortic valve replacement. Cardiac silhouette is mildly enlarged. No mediastinal or hilar masses. No evidence of adenopathy. Small pleural effusions. Prominent bronchovascular markings without overt pulmonary edema. No evidence of pneumonia. No pneumothorax. Skeletal structures are demineralized but grossly intact. IMPRESSION: 1. No acute cardiopulmonary disease. 2. Mild cardiomegaly with stable changes from prior cardiac surgery. 3. Small pleural effusions. Electronically Signed   By: Lajean Manes M.D.   On: 05/16/2018 17:35   Ct Head Wo Contrast  Result Date: 05/18/2018 CLINICAL DATA:  Metastatic spine tumor suspected EXAM: CT HEAD WITHOUT CONTRAST TECHNIQUE: Contiguous axial images were obtained from the base of the skull through the vertex without intravenous contrast. COMPARISON:  Brain MRI  03/02/2005 FINDINGS: Brain: Bilateral inferior frontal gliosis with pattern suggesting remote contusion, stable since 2006 at least. Partly calcified far anterior right parafalcine mass measuring 13 mm. In retrospect there was meningioma in this location measuring 6 mm in 2006. Mild for age cerebral volume loss. No evidence of acute infarct, hemorrhage, hydrocephalus, or collection. Vascular: Atherosclerotic calcification.  No hyperdense vessel Skull: No acute finding. Sinuses/Orbits: Bilateral cataract resection. IMPRESSION: 1. No acute finding. 2. 13 mm anterior falcine meningioma with mild growth since 2006. 3. Remote bilateral inferior frontal contusion. Electronically Signed   By: Monte Fantasia M.D.   On: 05/18/2018 12:24   Ct Chest W Contrast  Result Date: 05/19/2018 CLINICAL DATA:  Thrombocytopenia.  Past history of lymphoma. EXAM: CT CHEST, ABDOMEN, AND PELVIS WITH CONTRAST TECHNIQUE: Multidetector CT imaging of the chest, abdomen and pelvis was performed following the standard protocol during bolus administration of intravenous contrast. CONTRAST:  19m OMNIPAQUE IOHEXOL 300 MG/ML  SOLN COMPARISON:  Multiple exams, including chest CT from 01/04/2017 and abdomen CT from 06/06/2016 FINDINGS: CT CHEST FINDINGS Cardiovascular: Coronary, aortic arch, and branch vessel atherosclerotic vascular disease. Aortic valve prosthesis. Prior CABG. Mild cardiomegaly. Mediastinum/Nodes: Small mediastinal lymph nodes are not pathologically enlarged by size criteria. Lungs/Pleura: Moderate bilateral pleural effusions, nonspecific for transudative or exudative etiology. Associated passive atelectasis noted. The previous left lower lobe pulmonary nodule referenced on the exam from 01/04/2017 is within atelectatic lung and thus completely obscured if still present. Musculoskeletal: Prominent degenerative right glenohumeral arthropathy. Multiple healed bilateral rib fractures. There are new bilateral sclerotic rib lesions  of considerable suspicion for malignancy, including a 5.3 cm  long area of sclerosis in the left seventh rib anteriorly, among others. Thoracic spondylosis with multilevel bridging spurring anteriorly. CT ABDOMEN PELVIS FINDINGS Hepatobiliary: Mildly contracted gallbladder. There is potentially mild gallbladder wall thickening. No focal hepatic parenchymal lesion is identified. Pancreas: Unremarkable Spleen: Faintly heterogeneous enhancement of the spleen on the later arterial phase images. The spleen measures proximally on 0.9 by 6.6 by 10.1 cm. Delayed images include the lower half of the spleen and appear relatively homogeneous. Adrenals/Urinary Tract: Mild left and borderline right hydronephrosis with moderate bilateral hydroureter extending down to the urinary bladder. Notable wall thickening in the urinary bladder favoring cystitis. A Foley catheter is present with balloon inflated in the urinary bladder. Right renal cysts noted. There is a linear 5 mm left kidney lower pole nonobstructive renal calculus on image 87/6. Mild scarring of the left kidney. Stomach/Bowel: Proximal sigmoid colon diverticulosis. Vascular/Lymphatic: Aortoiliac atherosclerotic vascular disease. Infrarenal abdominal aortic aneurysm 3.4 cm in diameter, formerly 3.3 Cm. Rim calcified structure in the right abdomen just medial to the SVC on image 57/3, probably a calcified lymph node and less likely to be a small thrombosed aneurysm. Reproductive: Mild prostatomegaly. Other: Increase in diffuse mesenteric and perirenal stranding along with trace fluid in both paracolic gutters. Presacral edema. There is notable edema surrounding the urinary bladder wall. There is also subcutaneous edema along the pubis. Mild flank edema bilaterally. Musculoskeletal: New 1.5 cm sclerotic lesion eccentric to the right in S1 level of the sacrum. New sclerotic lesions in the left ilium in ischium, in the right supra-acetabular region, and in the right ischium.  New sclerotic lesion in the left intertrochanteric region measuring about 1.8 cm in diameter. IMPRESSION: 1. Scattered new sclerotic lesions in the skeleton especially in the ribs and bony pelvis, suspicious for malignancy. 2. Notable third spacing of fluid with moderate bilateral pleural effusions, flank edema, mesenteric and presacral edema, and a small amount of fluid in the paracolic gutters. 3. Atelectasis obscures the region of the prior left lower lobe pulmonary nodule. Size of this nodule if it is still present at all unknown. 4. Notably thickened urinary bladder, suspicious for cystitis. Moderate dilation of both ureters extending towards the urinary bladder without obstructive stone seen. 5. 3.4 cm infrarenal abdominal aortic aneurysm. Recommend followup by [place] in 3 years. This recommendation follows ACR consensus guidelines: White Paper of the ACR Incidental Findings Committee II on Vascular Findings. J Am Coll Radiol 2013; 10:789-794. Aortic aneurysm NOS (ICD10-I71.9). 6. Other imaging findings of potential clinical significance: Aortic Atherosclerosis (ICD10-I70.0). Coronary atherosclerosis. Aortic valve prosthesis. Prior CABG. Mild cardiomegaly. Prominent right glenohumeral arthropathy. Multiple old healed rib fractures. Questionable mild gallbladder wall thickening. Nonobstructive left nephrolithiasis. Proximal sigmoid colon diverticulosis. Mild prostatomegaly. Electronically Signed   By: Van Clines M.D.   On: 05/19/2018 13:35   Mr Lumbar Spine Wo Contrast  Result Date: 05/11/2018 CLINICAL DATA:  Back pain. Left leg and hip pain over the last 3 weeks. Distant history of lymphoma. EXAM: MRI LUMBAR SPINE WITHOUT CONTRAST TECHNIQUE: Multiplanar, multisequence MR imaging of the lumbar spine was performed. No intravenous contrast was administered. COMPARISON:  CT 06/06/2016. FINDINGS: Segmentation:  5 lumbar type vertebral bodies. Alignment:  No significant malalignment. Vertebrae: Multiple  marrow space lesions scattered throughout the region consistent with metastatic carcinoma or possibly multifocal lymphoma. There are foci of involvement at T11, T12, L1, L2, L3, L4 and the sacrum. Both iliac bones are involved. No extraosseous tumor is seen. No tumor encroachment upon the spinal canal or neural  foramina. Conus medullaris and cauda equina: Conus extends to the L1 level. Conus and cauda equina appear normal. Paraspinal and other soft tissues: Distended bladder with trabeculation. Bilateral hydroureteronephrosis, most likely secondary to chronic bladder outlet obstruction. Mild aneurysmal dilatation of the infrarenal abdominal aorta. Maximal diameter 3.3 cm. Disc levels: T12-L1: Mild bulging of the disc.  No compressive stenosis. L1-2: Normal disc space. L2-3: Endplate osteophytes and bulging of the disc. Mild stenosis of both lateral recesses but no definite neural compression. L3-4: Endplate osteophytes and bulging of the disc. Mild stenosis of the lateral recesses. Some potential for neural compression particularly on the right. L4-5: Endplate osteophytes and bulging of the disc. Facet and ligamentous hypertrophy. Stenosis of the lateral recesses right more than left. Some potential for neural compression on the right. L5-S1: Endplate osteophytes and bulging of the disc. No central canal stenosis. No compressive foraminal stenosis. IMPRESSION: Multifocal bone lesions scattered throughout the lower thoracic spine, sacrum and iliac bones most consistent with metastatic carcinoma. Multifocal lymphoma can have this appearance. No pathologic fracture. No extraosseous tumor or apparent effect upon the neural structures. Distended bladder. Bilateral hydroureteronephrosis. Findings suggest chronic bladder outlet obstruction. Infrarenal abdominal aortic aneurysm maximal diameter 3.3 cm. Recommend followup by ultrasound in 3 years. This recommendation follows ACR consensus guidelines: White Paper of the ACR  Incidental Findings Committee II on Vascular Findings. 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 No compressive stenosis seen to explain left leg symptoms. Mild right more than left lateral recess stenosis at L3-4 and L4-5 that could possibly cause right-sided symptoms. These results will be called to the ordering clinician or representative by the Radiologist Assistant, and communication documented in the PACS or zVision Dashboard. Routine call during normal Monday hours is planned. Electronically Signed   By: Nelson Chimes M.D.   On: 05/11/2018 14:23   Ct Abdomen Pelvis W Contrast  Result Date: 05/19/2018 CLINICAL DATA:  Thrombocytopenia.  Past history of lymphoma. EXAM: CT CHEST, ABDOMEN, AND PELVIS WITH CONTRAST TECHNIQUE: Multidetector CT imaging of the chest, abdomen and pelvis was performed following the standard protocol during bolus administration of intravenous contrast. CONTRAST:  152m OMNIPAQUE IOHEXOL 300 MG/ML  SOLN COMPARISON:  Multiple exams, including chest CT from 01/04/2017 and abdomen CT from 06/06/2016 FINDINGS: CT CHEST FINDINGS Cardiovascular: Coronary, aortic arch, and branch vessel atherosclerotic vascular disease. Aortic valve prosthesis. Prior CABG. Mild cardiomegaly. Mediastinum/Nodes: Small mediastinal lymph nodes are not pathologically enlarged by size criteria. Lungs/Pleura: Moderate bilateral pleural effusions, nonspecific for transudative or exudative etiology. Associated passive atelectasis noted. The previous left lower lobe pulmonary nodule referenced on the exam from 01/04/2017 is within atelectatic lung and thus completely obscured if still present. Musculoskeletal: Prominent degenerative right glenohumeral arthropathy. Multiple healed bilateral rib fractures. There are new bilateral sclerotic rib lesions of considerable suspicion for malignancy, including a 5.3 cm long area of sclerosis in the  left seventh rib anteriorly, among others. Thoracic spondylosis with multilevel bridging spurring anteriorly. CT ABDOMEN PELVIS FINDINGS Hepatobiliary: Mildly contracted gallbladder. There is potentially mild gallbladder wall thickening. No focal hepatic parenchymal lesion is identified. Pancreas: Unremarkable Spleen: Faintly heterogeneous enhancement of the spleen on the later arterial phase images. The spleen measures proximally on 0.9 by 6.6 by 10.1 cm. Delayed images include the lower half of the spleen and appear relatively homogeneous. Adrenals/Urinary Tract: Mild left and borderline right hydronephrosis with moderate bilateral hydroureter extending down to the urinary bladder. Notable wall thickening in the  urinary bladder favoring cystitis. A Foley catheter is present with balloon inflated in the urinary bladder. Right renal cysts noted. There is a linear 5 mm left kidney lower pole nonobstructive renal calculus on image 87/6. Mild scarring of the left kidney. Stomach/Bowel: Proximal sigmoid colon diverticulosis. Vascular/Lymphatic: Aortoiliac atherosclerotic vascular disease. Infrarenal abdominal aortic aneurysm 3.4 cm in diameter, formerly 3.3 Cm. Rim calcified structure in the right abdomen just medial to the SVC on image 57/3, probably a calcified lymph node and less likely to be a small thrombosed aneurysm. Reproductive: Mild prostatomegaly. Other: Increase in diffuse mesenteric and perirenal stranding along with trace fluid in both paracolic gutters. Presacral edema. There is notable edema surrounding the urinary bladder wall. There is also subcutaneous edema along the pubis. Mild flank edema bilaterally. Musculoskeletal: New 1.5 cm sclerotic lesion eccentric to the right in S1 level of the sacrum. New sclerotic lesions in the left ilium in ischium, in the right supra-acetabular region, and in the right ischium. New sclerotic lesion in the left intertrochanteric region measuring about 1.8 cm in  diameter. IMPRESSION: 1. Scattered new sclerotic lesions in the skeleton especially in the ribs and bony pelvis, suspicious for malignancy. 2. Notable third spacing of fluid with moderate bilateral pleural effusions, flank edema, mesenteric and presacral edema, and a small amount of fluid in the paracolic gutters. 3. Atelectasis obscures the region of the prior left lower lobe pulmonary nodule. Size of this nodule if it is still present at all unknown. 4. Notably thickened urinary bladder, suspicious for cystitis. Moderate dilation of both ureters extending towards the urinary bladder without obstructive stone seen. 5. 3.4 cm infrarenal abdominal aortic aneurysm. Recommend followup by [place] in 3 years. This recommendation follows ACR consensus guidelines: White Paper of the ACR Incidental Findings Committee II on Vascular Findings. J Am Coll Radiol 2013; 10:789-794. Aortic aneurysm NOS (ICD10-I71.9). 6. Other imaging findings of potential clinical significance: Aortic Atherosclerosis (ICD10-I70.0). Coronary atherosclerosis. Aortic valve prosthesis. Prior CABG. Mild cardiomegaly. Prominent right glenohumeral arthropathy. Multiple old healed rib fractures. Questionable mild gallbladder wall thickening. Nonobstructive left nephrolithiasis. Proximal sigmoid colon diverticulosis. Mild prostatomegaly. Electronically Signed   By: Van Clines M.D.   On: 05/19/2018 13:35    Labs:  CBC: Recent Labs    05/16/18 1450 05/17/18 0601 05/19/18 0345 05/19/18 0821  WBC 5.7 5.4 8.4 8.4  HGB 12.0* 11.1* 11.7* 11.5*  HCT 35.8* 34.1* 36.6* 36.3*  PLT 75* 56* 58* 54*    COAGS: Recent Labs    05/19/18 0821  INR 1.23    BMP: Recent Labs    05/16/18 1450 05/17/18 0601 05/18/18 0404 05/19/18 0345  NA 135 138 136 134*  K 3.3* 3.0* 3.8 3.8  CL 98 99 98 99  CO2 _0 GLUCOSE 132* 112* 128* 125*  BUN 36* 30* 28* 26*  CALCIUM 7.9* 7.8* 8.0* 8.0*  CREATININE 1.33* 1.38* 1.22 1.05  GFRNONAA 46* 44*  51* >60  GFRAA 53* 51* 59* >60    LIVER FUNCTION TESTS: Recent Labs    05/16/18 1226 05/16/18 1450 05/17/18 0601  BILITOT 1.0 1.0 1.0  AST 103* 101* 82*  ALT 46* 44 40  ALKPHOS 87 69 65  PROT 5.6* 5.7* 4.7*  ALBUMIN 2.8* 2.9* 2.4*    TUMOR MARKERS: No results for input(s): AFPTM, CEA, CA199, CHROMGRNA in the last 8760 hours.  Assessment and Plan: Thrombocytopenia with hx of Lymphoma Underlying CAD and cardiomyopathy, stable per Cardiology Labs reviewed. Plan for CT guided bone marrow  biopsy tomorrow. Risks and benefits discussed with the patient including, but not limited to bleeding, infection, damage to adjacent structures or low yield requiring additional tests.  All of the patient's questions were answered, patient is agreeable to proceed. Consent signed and in chart.    Thank you for this interesting consult.  I greatly enjoyed meeting Tim Campbell and look forward to participating in their care.  A copy of this report was sent to the requesting provider on this date.  Electronically Signed: Ascencion Dike, PA-C 05/19/2018, 2:39 PM   I spent a total of 20 minutes in face to face in clinical consultation, greater than 50% of which was counseling/coordinating care for bone marrow biopsy

## 2018-05-19 NOTE — Clinical Social Work Note (Signed)
Clinical Social Work Assessment  Patient Details  Name: Tim Campbell MRN: [id number] Date of Birth: 03-Sep-1926  Date of referral:  05/19/18               Reason for consult:  Facility Placement, Discharge Planning                Permission sought to share information with:  Facility Sport and exercise psychologist, Family Supports Permission granted to share information::  Yes, Verbal Permission Granted  Name::     Tim Campbell  Agency::  SNFs  Relationship::  spouse  Contact Information:  [phone]  Housing/Transportation Living arrangements for the past 2 months:  Single Family Home Source of Information:  Adult Children, Spouse Patient Interpreter Needed:  None Criminal Activity/Legal Involvement Pertinent to Current Situation/Hospitalization:  No - Comment as needed Significant Relationships:  Adult Children, Spouse Lives with:  Spouse Do you feel safe going back to the place where you live?  Yes Need for family participation in patient care:  Yes (Comment)  Care giving concerns: Patient from home with spouse. PT recommending SNF.    Social Worker assessment / plan: CSW met with patient, spouse, and daughter at bedside. Patient sleeping through most of assessment, but awoke as CSW leaving. Daughter and spouse agreeable to SNF, though wondering about when patient will be discharged from the hospital, due to pending biopsy. CSW faxed out initial referrals. Will provide offers when available and follow to support with discharge planning.  Employment status:  Retired Forensic scientist:  Medicare PT Recommendations:  [place] / Referral to community resources:  [place]  Patient/Family's Response to care: Patient and family appreciative of care.  Patient/Family's Understanding of and Emotional Response to Diagnosis, Current Treatment, and Prognosis: Family with understanding of condition and agreeable to SNF.  Emotional  Assessment Appearance:  Appears stated age Attitude/Demeanor/Rapport:  Lethargic, Engaged Affect (typically observed):  Accepting, Calm Orientation:  Oriented to Self, Oriented to Place, Oriented to  Time, Oriented to Situation Alcohol / Substance use:  Not Applicable Psych involvement (Current and /or in the community):  No (Comment)  Discharge Needs  Concerns to be addressed:  Discharge Planning Concerns, Care Coordination Readmission within the last 30 days:  No Current discharge risk:  Physical Impairment Barriers to Discharge:  Continued Medical Work up   Estanislado Emms, LCSW 05/19/2018, 3:47 PM

## 2018-05-19 NOTE — Progress Notes (Signed)
PROGRESS NOTE    Tim Campbell  FBP:[id number] DOB: Jul 20, 1926 DOA: 05/16/2018 PCP: [name], MD    Brief Narrative:  Patient is 84 year old gentleman history of coronary artery disease status post CABG, status post TAVR, hypothyroidism, hyperlipidemia, history of lymphoma variant been experiencing low back pain had MRI done which showed multiple metastatic lesions patient was subsequently referred to his oncologist by [facility] orthopedic surgeon.  Patient was seen by oncologist due to patient's complaints of generalized weakness he was referred to the ED.  Patient noted to have lower extremity edema.  Patient noted in the ED to have borderline blood pressure with a elevated troponin and EKG showing lateral T wave changes.  Lower extremity edema was concerning to EDP for volume overload and patient received a dose of IV Lasix.  Cardiology was consulted and patient admitted for non-ST elevated MI.   Assessment & Plan:   Principal Problem:   Non-ST elevated myocardial infarction [facility]) Active Problems:   Acute on chronic combined systolic and diastolic heart failure (HCC)   S/P TAVR (transcatheter aortic valve replacement)   Thrombocytopenia (HCC)   Hypothyroidism   AAA (abdominal aortic aneurysm) (HCC)   Acute on chronic systolic CHF (congestive heart failure) (HCC)   Acute lower UTI   Bilateral lower extremity edema  1 non-ST elevated MI/coronary artery disease/cardiomyopathy Patient presented with generalized weakness and back pain with MRI findings in the outpatient setting for metastatic disease.  Patient denies any acute chest pain or shortness of breath on evaluation.  Troponins elevated at 1.73, 1.57, 1.33, 1.30.  2D echo pending.  Due to thrombocytopenia patient not placed on IV heparin.  2D echo with a EF of 30 to 35%.  Cardiology recommended conservative treatment at this time with medical therapy.  Continue aspirin.  Lopressor has been changed to Coreg per cardiology.  Plavix  discontinued per cardiology secondary to thrombocytopenia.  Statin resumed.  Cardiology following.    2. BLE edema.??  Acute on chronic CHF exacerbation. Patient with history of cardiomyopathy.  Patient with borderline blood pressure.  Patient given a dose of IV Lasix.  Lower extremity edema may be secondary to cachexia and hypoalbuminemia.  Lower extremity Dopplers ordered and negative for DVT.  2D echo with a EF of 30 to 35%.  Patient denies any shortness of breath.  BNP elevated at 855.2.  Patient with a urine output of 1.2 L over the past 24 hours.  Patient on low-dose oral Lasix 20 mg daily per cardiology.  Continue low-dose Coreg.  Cardiology following.   3.  Hypothyroidism TSH at 2.020.  Synthroid.   4.  AAA Outpatient follow-up.  5.  Enterococcus faecalis/Klebsiella pneumonia UTI  Patient with chronic indwelling Foley catheter.  Urine cultures with Enterococcus faecalis and Klebsiella pneumonia.  IV Rocephin has been discontinued and patient started on oral Augmentin and will need a 7-day course.  Outpatient follow-up.    6.  Metastatic spinal lesions/anemia/thrombocytopenia Patient's oncologist has been informed via epic of patient's admission and patient was sent to the ED by his oncologist.  Plavix has been discontinued.  Patient seen by oncology, Dr. Marin Olp today 05/19/2018 CT scans of the chest abdomen and pelvis have been ordered for further evaluation as well as a bone marrow biopsy.  Per oncology.    7.  Generalized weakness Likely secondary to problem #6.  PT/OT.  Will need skilled nursing facility.  Follow.  8.  Hx of TAVR Continue aspirin.  Per cardiology  9.  Hypokalemia/hypomagnesemia Likely  secondary to diuresis.  Give a dose of IV magnesium to keep magnesium greater than 2.  Follow.  10.  Bouts of confusion Per daughter patient with small bouts of confusion.  Due to metastatic disease CT head was obtained which was negative for any acute abnormalities.     DVT  prophylaxis: SCDs Code Status: Partial Family Communication: Updated patient.  No family at bedside.   Disposition Plan: Skilled nursing facility when okay with oncology.    Consultants:   Cardiology: Dr. Aldine Contes 05/17/2018  Oncology: Dr. Marin Olp 05/19/2018  Procedures:  Lower extremity Dopplers  05/17/2018  2D echo.  05/17/2018  CT head 05/18/2018  CT chest/CT abdomen and pelvis 05/19/2018 pending  Bone marrow biopsy pending  Antimicrobials:   IV Rocephin 05/17/2018>>>> 05/18/2018  Augmentin 05/18/2018    Subjective: Patient in bed.  No chest pain no shortness of breath.  Feels lower extremity edema is improving.  Some complaints of back pain per nurse tech while turning patient over.   Objective: Vitals:   05/19/18 0034 05/19/18 0457 05/19/18 0722 05/19/18 1004  BP: 106/62 117/73 (!) 112/57   Pulse: 77 84 87 83  Resp: 18 (!) 24 (!) 23   Temp: (!) 97.2 F (36.2 C) (!) 97.5 F (36.4 C) (!) 97.4 F (36.3 C)   TempSrc: Oral Oral Oral   SpO2: 97% 98% 91%   Weight:  93 kg    Height:        Intake/Output Summary (Last 24 hours) at 05/19/2018 1118 Last data filed at 05/19/2018 0700 Gross per 24 hour  Intake 600 ml  Output 1200 ml  Net -600 ml   Filed Weights   05/17/18 0428 05/18/18 0405 05/19/18 0457  Weight: 90.6 kg 93.4 kg 93 kg    Examination:  General exam: NAD Respiratory system: Lungs clear to auscultation bilaterally.  No wheezes, no crackles, no rhonchi.  Normal respiratory effort.  Cardiovascular system: RRR no murmurs rubs or gallops.  No JVD.  1+ bilateral lower extremity edema.  Gastrointestinal system: Abdomen is nontender, nondistended, soft, positive bowel sounds.  No rebound.  No guarding.  Central nervous system: Alert and oriented. No focal neurological deficits. Extremities: Symmetric 5 x 5 power. Skin: No rashes, lesions or ulcers Psychiatry: Judgement and insight appear normal. Mood & affect appropriate.     Data Reviewed: I have  personally reviewed following labs and imaging studies  CBC: Recent Labs  Lab 05/16/18 1226 05/16/18 1450 05/17/18 0601 05/19/18 0345 05/19/18 0821  WBC 5.3 5.7 5.4 8.4 8.4  NEUTROABS 4.3 4.4 4.2  --  6.2  HGB 11.7* 12.0* 11.1* 11.7* 11.5*  HCT 35.4* 35.8* 34.1* 36.6* 36.3*  MCV 82.9 81.7 83.4 83.6 84.0  PLT 78* 75* 56* 58* 54*   Basic Metabolic Panel: Recent Labs  Lab 05/16/18 1226 05/16/18 1450 05/17/18 0006 05/17/18 0601 05/18/18 0404 05/19/18 0345  NA 135 135  --  138 136 134*  K 3.7 3.3*  --  3.0* 3.8 3.8  CL 99 98  --  99 98 99  CO2 25 27  --  _0 GLUCOSE 112* 132*  --  112* 128* 125*  BUN 34* 36*  --  30* 28* 26*  CREATININE 1.16 1.33*  --  1.38* 1.22 1.05  CALCIUM 8.0* 7.9*  --  7.8* 8.0* 8.0*  MG  --   --  1.5*  --  1.9 1.7   GFR: Estimated Creatinine Clearance: 54.6 mL/min (by C-G formula based on SCr  of 1.05 mg/dL). Liver Function Tests: Recent Labs  Lab 05/16/18 1226 05/16/18 1450 05/17/18 0601  AST 103* 101* 82*  ALT 46* 44 40  ALKPHOS 87 69 65  BILITOT 1.0 1.0 1.0  PROT 5.6* 5.7* 4.7*  ALBUMIN 2.8* 2.9* 2.4*   No results for input(s): LIPASE, AMYLASE in the last 168 hours. No results for input(s): AMMONIA in the last 168 hours. Coagulation Profile: Recent Labs  Lab 05/19/18 0821  INR 1.23   Cardiac Enzymes: Recent Labs  Lab 05/16/18 1814 05/17/18 0006 05/17/18 0601 05/17/18 1145  TROPONINI 1.73* 1.57* 1.33* 1.30*   BNP (last 3 results) No results for input(s): PROBNP in the last 8760 hours. HbA1C: Recent Labs    05/17/18 0601  HGBA1C 5.7*   CBG: No results for input(s): GLUCAP in the last 168 hours. Lipid Profile: No results for input(s): CHOL, HDL, LDLCALC, TRIG, CHOLHDL, LDLDIRECT in the last 72 hours. Thyroid Function Tests: Recent Labs    05/17/18 0006  TSH 2.020   Anemia Panel: No results for input(s): VITAMINB12, FOLATE, FERRITIN, TIBC, IRON, RETICCTPCT in the last 72 hours. Sepsis Labs: Recent Labs    Lab 05/16/18 1555  LATICACIDVEN 1.50    Recent Results (from the past 240 hour(s))  Urine culture     Status: Abnormal   Collection Time: 05/16/18  3:07 PM  Result Value Ref Range Status   Specimen Description   Final    URINE, RANDOM Performed at [facility], [address] [place], [place] [address]    Special Requests   Final    NONE Performed at [facility], [address] [place], [place] [address]    Culture (A)  Final    >=100,000 COLONIES/mL ENTEROCOCCUS FAECALIS 20,000 COLONIES/mL KLEBSIELLA PNEUMONIAE    Report Status 05/18/2018 FINAL  Final   Organism ID, Bacteria ENTEROCOCCUS FAECALIS (A)  Final   Organism ID, Bacteria KLEBSIELLA PNEUMONIAE (A)  Final      Susceptibility   Enterococcus faecalis - MIC*    AMPICILLIN <=2 SENSITIVE Sensitive     LEVOFLOXACIN 1 SENSITIVE Sensitive     NITROFURANTOIN <=16 SENSITIVE Sensitive     VANCOMYCIN 1 SENSITIVE Sensitive     * >=100,000 COLONIES/mL ENTEROCOCCUS FAECALIS   Klebsiella pneumoniae - MIC*    AMPICILLIN RESISTANT Resistant     CEFAZOLIN <=4 SENSITIVE Sensitive     CEFTRIAXONE <=1 SENSITIVE Sensitive     CIPROFLOXACIN <=0.25 SENSITIVE Sensitive     GENTAMICIN <=1 SENSITIVE Sensitive     IMIPENEM <=0.25 SENSITIVE Sensitive     NITROFURANTOIN 32 SENSITIVE Sensitive     TRIMETH/SULFA <=20 SENSITIVE Sensitive     AMPICILLIN/SULBACTAM 4 SENSITIVE Sensitive     PIP/TAZO <=4 SENSITIVE Sensitive     Extended ESBL NEGATIVE Sensitive     * 20,000 COLONIES/mL KLEBSIELLA PNEUMONIAE  MRSA PCR Screening     Status: None   Collection Time: 05/17/18  3:15 AM  Result Value Ref Range Status   MRSA by PCR NEGATIVE NEGATIVE Final    Comment:        The GeneXpert MRSA Assay (FDA approved for NASAL specimens only), is one component of a comprehensive MRSA colonization surveillance program. It is not intended to diagnose MRSA infection nor to guide or monitor treatment for MRSA  infections. Performed at [facility], [place] [address], [place], [place] [address]          Radiology Studies: Ct Head Wo Contrast  Result  Date: 05/18/2018 CLINICAL DATA:  Metastatic spine tumor suspected EXAM: CT HEAD WITHOUT CONTRAST TECHNIQUE: Contiguous axial images were obtained from the base of the skull through the vertex without intravenous contrast. COMPARISON:  Brain MRI 03/02/2005 FINDINGS: Brain: Bilateral inferior frontal gliosis with pattern suggesting remote contusion, stable since 2006 at least. Partly calcified far anterior right parafalcine mass measuring 13 mm. In retrospect there was meningioma in this location measuring 6 mm in 2006. Mild for age cerebral volume loss. No evidence of acute infarct, hemorrhage, hydrocephalus, or collection. Vascular: Atherosclerotic calcification.  No hyperdense vessel Skull: No acute finding. Sinuses/Orbits: Bilateral cataract resection. IMPRESSION: 1. No acute finding. 2. 13 mm anterior falcine meningioma with mild growth since 2006. 3. Remote bilateral inferior frontal contusion. Electronically Signed   By: Monte Fantasia M.D.   On: 05/18/2018 12:24        Scheduled Meds: . amoxicillin-clavulanate  1 tablet Oral Q12H  . aspirin EC  81 mg Oral Daily  . carvedilol  3.125 mg Oral BID WC  . dronabinol  2.5 mg Oral TID AC  . folic acid  1 mg Oral Once per day on Mon Wed Fri  . furosemide  20 mg Oral Daily  . iohexol  15 mL Oral Q1 Hr x 2  . iopamidol      . levothyroxine  125 mcg Oral QAC breakfast  . tamsulosin  0.4 mg Oral Daily   Continuous Infusions: . magnesium sulfate 1 - 4 g bolus IVPB 4 g (05/19/18 1019)     LOS: 3 days    Time spent: 35 minutes    Irine Seal, MD [facility] Pager [phone]  If 7PM-7AM, please contact night-coverage [email or web address] Password [email or web address] 05/19/2018, 11:18 AM

## 2018-05-19 NOTE — Progress Notes (Signed)
MD notified of VTach and Rapid Response called.

## 2018-05-19 NOTE — Consult Note (Signed)
Referral MD  Reason for Referral: Thrombocytopenia, bony lesions.  Chief Complaint  Patient presents with  . Hypotension  : I have no pain.  HPI: Tim Campbell is an 84 year old white male.  I saw him in the office 3 days ago.  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  He had chemotherapy and radiation therapy.  He, in my mind, is likely cured of this.  I saw him in the office on Friday, August 9.  There is obvious that he was incredibly weak.  He was not walking.  He was hospitalized.  There is a question of a non-ST elevated MI.  Cardiology is seeing him.  He has thrombocytopenia.  No other studies have been done to date since he was hospitalized.  He had a MRI which showed multifocal bone lesions.  Of course, no biopsy of these were ordered were done.  We are trying to sort out what is going on with him.  He clearly needs to have a bone marrow biopsy done.  His beta-2 microglobulin is quite elevated at 5.9.  The LDH is not back yet.  I think would be highly unusual for him to have recurrence of his lymphoma after 12-13 years.  He probably needs to have a bone scan done.  He clearly needs to have CT scans done.  He does look a lot better than when I last saw him.  His blood pressure is much better.  He has a good oxygen saturation.  He still is not eating all that much.  Overall, his performance status is ECOG 2.   Past Medical History:  Diagnosis Date  . AAA (abdominal aortic aneurysm) ([place])    a. 3.3x3.0cm by CT 05/2016.  Marland Kitchen BPH (benign prostatic hyperplasia)   . Chronic combined systolic and diastolic HF (heart failure), NYHA class 3   . Coronary artery disease CARDIOLOGIST-  DR Angelena Form   a. s/p CABGx5 in 1993. b. NSTEMI with occluded VG-diag in 2005. c. BMS to prox VG-PDA 2012. d. BMS to SVG-OM 03/2014. e. stable cath 05/2016.  . Diverticulosis of colon   . Heart murmur   . History of colon polyps     2002;  2007  . History of non-ST elevation myocardial infarction (NSTEMI)    x2 --- 12-29-2003  and  01-01-2013  . Hypertension   . Hypothyroidism   . Lower urinary tract symptoms (LUTS)   . Mixed hyperlipidemia   . Non Hodgkin's lymphoma ([place]) dx 2006 bone marrow bx--- onocologist-  dr Marin Olp   s/p  chemo and radiation therpay--- in remission since ---  . OA (osteoarthritis)   . Peripheral neuropathy   . Pulmonary nodules    a. by CT 05/2016  . RBBB (right bundle branch block)   . S/p bare metal coronary artery stent    10-16-2010  BMS to RCA graft  . S/P CABG x 5    05/ 1993  . 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 Urinary retention   . Wears dentures    upper  . Wears glasses   :  Past Surgical History:  Procedure Laterality Date  . CARDIAC CATHETERIZATION N/A 05/17/2016   Procedure: Right/Left Heart Cath and Coronary/Graft Angiography;  Surgeon: Nelva Bush, MD;  Location: [facility];  Service: Cardiovascular;  Laterality: N/A;  severe CAD,  total occlusion of mLAD, dLCx, and mRCA, 99% ostial LCX/ wide  patent LIMA to LAD & SVG to OM, patent SVG to PDA/PL w/ stable 50% ISR at ostium SVG/ chronic occluded SVG to Diagonal/ severe AVS  . CARDIAC CATHETERIZATION  12-30-2003  dr Leonia Reeves   severe 3v cad:  occlused SVG to diagonal causing NSTEMI,  ef 45%  . CATARACT EXTRACTION W/ INTRAOCULAR LENS  IMPLANT, BILATERAL    . COLONOSCOPY    . CORONARY ANGIOPLASTY WITH STENT PLACEMENT  10-20-2010  dr Daneen Schick   BMS to ostium of the RCA graft:  total occlusion LAD, CFX, and RCA,  bypass graft 's-- total occlusion SVG to diagonal,  high-grade obstruction prox.Alfredo Batty segment of RCA graft,  mid SVG to OM 50% otherwise patent/  mild AVS inferior wall hypokinesis  . CORONARY ARTERY BYPASS GRAFT  05/ 1993     dr gerhart   LIMA to LAD,  SVG to D1,  SVT to RCA,  SVG to OM,  SVG to PDA  . [place]  . I&D EXTREMITY Left 03/09/2016    Procedure: IRRIGATION AND DEBRIDEMENT WRIST;  Surgeon: Milly Jakob, MD;  Location: [facility];  Service: Orthopedics;  Laterality: Left;  . INGUINAL HERNIA REPAIR Right 02/21/2006  . KNEE ARTHROSCOPY Right 1970'S   . LEFT AND RIGHT HEART CATHETERIZATION WITH CORONARY/GRAFT ANGIOGRAM N/A 04/05/2014   Procedure: LEFT AND RIGHT HEART CATHETERIZATION WITH [name];  Surgeon: Burnell Blanks, MD;  Location[facility] C[facility] LAB;  Service: Cardiovascular;  Laterality: N/A;  . LEFT HEART CATHETERIZATION WITH CORONARY/GRAFT ANGIOGRAM N/A 01/02/2013   Procedure: LEFT HEART CATHETERIZATION WITH [name];  Surgeon: [name], MD;  Location: [facility] CATH LAB;  Service: Cardiovascular;  Laterality: N/A;NSTEMI:side-to-side anastomosis w/the PDA is threatened(not amenable to PCI)in-stent restenosis  RCA graft,  50% osyial SVG to OM , LIMA to LAD patent w/ diffuse distal LAD disease/  severe 3V CAD w/ occlusion LAD, CFX, RCA, ef 45%  . OPEN BX LEFT CERVICAL MASS, POSTERIOR NECK  10/03/2004  . OPEN REDUCTION INTERNAL FIXATION (ORIF) DISTAL RADIAL FRACTURE Left 02/24/2016   Procedure: OPEN TREATMENT OF LEFT DISTAL RADIUS FRACTURE;  Surgeon: Milly Jakob, MD;  Location: [place];  Service: Orthopedics;  Laterality: Left;  . ORIF RIGHT ANKLE FX  10/07/2008  . PERCUTANEOUS CORONARY STENT INTERVENTION (PCI-S) N/A 04/21/2014   Procedure: PERCUTANEOUS CORONARY STENT INTERVENTION (PCI-S);  Surgeon: [name], MD;  Location: [facility] CATH LAB;  Service: Cardiovascular;  Laterality: N/A;   BMS in the ostial/proximal SVG to CFX,  stable 50-70% ostial SVG to RCA (previous stenting)  . TEE WITHOUT CARDIOVERSION N/A 06/26/2016   Procedure: TRANSESOPHAGEAL ECHOCARDIOGRAM (TEE);  Surgeon: Burnell Blanks, MD;  Location: [facility];  Service: Open Heart Surgery;  Laterality: N/A;  . THULIUM LASER TURP (TRANSURETHRAL RESECTION OF PROSTATE) N/A 12/05/2016   Procedure: THULIUM LASER TURP (TRANSURETHRAL  RESECTION OF PROSTATE);  Surgeon: Nickie Retort, MD;  Location: [facility];  Service: Urology;  Laterality: N/A;  . TRANSCATHETER AORTIC VALVE REPLACEMENT, TRANSFEMORAL Bilateral 06/26/2016   Procedure: TRANSCATHETER AORTIC VALVE REPLACEMENT, TRANSFEMORAL;  Surgeon: Burnell Blanks, MD;  Location: [facility];  Service: Open Heart Surgery;  Laterality: Bilateral;  . TRANSTHORACIC ECHOCARDIOGRAM  08/03/2016    post TAVR    ef 40-45%,  akinesis and scarring of the basal-inferolateral, inferior, and inferoseptal (consistent w/ infarction in distribution of the RCA), severe hypokinesis of the basal anteroseptal (consistent w/ ischemia in distribution of the LAD but w/ patent distal graft),  grade 2 diastolic dysfunction/  AV stent valve bioprosthesis normal function w/ trivial regurg (valve area 1.16cm^2)/  . TRANSTHORACIC ECHOCARDIOGRAM  08-03-2016  continued results- post TAVR   mild dilated ascending aorta/  mild to moderate MR, peak grandient 68mHg/  severe LAE and mild RAE/  mild PR/  trivial TR  :   Current Facility-Administered Medications:  .  acetaminophen (TYLENOL) tablet 650 mg, 650 mg, Oral, Q6H PRN, 650 mg at 05/18/18 1639 **OR** acetaminophen (TYLENOL) suppository 650 mg, 650 mg, Rectal, Q6H PRN, TEugenie Filler MD .  amoxicillin-clavulanate (AUGMENTIN) 875-125 MG per tablet 1 tablet, 1 tablet, Oral, Q12H, TEugenie Filler MD, 1 tablet at 05/18/18 2235 .  aspirin EC tablet 81 mg, 81 mg, Oral, Daily, TEugenie Filler MD, 81 mg at 05/18/18 0908 .  carvedilol (COREG) tablet 3.125 mg, 3.125 mg, Oral, BID WC, TEugenie Filler MD, 3.125 mg at 05/18/18 1639 .  folic acid (FOLVITE) tablet 1 mg, 1 mg, Oral, Once per day on Mon Wed Fri, TEugenie Filler MD .  furosemide (LASIX) tablet 20 mg, 20 mg, Oral, Daily, TEugenie Filler MD, 20 mg at 05/18/18 1307 .  levothyroxine (SYNTHROID, LEVOTHROID) tablet 125 mcg, 125 mcg, Oral, QAC breakfast, TEugenie Filler MD, 125 mcg at  05/18/18 0908 .  nitroGLYCERIN (NITROSTAT) SL tablet 0.4 mg, 0.4 mg, Sublingual, Q5 min PRN, TEugenie Filler MD .  ondansetron ([facility] tablet 4 mg, 4 mg, Oral, Q6H PRN **OR** ondansetron (ZOFRAN) injection 4 mg, 4 mg, Intravenous, Q6H PRN, TEugenie Filler MD .  oxyCODONE (Oxy IR/ROXICODONE) immediate release tablet 5 mg, 5 mg, Oral, Q4H PRN, TEugenie Filler MD .  tamsulosin ([facility] capsule 0.4 mg, 0.4 mg, Oral, Daily, TEugenie Filler MD, 0.4 mg at 05/18/18 0909 .  tiZANidine (ZANAFLEX) tablet 2 mg, 2 mg, Oral, QHS PRN, TEugenie Filler MD:  . amoxicillin-clavulanate  1 tablet Oral Q12H  . aspirin EC  81 mg Oral Daily  . carvedilol  3.125 mg Oral BID WC  . folic acid  1 mg Oral Once per day on Mon Wed Fri  . furosemide  20 mg Oral Daily  . levothyroxine  125 mcg Oral QAC breakfast  . tamsulosin  0.4 mg Oral Daily  :  No Known Allergies:  Family History  Problem Relation Age of Onset  . Colon cancer Father   . Stroke Mother   . CAD Brother   . Stroke Sister   . Bone cancer Brother   . Parkinson's disease Brother   . Dementia Sister   . Stroke Sister   :  Social History   Socioeconomic History  . Marital status: Married    Spouse name: Not on file  . Number of children: 3  . Years of education: Not on file  . Highest education level: Not on file  Occupational History  . 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 Financial resource strain: Not on file  . Food insecurity:    Worry: Not on file    Inability: Not on file  . Transportation needs:    Medical: Not on file    Non-medical: Not on file  Tobacco Use  . Smoking status: Former Smoker    Packs/day: 0.50    Years: 15.00    Pack years: 7.50    Types: Cigarettes    Last attempt to quit: 06/22/1961    Years since quitting: 56.9  . Smokeless tobacco: Never Used  Substance and Sexual Activity  . Alcohol use: No  . Drug use: No  . Sexual activity: Not  on file  Lifestyle  . Physical activity:    Days per week: Not on file    Minutes per session: Not on file  . Stress: Not on file  Relationships  . Social connections:    Talks on phone: Not on file    Gets together: Not on file    Attends religious service: Not on file    Active member of club or organization: Not on file    Attends meetings of clubs or organizations: Not on file    Relationship status: Not on file  . Intimate partner violence:    Fear of current or ex partner: Not on file    Emotionally abused: Not on file    Physically abused: Not on file    Forced sexual activity: Not on file  Other Topics Concern  . Not on file  Social History Narrative  . Not on file  :  Pertinent items are noted in HPI.  Exam: As above Patient Vitals for the past 24 hrs:  BP Temp Temp src Pulse Resp SpO2 Weight  05/19/18 0457 117/73 (!) 97.5 F (36.4 C) Oral 84 (!) 24 98 % 205 lb 1.6 oz (93 kg)  05/19/18 0034 106/62 (!) 97.2 F (36.2 C) Oral 77 18 97 % -  05/18/18 2016 (!) 89/47 97.7 F (36.5 C) Oral 76 18 94 % -  05/18/18 1550 (!) 100/53 98.7 F (37.1 C) Oral 84 (!) 29 98 % -  05/18/18 0908 (!) 102/50 - - 87 (!) 31 100 % -  05/18/18 0727 (!) 109/56 98.2 F (36.8 C) Oral 86 (!) 21 97 % -     Recent Labs    05/17/18 0601 05/19/18 0345  WBC 5.4 8.4  HGB 11.1* 11.7*  HCT 34.1* 36.6*  PLT 56* 58*   Recent Labs    05/18/18 0404 05/19/18 0345  NA 136 134*  K 3.8 3.8  CL 98 99  CO2 28 28  GLUCOSE 128* 125*  BUN 28* 26*  CREATININE 1.22 1.05  CALCIUM 8.0* 8.0*    Blood smear review: None  Pathology: None    Assessment and Plan: Tim Campbell is a very nice 84 year old gentleman.  He is admitted.  He apparently had a non-ST elevated MI.  He does not think he has had a problem.  We definitely have some problem going on with his bones.  He is going to need to have a bone marrow biopsy done to see if this can help [place] out.  I will see what the CT scan shows.  We  will see if he has any lymphadenopathy on the CT scan.  Maybe the CT scan can show [place] a little more bone detail.  Is possible we may have to consider a bone biopsy which I think would be suboptimal.  Again, he is looking better.  His blood pressure is a lot better.  We will follow along closely until we know what the diagnosis is going to be for a hematologic/oncologic issue.  I appreciate the wonderful care that he will get from all the staff up on 6 E.  Tim Haw, MD  1 Mikeal Hawthorne 12:16

## 2018-05-19 NOTE — Progress Notes (Signed)
Progress Note  Patient Name: Tim Campbell Date of Encounter: 05/19/2018  Primary Cardiologist: Dr Smith/Dr Angelena Form  Subjective   Diuresed some overnight. Now 1.8L negative. Tolerating low dose coreg. Medical therapy has been recommended for elevated troponin and acute decompensated systolic congestive heart failure.  Inpatient Medications    Scheduled Meds: . acetaminophen  500 mg Oral TID  . amoxicillin-clavulanate  1 tablet Oral Q12H  . aspirin EC  81 mg Oral Daily  . carvedilol  3.125 mg Oral BID WC  . dronabinol  2.5 mg Oral TID AC  . folic acid  1 mg Oral Once per day on Mon Wed Fri  . furosemide  20 mg Oral Daily  . iohexol  15 mL Oral Q1 Hr x 2  . iopamidol      . levothyroxine  125 mcg Oral QAC breakfast  . tamsulosin  0.4 mg Oral Daily   Continuous Infusions: . magnesium sulfate 1 - 4 g bolus IVPB 4 g (05/19/18 1019)   PRN Meds: acetaminophen **OR** acetaminophen, nitroGLYCERIN, ondansetron **OR** ondansetron (ZOFRAN) IV, oxyCODONE, tiZANidine   Vital Signs    Vitals:   05/19/18 0034 05/19/18 0457 05/19/18 0722 05/19/18 1004  BP: 106/62 117/73 (!) 112/57   Pulse: 77 84 87 83  Resp: 18 (!) 24 (!) 23   Temp: (!) 97.2 F (36.2 C) (!) 97.5 F (36.4 C) (!) 97.4 F (36.3 C)   TempSrc: Oral Oral Oral   SpO2: 97% 98% 91%   Weight:  93 kg    Height:        Intake/Output Summary (Last 24 hours) at 05/19/2018 1120 Last data filed at 05/19/2018 0700 Gross per 24 hour  Intake 600 ml  Output 1200 ml  Net -600 ml   Filed Weights   05/17/18 0428 05/18/18 0405 05/19/18 0457  Weight: 90.6 kg 93.4 kg 93 kg    Telemetry    Sinus rhythm, occasional PVC's - couplets  Physical Exam   General appearance: alert and no distress Neck: no carotid bruit, no JVD and thyroid not enlarged, symmetric, no tenderness/mass/nodules Lungs: clear to auscultation bilaterally Heart: regular rate and rhythm and systolic murmur: early systolic 2/6, crescendo at 2nd right  intercostal space Abdomen: soft, non-tender; bowel sounds normal; no masses,  no organomegaly Extremities: extremities normal, atraumatic, no cyanosis or edema Pulses: 2+ and symmetric Skin: Skin color, texture, turgor normal. No rashes or lesions Neurologic: Mental status: Alert, oriented, thought content appropriate Psych: Pleasant   Labs    Chemistry Recent Labs  Lab 05/16/18 1226  05/16/18 1450 05/17/18 0601 05/18/18 0404 05/19/18 0345  NA 135  --  135 138 136 134*  K 3.7  --  3.3* 3.0* 3.8 3.8  CL 99  --  98 99 98 99  CO2 25  --  27 29 28 28   GLUCOSE 112*  --  132* 112* 128* 125*  BUN 34*  --  36* 30* 28* 26*  CREATININE 1.16   < > 1.33* 1.38* 1.22 1.05  CALCIUM 8.0*  --  7.9* 7.8* 8.0* 8.0*  PROT 5.6*  --  5.7* 4.7*  --   --   ALBUMIN 2.8*  --  2.9* 2.4*  --   --   AST 103*  --  101* 82*  --   --   ALT 46*  --  44 40  --   --   ALKPHOS 87  --  69 65  --   --  BILITOT 1.0  --  1.0 1.0  --   --   GFRNONAA 54*   < > 46* 44* 51* >60  GFRAA >60   < > 53* 51* 59* >60  ANIONGAP 11  --  10 10 10 7    < > = values in this interval not displayed.     Hematology Recent Labs  Lab 05/17/18 0601 05/19/18 0345 05/19/18 0821  WBC 5.4 8.4 8.4  RBC 4.09* 4.38 4.32  HGB 11.1* 11.7* 11.5*  HCT 34.1* 36.6* 36.3*  MCV 83.4 83.6 84.0  MCH 27.1 26.7 26.6  MCHC 32.6 32.0 31.7  RDW 15.5 15.4 15.5  PLT 56* 58* 54*    Cardiac Enzymes Recent Labs  Lab 05/16/18 1814 05/17/18 0006 05/17/18 0601 05/17/18 1145  TROPONINI 1.73* 1.57* 1.33* 1.30*    BNP Recent Labs  Lab 05/16/18 1450  BNP 855.2*     Radiology    Ct Head Wo Contrast  Result Date: 05/18/2018 CLINICAL DATA:  Metastatic spine tumor suspected EXAM: CT HEAD WITHOUT CONTRAST TECHNIQUE: Contiguous axial images were obtained from the base of the skull through the vertex without intravenous contrast. COMPARISON:  Brain MRI 03/02/2005 FINDINGS: Brain: Bilateral inferior frontal gliosis with pattern suggesting  remote contusion, stable since 2006 at least. Partly calcified far anterior right parafalcine mass measuring 13 mm. In retrospect there was meningioma in this location measuring 6 mm in 2006. Mild for age cerebral volume loss. No evidence of acute infarct, hemorrhage, hydrocephalus, or collection. Vascular: Atherosclerotic calcification.  No hyperdense vessel Skull: No acute finding. Sinuses/Orbits: Bilateral cataract resection. IMPRESSION: 1. No acute finding. 2. 13 mm anterior falcine meningioma with mild growth since 2006. 3. Remote bilateral inferior frontal contusion. Electronically Signed   By: Monte Fantasia M.D.   On: 05/18/2018 12:24    Patient Profile     Tim Campbell is a 84 y.o. male with a history of CAD s/p 5V-CABG in 1993 with subsequent stenting to grafts (BMS to prox VG-PDA in 2012, BMS to SVG-OM in 03/2014), severe AS s/p TAVR in 06/2016 with 29 mm Sapien 3 valve, HFrEF with last known EF 40-45%, HTN, RBBB, HLD, hypothyroidism, large cell non-Hodgkins lympoma, BPH s/p thallium laser ablation of his prostate on 12/05/16 with indwelling catheter who presents from OSH with troponinemia in the setting of generalized weakness, leg swelling and hypotension. Of note, has a recent MRI lumbar spine demonstrating multifocal bone lesions most consistent with metastatic carcinoma.   Assessment & Plan    1 elevated troponin-patient denies chest pain.  There is no clear trend. Findings consistent with cardiomyopathy. No plans for cath.  2 cardiomyopathy-echocardiogram shows ejection fraction 30 to 35%.  We will continue with beta-blockade but changed to carvedilol 3.125 mg twice daily.  BP will not allow addition of ACEI/ARB/ARNI. On oral lasix 20 mg daily, staying net negative.  3 coronary artery disease-continue aspirin and restart statin.  4 MRI suggestive of metastatic carcinoma-further evaluation per primary care.  5 prior aortic valve replacement-stable on echocardiogram.  6  thrombocytopenia-we will continue aspirin 81 mg daily for coronary disease and prior aortic valve replacement but discontinue Plavix.  No further suggestions at this time.[facility] will sign off.   Medication Recommendations:  Coreg 3.125 mg BID, Lasix 20 mg daily, aspirin 81 mg daily, and atorvastatin 40 mg QHS Other recommendations (labs, testing, etc):  PRN Follow up as an outpatient:  Dr. Daneen Schick.  Pixie Casino, MD, [facility], [place]  [facility]  [facility]ovascular [facility] Diplomate of the American Board of Clinical Lipidology Attending Cardiologist  Direct Dial: [phone]  Fax: [phone]  Website:  [email or web address]  Pixie Casino, MD  05/19/2018, 11:20 AM

## 2018-05-19 NOTE — NC FL2 (Signed)
[place] LEVEL OF CARE SCREENING TOOL     IDENTIFICATION  Patient Name: Tim Campbell Birthdate: 01-28-1926 Sex: male Admission Date (Current Location): 05/16/2018  [facility] and [place] Number:  Herbalist and Address:  [facility], [place] [address], [place], [place] [address]      Provider Number: [id number]  Attending Physician Name and Address:  Eugenie Filler, MD  Relative Name and Phone Number:       Current Level of Care: Hospital Recommended Level of Care: [place] Prior Approval Number:    Date Approved/Denied:   PASRR Number: [id number]  Discharge Plan: SNF    Current Diagnoses: Patient Active Problem List   Diagnosis Date Noted  . Bilateral lower extremity edema   . Non-ST elevated myocardial infarction ([place]) 05/16/2018  . Acute on chronic systolic CHF (congestive heart failure) ([place]) 05/16/2018  . Acute lower UTI 05/16/2018  . Acute urinary retention 12/17/2016  . Hematuria, gross- s/p TURP ~ 12 days prior to admission. 12/17/2016  . Renal failure, acute on chronic (HCC) 12/17/2016  . Gross hematuria   . Urinary retention   . Acute renal failure superimposed on stage 3 chronic kidney disease ([place])   . Pulmonary nodules 07/07/2016  . S/P TAVR (transcatheter aortic valve replacement) 06/28/2016  . Thrombocytopenia ([place]) 06/28/2016  . Hypothyroidism 06/28/2016  . RBBB 06/28/2016  . AAA (abdominal aortic aneurysm) ([place])   . Severe aortic valve stenosis 06/26/2016  . Postoperative wound infection 03/11/2016  . Preoperative cardiovascular examination 02/20/2016  . Hyperlipidemia 06/17/2014  . Acute on chronic combined systolic and diastolic heart failure ([place]) 09/09/2013  . Coronary artery disease involving native coronary artery of native heart with angina pectoris ([place]) 01/03/2013  . HTN (hypertension) 01/03/2013  . Non-ST elevation MI (NSTEMI) ([place]) 01/02/2013    Class: Acute     Orientation RESPIRATION BLADDER Height & Weight     Self, Time, Situation, Place  Normal Incontinent, Indwelling catheter Weight: 205 lb 1.6 oz (93 kg) Height:  5\' 10"  (177.8 cm)  BEHAVIORAL SYMPTOMS/MOOD NEUROLOGICAL BOWEL NUTRITION STATUS      Continent Diet(see discharge summary)  AMBULATORY STATUS COMMUNICATION OF NEEDS Skin   Extensive Assist Verbally Skin abrasions(abrasion on arm and knee)                       Personal Care Assistance Level of Assistance  Feeding, Bathing, Dressing Bathing Assistance: Maximum assistance Feeding assistance: Independent Dressing Assistance: Maximum assistance     Functional Limitations Info  Hearing, Sight, Speech Sight Info: Adequate Hearing Info: Impaired Speech Info: Adequate    SPECIAL CARE FACTORS FREQUENCY  PT (By licensed PT), OT (By licensed OT)     PT Frequency: 5x week OT Frequency: 5x week            Contractures Contractures Info: Not present    Additional Factors Info  Code Status, Allergies Code Status Info: Partial Code Allergies Info: No Known Allergies            Current Medications (05/19/2018):  This is the current hospital active medication list Current Facility-Administered Medications  Medication Dose Route Frequency Provider Last Rate Last Dose  . acetaminophen (TYLENOL) tablet 650 mg  650 mg Oral Q6H PRN Eugenie Filler, MD   650 mg at 05/18/18 1639   Or  . acetaminophen (TYLENOL) suppository 650 mg  650 mg Rectal Q6H PRN Eugenie Filler, MD      .  acetaminophen (TYLENOL) tablet 500 mg  500 mg Oral TID Eugenie Filler, MD      . amoxicillin-clavulanate (AUGMENTIN) 875-125 MG per tablet 1 tablet  1 tablet Oral Q12H Eugenie Filler, MD   1 tablet at 05/19/18 1005  . aspirin EC tablet 81 mg  81 mg Oral Daily Eugenie Filler, MD   81 mg at 05/19/18 1004  . atorvastatin (LIPITOR) tablet 40 mg  40 mg Oral q1800 Hilty, Nadean Corwin, MD      . carvedilol (COREG) tablet 3.125 mg  3.125  mg Oral BID WC Eugenie Filler, MD   3.125 mg at 05/19/18 1005  . dronabinol (MARINOL) capsule 2.5 mg  2.5 mg Oral TID AC Volanda Napoleon, MD   2.5 mg at 05/19/18 1310  . folic acid (FOLVITE) tablet 1 mg  1 mg Oral Once per day on Mon Wed Fri Eugenie Filler, MD   1 mg at 05/19/18 1030  . furosemide (LASIX) tablet 20 mg  20 mg Oral Daily Eugenie Filler, MD   20 mg at 05/19/18 1005  . iopamidol (ISOVUE-300) 61 % injection           . levothyroxine (SYNTHROID, LEVOTHROID) tablet 125 mcg  125 mcg Oral QAC breakfast Eugenie Filler, MD   125 mcg at 05/19/18 1004  . nitroGLYCERIN (NITROSTAT) SL tablet 0.4 mg  0.4 mg Sublingual Q5 min PRN Eugenie Filler, MD      . ondansetron [facility]) tablet 4 mg  4 mg Oral Q6H PRN Eugenie Filler, MD       Or  . ondansetron [facility]) injection 4 mg  4 mg Intravenous Q6H PRN Eugenie Filler, MD      . oxyCODONE (Oxy IR/ROXICODONE) immediate release tablet 5 mg  5 mg Oral Q4H PRN Eugenie Filler, MD      . tamsulosin [facility]) capsule 0.4 mg  0.4 mg Oral Daily Eugenie Filler, MD   0.4 mg at 05/19/18 1005  . tiZANidine (ZANAFLEX) tablet 2 mg  2 mg Oral QHS PRN Eugenie Filler, MD         Discharge Medications: Please see discharge summary for a list of discharge medications.  Relevant Imaging Results:  Relevant Lab Results:   Additional Information SS# [place], [place]

## 2018-05-19 NOTE — Progress Notes (Signed)
Occupational Therapy Treatment Patient Details Name: Tim Campbell MRN: [id number] DOB: Jan 29, 1926 Today's Date: 05/19/2018    History of present illness 84 y.o. male admitted to ED by referral from oncologist for generalized weakness. 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 PMH includes. Non Hodgkins lymphoma, TAVR, CABG, peripheral neuropathy, OA, AAA, CAD, R ankle ORF, L distal radial fx.    OT comments  Pt continues to present with decreased balance, strength, and activity tolerance. Pt requiring Max A for sit<>stand and functional mobility to recliner demonstrating decreased strength compared to prior session. Pt presenting with fear of falling and requires max cues for sequencing. Pt performing grooming ans self feeding tasks while seated at recliner. Continue to recommend dc to SNF and will continue to follow acutely as admitted.    Follow Up Recommendations  SNF;Supervision/Assistance - 24 hour    Equipment Recommendations  None recommended by OT    Recommendations for Other Services PT consult    Precautions / Restrictions Precautions Precautions: Fall Restrictions Weight Bearing Restrictions: No       Mobility Bed Mobility Overal bed mobility: Needs Assistance Bed Mobility: Supine to Sit     Supine to sit: Max assist;+2 for safety/equipment     General bed mobility comments: Max A to bring BLEs to EOB and elevate trunk  Transfers Overall transfer level: Needs assistance Equipment used: Rolling walker (2 wheeled) Transfers: Sit to/from Stand Sit to Stand: Max assist;From elevated surface         General transfer comment: Max A to power up into standing and then gain balance. Pt with posterior lean and reports he feels he will fall forward. Max cues for posterioral corrections.    Balance Overall balance assessment: Needs assistance Sitting-balance support: No upper extremity supported;Feet supported Sitting balance-Leahy  Scale: Fair     Standing balance support: Bilateral upper extremity supported;During functional activity Standing balance-Leahy Scale: Poor                             ADL either performed or assessed with clinical judgement   ADL Overall ADL's : Needs assistance/impaired   Eating/Feeding Details (indicate cue type and reason): Daughter assiting pt in opening containers. Grooming: Set up;Supervision/safety;Sitting Grooming Details (indicate cue type and reason): Using hand saniziter to wash hands.                 Toilet Transfer: Ambulation;RW;Maximal assistance;+2 for safety/equipment(Simulated to recliner) [employer] forces technical [employer] Details (indicate cue type and reason): Pt requiring Max A for simualted toielt transfer and dmeonstrating decreased strength compared to prior session. Pt with increased fear of falling this session and required Max cues for sequencing. Pt attemting to sit once he became fatigued and required max cues to maitnain standing untill seat was brought behind him.         Functional mobility during ADLs: Rolling walker;Cueing for safety;Cueing for sequencing;Maximal assistance;+2 for safety/equipment General ADL Comments: Pt continue to present with decreased strength and activity tolerance. VSS     Vision       Perception     Praxis      Cognition Arousal/Alertness: Awake/alert Behavior During Therapy: WFL for tasks assessed/performed Overall Cognitive Status: Within Functional Limits for tasks assessed  Exercises     Shoulder Instructions       General Comments Family present during session    Pertinent Vitals/ Pain       Pain Assessment: Faces Faces Pain Scale: Hurts a little bit Pain Location: low back Pain Descriptors / Indicators: Aching;Discomfort Pain Intervention(s): Monitored during session;Limited activity within patient's tolerance;Repositioned  Home Living                                           Prior Functioning/Environment              Frequency  Min 2X/week        Progress Toward Goals  OT Goals(current goals can now be found in the care plan section)  Progress towards OT goals: Progressing toward goals  Acute Rehab OT Goals Patient Stated Goal: get stronger OT Goal Formulation: With patient/family Time For Goal Achievement: 06/01/18 Potential to Achieve Goals: Good ADL Goals Pt Will Perform Grooming: with min guard assist;standing Pt Will Perform Upper Body Dressing: with set-up;with supervision;sitting Pt Will Perform Lower Body Dressing: with min assist;sit to/from stand Pt Will Transfer to Toilet: with min assist;bedside commode;ambulating Pt Will Perform Toileting - Clothing Manipulation and hygiene: with min assist;sit to/from stand  Plan Discharge plan remains appropriate    Co-evaluation    PT/OT/SLP Co-Evaluation/Treatment: Yes            AM-PAC PT "6 Clicks" Daily Activity     Outcome Measure   Help from another person eating meals?: None Help from another person taking care of personal grooming?: A Little Help from another person toileting, which includes using toliet, bedpan, or urinal?: A Lot Help from another person bathing (including washing, rinsing, drying)?: A Lot Help from another person to put on and taking off regular upper body clothing?: A Little Help from another person to put on and taking off regular lower body clothing?: A Lot 6 Click Score: 16    End of Session Equipment Utilized During Treatment: Gait belt;Rolling walker  OT Visit Diagnosis: Unsteadiness on feet (R26.81);Other abnormalities of gait and mobility (R26.89);Muscle weakness (generalized) (M62.81)   Activity Tolerance Patient tolerated treatment well   Patient Left in chair;with call bell/phone within reach;with family/visitor present   Nurse Communication Mobility status        Time:  7673-4193 OT Time Calculation (min): 27 min  Charges: OT General Charges $OT Visit: 1 Visit OT Treatments $Self Care/Home Management : 23-37 mins  [place], OTR/L Acute Rehab Pager: [phone] Office: [place] 05/19/2018, 4:46 PM

## 2018-05-19 NOTE — Telephone Encounter (Signed)
Spoke with daughter and made her aware of what Dr. Tamala Julian said.  Advised her once pt is d/c'ed to reach out to [place] with any issues and pt will likely need an appt.  Daughter verbalized understanding and was in agreement with this plan.

## 2018-05-20 ENCOUNTER — Inpatient Hospital Stay (HOSPITAL_COMMUNITY): Payer: Medicare Other

## 2018-05-20 DIAGNOSIS — I5043 Acute on chronic combined systolic (congestive) and diastolic (congestive) heart failure: Secondary | ICD-10-CM

## 2018-05-20 DIAGNOSIS — I472 Ventricular tachycardia, unspecified: Secondary | ICD-10-CM

## 2018-05-20 DIAGNOSIS — C799 Secondary malignant neoplasm of unspecified site: Secondary | ICD-10-CM

## 2018-05-20 LAB — BASIC METABOLIC PANEL
ANION GAP: 8 (ref 5–15)
Anion gap: 9 (ref 5–15)
Anion gap: 10 (ref 5–15)
BUN: 21 mg/dL (ref 8–23)
BUN: 22 mg/dL (ref 8–23)
BUN: 24 mg/dL — AB (ref 8–23)
CHLORIDE: 98 mmol/L (ref 98–111)
CO2: 27 mmol/L (ref 22–32)
CO2: 25 mmol/L (ref 22–32)
CO2: 25 mmol/L (ref 22–32)
CREATININE: 0.9 mg/dL (ref 0.61–1.24)
CREATININE: 0.89 mg/dL (ref 0.61–1.24)
Calcium: 7.6 mg/dL — ABNORMAL LOW (ref 8.9–10.3)
Calcium: 7.9 mg/dL — ABNORMAL LOW (ref 8.9–10.3)
Calcium: 7.9 mg/dL — ABNORMAL LOW (ref 8.9–10.3)
Chloride: 99 mmol/L (ref 98–111)
Chloride: 101 mmol/L (ref 98–111)
Creatinine, Ser: 0.92 mg/dL (ref 0.61–1.24)
GFR calc Af Amer: >60 mL/min (ref >60)
GFR calc Af Amer: >60 mL/min (ref >60)
GFR calc non Af Amer: >60 mL/min (ref >60)
GLUCOSE: 140 mg/dL — AB (ref 70–99)
GLUCOSE: 142 mg/dL — AB (ref 70–99)
Glucose, Bld: 137 mg/dL — ABNORMAL HIGH (ref 70–99)
POTASSIUM: 4.4 mmol/L (ref 3.5–5.1)
Potassium: 3.8 mmol/L (ref 3.5–5.1)
Potassium: 3.8 mmol/L (ref 3.5–5.1)
Sodium: 133 mmol/L — ABNORMAL LOW (ref 135–145)
Sodium: 136 mmol/L (ref 135–145)
Sodium: 133 mmol/L — ABNORMAL LOW (ref 135–145)

## 2018-05-20 LAB — CBC
HEMATOCRIT: 36.2 % — AB (ref 39.0–52.0)
Hemoglobin: 11.5 g/dL — ABNORMAL LOW (ref 13.0–17.0)
MCH: 26.6 pg (ref 26.0–34.0)
MCHC: 31.8 g/dL (ref 30.0–36.0)
MCV: 83.8 fL (ref 78.0–100.0)
PLATELETS: 77 10*3/uL — AB (ref 150–400)
RBC: 4.32 MIL/uL (ref 4.22–5.81)
RDW: 15.6 % — AB (ref 11.5–15.5)
WBC: 8.7 10*3/uL (ref 4.0–10.5)

## 2018-05-20 LAB — MAGNESIUM
MAGNESIUM: 2.2 mg/dL (ref 1.7–2.4)
Magnesium: 1.9 mg/dL (ref 1.7–2.4)
Magnesium: 1.9 mg/dL (ref 1.7–2.4)

## 2018-05-20 LAB — PATHOLOGIST SMEAR REVIEW

## 2018-05-20 LAB — PSA: Prostatic Specific Antigen: 155 ng/mL — ABNORMAL HIGH (ref 0.00–4.00)

## 2018-05-20 MED ORDER — LIDOCAINE HCL 1 % IJ SOLN
INTRAMUSCULAR | Status: AC
  Filled 2018-05-20: qty 20

## 2018-05-20 MED ORDER — POTASSIUM CHLORIDE CRYS ER 20 MEQ PO TBCR
40.0000 meq | EXTENDED_RELEASE_TABLET | Freq: Once | ORAL | Status: AC
  Administered 2018-05-20: 40 meq via ORAL
  Filled 2018-05-20: qty 2

## 2018-05-20 MED ORDER — LIDOCAINE HCL (CARDIAC) PF 100 MG/5ML IV SOSY
50.0000 mg | PREFILLED_SYRINGE | Freq: Once | INTRAVENOUS | Status: AC
  Administered 2018-05-20: 50 mg via INTRAVENOUS

## 2018-05-20 MED ORDER — LIDOCAINE IN D5W 4-5 MG/ML-% IV SOLN
0.5000 mg/min | INTRAVENOUS | Status: DC
  Administered 2018-05-20: 0.5 mg/min via INTRAVENOUS
  Filled 2018-05-20: qty 500

## 2018-05-20 MED ORDER — LIDOCAINE HCL (CARDIAC) PF 100 MG/5ML IV SOSY
50.0000 mg | PREFILLED_SYRINGE | Freq: Once | INTRAVENOUS | Status: AC
  Administered 2018-05-19: 50 mg via INTRAVENOUS

## 2018-05-20 MED ORDER — FENTANYL CITRATE (PF) 100 MCG/2ML IJ SOLN
INTRAMUSCULAR | Status: AC
  Filled 2018-05-20: qty 4

## 2018-05-20 MED ORDER — POLYETHYLENE GLYCOL 3350 17 G PO PACK
17.0000 g | PACK | Freq: Two times a day (BID) | ORAL | Status: DC
  Administered 2018-05-20 – 2018-05-22 (×5): 17 g via ORAL
  Filled 2018-05-20 (×5): qty 1

## 2018-05-20 MED ORDER — SORBITOL 70 % SOLN
30.0000 mL | Freq: Every day | Status: DC | PRN
  Administered 2018-05-22: 30 mL via ORAL
  Filled 2018-05-20 (×3): qty 30

## 2018-05-20 MED ORDER — MAGNESIUM SULFATE 2 GM/50ML IV SOLN
2.0000 g | Freq: Once | INTRAVENOUS | Status: AC
  Administered 2018-05-20: 2 g via INTRAVENOUS
  Filled 2018-05-20: qty 50

## 2018-05-20 MED ORDER — MIDAZOLAM HCL 2 MG/2ML IJ SOLN
INTRAMUSCULAR | Status: AC
  Filled 2018-05-20: qty 4

## 2018-05-20 MED ORDER — AMIODARONE HCL IN DEXTROSE 360-4.14 MG/200ML-% IV SOLN
60.0000 mg/h | INTRAVENOUS | Status: DC
  Administered 2018-05-20 – 2018-05-21 (×3): 60 mg/h via INTRAVENOUS
  Filled 2018-05-20 (×3): qty 200

## 2018-05-20 MED ORDER — SODIUM CHLORIDE 0.9 % IV BOLUS: 1000.0000 mL | Freq: Once | INTRAVENOUS | Status: DC

## 2018-05-20 NOTE — Progress Notes (Signed)
Given last night's cardiac events we will hold off on performing the CT guided BM Bx at this time.  Pending pt's stability today, may proceed with the procedure tomorrow AM.  Will discuss the pt's status with the clinic team later this PM.  Ronny Bacon, MD Pager #: [phone]

## 2018-05-20 NOTE — Progress Notes (Signed)
Patient ID: Tim Campbell, male   DOB: July 28, 1926, 84 y.o.   MRN: [id number]   Bone Marrow  Bx has been rescheduled to 8/15 am in [place]  New orders In place  We will call for pt 8/15 am RN aware

## 2018-05-20 NOTE — Progress Notes (Addendum)
PROGRESS NOTE    Tim Campbell Space  DSK:[id number] DOB: [birth year] DOA: 05/16/2018 PCP: [name], MD    Brief Narrative:  Patient is 84 year old gentleman history of coronary artery disease status post CABG, status post TAVR, hypothyroidism, hyperlipidemia, history of lymphoma variant been experiencing low back pain had MRI done which showed multiple metastatic lesions patient was subsequently referred to his oncologist by [facility] orthopedic surgeon.  Patient was seen by oncologist due to patient's complaints of generalized weakness he was referred to the ED.  Patient noted to have lower extremity edema.  Patient noted in the ED to have borderline blood pressure with a elevated troponin and EKG showing lateral T wave changes.  Lower extremity edema was concerning to EDP for volume overload and patient received a dose of IV Lasix.  Cardiology was consulted and patient admitted for non-ST elevated MI. Patient also noted to have a UTI and on empiric antibiotics.  Patient seen in consultation by oncology who will follow him evaluate for probable metastatic cancer.  Patient for bone marrow biopsy pending.  In the early hours of 05/20/2018 patient noted to go into sustained symptomatic ventricular tachycardia and transferred to the ICU placed on a lidocaine drip as well as given IV amiodarone.  Patient now on IV amiodarone and lidocaine drip has been discontinued.  Cardiology following.   Assessment & Plan:   Principal Problem:   VT (ventricular tachycardia) (HCC) Active Problems:   Acute on chronic combined systolic and diastolic heart failure (HCC)   S/P TAVR (transcatheter aortic valve replacement)   Thrombocytopenia (HCC)   Hypothyroidism   AAA (abdominal aortic aneurysm) (HCC)   Non-ST elevated myocardial infarction (HCC)   Acute on chronic systolic CHF (congestive heart failure) (HCC)   Acute lower UTI   Bilateral lower extremity edema  #1 symptomatic sustained V. tach Patient noted to  go into ventricular tachycardia overnight with hypotension and diaphoresis. ??  May be likely secondary to non-ST elevated MI noted on admission.  Patient placed on IV lidocaine drip overnight and given IV amiodarone.  Patient still with bouts of V. tach during my assessment.  Patient on amiodarone drip and dose increased per cardiology recommendations.  Lidocaine has been discontinued per cardiology.  Keep magnesium greater than 2.  Keep potassium greater than 4.  Per cardiology due to probable metastatic cancer patient likely not going to be a candidate for AICD placement. Per cardiology  2. non-ST elevated MI/coronary artery disease/cardiomyopathy Patient presented with generalized weakness and back pain with MRI findings in the outpatient setting for metastatic disease.  Patient denies any acute chest pain or shortness of breath on evaluation.  Troponins elevated at 1.73, 1.57, 1.33, 1.30.  2D echo pending.  Due to thrombocytopenia patient not placed on IV heparin.  2D echo with a EF of 30 to 35%.  Cardiology recommended conservative treatment at this time with medical therapy.  Continue aspirin.  Lopressor has been changed to Coreg per cardiology.  Plavix discontinued per cardiology secondary to thrombocytopenia.  Statin resumed.  Cardiology following.    3. BLE edema.??  Acute on chronic CHF exacerbation. Patient with history of cardiomyopathy.  Patient with borderline blood pressure.  Patient given a dose of IV Lasix.  Lower extremity edema may be secondary to cachexia and hypoalbuminemia.  Lower extremity Dopplers ordered and negative for DVT.  2D echo with a EF of 30 to 35%.  Patient denies any shortness of breath.  BNP elevated at 855.2.  Patient with a urine  output of 2.7 L over the past 24 hours.  Patient on low-dose oral Lasix 20 mg daily per cardiology.  Continue low-dose Coreg.  Hold today's dose Lasix due to borderline blood pressure and V. tach overnight..  Cardiology following.   4.   Hypothyroidism TSH at 2.020.  Continue Synthroid.   5.  AAA Outpatient follow-up.  6.  Enterococcus faecalis/Klebsiella pneumonia UTI  Patient with chronic indwelling Foley catheter.  Urine cultures with Enterococcus faecalis and Klebsiella pneumonia.  IV Rocephin has been discontinued and patient started on oral Augmentin( D2/7). Outpatient follow-up.    7.  Metastatic spinal lesions/anemia/thrombocytopenia Patient's oncologist has been informed via epic of patient's admission and patient was sent to the ED by his oncologist.  Plavix has been discontinued.  Patient seen by oncology, Dr. Marin Olp 05/19/2018 CT scans of the chest abdomen and pelvis were done 05/19/2018 showing scattered new sclerotic lesions in the skeleton especially ribs, bony pelvis suspicious for malignancy.  Third spacing of fluid with moderate bilateral effusions, flank edema, mesenteric and presacral edema, small amount of fluid in the paracolic gutters.  Atelectasis obscures region of prior left lower lobe pulmonary nodule.  Notably thickened urinary bladder suspicious for cystitis.  Moderate dilatation of both ureters extending towards the urinary bladder without obstructing stone seen.  3.4 cm infrarenal abdominal aortic aneurysm.  PSA was ordered by oncology and elevated at 155 concerning for probable metastatic prostate cancer.  Bone marrow biopsy helpful to the due to patient's bout of V. tach.  Per oncology.   8.  Generalized weakness Likely secondary to problem #6.  PT/OT.  Will need skilled nursing facility.  Follow.  9.  Hx of TAVR Continue aspirin.  Per cardiology  10.  Hypokalemia/hypomagnesemia Likely secondary to diuresis.  Patient noted to go into V. tach overnight.  Keep magnesium greater than 2.  Keep potassium greater than 4.  Follow.  11.  Bouts of confusion Per daughter patient with small bouts of confusion.  Due to metastatic disease CT head was obtained which was negative for any acute abnormalities.   Monitor.  12.  Abdominal distention likely secondary to constipation Patient movement in several days.  Place on MiraLAX twice daily.  Sorbitol as needed.  Follow.   DVT prophylaxis: SCDs Code Status: Partial Family Communication: Updated patient and daughter at bedside. Disposition Plan: Remain in ICU.     Consultants:   Cardiology: Dr. Aldine Contes 05/17/2018  Oncology: Dr. Marin Olp 05/19/2018  Interventional radiology Dr. Earleen Newport 05/19/2018  Procedures:  Lower extremity Dopplers  05/17/2018  2D echo.  05/17/2018  CT head 05/18/2018  CT chest/CT abdomen and pelvis 05/19/2018  Bone marrow biopsy pending  Antimicrobials:   IV Rocephin 05/17/2018>>>> 05/18/2018  Augmentin 05/18/2018    Subjective: Events overnight noted.  Patient noted to go into V. tach, became hypotensive, per minute.  Patient transferred to the ICU and patient given IV amiodarone and then placed on a lidocaine drip and now on amiodarone drip.  Patient denies any chest pain.  No shortness of breath.  Patient states felt some pain in his back with palpation to the abdomen.  No nausea or emesis.   Objective: Vitals:   05/20/18 0600 05/20/18 0700 05/20/18 0740 05/20/18 0800  BP: 107/70 103/69  (!) 89/63  Pulse: 73 73  (!) 33  Resp: (!) 33 (!) 28  (!) 26  Temp:   97.7 F (36.5 C)   TempSrc:   Oral   SpO2: 100% 100%  99%  Weight:  Height:        Intake/Output Summary (Last 24 hours) at 05/20/2018 0941 Last data filed at 05/20/2018 0800 Gross per 24 hour  Intake 1191.64 ml  Output 1750 ml  Net -558.36 ml   Filed Weights   05/17/18 0428 05/18/18 0405 05/19/18 0457  Weight: 90.6 kg 93.4 kg 93 kg    Examination:  General exam: NAD Respiratory system: Clear to auscultation bilaterally anterior lung fields.  No wheezes, no crackles, no rhonchi.  Normal respiratory effort.  Cardiovascular system: RRR with occasional tachycardia.  No JVD.  There is no lower extremity edema.  Gastrointestinal system:  Abdomen is soft, non-tender, mildly distant bowel sounds.  No rebound.  No guarding.  Central nervous system: Alert and oriented. No focal neurological deficits. Extremities: Symmetric 5 x 5 power. Skin: No rashes, lesions or ulcers Psychiatry: Judgement and insight appear normal. Mood & affect appropriate.     Data Reviewed: I have personally reviewed following labs and imaging studies  CBC: Recent Labs  Lab 05/16/18 1226 05/16/18 1450 05/17/18 0601 05/19/18 0345 05/19/18 0821 05/20/18 0300  WBC 5.3 5.7 5.4 8.4 8.4 8.7  NEUTROABS 4.3 4.4 4.2  --  6.2  --   HGB 11.7* 12.0* 11.1* 11.7* 11.5* 11.5*  HCT 35.4* 35.8* 34.1* 36.6* 36.3* 36.2*  MCV 82.9 81.7 83.4 83.6 84.0 83.8  PLT 78* 75* 56* 58* 54* 77*   Basic Metabolic Panel: Recent Labs  Lab 05/17/18 0006 05/17/18 0601 05/18/18 0404 05/19/18 0345 05/20/18 0008 05/20/18 0300  NA  --  138 136 134* 133* 136  K  --  3.0* 3.8 3.8 3.8 3.8  CL  --  99 98 99 99 101  CO2  --  29 28 28 25 25   GLUCOSE  --  112* 128* 125* 142* 140*  BUN  --  30* 28* 26* 24* 22  CREATININE  --  1.38* 1.22 1.05 0.89 0.90  CALCIUM  --  7.8* 8.0* 8.0* 7.6* 7.9*  MG 1.5*  --  1.9 1.7 1.9 1.9   GFR: Estimated Creatinine Clearance: 63.8 mL/min (by C-G formula based on SCr of 0.9 mg/dL). Liver Function Tests: Recent Labs  Lab 05/16/18 1226 05/16/18 1450 05/17/18 0601  AST 103* 101* 82*  ALT 46* 44 40  ALKPHOS 87 69 65  BILITOT 1.0 1.0 1.0  PROT 5.6* 5.7* 4.7*  ALBUMIN 2.8* 2.9* 2.4*   No results for input(s): LIPASE, AMYLASE in the last 168 hours. No results for input(s): AMMONIA in the last 168 hours. Coagulation Profile: Recent Labs  Lab 05/19/18 0821  INR 1.23   Cardiac Enzymes: Recent Labs  Lab 05/16/18 1814 05/17/18 0006 05/17/18 0601 05/17/18 1145  TROPONINI 1.73* 1.57* 1.33* 1.30*   BNP (last 3 results) No results for input(s): PROBNP in the last 8760 hours. HbA1C: No results for input(s): HGBA1C in the last 72  hours. CBG: Recent Labs  Lab 05/19/18 2325  GLUCAP 150*   Lipid Profile: No results for input(s): CHOL, HDL, LDLCALC, TRIG, CHOLHDL, LDLDIRECT in the last 72 hours. Thyroid Function Tests: No results for input(s): TSH, T4TOTAL, FREET4, T3FREE, THYROIDAB in the last 72 hours. Anemia Panel: No results for input(s): VITAMINB12, FOLATE, FERRITIN, TIBC, IRON, RETICCTPCT in the last 72 hours. Sepsis Labs: Recent Labs  Lab 05/16/18 1555  LATICACIDVEN 1.50    Recent Results (from the past 240 hour(s))  Urine culture     Status: Abnormal   Collection Time: 05/16/18  3:07 PM  Result Value Ref Range  Status   Specimen Description   Final    URINE, RANDOM Performed at [facility], [address] [place], [place] [address]    Special Requests   Final    NONE Performed at [facility], [address] [place], [place] [address]    Culture (A)  Final    >=100,000 COLONIES/mL ENTEROCOCCUS FAECALIS 20,000 COLONIES/mL KLEBSIELLA PNEUMONIAE    Report Status 05/18/2018 FINAL  Final   Organism ID, Bacteria ENTEROCOCCUS FAECALIS (A)  Final   Organism ID, Bacteria KLEBSIELLA PNEUMONIAE (A)  Final      Susceptibility   Enterococcus faecalis - MIC*    AMPICILLIN <=2 SENSITIVE Sensitive     LEVOFLOXACIN 1 SENSITIVE Sensitive     NITROFURANTOIN <=16 SENSITIVE Sensitive     VANCOMYCIN 1 SENSITIVE Sensitive     * >=100,000 COLONIES/mL ENTEROCOCCUS FAECALIS   Klebsiella pneumoniae - MIC*    AMPICILLIN RESISTANT Resistant     CEFAZOLIN <=4 SENSITIVE Sensitive     CEFTRIAXONE <=1 SENSITIVE Sensitive     CIPROFLOXACIN <=0.25 SENSITIVE Sensitive     GENTAMICIN <=1 SENSITIVE Sensitive     IMIPENEM <=0.25 SENSITIVE Sensitive     NITROFURANTOIN 32 SENSITIVE Sensitive     TRIMETH/SULFA <=20 SENSITIVE Sensitive     AMPICILLIN/SULBACTAM 4 SENSITIVE Sensitive     PIP/TAZO <=4 SENSITIVE Sensitive     Extended ESBL NEGATIVE Sensitive     * 20,000 COLONIES/mL KLEBSIELLA  PNEUMONIAE  MRSA PCR Screening     Status: None   Collection Time: 05/17/18  3:15 AM  Result Value Ref Range Status   MRSA by PCR NEGATIVE NEGATIVE Final    Comment:        The GeneXpert MRSA Assay (FDA approved for NASAL specimens only), is one component of a comprehensive MRSA colonization surveillance program. It is not intended to diagnose MRSA infection nor to guide or monitor treatment for MRSA infections. Performed at [facility], [facility] [address], [place], [place] [address]          Radiology Studies: Ct Head Wo Contrast  Result Date: 05/18/2018 CLINICAL DATA:  Metastatic spine tumor suspected EXAM: CT HEAD WITHOUT CONTRAST TECHNIQUE: Contiguous axial images were obtained from the base of the skull through the vertex without intravenous contrast. COMPARISON:  Brain MRI 03/02/2005 FINDINGS: Brain: Bilateral inferior frontal gliosis with pattern suggesting remote contusion, stable since 2006 at least. Partly calcified far anterior right parafalcine mass measuring 13 mm. In retrospect there was meningioma in this location measuring 6 mm in 2006. Mild for age cerebral volume loss. No evidence of acute infarct, hemorrhage, hydrocephalus, or collection. Vascular: Atherosclerotic calcification.  No hyperdense vessel Skull: No acute finding. Sinuses/Orbits: Bilateral cataract resection. IMPRESSION: 1. No acute finding. 2. 13 mm anterior falcine meningioma with mild growth since 2006. 3. Remote bilateral inferior frontal contusion. Electronically Signed   By: Monte Fantasia M.D.   On: 05/18/2018 12:24   Ct Chest W Contrast  Result Date: 05/19/2018 CLINICAL DATA:  Thrombocytopenia.  Past history of lymphoma. EXAM: CT CHEST, ABDOMEN, AND PELVIS WITH CONTRAST TECHNIQUE: Multidetector CT imaging of the chest, abdomen and pelvis was performed following the standard protocol during bolus administration of intravenous contrast. CONTRAST:  157m OMNIPAQUE IOHEXOL 300 MG/ML  SOLN  COMPARISON:  Multiple exams, including chest CT from 01/04/2017 and abdomen CT from 06/06/2016 FINDINGS: CT CHEST FINDINGS Cardiovascular: Coronary, aortic arch, and branch vessel atherosclerotic vascular disease. Aortic valve prosthesis. Prior CABG. Mild cardiomegaly.  Mediastinum/Nodes: Small mediastinal lymph nodes are not pathologically enlarged by size criteria. Lungs/Pleura: Moderate bilateral pleural effusions, nonspecific for transudative or exudative etiology. Associated passive atelectasis noted. The previous left lower lobe pulmonary nodule referenced on the exam from 01/04/2017 is within atelectatic lung and thus completely obscured if still present. Musculoskeletal: Prominent degenerative right glenohumeral arthropathy. Multiple healed bilateral rib fractures. There are new bilateral sclerotic rib lesions of considerable suspicion for malignancy, including a 5.3 cm long area of sclerosis in the left seventh rib anteriorly, among others. Thoracic spondylosis with multilevel bridging spurring anteriorly. CT ABDOMEN PELVIS FINDINGS Hepatobiliary: Mildly contracted gallbladder. There is potentially mild gallbladder wall thickening. No focal hepatic parenchymal lesion is identified. Pancreas: Unremarkable Spleen: Faintly heterogeneous enhancement of the spleen on the later arterial phase images. The spleen measures proximally on 0.9 by 6.6 by 10.1 cm. Delayed images include the lower half of the spleen and appear relatively homogeneous. Adrenals/Urinary Tract: Mild left and borderline right hydronephrosis with moderate bilateral hydroureter extending down to the urinary bladder. Notable wall thickening in the urinary bladder favoring cystitis. A Foley catheter is present with balloon inflated in the urinary bladder. Right renal cysts noted. There is a linear 5 mm left kidney lower pole nonobstructive renal calculus on image 87/6. Mild scarring of the left kidney. Stomach/Bowel: Proximal sigmoid colon  diverticulosis. Vascular/Lymphatic: Aortoiliac atherosclerotic vascular disease. Infrarenal abdominal aortic aneurysm 3.4 cm in diameter, formerly 3.3 Cm. Rim calcified structure in the right abdomen just medial to the SVC on image 57/3, probably a calcified lymph node and less likely to be a small thrombosed aneurysm. Reproductive: Mild prostatomegaly. Other: Increase in diffuse mesenteric and perirenal stranding along with trace fluid in both paracolic gutters. Presacral edema. There is notable edema surrounding the urinary bladder wall. There is also subcutaneous edema along the pubis. Mild flank edema bilaterally. Musculoskeletal: New 1.5 cm sclerotic lesion eccentric to the right in S1 level of the sacrum. New sclerotic lesions in the left ilium in ischium, in the right supra-acetabular region, and in the right ischium. New sclerotic lesion in the left intertrochanteric region measuring about 1.8 cm in diameter. IMPRESSION: 1. Scattered new sclerotic lesions in the skeleton especially in the ribs and bony pelvis, suspicious for malignancy. 2. Notable third spacing of fluid with moderate bilateral pleural effusions, flank edema, mesenteric and presacral edema, and a small amount of fluid in the paracolic gutters. 3. Atelectasis obscures the region of the prior left lower lobe pulmonary nodule. Size of this nodule if it is still present at all unknown. 4. Notably thickened urinary bladder, suspicious for cystitis. Moderate dilation of both ureters extending towards the urinary bladder without obstructive stone seen. 5. 3.4 cm infrarenal abdominal aortic aneurysm. Recommend followup by [place] in 3 years. This recommendation follows ACR consensus guidelines: White Paper of the ACR Incidental Findings Committee II on Vascular Findings. J Am Coll Radiol 2013; 10:789-794. Aortic aneurysm NOS (ICD10-I71.9). 6. Other imaging findings of potential clinical significance: Aortic Atherosclerosis (ICD10-I70.0). Coronary  atherosclerosis. Aortic valve prosthesis. Prior CABG. Mild cardiomegaly. Prominent right glenohumeral arthropathy. Multiple old healed rib fractures. Questionable mild gallbladder wall thickening. Nonobstructive left nephrolithiasis. Proximal sigmoid colon diverticulosis. Mild prostatomegaly. Electronically Signed   By: Van Clines M.D.   On: 05/19/2018 13:35   Ct Abdomen Pelvis W Contrast  Result Date: 05/19/2018 CLINICAL DATA:  Thrombocytopenia.  Past history of lymphoma. EXAM: CT CHEST, ABDOMEN, AND PELVIS WITH CONTRAST TECHNIQUE: Multidetector CT imaging of the chest, abdomen and pelvis was performed following the  standard protocol during bolus administration of intravenous contrast. CONTRAST:  148m OMNIPAQUE IOHEXOL 300 MG/ML  SOLN COMPARISON:  Multiple exams, including chest CT from 01/04/2017 and abdomen CT from 06/06/2016 FINDINGS: CT CHEST FINDINGS Cardiovascular: Coronary, aortic arch, and branch vessel atherosclerotic vascular disease. Aortic valve prosthesis. Prior CABG. Mild cardiomegaly. Mediastinum/Nodes: Small mediastinal lymph nodes are not pathologically enlarged by size criteria. Lungs/Pleura: Moderate bilateral pleural effusions, nonspecific for transudative or exudative etiology. Associated passive atelectasis noted. The previous left lower lobe pulmonary nodule referenced on the exam from 01/04/2017 is within atelectatic lung and thus completely obscured if still present. Musculoskeletal: Prominent degenerative right glenohumeral arthropathy. Multiple healed bilateral rib fractures. There are new bilateral sclerotic rib lesions of considerable suspicion for malignancy, including a 5.3 cm long area of sclerosis in the left seventh rib anteriorly, among others. Thoracic spondylosis with multilevel bridging spurring anteriorly. CT ABDOMEN PELVIS FINDINGS Hepatobiliary: Mildly contracted gallbladder. There is potentially mild gallbladder wall thickening. No focal hepatic parenchymal  lesion is identified. Pancreas: Unremarkable Spleen: Faintly heterogeneous enhancement of the spleen on the later arterial phase images. The spleen measures proximally on 0.9 by 6.6 by 10.1 cm. Delayed images include the lower half of the spleen and appear relatively homogeneous. Adrenals/Urinary Tract: Mild left and borderline right hydronephrosis with moderate bilateral hydroureter extending down to the urinary bladder. Notable wall thickening in the urinary bladder favoring cystitis. A Foley catheter is present with balloon inflated in the urinary bladder. Right renal cysts noted. There is a linear 5 mm left kidney lower pole nonobstructive renal calculus on image 87/6. Mild scarring of the left kidney. Stomach/Bowel: Proximal sigmoid colon diverticulosis. Vascular/Lymphatic: Aortoiliac atherosclerotic vascular disease. Infrarenal abdominal aortic aneurysm 3.4 cm in diameter, formerly 3.3 Cm. Rim calcified structure in the right abdomen just medial to the SVC on image 57/3, probably a calcified lymph node and less likely to be a small thrombosed aneurysm. Reproductive: Mild prostatomegaly. Other: Increase in diffuse mesenteric and perirenal stranding along with trace fluid in both paracolic gutters. Presacral edema. There is notable edema surrounding the urinary bladder wall. There is also subcutaneous edema along the pubis. Mild flank edema bilaterally. Musculoskeletal: New 1.5 cm sclerotic lesion eccentric to the right in S1 level of the sacrum. New sclerotic lesions in the left ilium in ischium, in the right supra-acetabular region, and in the right ischium. New sclerotic lesion in the left intertrochanteric region measuring about 1.8 cm in diameter. IMPRESSION: 1. Scattered new sclerotic lesions in the skeleton especially in the ribs and bony pelvis, suspicious for malignancy. 2. Notable third spacing of fluid with moderate bilateral pleural effusions, flank edema, mesenteric and presacral edema, and a small  amount of fluid in the paracolic gutters. 3. Atelectasis obscures the region of the prior left lower lobe pulmonary nodule. Size of this nodule if it is still present at all unknown. 4. Notably thickened urinary bladder, suspicious for cystitis. Moderate dilation of both ureters extending towards the urinary bladder without obstructive stone seen. 5. 3.4 cm infrarenal abdominal aortic aneurysm. Recommend followup by [place] 3 years. This recommendation follows ACR consensus guidelines: White Paper of the ACR Incidental Findings Committee II on Vascular Findings. J Am Coll Radiol 2013; 10:789-794. Aortic aneurysm NOS (ICD10-I71.9). 6. Other imaging findings of potential clinical significance: Aortic Atherosclerosis (ICD10-I70.0). Coronary atherosclerosis. Aortic valve prosthesis. Prior CABG. Mild cardiomegaly. Prominent right glenohumeral arthropathy. Multiple old healed rib fractures. Questionable mild gallbladder wall thickening. Nonobstructive left nephrolithiasis. Proximal sigmoid colon diverticulosis. Mild prostatomegaly. Electronically Signed  By: Van Clines M.D.   On: 05/19/2018 13:35        Scheduled Meds: . acetaminophen  500 mg Oral TID  . amiodarone  150 mg Intravenous Once  . amoxicillin-clavulanate  1 tablet Oral Q12H  . aspirin EC  81 mg Oral Daily  . atorvastatin  40 mg Oral q1800  . carvedilol  3.125 mg Oral BID WC  . dronabinol  2.5 mg Oral TID AC  . folic acid  1 mg Oral Once per day on Mon Wed Fri  . furosemide  20 mg Oral Daily  . levothyroxine  125 mcg Oral QAC breakfast  . lidocaine      . pneumococcal 23 valent vaccine  0.5 mL Intramuscular Tomorrow-1000  . potassium chloride  40 mEq Oral Once  . tamsulosin  0.4 mg Oral Daily   Continuous Infusions: . amiodarone Stopped (05/20/18 0439)  . amiodarone 30 mg/hr (05/20/18 0615)  . sodium chloride       LOS: 4 days    Time spent: 40 minutes    Irine Seal, MD [facility] Pager [phone]  [phone]  If 7PM-7AM, please contact night-coverage [email or web address] Password [facility] 05/20/2018, 9:41 AM

## 2018-05-20 NOTE — Consult Note (Signed)
            [facility] CM Primary Care Navigator  05/20/2018  Tim Campbell [birth year] [id number]   Attempt to see patient at the bedside to identify possible discharge needs but he was transferred to cardiac ICU 2H 15 from [place] 12 due to symptomatic ventricular tachycardia.  Will try to see patient at another time when he is already out of ICU.    For additional questions please contact:  Edwena Felty A. Gerturde Kuba, BSN, RN-BC [facility] PRIMARY CARE Navigator Cell: [phone]

## 2018-05-20 NOTE — Progress Notes (Signed)
RRRN paged NP because pt was in sustained VTach. Non symptomatic except for diaphoresis and hypotension. NP to room stat.  S: pt denies palpitations, SOB, chest pain, or dizziness. Never had this before. O: Chronically ill, but surprisingly well appearing elderly WM in NAD. Sustained VT on tele. Afebrile. BP 80-90. Alert and oriented. HOH. No increased respiratory effort noted. Still diaphoretic.  A/P: 1. Sustained VT-cardiology was called and came to room within a couple of minutes. Amiodarone given and drip hung. Lidocaine boluses ordered and with continued long runs of VT, Lidocaine drip hung. Bolus of NS for BP. Improved after meds and only a couple of beats of VT noted on tele. Mg and BMP ordered. Pt transferred to Cardiac ICU for closer monitoring and because of Lido drip. Cardiology taking over care as primary now given transfer to ICU.   Total critical care time: 60 minutes Critical care time was exclusive of separately billable procedures and treating other patients. Critical care was necessary to treat or prevent imminent or life-threatening deterioration. Critical care was time spent personally by me on the following activities: development of treatment plan with patient and/or surrogate as well as nursing, discussions with consultants, evaluation of patient's response to treatment, examination of patient, obtaining history from patient or surrogate, ordering and performing treatments and interventions, ordering and review of laboratory studies, ordering and review of radiographic studies, pulse oximetry and re-evaluation of patient's condition. KJKG, NP [facility]   .

## 2018-05-20 NOTE — Significant Event (Signed)
Overnight Cardiology Event Note  Tim Simson Spainhouris a 84 y.o.malewith a history of CAD s/p 5V-CABG in 1993 with subsequent stenting to grafts (BMS to prox VG-PDA in 2012, BMS to SVG-OM in 03/2014), severe AS s/p TAVR in 06/2016 with 29 mm Sapien 3 valve, HFrEF with last known EF 40-45%, HTN, RBBB, HLD, hypothyroidism, large cell non-Hodgkins lympoma, BPH s/p thallium laser ablation of his prostate on 12/05/16 with indwelling catheter who presents from OSH with troponinemia in the setting of generalized weakness, leg swelling and hypotension.   Developed sustained monomorphic ventricular tachycardia that was asymptomatic but resulting in relative hypotension (MAPs dropped into 40s) and diaphoresis  IV amiodarone 300 mg administered; patient continued to have intermittent 20-30 second runs of VT.   Intravenous infusion of amiodarone initiated following amiodarone bolus.   IV lidocaine administered 50 mg bolus x 3, followed by up-titration to 1.5 mg/min infusion.   12-lead electrocardiogram performed during NSR: no acute ST/T changes and patient continues to deny chest pain.   Due to need for intravenous lidocaine and intravenous amiodarone, transferred to intensive care unit. Suspect after further amiodarone loading we may be able to discontinue lidocaine infusion.   K and Mg supplemented  Charlaine Dalton, MD

## 2018-05-20 NOTE — Significant Event (Signed)
Rapid Response Event Note  Overview: Cardiac - Sustained VT  Initial Focused Assessment: While on 6E, staff informed me that patient was having bursts of NSVT, several bursts had occurred and patient remained asymptomatic. Around 2320 patient had sustained VT, HR got as high 190s, I walked in and patient was completely diaphoretic, clammy, and gray in color. Patient denied having any symptoms but his clinical presentation did not look well. SBP was holding initially. TRH NP was paged, NP came to be bedside STAT and CARDS MD on call also came at 2335. Over the course of the episode, patient did became hypotensive but improved BP with fluids.  Interventions: - Placed on Zoll Defib. - AMIO 300 IVP then Amiodarone infusion was started at 60mg /hr - Lidocaine 50mg  IVP then Lidocaine infusion was started. Patient was given two Lidocaine 50mg  boluses and drip was titrated up to 1.5mg /min. - 1L NS bolus total was given - STAT Labs ordered  Plan of Care: - Transferred to 2M33  Event Summary:   at    [facility] Time 2320 End Time [place], [place]

## 2018-05-20 NOTE — Progress Notes (Addendum)
Progress Note  Patient Name: Tim Campbell Date of Encounter: 05/20/2018  Primary Cardiologist: Dr Smith/Dr Angelena Form  Subjective   Events noted overnight, including sustained monomorphic VT. Given IV amiodarone and infusion followed by lidocaine bolus and infusion. Did not require defibrillation. K and Mg supplemented, but remain slightly low today (3.8 and 1.9 respectively).   Inpatient Medications    Scheduled Meds: . acetaminophen  500 mg Oral TID  . amiodarone  150 mg Intravenous Once  . amoxicillin-clavulanate  1 tablet Oral Q12H  . aspirin EC  81 mg Oral Daily  . atorvastatin  40 mg Oral q1800  . carvedilol  3.125 mg Oral BID WC  . dronabinol  2.5 mg Oral TID AC  . folic acid  1 mg Oral Once per day on Mon Wed Fri  . furosemide  20 mg Oral Daily  . levothyroxine  125 mcg Oral QAC breakfast  . lidocaine      . pneumococcal 23 valent vaccine  0.5 mL Intramuscular Tomorrow-1000  . potassium chloride  40 mEq Oral Once  . tamsulosin  0.4 mg Oral Daily   Continuous Infusions: . amiodarone Stopped (05/20/18 0439)  . amiodarone 30 mg/hr (05/20/18 0615)  . lidocaine 1.5 mg/min (05/20/18 0800)  . sodium chloride     PRN Meds: acetaminophen **OR** acetaminophen, nitroGLYCERIN, ondansetron **OR** ondansetron (ZOFRAN) IV, oxyCODONE, tiZANidine   Vital Signs    Vitals:   05/20/18 0600 05/20/18 0700 05/20/18 0740 05/20/18 0800  BP: 107/70 103/69  (!) 89/63  Pulse: 73 73  (!) 33  Resp: (!) 33 (!) 28  (!) 26  Temp:   97.7 F (36.5 C)   TempSrc:   Oral   SpO2: 100% 100%  99%  Weight:      Height:        Intake/Output Summary (Last 24 hours) at 05/20/2018 0852 Last data filed at 05/20/2018 0800 Gross per 24 hour  Intake 1311.64 ml  Output 2700 ml  Net -1388.36 ml   Filed Weights   05/17/18 0428 05/18/18 0405 05/19/18 0457  Weight: 90.6 kg 93.4 kg 93 kg    Telemetry    Monomorphic VT noted yesterday - overnight, occasional PVC's - personally  reviewed  Physical Exam   General appearance: alert and no distress Neck: no carotid bruit, no JVD and thyroid not enlarged, symmetric, no tenderness/mass/nodules Lungs: clear to auscultation bilaterally Heart: regular rate and rhythm and systolic murmur: early systolic 2/6, crescendo at 2nd right intercostal space Abdomen: soft, non-tender; bowel sounds normal; no masses,  no organomegaly Extremities: extremities normal, atraumatic, no cyanosis or edema Pulses: 2+ and symmetric Skin: Skin color, texture, turgor normal. No rashes or lesions Neurologic: Mental status: Alert, oriented, thought content appropriate Psych: Pleasant   Labs    Chemistry Recent Labs  Lab 05/16/18 1226  05/16/18 1450 05/17/18 0601  05/19/18 0345 05/20/18 0008 05/20/18 0300  NA 135  --  135 138   < > 134* 133* 136  K 3.7  --  3.3* 3.0*   < > 3.8 3.8 3.8  CL 99  --  98 99   < > 99 99 101  CO2 25  --  27 29   < > 28 25 25   GLUCOSE 112*  --  132* 112*   < > 125* 142* 140*  BUN 34*  --  36* 30*   < > 26* 24* 22  CREATININE 1.16   < > 1.33* 1.38*   < > 1.05  0.89 0.90  CALCIUM 8.0*  --  7.9* 7.8*   < > 8.0* 7.6* 7.9*  PROT 5.6*  --  5.7* 4.7*  --   --   --   --   ALBUMIN 2.8*  --  2.9* 2.4*  --   --   --   --   AST 103*  --  101* 82*  --   --   --   --   ALT 46*  --  44 40  --   --   --   --   ALKPHOS 87  --  69 65  --   --   --   --   BILITOT 1.0  --  1.0 1.0  --   --   --   --   GFRNONAA 54*   < > 46* 44*   < > >60 >60 >60  GFRAA >60   < > 53* 51*   < > >60 >60 >60  ANIONGAP 11  --  10 10   < > 7 9 10    < > = values in this interval not displayed.     Hematology Recent Labs  Lab 05/19/18 0345 05/19/18 0821 05/20/18 0300  WBC 8.4 8.4 8.7  RBC 4.38 4.32 4.32  HGB 11.7* 11.5* 11.5*  HCT 36.6* 36.3* 36.2*  MCV 83.6 84.0 83.8  MCH 26.7 26.6 26.6  MCHC 32.0 31.7 31.8  RDW 15.4 15.5 15.6*  PLT 58* 54* 77*    Cardiac Enzymes Recent Labs  Lab 05/16/18 1814 05/17/18 0006 05/17/18 0601  05/17/18 1145  TROPONINI 1.73* 1.57* 1.33* 1.30*    BNP Recent Labs  Lab 05/16/18 1450  BNP 855.2*     Radiology    Ct Head Wo Contrast  Result Date: 05/18/2018 CLINICAL DATA:  Metastatic spine tumor suspected EXAM: CT HEAD WITHOUT CONTRAST TECHNIQUE: Contiguous axial images were obtained from the base of the skull through the vertex without intravenous contrast. COMPARISON:  Brain MRI 03/02/2005 FINDINGS: Brain: Bilateral inferior frontal gliosis with pattern suggesting remote contusion, stable since 2006 at least. Partly calcified far anterior right parafalcine mass measuring 13 mm. In retrospect there was meningioma in this location measuring 6 mm in 2006. Mild for age cerebral volume loss. No evidence of acute infarct, hemorrhage, hydrocephalus, or collection. Vascular: Atherosclerotic calcification.  No hyperdense vessel Skull: No acute finding. Sinuses/Orbits: Bilateral cataract resection. IMPRESSION: 1. No acute finding. 2. 13 mm anterior falcine meningioma with mild growth since 2006. 3. Remote bilateral inferior frontal contusion. Electronically Signed   By: Monte Fantasia M.D.   On: 05/18/2018 12:24   Ct Chest W Contrast  Result Date: 05/19/2018 CLINICAL DATA:  Thrombocytopenia.  Past history of lymphoma. EXAM: CT CHEST, ABDOMEN, AND PELVIS WITH CONTRAST TECHNIQUE: Multidetector CT imaging of the chest, abdomen and pelvis was performed following the standard protocol during bolus administration of intravenous contrast. CONTRAST:  163mL OMNIPAQUE IOHEXOL 300 MG/ML  SOLN COMPARISON:  Multiple exams, including chest CT from 01/04/2017 and abdomen CT from 06/06/2016 FINDINGS: CT CHEST FINDINGS Cardiovascular: Coronary, aortic arch, and branch vessel atherosclerotic vascular disease. Aortic valve prosthesis. Prior CABG. Mild cardiomegaly. Mediastinum/Nodes: Small mediastinal lymph nodes are not pathologically enlarged by size criteria. Lungs/Pleura: Moderate bilateral pleural effusions,  nonspecific for transudative or exudative etiology. Associated passive atelectasis noted. The previous left lower lobe pulmonary nodule referenced on the exam from 01/04/2017 is within atelectatic lung and thus completely obscured if still present. Musculoskeletal: Prominent degenerative right glenohumeral arthropathy. Multiple  healed bilateral rib fractures. There are new bilateral sclerotic rib lesions of considerable suspicion for malignancy, including a 5.3 cm long area of sclerosis in the left seventh rib anteriorly, among others. Thoracic spondylosis with multilevel bridging spurring anteriorly. CT ABDOMEN PELVIS FINDINGS Hepatobiliary: Mildly contracted gallbladder. There is potentially mild gallbladder wall thickening. No focal hepatic parenchymal lesion is identified. Pancreas: Unremarkable Spleen: Faintly heterogeneous enhancement of the spleen on the later arterial phase images. The spleen measures proximally on 0.9 by 6.6 by 10.1 cm. Delayed images include the lower half of the spleen and appear relatively homogeneous. Adrenals/Urinary Tract: Mild left and borderline right hydronephrosis with moderate bilateral hydroureter extending down to the urinary bladder. Notable wall thickening in the urinary bladder favoring cystitis. A Foley catheter is present with balloon inflated in the urinary bladder. Right renal cysts noted. There is a linear 5 mm left kidney lower pole nonobstructive renal calculus on image 87/6. Mild scarring of the left kidney. Stomach/Bowel: Proximal sigmoid colon diverticulosis. Vascular/Lymphatic: Aortoiliac atherosclerotic vascular disease. Infrarenal abdominal aortic aneurysm 3.4 cm in diameter, formerly 3.3 Cm. Rim calcified structure in the right abdomen just medial to the SVC on image 57/3, probably a calcified lymph node and less likely to be a small thrombosed aneurysm. Reproductive: Mild prostatomegaly. Other: Increase in diffuse mesenteric and perirenal stranding along with  trace fluid in both paracolic gutters. Presacral edema. There is notable edema surrounding the urinary bladder wall. There is also subcutaneous edema along the pubis. Mild flank edema bilaterally. Musculoskeletal: New 1.5 cm sclerotic lesion eccentric to the right in S1 level of the sacrum. New sclerotic lesions in the left ilium in ischium, in the right supra-acetabular region, and in the right ischium. New sclerotic lesion in the left intertrochanteric region measuring about 1.8 cm in diameter. IMPRESSION: 1. Scattered new sclerotic lesions in the skeleton especially in the ribs and bony pelvis, suspicious for malignancy. 2. Notable third spacing of fluid with moderate bilateral pleural effusions, flank edema, mesenteric and presacral edema, and a small amount of fluid in the paracolic gutters. 3. Atelectasis obscures the region of the prior left lower lobe pulmonary nodule. Size of this nodule if it is still present at all unknown. 4. Notably thickened urinary bladder, suspicious for cystitis. Moderate dilation of both ureters extending towards the urinary bladder without obstructive stone seen. 5. 3.4 cm infrarenal abdominal aortic aneurysm. Recommend followup by [place] in 3 years. This recommendation follows ACR consensus guidelines: White Paper of the ACR Incidental Findings Committee II on Vascular Findings. J Am Coll Radiol 2013; 10:789-794. Aortic aneurysm NOS (ICD10-I71.9). 6. Other imaging findings of potential clinical significance: Aortic Atherosclerosis (ICD10-I70.0). Coronary atherosclerosis. Aortic valve prosthesis. Prior CABG. Mild cardiomegaly. Prominent right glenohumeral arthropathy. Multiple old healed rib fractures. Questionable mild gallbladder wall thickening. Nonobstructive left nephrolithiasis. Proximal sigmoid colon diverticulosis. Mild prostatomegaly. Electronically Signed   By: Van Clines M.D.   On: 05/19/2018 13:35   Ct Abdomen Pelvis W Contrast  Result Date: 05/19/2018 CLINICAL  DATA:  Thrombocytopenia.  Past history of lymphoma. EXAM: CT CHEST, ABDOMEN, AND PELVIS WITH CONTRAST TECHNIQUE: Multidetector CT imaging of the chest, abdomen and pelvis was performed following the standard protocol during bolus administration of intravenous contrast. CONTRAST:  138mL OMNIPAQUE IOHEXOL 300 MG/ML  SOLN COMPARISON:  Multiple exams, including chest CT from 01/04/2017 and abdomen CT from 06/06/2016 FINDINGS: CT CHEST FINDINGS Cardiovascular: Coronary, aortic arch, and branch vessel atherosclerotic vascular disease. Aortic valve prosthesis. Prior CABG. Mild cardiomegaly. Mediastinum/Nodes: Small mediastinal lymph nodes are  not pathologically enlarged by size criteria. Lungs/Pleura: Moderate bilateral pleural effusions, nonspecific for transudative or exudative etiology. Associated passive atelectasis noted. The previous left lower lobe pulmonary nodule referenced on the exam from 01/04/2017 is within atelectatic lung and thus completely obscured if still present. Musculoskeletal: Prominent degenerative right glenohumeral arthropathy. Multiple healed bilateral rib fractures. There are new bilateral sclerotic rib lesions of considerable suspicion for malignancy, including a 5.3 cm long area of sclerosis in the left seventh rib anteriorly, among others. Thoracic spondylosis with multilevel bridging spurring anteriorly. CT ABDOMEN PELVIS FINDINGS Hepatobiliary: Mildly contracted gallbladder. There is potentially mild gallbladder wall thickening. No focal hepatic parenchymal lesion is identified. Pancreas: Unremarkable Spleen: Faintly heterogeneous enhancement of the spleen on the later arterial phase images. The spleen measures proximally on 0.9 by 6.6 by 10.1 cm. Delayed images include the lower half of the spleen and appear relatively homogeneous. Adrenals/Urinary Tract: Mild left and borderline right hydronephrosis with moderate bilateral hydroureter extending down to the urinary bladder. Notable wall  thickening in the urinary bladder favoring cystitis. A Foley catheter is present with balloon inflated in the urinary bladder. Right renal cysts noted. There is a linear 5 mm left kidney lower pole nonobstructive renal calculus on image 87/6. Mild scarring of the left kidney. Stomach/Bowel: Proximal sigmoid colon diverticulosis. Vascular/Lymphatic: Aortoiliac atherosclerotic vascular disease. Infrarenal abdominal aortic aneurysm 3.4 cm in diameter, formerly 3.3 Cm. Rim calcified structure in the right abdomen just medial to the SVC on image 57/3, probably a calcified lymph node and less likely to be a small thrombosed aneurysm. Reproductive: Mild prostatomegaly. Other: Increase in diffuse mesenteric and perirenal stranding along with trace fluid in both paracolic gutters. Presacral edema. There is notable edema surrounding the urinary bladder wall. There is also subcutaneous edema along the pubis. Mild flank edema bilaterally. Musculoskeletal: New 1.5 cm sclerotic lesion eccentric to the right in S1 level of the sacrum. New sclerotic lesions in the left ilium in ischium, in the right supra-acetabular region, and in the right ischium. New sclerotic lesion in the left intertrochanteric region measuring about 1.8 cm in diameter. IMPRESSION: 1. Scattered new sclerotic lesions in the skeleton especially in the ribs and bony pelvis, suspicious for malignancy. 2. Notable third spacing of fluid with moderate bilateral pleural effusions, flank edema, mesenteric and presacral edema, and a small amount of fluid in the paracolic gutters. 3. Atelectasis obscures the region of the prior left lower lobe pulmonary nodule. Size of this nodule if it is still present at all unknown. 4. Notably thickened urinary bladder, suspicious for cystitis. Moderate dilation of both ureters extending towards the urinary bladder without obstructive stone seen. 5. 3.4 cm infrarenal abdominal aortic aneurysm. Recommend followup by [place] in 3 years. This  recommendation follows ACR consensus guidelines: White Paper of the ACR Incidental Findings Committee II on Vascular Findings. J Am Coll Radiol 2013; 10:789-794. Aortic aneurysm NOS (ICD10-I71.9). 6. Other imaging findings of potential clinical significance: Aortic Atherosclerosis (ICD10-I70.0). Coronary atherosclerosis. Aortic valve prosthesis. Prior CABG. Mild cardiomegaly. Prominent right glenohumeral arthropathy. Multiple old healed rib fractures. Questionable mild gallbladder wall thickening. Nonobstructive left nephrolithiasis. Proximal sigmoid colon diverticulosis. Mild prostatomegaly. Electronically Signed   By: Van Clines M.D.   On: 05/19/2018 13:35    Patient Profile     CURVIN HUNGER is a 84 y.o. male with a history of CAD s/p 5V-CABG in 1993 with subsequent stenting to grafts (BMS to prox VG-PDA in 2012, BMS to SVG-OM in 03/2014), severe AS s/p TAVR in 06/2016  with 29 mm Sapien 3 valve, HFrEF with last known EF 40-45%, HTN, RBBB, HLD, hypothyroidism, large cell non-Hodgkins lympoma, BPH s/p thallium laser ablation of his prostate on 12/05/16 with indwelling catheter who presents from OSH with troponinemia in the setting of generalized weakness, leg swelling and hypotension. Of note, has a recent MRI lumbar spine demonstrating multifocal bone lesions most consistent with metastatic carcinoma.   Assessment & Plan    1 elevated troponin-patient denies chest pain.  There is no clear trend. Findings consistent with cardiomyopathy. No plans for cath.  2 cardiomyopathy-echocardiogram shows ejection fraction 30 to 35%.  We will continue with beta-blockade but changed to carvedilol 3.125 mg twice daily.  BP will not allow addition of ACEI/ARB/ARNI. On oral lasix 20 mg daily, staying net negative.  3 coronary artery disease-continue aspirin and restart statin.  4 MRI suggestive of metastatic carcinoma-further evaluation per primary care.  5 prior aortic valve replacement-stable on  echocardiogram.  6 thrombocytopenia-we will continue aspirin 81 mg daily for coronary disease and prior aortic valve replacement but discontinue Plavix.  7. Sustained VT - appropriately treated with amiodarone and lidocaine overnight. Will d/c lidocaine today and maintain amiodarone infusion. Given probably metastatic cancer, suspect he will not likely be an AICD candidate. I discussed the case with EP and they would not recommend AICD given age and probable metastatic cancer.  Pixie Casino, MD, [facility], [place] Director of the Advanced Lipid Disorders &  Cardiovascular Risk Reduction Clinic Diplomate of the American Board of Clinical Lipidology Attending Cardiologist  Direct Dial: [phone]  Fax: [phone]  Website:  [email or web address]  Pixie Casino, MD  05/20/2018, 8:52 AM

## 2018-05-20 NOTE — Progress Notes (Signed)
Tim Campbell is now down in the cardiac ICU.  He apparently had sustained V. tach yesterday.  He had some diaphoresis.  He had some hypotension.  He said he had no chest pain.  Cardiology is monitoring this closely.  He is on amiodarone.  He is on lidocaine drip.  We did a CT scan yesterday.  It showed some sclerotic bone lesions.  There is no lymphadenopathy.  I have a feeling that we are going to end up dealing with metastatic prostate cancer.  I will double check the PSA level.  I think a bone marrow biopsy still will be helpful given his thrombocytopenia.  We can see if he has infiltration of the bone marrow by prostate cancer.  I will get a bone scan on him.  He says he feels okay.  He is eating better.  His labs today show potassium of 3.8.  His sodium is 136.  His creatinine 0.9.  Calcium 7.9.  His CBC showed a white cell count of 8.7.  Hemoglobin 11.5.  Platelet count 77,000.  He says is not hurting.  He has had no fever.  I think that his cardiac status is clearly much more important right now than his underlying malignancy.  I am not sure a bone marrow test will be done today.  Given the cardiac issues, I might be reasonable to hold off on a bone marrow test for a day or so.  Again, we will see what a bone scan shows.  We will see what is PSA is.  I will check a testosterone level on him.  If there is a concern for metastatic prostate cancer, then we will proactively treat him.  I would give him something to ablate his testicular testosterone production.  I appreciate everybody's help in trying to get Tim Campbell better.   Lattie Haw, MD  [place] 12:12

## 2018-05-20 NOTE — Progress Notes (Signed)
PT Cancellation Note  Patient Details Name: Tim Campbell MRN: [id number] DOB: Aug 10, 1926   Cancelled Treatment:    Reason Eval/Treat Not Completed: Medical issues which prohibited therapy(pt with transition to ICU due to [place] and RN request hold today. )   Baneza Bartoszek B Kelson Queenan 05/20/2018, 11:52 AM  Elwyn Reach, [place]

## 2018-05-21 ENCOUNTER — Inpatient Hospital Stay (HOSPITAL_COMMUNITY): Payer: Medicare Other

## 2018-05-21 DIAGNOSIS — N390 Urinary tract infection, site not specified: Secondary | ICD-10-CM

## 2018-05-21 DIAGNOSIS — E039 Hypothyroidism, unspecified: Secondary | ICD-10-CM

## 2018-05-21 DIAGNOSIS — I714 Abdominal aortic aneurysm, without rupture: Secondary | ICD-10-CM

## 2018-05-21 LAB — CBC WITH DIFFERENTIAL/PLATELET
BASOS ABS: 0 10*3/uL (ref 0.0–0.1)
Basophils Relative: 0 %
EOS PCT: 0 %
Eosinophils Absolute: 0 10*3/uL (ref 0.0–0.7)
HEMATOCRIT: 37 % — AB (ref 39.0–52.0)
Hemoglobin: 11.6 g/dL — ABNORMAL LOW (ref 13.0–17.0)
Lymphocytes Relative: 17 %
Lymphs Abs: 1.7 10*3/uL (ref 0.7–4.0)
MCH: 26.3 pg (ref 26.0–34.0)
MCHC: 31.4 g/dL (ref 30.0–36.0)
MCV: 83.9 fL (ref 78.0–100.0)
Monocytes Absolute: 0.8 10*3/uL (ref 0.1–1.0)
Monocytes Relative: 8 %
NEUTROS PCT: 75 %
Neutro Abs: 7.4 10*3/uL (ref 1.7–7.7)
PLATELETS: 99 10*3/uL — AB (ref 150–400)
RBC: 4.41 MIL/uL (ref 4.22–5.81)
RDW: 15.7 % — ABNORMAL HIGH (ref 11.5–15.5)
WBC: 9.9 10*3/uL (ref 4.0–10.5)

## 2018-05-21 LAB — COMPREHENSIVE METABOLIC PANEL
ALT: 38 U/L (ref 0–44)
ANION GAP: 7 (ref 5–15)
AST: 48 U/L — AB (ref 15–41)
Albumin: 2.1 g/dL — ABNORMAL LOW (ref 3.5–5.0)
Alkaline Phosphatase: 73 U/L (ref 38–126)
BUN: 24 mg/dL — ABNORMAL HIGH (ref 8–23)
CHLORIDE: 99 mmol/L (ref 98–111)
CO2: 29 mmol/L (ref 22–32)
Calcium: 8.3 mg/dL — ABNORMAL LOW (ref 8.9–10.3)
Creatinine, Ser: 1.05 mg/dL (ref 0.61–1.24)
Glucose, Bld: 139 mg/dL — ABNORMAL HIGH (ref 70–99)
Potassium: 5.5 mmol/L — ABNORMAL HIGH (ref 3.5–5.1)
Sodium: 135 mmol/L (ref 135–145)
Total Bilirubin: 0.8 mg/dL (ref 0.3–1.2)
Total Protein: 4.9 g/dL — ABNORMAL LOW (ref 6.5–8.1)

## 2018-05-21 LAB — MAGNESIUM: MAGNESIUM: 2 mg/dL (ref 1.7–2.4)

## 2018-05-21 MED ORDER — PATIROMER SORBITEX CALCIUM 8.4 G PO PACK
8.4000 g | PACK | Freq: Once | ORAL | Status: AC
  Administered 2018-05-21: 8.4 g via ORAL
  Filled 2018-05-21: qty 1

## 2018-05-21 MED ORDER — FUROSEMIDE 40 MG PO TABS
40.0000 mg | ORAL_TABLET | Freq: Every day | ORAL | Status: DC
  Administered 2018-05-21 – 2018-05-23 (×3): 40 mg via ORAL
  Filled 2018-05-21 (×3): qty 1

## 2018-05-21 MED ORDER — AMIODARONE HCL 200 MG PO TABS
400.0000 mg | ORAL_TABLET | Freq: Every day | ORAL | Status: DC
  Administered 2018-05-21 – 2018-05-23 (×3): 400 mg via ORAL
  Filled 2018-05-21 (×3): qty 2

## 2018-05-21 MED ORDER — TECHNETIUM TC 99M MEDRONATE IV KIT
20.0000 | PACK | Freq: Once | INTRAVENOUS | Status: AC | PRN
  Administered 2018-05-21: 20 via INTRAVENOUS

## 2018-05-21 MED FILL — Medication: Qty: 1 | Status: AC

## 2018-05-21 NOTE — Progress Notes (Signed)
Physical Therapy Treatment Patient Details Name: Tim Campbell MRN: [id number] DOB: 07/17/1926 Today's Date: 05/21/2018    History of Present Illness 84 y.o. male admitted to ED by referral from oncologist for generalized weakness. 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 PMH includes. Non Hodgkins lymphoma, TAVR, CABG, peripheral neuropathy, OA, AAA, CAD, R ankle ORF, L distal radial fx.     PT Comments    Patient progressing well with therapy today, ambulating 10' with close chair follow for safety, improving gait mechanics. No vtach, SpO2 remains around 90% on RA during session. SNF recs still appropriate.     Follow Up Recommendations  SNF;Supervision/Assistance - 24 hour     Equipment Recommendations  (TBD next venue)    Recommendations for Other Services       Precautions / Restrictions Precautions Precautions: Fall Restrictions Weight Bearing Restrictions: No    Mobility  Bed Mobility Overal bed mobility: Needs Assistance Bed Mobility: Supine to Sit     Supine to sit: Max assist;Min assist     General bed mobility comments: min A today x2  Transfers Overall transfer level: Needs assistance Equipment used: Rolling walker (2 wheeled) Transfers: Sit to/from Stand Sit to Stand: Mod assist         General transfer comment: Mod A x 2 to stand, cues for hand placement on RW  Ambulation/Gait Ambulation/Gait assistance: Min assist Gait Distance (Feet): 10 Feet Assistive device: Rolling walker (2 wheeled) Gait Pattern/deviations: Step-to pattern Gait velocity: decreased   General Gait Details: pt with improving hip flexion L leg, walked outside room through doorway then required a seat.    Stairs             Wheelchair Mobility    Modified Rankin (Stroke Patients Only)       Balance Overall balance assessment: Needs assistance Sitting-balance support: No upper extremity supported;Feet supported Sitting  balance-Leahy Scale: Fair     Standing balance support: Bilateral upper extremity supported;During functional activity Standing balance-Leahy Scale: Poor                              Cognition Arousal/Alertness: Awake/alert Behavior During Therapy: WFL for tasks assessed/performed Overall Cognitive Status: Within Functional Limits for tasks assessed                                        Exercises      General Comments        Pertinent Vitals/Pain Pain Assessment: Faces Faces Pain Scale: Hurts a little bit Pain Location: low back Pain Descriptors / Indicators: Aching;Discomfort Pain Intervention(s): Limited activity within patient's tolerance;Monitored during session;Premedicated before session    Home Living                      Prior Function            PT Goals (current goals can now be found in the care plan section) Acute Rehab PT Goals Patient Stated Goal: get stronger PT Goal Formulation: With patient/family Time For Goal Achievement: 05/25/18 Potential to Achieve Goals: Fair Progress towards PT goals: Progressing toward goals    Frequency    Min 3X/week      PT Plan Current plan remains appropriate    Co-evaluation PT/OT/SLP Co-Evaluation/Treatment: Yes Reason for Co-Treatment: For patient/therapist safety  AM-PAC PT "6 Clicks" Daily Activity  Outcome Measure  Difficulty turning over in bed (including adjusting bedclothes, sheets and blankets)?: Unable Difficulty moving from lying on back to sitting on the side of the bed? : Unable Difficulty sitting down on and standing up from a chair with arms (e.g., wheelchair, bedside commode, etc,.)?: Unable Help needed moving to and from a bed to chair (including a wheelchair)?: A Lot Help needed walking in hospital room?: A Lot Help needed climbing 3-5 steps with a railing? : Total 6 Click Score: 8    End of Session Equipment Utilized During Treatment:  Gait belt Activity Tolerance: Patient tolerated treatment well Patient left: in chair;with call bell/phone within reach;with family/visitor present Nurse Communication: Mobility status PT Visit Diagnosis: Unsteadiness on feet (R26.81);Muscle weakness (generalized) (M62.81);Difficulty in walking, not elsewhere classified (R26.2)     Time: 3358-2518 PT Time Calculation (min) (ACUTE ONLY): 30 min  Charges:  $Gait Training: 8-22 mins                     Reinaldo Berber, PT, DPT Acute Rehab Services Pager: [phone]     Reinaldo Berber 05/21/2018, 2:03 PM

## 2018-05-21 NOTE — Progress Notes (Signed)
Occupational Therapy Treatment Patient Details Name: Tim Campbell MRN: [id number] DOB: 1925-12-01 Today's Date: 05/21/2018    History of present illness 84 y.o. male admitted to ED by referral from oncologist for generalized weakness. 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 On 05/20/2018 patient noted to go into sustained symptomatic ventricular tachycardia and transferred to the ICU. PMH includes. Non Hodgkins lymphoma, TAVR, CABG, peripheral neuropathy, OA, AAA, CAD, R ankle ORF, L distal radial fx.    OT comments  Pt progressing towards established OT goals. Pt donning socks while seated at EOB with Mod A and increased time/effort. Pt requiring Mod A +2 for functional mobility with RW. Pt motivated and family very supportive. Continue to recommend dc to SNF and will continue to follow acutely as admitted.    Follow Up Recommendations  SNF;Supervision/Assistance - 24 hour    Equipment Recommendations  None recommended by OT    Recommendations for Other Services PT consult    Precautions / Restrictions Precautions Precautions: Fall Restrictions Weight Bearing Restrictions: No       Mobility Bed Mobility Overal bed mobility: Needs Assistance Bed Mobility: Supine to Sit     Supine to sit: Min assist;HOB elevated     General bed mobility comments: Pt able to bring BLEs towards EOB and then using OT hand for support to pull trunk into upright position. Pt requiring significiant time   Transfers Overall transfer level: Needs assistance Equipment used: Rolling walker (2 wheeled) Transfers: Sit to/from Stand Sit to Stand: Mod assist;+2 physical assistance         General transfer comment: Mod A x 2 to stand, cues for hand placement on RW    Balance Overall balance assessment: Needs assistance Sitting-balance support: No upper extremity supported;Feet supported Sitting balance-Leahy Scale: Fair     Standing balance support: Bilateral upper  extremity supported;During functional activity Standing balance-Leahy Scale: Poor Standing balance comment: Reliant on UE support                           ADL either performed or assessed with clinical judgement   ADL Overall ADL's : Needs assistance/impaired                     Lower Body Dressing: Moderate assistance;Sit to/from stand Lower Body Dressing Details (indicate cue type and reason): Pt donning his socks with Mod A and increased time and effort. Pt requiring OT to bring ankle towards knee and then pt bending forward to don sock. Requiring assistnae to bring socks over toes. Pt then able to bring socks over heels. Pt motivated to perform ADLs for himself                     Vision       Perception     Praxis      Cognition Arousal/Alertness: Awake/alert Behavior During Therapy: WFL for tasks assessed/performed Overall Cognitive Status: Within Functional Limits for tasks assessed                                          Exercises     Shoulder Instructions       General Comments Daughter present during session. VSS throughout    Pertinent Vitals/ Pain       Pain Assessment: Faces Faces Pain Scale: Hurts a  little bit Pain Location: low back Pain Descriptors / Indicators: Aching;Discomfort Pain Intervention(s): Monitored during session;Limited activity within patient's tolerance;Repositioned  Home Living                                          Prior Functioning/Environment              Frequency  Min 2X/week        Progress Toward Goals  OT Goals(current goals can now be found in the care plan section)  Progress towards OT goals: Progressing toward goals  Acute Rehab OT Goals Patient Stated Goal: get stronger OT Goal Formulation: With patient/family Time For Goal Achievement: 06/01/18 Potential to Achieve Goals: Good ADL Goals Pt Will Perform Grooming: with min guard  assist;standing Pt Will Perform Upper Body Dressing: with set-up;with supervision;sitting Pt Will Perform Lower Body Dressing: with min assist;sit to/from stand Pt Will Transfer to Toilet: with min assist;bedside commode;ambulating Pt Will Perform Toileting - Clothing Manipulation and hygiene: with min assist;sit to/from stand  Plan Discharge plan remains appropriate    Co-evaluation    PT/OT/SLP Co-Evaluation/Treatment: Yes Reason for Co-Treatment: Complexity of the patient's impairments (multi-system involvement);For patient/therapist safety;To address functional/ADL transfers   OT goals addressed during session: ADL's and self-care      AM-PAC PT "6 Clicks" Daily Activity     Outcome Measure   Help from another person eating meals?: None Help from another person taking care of personal grooming?: A Little Help from another person toileting, which includes using toliet, bedpan, or urinal?: A Lot Help from another person bathing (including washing, rinsing, drying)?: A Lot Help from another person to put on and taking off regular upper body clothing?: A Little Help from another person to put on and taking off regular lower body clothing?: A Lot 6 Click Score: 16    End of Session Equipment Utilized During Treatment: Gait belt;Rolling walker;Oxygen  OT Visit Diagnosis: Unsteadiness on feet (R26.81);Other abnormalities of gait and mobility (R26.89);Muscle weakness (generalized) (M62.81)   Activity Tolerance Patient tolerated treatment well   Patient Left in chair;with call bell/phone within reach;with family/visitor present   Nurse Communication Mobility status        Time: 1130-1155 OT Time Calculation (min): 25 min  Charges: OT General Charges $OT Visit: 1 Visit OT Treatments $Self Care/Home Management : 8-22 mins  [place], OTR/L Acute Rehab Pager: [phone] Office: [place] 05/21/2018, 2:47 PM

## 2018-05-21 NOTE — Plan of Care (Signed)
  Problem: Education: Goal: Knowledge of General Education information will improve Description: Including pain rating scale, medication(s)/side effects and non-pharmacologic comfort measures Outcome: Progressing   Problem: Health Behavior/Discharge Planning: Goal: Ability to manage health-related needs will improve Outcome: Progressing   Problem: Clinical Measurements: Goal: Will remain free from infection Outcome: Progressing   

## 2018-05-21 NOTE — Progress Notes (Signed)
Progress Note  Patient Name: Tim Campbell Date of Encounter: 05/21/2018  Primary Cardiologist: Dr Smith/Dr Angelena Form  Subjective   No more VT overnight - some PVC's, but generally suppressed on amiodarone. Plan for bone marrow biopsy tomorrow. Weight is up 3 kg - was net positive 1L yesterday. Hyperkalemic today - hold additional supplementation.  Inpatient Medications    Scheduled Meds: . acetaminophen  500 mg Oral TID  . amiodarone  150 mg Intravenous Once  . amoxicillin-clavulanate  1 tablet Oral Q12H  . aspirin EC  81 mg Oral Daily  . atorvastatin  40 mg Oral q1800  . carvedilol  3.125 mg Oral BID WC  . dronabinol  2.5 mg Oral TID AC  . folic acid  1 mg Oral Once per day on Mon Wed Fri  . furosemide  20 mg Oral Daily  . levothyroxine  125 mcg Oral QAC breakfast  . pneumococcal 23 valent vaccine  0.5 mL Intramuscular Tomorrow-1000  . polyethylene glycol  17 g Oral BID  . tamsulosin  0.4 mg Oral Daily   Continuous Infusions: . amiodarone 60 mg/hr (05/21/18 0731)  . sodium chloride     PRN Meds: acetaminophen **OR** acetaminophen, nitroGLYCERIN, ondansetron **OR** ondansetron (ZOFRAN) IV, oxyCODONE, sorbitol, tiZANidine   Vital Signs    Vitals:   05/21/18 0500 05/21/18 0600 05/21/18 0700 05/21/18 0800  BP: (!) 102/57 (!) 97/58 (!) 97/55 (!) 104/56  Pulse: 60 60 (!) 59 62  Resp: (!) 26 (!) 22 17 16   Temp:      TempSrc:      SpO2: 100% 100% 100% 100%  Weight:      Height:        Intake/Output Summary (Last 24 hours) at 05/21/2018 0839 Last data filed at 05/21/2018 0800 Gross per 24 hour  Intake 1976.59 ml  Output 1000 ml  Net 976.59 ml   Filed Weights   05/18/18 0405 05/19/18 0457 05/21/18 0400  Weight: 93.4 kg 93 kg 96.5 kg    Telemetry    Monomorphic VT noted yesterday - overnight, occasional PVC's - personally reviewed  Physical Exam   General appearance: alert and no distress Neck: no carotid bruit, no JVD and thyroid not enlarged, symmetric,  no tenderness/mass/nodules Lungs: clear to auscultation bilaterally Heart: regular rate and rhythm and systolic murmur: early systolic 2/6, crescendo at 2nd right intercostal space Abdomen: soft, non-tender; bowel sounds normal; no masses,  no organomegaly Extremities: extremities normal, atraumatic, no cyanosis or edema Pulses: 2+ and symmetric Skin: Skin color, texture, turgor normal. No rashes or lesions Neurologic: Mental status: Alert, oriented, thought content appropriate Psych: Pleasant  Labs    Chemistry Recent Labs  Lab 05/16/18 1450 05/17/18 0601  05/20/18 0300 05/20/18 1516 05/21/18 0244  NA 135 138   < > 136 133* 135  K 3.3* 3.0*   < > 3.8 4.4 5.5*  CL 98 99   < > 101 98 99  CO2 27 29   < > 25 27 29   GLUCOSE 132* 112*   < > 140* 137* 139*  BUN 36* 30*   < > 22 21 24*  CREATININE 1.33* 1.38*   < > 0.90 0.92 1.05  CALCIUM 7.9* 7.8*   < > 7.9* 7.9* 8.3*  PROT 5.7* 4.7*  --   --   --  4.9*  ALBUMIN 2.9* 2.4*  --   --   --  2.1*  AST 101* 82*  --   --   --  48*  ALT 44 40  --   --   --  38  ALKPHOS 69 65  --   --   --  73  BILITOT 1.0 1.0  --   --   --  0.8  GFRNONAA 46* 44*   < > >60 >60 >60  GFRAA 53* 51*   < > >60 >60 >60  ANIONGAP 10 10   < > 10 8 7    < > = values in this interval not displayed.     Hematology Recent Labs  Lab 05/19/18 0821 05/20/18 0300 05/21/18 0244  WBC 8.4 8.7 9.9  RBC 4.32 4.32 4.41  HGB 11.5* 11.5* 11.6*  HCT 36.3* 36.2* 37.0*  MCV 84.0 83.8 83.9  MCH 26.6 26.6 26.3  MCHC 31.7 31.8 31.4  RDW 15.5 15.6* 15.7*  PLT 54* 77* 99*    Cardiac Enzymes Recent Labs  Lab 05/16/18 1814 05/17/18 0006 05/17/18 0601 05/17/18 1145  TROPONINI 1.73* 1.57* 1.33* 1.30*    BNP Recent Labs  Lab 05/16/18 1450  BNP 855.2*     Radiology    Ct Chest W Contrast  Result Date: 05/19/2018 CLINICAL DATA:  Thrombocytopenia.  Past history of lymphoma. EXAM: CT CHEST, ABDOMEN, AND PELVIS WITH CONTRAST TECHNIQUE: Multidetector CT imaging of  the chest, abdomen and pelvis was performed following the standard protocol during bolus administration of intravenous contrast. CONTRAST:  163m OMNIPAQUE IOHEXOL 300 MG/ML  SOLN COMPARISON:  Multiple exams, including chest CT from 01/04/2017 and abdomen CT from 06/06/2016 FINDINGS: CT CHEST FINDINGS Cardiovascular: Coronary, aortic arch, and branch vessel atherosclerotic vascular disease. Aortic valve prosthesis. Prior CABG. Mild cardiomegaly. Mediastinum/Nodes: Small mediastinal lymph nodes are not pathologically enlarged by size criteria. Lungs/Pleura: Moderate bilateral pleural effusions, nonspecific for transudative or exudative etiology. Associated passive atelectasis noted. The previous left lower lobe pulmonary nodule referenced on the exam from 01/04/2017 is within atelectatic lung and thus completely obscured if still present. Musculoskeletal: Prominent degenerative right glenohumeral arthropathy. Multiple healed bilateral rib fractures. There are new bilateral sclerotic rib lesions of considerable suspicion for malignancy, including a 5.3 cm long area of sclerosis in the left seventh rib anteriorly, among others. Thoracic spondylosis with multilevel bridging spurring anteriorly. CT ABDOMEN PELVIS FINDINGS Hepatobiliary: Mildly contracted gallbladder. There is potentially mild gallbladder wall thickening. No focal hepatic parenchymal lesion is identified. Pancreas: Unremarkable Spleen: Faintly heterogeneous enhancement of the spleen on the later arterial phase images. The spleen measures proximally on 0.9 by 6.6 by 10.1 cm. Delayed images include the lower half of the spleen and appear relatively homogeneous. Adrenals/Urinary Tract: Mild left and borderline right hydronephrosis with moderate bilateral hydroureter extending down to the urinary bladder. Notable wall thickening in the urinary bladder favoring cystitis. A Foley catheter is present with balloon inflated in the urinary bladder. Right renal  cysts noted. There is a linear 5 mm left kidney lower pole nonobstructive renal calculus on image 87/6. Mild scarring of the left kidney. Stomach/Bowel: Proximal sigmoid colon diverticulosis. Vascular/Lymphatic: Aortoiliac atherosclerotic vascular disease. Infrarenal abdominal aortic aneurysm 3.4 cm in diameter, formerly 3.3 Cm. Rim calcified structure in the right abdomen just medial to the SVC on image 57/3, probably a calcified lymph node and less likely to be a small thrombosed aneurysm. Reproductive: Mild prostatomegaly. Other: Increase in diffuse mesenteric and perirenal stranding along with trace fluid in both paracolic gutters. Presacral edema. There is notable edema surrounding the urinary bladder wall. There is also subcutaneous edema along the pubis. Mild flank edema bilaterally. Musculoskeletal:  New 1.5 cm sclerotic lesion eccentric to the right in S1 level of the sacrum. New sclerotic lesions in the left ilium in ischium, in the right supra-acetabular region, and in the right ischium. New sclerotic lesion in the left intertrochanteric region measuring about 1.8 cm in diameter. IMPRESSION: 1. Scattered new sclerotic lesions in the skeleton especially in the ribs and bony pelvis, suspicious for malignancy. 2. Notable third spacing of fluid with moderate bilateral pleural effusions, flank edema, mesenteric and presacral edema, and a small amount of fluid in the paracolic gutters. 3. Atelectasis obscures the region of the prior left lower lobe pulmonary nodule. Size of this nodule if it is still present at all unknown. 4. Notably thickened urinary bladder, suspicious for cystitis. Moderate dilation of both ureters extending towards the urinary bladder without obstructive stone seen. 5. 3.4 cm infrarenal abdominal aortic aneurysm. Recommend followup by [place] in 3 years. This recommendation follows ACR consensus guidelines: White Paper of the ACR Incidental Findings Committee II on Vascular Findings. J Am Coll  Radiol 2013; 10:789-794. Aortic aneurysm NOS (ICD10-I71.9). 6. Other imaging findings of potential clinical significance: Aortic Atherosclerosis (ICD10-I70.0). Coronary atherosclerosis. Aortic valve prosthesis. Prior CABG. Mild cardiomegaly. Prominent right glenohumeral arthropathy. Multiple old healed rib fractures. Questionable mild gallbladder wall thickening. Nonobstructive left nephrolithiasis. Proximal sigmoid colon diverticulosis. Mild prostatomegaly. Electronically Signed   By: Van Clines M.D.   On: 05/19/2018 13:35   Ct Abdomen Pelvis W Contrast  Result Date: 05/19/2018 CLINICAL DATA:  Thrombocytopenia.  Past history of lymphoma. EXAM: CT CHEST, ABDOMEN, AND PELVIS WITH CONTRAST TECHNIQUE: Multidetector CT imaging of the chest, abdomen and pelvis was performed following the standard protocol during bolus administration of intravenous contrast. CONTRAST:  177m OMNIPAQUE IOHEXOL 300 MG/ML  SOLN COMPARISON:  Multiple exams, including chest CT from 01/04/2017 and abdomen CT from 06/06/2016 FINDINGS: CT CHEST FINDINGS Cardiovascular: Coronary, aortic arch, and branch vessel atherosclerotic vascular disease. Aortic valve prosthesis. Prior CABG. Mild cardiomegaly. Mediastinum/Nodes: Small mediastinal lymph nodes are not pathologically enlarged by size criteria. Lungs/Pleura: Moderate bilateral pleural effusions, nonspecific for transudative or exudative etiology. Associated passive atelectasis noted. The previous left lower lobe pulmonary nodule referenced on the exam from 01/04/2017 is within atelectatic lung and thus completely obscured if still present. Musculoskeletal: Prominent degenerative right glenohumeral arthropathy. Multiple healed bilateral rib fractures. There are new bilateral sclerotic rib lesions of considerable suspicion for malignancy, including a 5.3 cm long area of sclerosis in the left seventh rib anteriorly, among others. Thoracic spondylosis with multilevel bridging spurring  anteriorly. CT ABDOMEN PELVIS FINDINGS Hepatobiliary: Mildly contracted gallbladder. There is potentially mild gallbladder wall thickening. No focal hepatic parenchymal lesion is identified. Pancreas: Unremarkable Spleen: Faintly heterogeneous enhancement of the spleen on the later arterial phase images. The spleen measures proximally on 0.9 by 6.6 by 10.1 cm. Delayed images include the lower half of the spleen and appear relatively homogeneous. Adrenals/Urinary Tract: Mild left and borderline right hydronephrosis with moderate bilateral hydroureter extending down to the urinary bladder. Notable wall thickening in the urinary bladder favoring cystitis. A Foley catheter is present with balloon inflated in the urinary bladder. Right renal cysts noted. There is a linear 5 mm left kidney lower pole nonobstructive renal calculus on image 87/6. Mild scarring of the left kidney. Stomach/Bowel: Proximal sigmoid colon diverticulosis. Vascular/Lymphatic: Aortoiliac atherosclerotic vascular disease. Infrarenal abdominal aortic aneurysm 3.4 cm in diameter, formerly 3.3 Cm. Rim calcified structure in the right abdomen just medial to the SVC on image 57/3, probably a calcified lymph node  and less likely to be a small thrombosed aneurysm. Reproductive: Mild prostatomegaly. Other: Increase in diffuse mesenteric and perirenal stranding along with trace fluid in both paracolic gutters. Presacral edema. There is notable edema surrounding the urinary bladder wall. There is also subcutaneous edema along the pubis. Mild flank edema bilaterally. Musculoskeletal: New 1.5 cm sclerotic lesion eccentric to the right in S1 level of the sacrum. New sclerotic lesions in the left ilium in ischium, in the right supra-acetabular region, and in the right ischium. New sclerotic lesion in the left intertrochanteric region measuring about 1.8 cm in diameter. IMPRESSION: 1. Scattered new sclerotic lesions in the skeleton especially in the ribs and bony  pelvis, suspicious for malignancy. 2. Notable third spacing of fluid with moderate bilateral pleural effusions, flank edema, mesenteric and presacral edema, and a small amount of fluid in the paracolic gutters. 3. Atelectasis obscures the region of the prior left lower lobe pulmonary nodule. Size of this nodule if it is still present at all unknown. 4. Notably thickened urinary bladder, suspicious for cystitis. Moderate dilation of both ureters extending towards the urinary bladder without obstructive stone seen. 5. 3.4 cm infrarenal abdominal aortic aneurysm. Recommend followup by [place] in 3 years. This recommendation follows ACR consensus guidelines: White Paper of the ACR Incidental Findings Committee II on Vascular Findings. J Am Coll Radiol 2013; 10:789-794. Aortic aneurysm NOS (ICD10-I71.9). 6. Other imaging findings of potential clinical significance: Aortic Atherosclerosis (ICD10-I70.0). Coronary atherosclerosis. Aortic valve prosthesis. Prior CABG. Mild cardiomegaly. Prominent right glenohumeral arthropathy. Multiple old healed rib fractures. Questionable mild gallbladder wall thickening. Nonobstructive left nephrolithiasis. Proximal sigmoid colon diverticulosis. Mild prostatomegaly. Electronically Signed   By: Van Clines M.D.   On: 05/19/2018 13:35    Patient Profile     Tim Campbell is a 84 y.o. male with a history of CAD s/p 5V-CABG in 1993 with subsequent stenting to grafts (BMS to prox VG-PDA in 2012, BMS to SVG-OM in 03/2014), severe AS s/p TAVR in 06/2016 with 29 mm Sapien 3 valve, HFrEF with last known EF 40-45%, HTN, RBBB, HLD, hypothyroidism, large cell non-Hodgkins lympoma, BPH s/p thallium laser ablation of his prostate on 12/05/16 with indwelling catheter who presents from OSH with troponinemia in the setting of generalized weakness, leg swelling and hypotension. Of note, has a recent MRI lumbar spine demonstrating multifocal bone lesions most consistent with metastatic carcinoma.    Assessment & Plan    1 elevated troponin-patient denies chest pain.  There is no clear trend. Findings consistent with cardiomyopathy. No plans for cath.  2 cardiomyopathy-echocardiogram shows ejection fraction 30 to 35%.  We will continue with beta-blockade but changed to carvedilol 3.125 mg twice daily.  BP will not allow addition of ACEI/ARB/ARNI. On oral lasix 20 mg daily, - weight up and retaining volume, increase lasix to 40 mg daily.  3 coronary artery disease-continue aspirin and restart statin.  4 MRI suggestive of metastatic carcinoma-further evaluation per primary care.  5 prior aortic valve replacement-stable on echocardiogram.  6 thrombocytopenia-we will continue aspirin 81 mg daily for coronary disease and prior aortic valve replacement but discontinue Plavix.  7. Sustained VT - No issues overnight. Will d/c amiodarone gtts. Switch to po amiodarone 400 mg daily.   Ok from cardiology standpoint to move out of the ICU today.  Pixie Casino, MD, [facility], [place] Director of the [facility]  [facility] Diplomate of the American Board of Clinical Lipidology  Attending Cardiologist  Direct Dial: [phone]  Fax: [phone]  Website:  [email or web address]  Pixie Casino, MD  05/21/2018, 8:39 AM

## 2018-05-21 NOTE — Progress Notes (Addendum)
PROGRESS NOTE        PATIENT DETAILS Name: Tim Campbell Age: 84 y.o. Sex: male Date of Birth: 23-Nov-1925 Admit Date: 05/16/2018 Admitting Physician Rise Patience, MD JFH:LKTGY, Herbie [place], MD  Brief Narrative: Patient is a 84 y.o. male with prior history of CAD status post CABG, TAVR, hypothyroidism, dyslipidemia, history of lymphoma referred by patient's oncologist for evaluation of multiple metastatic lesions seen on MRI and associated low back pain.  Hospital course complicated by with elevated troponin, decompensated systolic heart failure, sustained monomorphic ventricular tachycardia.  See below for further details  Subjective: Back pain is stable-no chest pain or shortness of breath.  Wants to ambulate with the nursing/rehab staff.  Assessment/Plan: Sustained ventricular tachycardia: Resolved after amiodarone and lidocaine infusion.  Cardiology has discontinued amiodarone today, with plans to switch to oral amiodarone.  Transfer to PCU later today.  Surgery, not a candidate for AICD due to advanced malignancy.  Acute on chronic systolic heart failure (EF 30-35% by TTE on 05/17/2018): Continue stab lower extremity edema, weights have increased-Lasix increased to 40 mg daily.  Continue Coreg.  Surgery following.  Non-STEMI: Probably type II non-STEMI in a setting of demand ischemia.  Cardiology does not recommend invasive management at this time.  Continue aspirin, statin and beta-blocker.  No longer on Plavix due to thrombocytopenia.  Metastatic bony/spinal lesions: PSA significantly elevated-work-up in progress-but suspicion for metastatic prostate cancer.  Bone marrow biopsy scheduled for tomorrow, awaiting bone scan.  Oncology following.  Thrombocytopenia: Slowly improving-may be related to underlying malignancy.  Follow for now.  Enterococcus faecalis/Klebsiella pneumonia UTI: Has a chronic Foley catheter in place-probably catheter related UTI.   Previously on IV Rocephin, now on Augmentin (3/7 days).  Hypothyroidism: Continue Synthroid  BPH: Continue Flomax  AAA: Stable for outpatient follow-up  Severe aortic stenosis status post TAVR  Generalized weakness: Secondary to acute illness-underlying malignancy-continue evaluation by rehab services.  Probably SNF on discharge  DVT Prophylaxis: SCD's  Code Status: Partial  Family Communication: Daughter at bedside  Disposition Plan: Remain inpatient-SNF on discharge later this week-transfer to PCU/cardiac telemetry  Antimicrobial agents: Anti-infectives (From admission, onward)   Start     Dose/Rate Route Frequency Ordered Stop   05/18/18 2200  amoxicillin-clavulanate (AUGMENTIN) 875-125 MG per tablet 1 tablet     1 tablet Oral Every 12 hours 05/18/18 1731     05/18/18 1400  amoxicillin (AMOXIL) capsule 500 mg  Status:  Discontinued     500 mg Oral Every 8 hours 05/18/18 0839 05/18/18 1731   05/17/18 0100  cefTRIAXone (ROCEPHIN) 1 g in sodium chloride 0.9 % 100 mL IVPB  Status:  Discontinued     1 g 200 mL/hr over 30 Minutes Intravenous Every 24 hours 05/16/18 2344 05/18/18 0839      Procedures: None  CONSULTS: Cardiology Neurology IR  Time spent: 25 minutes-Greater than 50% of this time was spent in counseling, explanation of diagnosis, planning of further management, and coordination of care.  MEDICATIONS: Scheduled Meds: . acetaminophen  500 mg Oral TID  . amiodarone  400 mg Oral Daily  . amoxicillin-clavulanate  1 tablet Oral Q12H  . aspirin EC  81 mg Oral Daily  . atorvastatin  40 mg Oral q1800  . carvedilol  3.125 mg Oral BID WC  . dronabinol  2.5 mg Oral TID AC  . folic acid  1 mg Oral Once per day on Mon Wed Fri  . furosemide  40 mg Oral Daily  . levothyroxine  125 mcg Oral QAC breakfast  . pneumococcal 23 valent vaccine  0.5 mL Intramuscular Tomorrow-1000  . polyethylene glycol  17 g Oral BID  . tamsulosin  0.4 mg Oral Daily   Continuous  Infusions: . sodium chloride     PRN Meds:.acetaminophen **OR** acetaminophen, nitroGLYCERIN, ondansetron **OR** ondansetron (ZOFRAN) IV, oxyCODONE, sorbitol, tiZANidine   PHYSICAL EXAM: Vital signs: Vitals:   05/21/18 0800 05/21/18 0900 05/21/18 1000 05/21/18 1100  BP: (!) 104/56 (!) 87/52 (!) 101/59 (!) 100/58  Pulse: 62 (!) 57 60 (!) 56  Resp: 16 19 (!) 24 20  Temp:      TempSrc:      SpO2: 100% 100% 100% 100%  Weight:      Height:       Filed Weights   05/18/18 0405 05/19/18 0457 05/21/18 0400  Weight: 93.4 kg 93 kg 96.5 kg   Body mass index is 30.53 kg/m.   General appearance :Awake, alert, not in any distress.  Chronically sick appearing Eyes:Pink conjunctiva HEENT: Atraumatic and Normocephalic Neck: No thyromegaly Resp:Good air entry bilaterally, few bibasilar rales CVS: S1 S2 regular, no murmurs.  GI: Bowel sounds present, Non tender and not distended with no gaurding, rigidity or rebound.No organomegaly Extremities: B/L Lower Ext shows plus edema, both legs are warm to touch Neurology: Nonfocal but generalized weakness present. Psychiatric: Normal judgment and insight. Alert and oriented x 3. Normal mood. Musculoskeletal:No digital cyanosis Skin:No Rash, warm and dry Wounds:N/A  I have personally reviewed following labs and imaging studies  LABORATORY DATA: CBC: Recent Labs  Lab 05/16/18 1226  05/16/18 1450 05/17/18 0601 05/19/18 0345 05/19/18 0821 05/20/18 0300 05/21/18 0244  WBC 5.3   < > 5.7 5.4 8.4 8.4 8.7 9.9  NEUTROABS 4.3  --  4.4 4.2  --  6.2  --  7.4  HGB 11.7*   < > 12.0* 11.1* 11.7* 11.5* 11.5* 11.6*  HCT 35.4*  --  35.8* 34.1* 36.6* 36.3* 36.2* 37.0*  MCV 82.9  --  81.7 83.4 83.6 84.0 83.8 83.9  PLT 78*   < > 75* 56* 58* 54* 77* 99*   < > = values in this interval not displayed.    Basic Metabolic Panel: Recent Labs  Lab 05/19/18 0345 05/20/18 0008 05/20/18 0300 05/20/18 1516 05/21/18 0244  NA 134* 133* 136 133* 135  K 3.8  3.8 3.8 4.4 5.5*  CL 99 99 101 98 99  CO2 28 25 25 27 29   GLUCOSE 125* 142* 140* 137* 139*  BUN 26* 24* 22 21 24*  CREATININE 1.05 0.89 0.90 0.92 1.05  CALCIUM 8.0* 7.6* 7.9* 7.9* 8.3*  MG 1.7 1.9 1.9 2.2 2.0    GFR: Estimated Creatinine Clearance: 55.6 mL/min (by C-G formula based on SCr of 1.05 mg/dL).  Liver Function Tests: Recent Labs  Lab 05/16/18 1226 05/16/18 1450 05/17/18 0601 05/21/18 0244  AST 103* 101* 82* 48*  ALT 46* 44 40 38  ALKPHOS 87 69 65 73  BILITOT 1.0 1.0 1.0 0.8  PROT 5.6* 5.7* 4.7* 4.9*  ALBUMIN 2.8* 2.9* 2.4* 2.1*   No results for input(s): LIPASE, AMYLASE in the last 168 hours. No results for input(s): AMMONIA in the last 168 hours.  Coagulation Profile: Recent Labs  Lab 05/19/18 0821  INR 1.23    Cardiac Enzymes: Recent Labs  Lab 05/16/18 1814 05/17/18 0006 05/17/18 0601  05/17/18 1145  TROPONINI 1.73* 1.57* 1.33* 1.30*    BNP (last 3 results) No results for input(s): PROBNP in the last 8760 hours.  HbA1C: No results for input(s): HGBA1C in the last 72 hours.  CBG: Recent Labs  Lab 05/19/18 2325  GLUCAP 150*    Lipid Profile: No results for input(s): CHOL, HDL, LDLCALC, TRIG, CHOLHDL, LDLDIRECT in the last 72 hours.  Thyroid Function Tests: No results for input(s): TSH, T4TOTAL, FREET4, T3FREE, THYROIDAB in the last 72 hours.  Anemia Panel: No results for input(s): VITAMINB12, FOLATE, FERRITIN, TIBC, IRON, RETICCTPCT in the last 72 hours.  Urine analysis:    Component Value Date/Time   COLORURINE YELLOW 05/16/2018 1507   APPEARANCEUR CLOUDY (A) 05/16/2018 1507   LABSPEC 1.020 05/16/2018 1507   PHURINE 6.0 05/16/2018 1507   GLUCOSEU NEGATIVE 05/16/2018 1507   HGBUR LARGE (A) 05/16/2018 1507   BILIRUBINUR NEGATIVE 05/16/2018 1507   KETONESUR NEGATIVE 05/16/2018 1507   PROTEINUR 100 (A) 05/16/2018 1507   UROBILINOGEN 0.2 06/01/2015 0850   NITRITE NEGATIVE 05/16/2018 1507   LEUKOCYTESUR TRACE (A) 05/16/2018 1507      Sepsis Labs: Lactic Acid, Venous    Component Value Date/Time   LATICACIDVEN 1.50 05/16/2018 1555    MICROBIOLOGY: Recent Results (from the past 240 hour(s))  Urine culture     Status: Abnormal   Collection Time: 05/16/18  3:07 PM  Result Value Ref Range Status   Specimen Description   Final    URINE, RANDOM Performed at [facility], [address] [place], [place] [address]    Special Requests   Final    NONE Performed at [facility], [address] [place], [place] [address]    Culture (A)  Final    >=100,000 COLONIES/mL ENTEROCOCCUS FAECALIS 20,000 COLONIES/mL KLEBSIELLA PNEUMONIAE    Report Status 05/18/2018 FINAL  Final   Organism ID, Bacteria ENTEROCOCCUS FAECALIS (A)  Final   Organism ID, Bacteria KLEBSIELLA PNEUMONIAE (A)  Final      Susceptibility   Enterococcus faecalis - MIC*    AMPICILLIN <=2 SENSITIVE Sensitive     LEVOFLOXACIN 1 SENSITIVE Sensitive     NITROFURANTOIN <=16 SENSITIVE Sensitive     VANCOMYCIN 1 SENSITIVE Sensitive     * >=100,000 COLONIES/mL ENTEROCOCCUS FAECALIS   Klebsiella pneumoniae - MIC*    AMPICILLIN RESISTANT Resistant     CEFAZOLIN <=4 SENSITIVE Sensitive     CEFTRIAXONE <=1 SENSITIVE Sensitive     CIPROFLOXACIN <=0.25 SENSITIVE Sensitive     GENTAMICIN <=1 SENSITIVE Sensitive     IMIPENEM <=0.25 SENSITIVE Sensitive     NITROFURANTOIN 32 SENSITIVE Sensitive     TRIMETH/SULFA <=20 SENSITIVE Sensitive     AMPICILLIN/SULBACTAM 4 SENSITIVE Sensitive     PIP/TAZO <=4 SENSITIVE Sensitive     Extended ESBL NEGATIVE Sensitive     * 20,000 COLONIES/mL KLEBSIELLA PNEUMONIAE  MRSA PCR Screening     Status: None   Collection Time: 05/17/18  3:15 AM  Result Value Ref Range Status   MRSA by PCR NEGATIVE NEGATIVE Final    Comment:        The GeneXpert MRSA Assay (FDA approved for NASAL specimens only), is one component of a comprehensive MRSA colonization surveillance program. It is not intended to diagnose  MRSA infection nor to guide or monitor treatment for MRSA infections. Performed at [facility], [place] [address] [place], [place] [address]     RADIOLOGY STUDIES/RESULTS: Dg Chest 2  View  Result Date: 05/16/2018 CLINICAL DATA:  Acute lower extremity swelling. No fever. Cough and congestion. EXAM: CHEST - 2 VIEW COMPARISON:  12/17/2016 FINDINGS: There changes from previous cardiac surgery and aortic valve replacement. Cardiac silhouette is mildly enlarged. No mediastinal or hilar masses. No evidence of adenopathy. Small pleural effusions. Prominent bronchovascular markings without overt pulmonary edema. No evidence of pneumonia. No pneumothorax. Skeletal structures are demineralized but grossly intact. IMPRESSION: 1. No acute cardiopulmonary disease. 2. Mild cardiomegaly with stable changes from prior cardiac surgery. 3. Small pleural effusions. Electronically Signed   By: Lajean Manes M.D.   On: 05/16/2018 17:35   Ct Head Wo Contrast  Result Date: 05/18/2018 CLINICAL DATA:  Metastatic spine tumor suspected EXAM: CT HEAD WITHOUT CONTRAST TECHNIQUE: Contiguous axial images were obtained from the base of the skull through the vertex without intravenous contrast. COMPARISON:  Brain MRI 03/02/2005 FINDINGS: Brain: Bilateral inferior frontal gliosis with pattern suggesting remote contusion, stable since 2006 at least. Partly calcified far anterior right parafalcine mass measuring 13 mm. In retrospect there was meningioma in this location measuring 6 mm in 2006. Mild for age cerebral volume loss. No evidence of acute infarct, hemorrhage, hydrocephalus, or collection. Vascular: Atherosclerotic calcification.  No hyperdense vessel Skull: No acute finding. Sinuses/Orbits: Bilateral cataract resection. IMPRESSION: 1. No acute finding. 2. 13 mm anterior falcine meningioma with mild growth since 2006. 3. Remote bilateral inferior frontal contusion. Electronically Signed   By: Monte Fantasia M.D.   On:  05/18/2018 12:24   Ct Chest W Contrast  Result Date: 05/19/2018 CLINICAL DATA:  Thrombocytopenia.  Past history of lymphoma. EXAM: CT CHEST, ABDOMEN, AND PELVIS WITH CONTRAST TECHNIQUE: Multidetector CT imaging of the chest, abdomen and pelvis was performed following the standard protocol during bolus administration of intravenous contrast. CONTRAST:  175m OMNIPAQUE IOHEXOL 300 MG/ML  SOLN COMPARISON:  Multiple exams, including chest CT from 01/04/2017 and abdomen CT from 06/06/2016 FINDINGS: CT CHEST FINDINGS Cardiovascular: Coronary, aortic arch, and branch vessel atherosclerotic vascular disease. Aortic valve prosthesis. Prior CABG. Mild cardiomegaly. Mediastinum/Nodes: Small mediastinal lymph nodes are not pathologically enlarged by size criteria. Lungs/Pleura: Moderate bilateral pleural effusions, nonspecific for transudative or exudative etiology. Associated passive atelectasis noted. The previous left lower lobe pulmonary nodule referenced on the exam from 01/04/2017 is within atelectatic lung and thus completely obscured if still present. Musculoskeletal: Prominent degenerative right glenohumeral arthropathy. Multiple healed bilateral rib fractures. There are new bilateral sclerotic rib lesions of considerable suspicion for malignancy, including a 5.3 cm long area of sclerosis in the left seventh rib anteriorly, among others. Thoracic spondylosis with multilevel bridging spurring anteriorly. CT ABDOMEN PELVIS FINDINGS Hepatobiliary: Mildly contracted gallbladder. There is potentially mild gallbladder wall thickening. No focal hepatic parenchymal lesion is identified. Pancreas: Unremarkable Spleen: Faintly heterogeneous enhancement of the spleen on the later arterial phase images. The spleen measures proximally on 0.9 by 6.6 by 10.1 cm. Delayed images include the lower half of the spleen and appear relatively homogeneous. Adrenals/Urinary Tract: Mild left and borderline right hydronephrosis with moderate  bilateral hydroureter extending down to the urinary bladder. Notable wall thickening in the urinary bladder favoring cystitis. A Foley catheter is present with balloon inflated in the urinary bladder. Right renal cysts noted. There is a linear 5 mm left kidney lower pole nonobstructive renal calculus on image 87/6. Mild scarring of the left kidney. Stomach/Bowel: Proximal sigmoid colon diverticulosis. Vascular/Lymphatic: Aortoiliac atherosclerotic vascular disease. Infrarenal abdominal aortic aneurysm 3.4 cm in diameter, formerly 3.3 Cm. Rim calcified structure in  the right abdomen just medial to the SVC on image 57/3, probably a calcified lymph node and less likely to be a small thrombosed aneurysm. Reproductive: Mild prostatomegaly. Other: Increase in diffuse mesenteric and perirenal stranding along with trace fluid in both paracolic gutters. Presacral edema. There is notable edema surrounding the urinary bladder wall. There is also subcutaneous edema along the pubis. Mild flank edema bilaterally. Musculoskeletal: New 1.5 cm sclerotic lesion eccentric to the right in S1 level of the sacrum. New sclerotic lesions in the left ilium in ischium, in the right supra-acetabular region, and in the right ischium. New sclerotic lesion in the left intertrochanteric region measuring about 1.8 cm in diameter. IMPRESSION: 1. Scattered new sclerotic lesions in the skeleton especially in the ribs and bony pelvis, suspicious for malignancy. 2. Notable third spacing of fluid with moderate bilateral pleural effusions, flank edema, mesenteric and presacral edema, and a small amount of fluid in the paracolic gutters. 3. Atelectasis obscures the region of the prior left lower lobe pulmonary nodule. Size of this nodule if it is still present at all unknown. 4. Notably thickened urinary bladder, suspicious for cystitis. Moderate dilation of both ureters extending towards the urinary bladder without obstructive stone seen. 5. 3.4 cm  infrarenal abdominal aortic aneurysm. Recommend followup by [place] in 3 years. This recommendation follows ACR consensus guidelines: White Paper of the ACR Incidental Findings Committee II on Vascular Findings. J Am Coll Radiol 2013; 10:789-794. Aortic aneurysm NOS (ICD10-I71.9). 6. Other imaging findings of potential clinical significance: Aortic Atherosclerosis (ICD10-I70.0). Coronary atherosclerosis. Aortic valve prosthesis. Prior CABG. Mild cardiomegaly. Prominent right glenohumeral arthropathy. Multiple old healed rib fractures. Questionable mild gallbladder wall thickening. Nonobstructive left nephrolithiasis. Proximal sigmoid colon diverticulosis. Mild prostatomegaly. Electronically Signed   By: Van Clines M.D.   On: 05/19/2018 13:35   Mr Lumbar Spine Wo Contrast  Result Date: 05/11/2018 CLINICAL DATA:  Back pain. Left leg and hip pain over the last 3 weeks. Distant history of lymphoma. EXAM: MRI LUMBAR SPINE WITHOUT CONTRAST TECHNIQUE: Multiplanar, multisequence MR imaging of the lumbar spine was performed. No intravenous contrast was administered. COMPARISON:  CT 06/06/2016. FINDINGS: Segmentation:  5 lumbar type vertebral bodies. Alignment:  No significant malalignment. Vertebrae: Multiple marrow space lesions scattered throughout the region consistent with metastatic carcinoma or possibly multifocal lymphoma. There are foci of involvement at T11, T12, L1, L2, L3, L4 and the sacrum. Both iliac bones are involved. No extraosseous tumor is seen. No tumor encroachment upon the spinal canal or neural foramina. Conus medullaris and cauda equina: Conus extends to the L1 level. Conus and cauda equina appear normal. Paraspinal and other soft tissues: Distended bladder with trabeculation. Bilateral hydroureteronephrosis, most likely secondary to chronic bladder outlet obstruction. Mild aneurysmal dilatation of the infrarenal abdominal aorta. Maximal diameter 3.3 cm. Disc levels: T12-L1: Mild bulging of the  disc.  No compressive stenosis. L1-2: Normal disc space. L2-3: Endplate osteophytes and bulging of the disc. Mild stenosis of both lateral recesses but no definite neural compression. L3-4: Endplate osteophytes and bulging of the disc. Mild stenosis of the lateral recesses. Some potential for neural compression particularly on the right. L4-5: Endplate osteophytes and bulging of the disc. Facet and ligamentous hypertrophy. Stenosis of the lateral recesses right more than left. Some potential for neural compression on the right. L5-S1: Endplate osteophytes and bulging of the disc. No central canal stenosis. No compressive foraminal stenosis. IMPRESSION: Multifocal bone lesions scattered throughout the lower thoracic spine, sacrum and iliac bones most consistent with metastatic  carcinoma. Multifocal lymphoma can have this appearance. No pathologic fracture. No extraosseous tumor or apparent effect upon the neural structures. Distended bladder. Bilateral hydroureteronephrosis. Findings suggest chronic bladder outlet obstruction. Infrarenal abdominal aortic aneurysm maximal diameter 3.3 cm. Recommend followup by ultrasound in 3 years. This recommendation follows ACR consensus guidelines: White Paper of the ACR Incidental Findings Committee II on Vascular Findings. 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 No compressive stenosis seen to explain left leg symptoms. Mild right more than left lateral recess stenosis at L3-4 and L4-5 that could possibly cause right-sided symptoms. These results will be called to the ordering clinician or representative by the Radiologist Assistant, and communication documented in the PACS or zVision Dashboard. Routine call during normal Monday hours is planned. Electronically Signed   By: Nelson Chimes M.D.   On: 05/11/2018 14:23   Ct Abdomen Pelvis W Contrast  Result Date: 05/19/2018 CLINICAL DATA:   Thrombocytopenia.  Past history of lymphoma. EXAM: CT CHEST, ABDOMEN, AND PELVIS WITH CONTRAST TECHNIQUE: Multidetector CT imaging of the chest, abdomen and pelvis was performed following the standard protocol during bolus administration of intravenous contrast. CONTRAST:  131m OMNIPAQUE IOHEXOL 300 MG/ML  SOLN COMPARISON:  Multiple exams, including chest CT from 01/04/2017 and abdomen CT from 06/06/2016 FINDINGS: CT CHEST FINDINGS Cardiovascular: Coronary, aortic arch, and branch vessel atherosclerotic vascular disease. Aortic valve prosthesis. Prior CABG. Mild cardiomegaly. Mediastinum/Nodes: Small mediastinal lymph nodes are not pathologically enlarged by size criteria. Lungs/Pleura: Moderate bilateral pleural effusions, nonspecific for transudative or exudative etiology. Associated passive atelectasis noted. The previous left lower lobe pulmonary nodule referenced on the exam from 01/04/2017 is within atelectatic lung and thus completely obscured if still present. Musculoskeletal: Prominent degenerative right glenohumeral arthropathy. Multiple healed bilateral rib fractures. There are new bilateral sclerotic rib lesions of considerable suspicion for malignancy, including a 5.3 cm long area of sclerosis in the left seventh rib anteriorly, among others. Thoracic spondylosis with multilevel bridging spurring anteriorly. CT ABDOMEN PELVIS FINDINGS Hepatobiliary: Mildly contracted gallbladder. There is potentially mild gallbladder wall thickening. No focal hepatic parenchymal lesion is identified. Pancreas: Unremarkable Spleen: Faintly heterogeneous enhancement of the spleen on the later arterial phase images. The spleen measures proximally on 0.9 by 6.6 by 10.1 cm. Delayed images include the lower half of the spleen and appear relatively homogeneous. Adrenals/Urinary Tract: Mild left and borderline right hydronephrosis with moderate bilateral hydroureter extending down to the urinary bladder. Notable wall  thickening in the urinary bladder favoring cystitis. A Foley catheter is present with balloon inflated in the urinary bladder. Right renal cysts noted. There is a linear 5 mm left kidney lower pole nonobstructive renal calculus on image 87/6. Mild scarring of the left kidney. Stomach/Bowel: Proximal sigmoid colon diverticulosis. Vascular/Lymphatic: Aortoiliac atherosclerotic vascular disease. Infrarenal abdominal aortic aneurysm 3.4 cm in diameter, formerly 3.3 Cm. Rim calcified structure in the right abdomen just medial to the SVC on image 57/3, probably a calcified lymph node and less likely to be a small thrombosed aneurysm. Reproductive: Mild prostatomegaly. Other: Increase in diffuse mesenteric and perirenal stranding along with trace fluid in both paracolic gutters. Presacral edema. There is notable edema surrounding the urinary bladder wall. There is also subcutaneous edema along the pubis. Mild flank edema bilaterally. Musculoskeletal: New 1.5 cm sclerotic lesion eccentric to the right in S1 level of the sacrum. New sclerotic lesions in the left ilium in ischium, in the right supra-acetabular region, and in the right ischium. New  sclerotic lesion in the left intertrochanteric region measuring about 1.8 cm in diameter. IMPRESSION: 1. Scattered new sclerotic lesions in the skeleton especially in the ribs and bony pelvis, suspicious for malignancy. 2. Notable third spacing of fluid with moderate bilateral pleural effusions, flank edema, mesenteric and presacral edema, and a small amount of fluid in the paracolic gutters. 3. Atelectasis obscures the region of the prior left lower lobe pulmonary nodule. Size of this nodule if it is still present at all unknown. 4. Notably thickened urinary bladder, suspicious for cystitis. Moderate dilation of both ureters extending towards the urinary bladder without obstructive stone seen. 5. 3.4 cm infrarenal abdominal aortic aneurysm. Recommend followup by [place] in 3 years. This  recommendation follows ACR consensus guidelines: White Paper of the ACR Incidental Findings Committee II on Vascular Findings. J Am Coll Radiol 2013; 10:789-794. Aortic aneurysm NOS (ICD10-I71.9). 6. Other imaging findings of potential clinical significance: Aortic Atherosclerosis (ICD10-I70.0). Coronary atherosclerosis. Aortic valve prosthesis. Prior CABG. Mild cardiomegaly. Prominent right glenohumeral arthropathy. Multiple old healed rib fractures. Questionable mild gallbladder wall thickening. Nonobstructive left nephrolithiasis. Proximal sigmoid colon diverticulosis. Mild prostatomegaly. Electronically Signed   By: Van Clines M.D.   On: 05/19/2018 13:35     LOS: 5 days   Oren Binet, MD  [facility]  If 7PM-7AM, please contact night-coverage  Please page via [email or web address] [email or web address]-click on MD name and type text message  05/21/2018, 12:52 PM

## 2018-05-21 NOTE — Progress Notes (Signed)
The PSA came back at a level of 155.  His bone scan will be done today.  The bone marrow biopsy result is not back yet.  I have to believe that he is going to end up having metastatic prostate cancer.  As such, his prognosis for this could be easily 2 or 3 years.  Given this fact, I think that would ever needs to be done for his heart should not be dictated by him having prostate cancer.  If this is some other malignancy, then yes, I think his cancer would be a big factor in decision-making for cardiology.  However, I just cannot imagine that he has anything but metastatic prostate cancer.  It is bone confined by all of our studies.  I probably would treat him with testosterone ablation.  This I am sure will be highly effective in a gentleman who is 84 years old.  It looks like the V. tach is under control.  He is on amiodarone.  Hopefully, the bone marrow biopsy will be back today.  I will see what his testosterone level is.  I might consider giving him a "chemical orchiectomy" which should be highly effective for the prostate cancer.  Hopefully the bone marrow biopsy will be back today.  His platelet count is getting better.  Today, the platelet count is 99,000.  His white cell count is 9.9 and hemoglobin 11.6.  His potassium is quite high at 5.5.  I suppose he is by getting some kind of potassium supplementation.  Again, I highly suspect that we are dealing with metastatic prostate cancer that is only confined to the bones.  Survival could easily be 2 or 3 years, if not longer.  As such, his cardiac status is probably a greater determining of his mortality right now.  Lattie Haw, MD  1 Thessalonians 5:16-18

## 2018-05-22 ENCOUNTER — Inpatient Hospital Stay (HOSPITAL_COMMUNITY): Payer: Medicare Other

## 2018-05-22 LAB — CBC
HCT: 34.4 % — ABNORMAL LOW (ref 39.0–52.0)
HEMOGLOBIN: 11 g/dL — AB (ref 13.0–17.0)
MCH: 26.4 pg (ref 26.0–34.0)
MCHC: 32 g/dL (ref 30.0–36.0)
MCV: 82.5 fL (ref 78.0–100.0)
Platelets: 108 10*3/uL — ABNORMAL LOW (ref 150–400)
RBC: 4.17 MIL/uL — AB (ref 4.22–5.81)
RDW: 15.5 % (ref 11.5–15.5)
WBC: 8.2 10*3/uL (ref 4.0–10.5)

## 2018-05-22 LAB — BASIC METABOLIC PANEL
Anion gap: 5 (ref 5–15)
BUN: 20 mg/dL (ref 8–23)
CHLORIDE: 102 mmol/L (ref 98–111)
CO2: 27 mmol/L (ref 22–32)
CREATININE: 0.89 mg/dL (ref 0.61–1.24)
Calcium: 7.9 mg/dL — ABNORMAL LOW (ref 8.9–10.3)
Glucose, Bld: 106 mg/dL — ABNORMAL HIGH (ref 70–99)
POTASSIUM: 4.4 mmol/L (ref 3.5–5.1)
SODIUM: 134 mmol/L — AB (ref 135–145)

## 2018-05-22 LAB — TESTOSTERONE: TESTOSTERONE: 21 ng/dL — AB (ref 264–916)

## 2018-05-22 LAB — MAGNESIUM: MAGNESIUM: 1.8 mg/dL (ref 1.7–2.4)

## 2018-05-22 MED ORDER — BICALUTAMIDE 50 MG PO TABS
50.0000 mg | ORAL_TABLET | Freq: Every day | ORAL | Status: DC
  Administered 2018-05-22 – 2018-05-23 (×2): 50 mg via ORAL
  Filled 2018-05-22 (×2): qty 1

## 2018-05-22 MED ORDER — FENTANYL CITRATE (PF) 100 MCG/2ML IJ SOLN
INTRAMUSCULAR | Status: AC | PRN
  Administered 2018-05-22: 50 ug via INTRAVENOUS
  Administered 2018-05-22: 25 ug via INTRAVENOUS

## 2018-05-22 MED ORDER — MAGNESIUM SULFATE 2 GM/50ML IV SOLN
2.0000 g | Freq: Once | INTRAVENOUS | Status: AC
  Administered 2018-05-22: 2 g via INTRAVENOUS
  Filled 2018-05-22: qty 50

## 2018-05-22 MED ORDER — FENTANYL CITRATE (PF) 100 MCG/2ML IJ SOLN
INTRAMUSCULAR | Status: AC
  Filled 2018-05-22: qty 4

## 2018-05-22 MED ORDER — MIDAZOLAM HCL 2 MG/2ML IJ SOLN
INTRAMUSCULAR | Status: AC
  Filled 2018-05-22: qty 4

## 2018-05-22 MED ORDER — MIDAZOLAM HCL 2 MG/2ML IJ SOLN
INTRAMUSCULAR | Status: AC | PRN
  Administered 2018-05-22: 1 mg via INTRAVENOUS
  Administered 2018-05-22: 0.5 mg via INTRAVENOUS

## 2018-05-22 MED ORDER — DENOSUMAB 120 MG/1.7ML ~~LOC~~ SOLN
120.0000 mg | Freq: Once | SUBCUTANEOUS | Status: AC
  Administered 2018-05-22: 120 mg via SUBCUTANEOUS
  Filled 2018-05-22: qty 1.7

## 2018-05-22 MED ORDER — DEGARELIX ACETATE 120 MG ~~LOC~~ SOLR
240.0000 mg | Freq: Once | SUBCUTANEOUS | Status: AC
  Administered 2018-05-22: 240 mg via SUBCUTANEOUS
  Filled 2018-05-22: qty 6

## 2018-05-22 MED ORDER — SODIUM CHLORIDE 0.9 % IV SOLN
1.0000 g | Freq: Once | INTRAVENOUS | Status: AC
  Administered 2018-05-22: 1 g via INTRAVENOUS
  Filled 2018-05-22: qty 10

## 2018-05-22 MED ORDER — LIDOCAINE HCL 1 % IJ SOLN
INTRAMUSCULAR | Status: AC
Start: 2018-05-22 — End: 2018-05-22
  Filled 2018-05-22: qty 20

## 2018-05-22 NOTE — Procedures (Signed)
Interventional Radiology Procedure Note  Procedure: CT guided bone marrow aspiration and biopsy  Complications: None  EBL: < 10 mL  Findings: Aspirate and core biopsy performed of bone marrow in right iliac bone.  Plan: Bedrest supine x 1 hrs  Volanda Mangine T. Eston Heslin, M.D Pager:  [phone]   

## 2018-05-22 NOTE — Plan of Care (Signed)
  Problem: Education: Goal: Knowledge of General Education information will improve Description: Including pain rating scale, medication(s)/side effects and non-pharmacologic comfort measures Outcome: Progressing   Problem: Safety: Goal: Ability to remain free from injury will improve Outcome: Progressing   Problem: Cardiac: Goal: Ability to achieve and maintain adequate cardiopulmonary perfusion will improve Outcome: Progressing   

## 2018-05-22 NOTE — Progress Notes (Signed)
PROGRESS NOTE        PATIENT DETAILS Name: Tim Campbell Age: 84 y.o. Sex: male Date of Birth: [birth year] Admit Date: 05/16/2018 Admitting Physician Rise Patience, MD TKZ:SWFUX, Herbie [place], MD  Brief Narrative: Patient is a 84 y.o. male with prior history of CAD status post CABG, TAVR, hypothyroidism, dyslipidemia, history of lymphoma referred by patient's oncologist for evaluation of multiple metastatic lesions seen on MRI and associated low back pain.  Hospital course complicated by with elevated troponin, decompensated systolic heart failure, sustained monomorphic ventricular tachycardia.  See below for further details  Subjective: No major issues overnight-back pain is stable.  No chest pain or shortness of breath.  Assessment/Plan: Sustained ventricular tachycardia: Resolved after amiodarone and lidocaine infusion, he is now just on oral amiodarone.  Maintaining sinus rhythm.  Per cardiology, patient is not a candidate for AICD due to advanced malignancy.  Acute on chronic systolic heart failure (EF 30-35% by TTE on 05/17/2018): Compensated, hardly any lower extremity edema today.  Continue Coreg and Lasix 40 mg daily.   Non-STEMI: Probably type II non-STEMI in the setting of demand ischemia.  Cardiology does not recommend invasive work-up.  Continue aspirin, statin and beta-blocker.  No no longer on Plavix due to thrombocytopenia.    Metastatic bony/spinal lesions: High suspicion for metastatic prostate cancer, PSA significantly elevated.  Bone marrow biopsy performed on 8/15, awaiting results.  Nuclear bone scan on 8/15 shows diffuse osseous metastatic disease.  Oncology following with plans to start hormone therapy today.    Thrombocytopenia: Improved, likely related to underlying malignancy.  Will do CBC periodically.  Enterococcus faecalis/Klebsiella pneumonia UTI: Has a chronic Foley catheter in place-probably catheter related UTI.  Previously on IV  Rocephin, now on Augmentin (4/7 days).  Hypothyroidism: Continue Synthroid  BPH: Flomax  AAA: Stable for outpatient follow-up  Severe aortic stenosis status post TAVR  Generalized weakness: Secondary to acute illness-underlying malignancy-continue evaluation by rehab services.  Probably SNF on discharge  DVT Prophylaxis: SCD's  Code Status: Partial  Family Communication: Daughter at bedside  Disposition Plan: Remain inpatient-SNF facility tomorrow if clinical improvement continues.  Antimicrobial agents: Anti-infectives (From admission, onward)   Start     Dose/Rate Route Frequency Ordered Stop   05/18/18 2200  amoxicillin-clavulanate (AUGMENTIN) 875-125 MG per tablet 1 tablet     1 tablet Oral Every 12 hours 05/18/18 1731     05/18/18 1400  amoxicillin (AMOXIL) capsule 500 mg  Status:  Discontinued     500 mg Oral Every 8 hours 05/18/18 0839 05/18/18 1731   05/17/18 0100  cefTRIAXone (ROCEPHIN) 1 g in sodium chloride 0.9 % 100 mL IVPB  Status:  Discontinued     1 g 200 mL/hr over 30 Minutes Intravenous Every 24 hours 05/16/18 2344 05/18/18 0839      Procedures: None  CONSULTS: Cardiology Neurology IR  Time spent: 25 minutes-Greater than 50% of this time was spent in counseling, explanation of diagnosis, planning of further management, and coordination of care.  MEDICATIONS: Scheduled Meds: . acetaminophen  500 mg Oral TID  . amiodarone  400 mg Oral Daily  . amoxicillin-clavulanate  1 tablet Oral Q12H  . aspirin EC  81 mg Oral Daily  . atorvastatin  40 mg Oral q1800  . bicalutamide  50 mg Oral Daily  . carvedilol  3.125 mg Oral BID WC  .  degarelix  240 mg Subcutaneous Once  . denosumab  120 mg Subcutaneous Once  . dronabinol  2.5 mg Oral TID AC  . fentaNYL      . folic acid  1 mg Oral Once per day on Mon Wed Fri  . furosemide  40 mg Oral Daily  . levothyroxine  125 mcg Oral QAC breakfast  . lidocaine      . midazolam      . pneumococcal 23 valent  vaccine  0.5 mL Intramuscular Tomorrow-1000  . polyethylene glycol  17 g Oral BID  . tamsulosin  0.4 mg Oral Daily   Continuous Infusions: . calcium gluconate    . sodium chloride     PRN Meds:.acetaminophen **OR** acetaminophen, nitroGLYCERIN, ondansetron **OR** ondansetron (ZOFRAN) IV, oxyCODONE, sorbitol, tiZANidine   PHYSICAL EXAM: Vital signs: Vitals:   05/22/18 0925 05/22/18 0930 05/22/18 1004 05/22/18 1220  BP: 113/62 (!) 111/58  (!) 99/55  Pulse: 70 68 63 70  Resp: 18 16  (!) 23  Temp:    98 F (36.7 C)  TempSrc:    Oral  SpO2: 100% 100%  100%  Weight:      Height:       Filed Weights   05/19/18 0457 05/21/18 0400 05/22/18 0457  Weight: 93 kg 96.5 kg 94.3 kg   Body mass index is 29.83 kg/m.   General appearance:Awake, alert, not in any distress.  Eyes:no scleral icterus. HEENT: Atraumatic and Normocephalic Neck: supple, no JVD. Resp:Good air entry bilaterally,no rales or rhonchi CVS: S1 S2 regular, no murmurs.  GI: Bowel sounds present, Non tender and not distended with no gaurding, rigidity or rebound. Extremities: B/L Lower Ext shows trace edema, both legs are warm to touch Neurology:  Non focal Psychiatric: Normal judgment and insight. Normal mood. Musculoskeletal:No digital cyanosis Skin:No Rash, warm and dry Wounds:N/A  I have personally reviewed following labs and imaging studies  LABORATORY DATA: CBC: Recent Labs  Lab 05/16/18 1226  05/16/18 1450 05/17/18 0601 05/19/18 0345 05/19/18 0821 05/20/18 0300 05/21/18 0244 05/22/18 0317  WBC 5.3   < > 5.7 5.4 8.4 8.4 8.7 9.9 8.2  NEUTROABS 4.3  --  4.4 4.2  --  6.2  --  7.4  --   HGB 11.7*   < > 12.0* 11.1* 11.7* 11.5* 11.5* 11.6* 11.0*  HCT 35.4*  --  35.8* 34.1* 36.6* 36.3* 36.2* 37.0* 34.4*  MCV 82.9  --  81.7 83.4 83.6 84.0 83.8 83.9 82.5  PLT 78*   < > 75* 56* 58* 54* 77* 99* 108*   < > = values in this interval not displayed.    Basic Metabolic Panel: Recent Labs  Lab 05/20/18 0008  05/20/18 0300 05/20/18 1516 05/21/18 0244 05/22/18 0317  NA 133* 136 133* 135 134*  K 3.8 3.8 4.4 5.5* 4.4  CL 99 101 98 99 102  CO2 25 25 27 29 27   GLUCOSE 142* 140* 137* 139* 106*  BUN 24* 22 21 24* 20  CREATININE 0.89 0.90 0.92 1.05 0.89  CALCIUM 7.6* 7.9* 7.9* 8.3* 7.9*  MG 1.9 1.9 2.2 2.0 1.8    GFR: Estimated Creatinine Clearance: 64.9 mL/min (by C-G formula based on SCr of 0.89 mg/dL).  Liver Function Tests: Recent Labs  Lab 05/16/18 1226 05/16/18 1450 05/17/18 0601 05/21/18 0244  AST 103* 101* 82* 48*  ALT 46* 44 40 38  ALKPHOS 87 69 65 73  BILITOT 1.0 1.0 1.0 0.8  PROT 5.6* 5.7* 4.7* 4.9*  ALBUMIN  2.8* 2.9* 2.4* 2.1*   No results for input(s): LIPASE, AMYLASE in the last 168 hours. No results for input(s): AMMONIA in the last 168 hours.  Coagulation Profile: Recent Labs  Lab 05/19/18 0821  INR 1.23    Cardiac Enzymes: Recent Labs  Lab 05/16/18 1814 05/17/18 0006 05/17/18 0601 05/17/18 1145  TROPONINI 1.73* 1.57* 1.33* 1.30*    BNP (last 3 results) No results for input(s): PROBNP in the last 8760 hours.  HbA1C: No results for input(s): HGBA1C in the last 72 hours.  CBG: Recent Labs  Lab 05/19/18 2325  GLUCAP 150*    Lipid Profile: No results for input(s): CHOL, HDL, LDLCALC, TRIG, CHOLHDL, LDLDIRECT in the last 72 hours.  Thyroid Function Tests: No results for input(s): TSH, T4TOTAL, FREET4, T3FREE, THYROIDAB in the last 72 hours.  Anemia Panel: No results for input(s): VITAMINB12, FOLATE, FERRITIN, TIBC, IRON, RETICCTPCT in the last 72 hours.  Urine analysis:    Component Value Date/Time   COLORURINE YELLOW 05/16/2018 1507   APPEARANCEUR CLOUDY (A) 05/16/2018 1507   LABSPEC 1.020 05/16/2018 1507   PHURINE 6.0 05/16/2018 1507   GLUCOSEU NEGATIVE 05/16/2018 1507   HGBUR LARGE (A) 05/16/2018 1507   BILIRUBINUR NEGATIVE 05/16/2018 1507   KETONESUR NEGATIVE 05/16/2018 1507   PROTEINUR 100 (A) 05/16/2018 1507   UROBILINOGEN 0.2  06/01/2015 0850   NITRITE NEGATIVE 05/16/2018 1507   LEUKOCYTESUR TRACE (A) 05/16/2018 1507    Sepsis Labs: Lactic Acid, Venous    Component Value Date/Time   LATICACIDVEN 1.50 05/16/2018 1555    MICROBIOLOGY: Recent Results (from the past 240 hour(s))  Urine culture     Status: Abnormal   Collection Time: 05/16/18  3:07 PM  Result Value Ref Range Status   Specimen Description   Final    URINE, RANDOM Performed at [facility], [address] [place], [place] [address]    Special Requests   Final    NONE Performed at [facility], [address] [place], [place] [address]    Culture (A)  Final    >=100,000 COLONIES/mL ENTEROCOCCUS FAECALIS 20,000 COLONIES/mL KLEBSIELLA PNEUMONIAE    Report Status 05/18/2018 FINAL  Final   Organism ID, Bacteria ENTEROCOCCUS FAECALIS (A)  Final   Organism ID, Bacteria KLEBSIELLA PNEUMONIAE (A)  Final      Susceptibility   Enterococcus faecalis - MIC*    AMPICILLIN <=2 SENSITIVE Sensitive     LEVOFLOXACIN 1 SENSITIVE Sensitive     NITROFURANTOIN <=16 SENSITIVE Sensitive     VANCOMYCIN 1 SENSITIVE Sensitive     * >=100,000 COLONIES/mL ENTEROCOCCUS FAECALIS   Klebsiella pneumoniae - MIC*    AMPICILLIN RESISTANT Resistant     CEFAZOLIN <=4 SENSITIVE Sensitive     CEFTRIAXONE <=1 SENSITIVE Sensitive     CIPROFLOXACIN <=0.25 SENSITIVE Sensitive     GENTAMICIN <=1 SENSITIVE Sensitive     IMIPENEM <=0.25 SENSITIVE Sensitive     NITROFURANTOIN 32 SENSITIVE Sensitive     TRIMETH/SULFA <=20 SENSITIVE Sensitive     AMPICILLIN/SULBACTAM 4 SENSITIVE Sensitive     PIP/TAZO <=4 SENSITIVE Sensitive     Extended ESBL NEGATIVE Sensitive     * 20,000 COLONIES/mL KLEBSIELLA PNEUMONIAE  MRSA PCR Screening     Status: None   Collection Time: 05/17/18  3:15 AM  Result Value Ref Range Status   MRSA by PCR NEGATIVE NEGATIVE Final    Comment:        The GeneXpert MRSA Assay (FDA approved for  NASAL specimens only), is one  component of a comprehensive MRSA colonization surveillance program. It is not intended to diagnose MRSA infection nor to guide or monitor treatment for MRSA infections. Performed at [facility], [place] [address], [place], [place] [address]     RADIOLOGY STUDIES/RESULTS: Dg Chest 2 View  Result Date: 05/16/2018 CLINICAL DATA:  Acute lower extremity swelling. No fever. Cough and congestion. EXAM: CHEST - 2 VIEW COMPARISON:  12/17/2016 FINDINGS: There changes from previous cardiac surgery and aortic valve replacement. Cardiac silhouette is mildly enlarged. No mediastinal or hilar masses. No evidence of adenopathy. Small pleural effusions. Prominent bronchovascular markings without overt pulmonary edema. No evidence of pneumonia. No pneumothorax. Skeletal structures are demineralized but grossly intact. IMPRESSION: 1. No acute cardiopulmonary disease. 2. Mild cardiomegaly with stable changes from prior cardiac surgery. 3. Small pleural effusions. Electronically Signed   By: Lajean Manes M.D.   On: 05/16/2018 17:35   Ct Head Wo Contrast  Result Date: 05/18/2018 CLINICAL DATA:  Metastatic spine tumor suspected EXAM: CT HEAD WITHOUT CONTRAST TECHNIQUE: Contiguous axial images were obtained from the base of the skull through the vertex without intravenous contrast. COMPARISON:  Brain MRI 03/02/2005 FINDINGS: Brain: Bilateral inferior frontal gliosis with pattern suggesting remote contusion, stable since 2006 at least. Partly calcified far anterior right parafalcine mass measuring 13 mm. In retrospect there was meningioma in this location measuring 6 mm in 2006. Mild for age cerebral volume loss. No evidence of acute infarct, hemorrhage, hydrocephalus, or collection. Vascular: Atherosclerotic calcification.  No hyperdense vessel Skull: No acute finding. Sinuses/Orbits: Bilateral cataract resection. IMPRESSION: 1. No acute finding. 2. 13 mm anterior falcine meningioma with mild growth since 2006. 3.  Remote bilateral inferior frontal contusion. Electronically Signed   By: Monte Fantasia M.D.   On: 05/18/2018 12:24   Ct Chest W Contrast  Result Date: 05/19/2018 CLINICAL DATA:  Thrombocytopenia.  Past history of lymphoma. EXAM: CT CHEST, ABDOMEN, AND PELVIS WITH CONTRAST TECHNIQUE: Multidetector CT imaging of the chest, abdomen and pelvis was performed following the standard protocol during bolus administration of intravenous contrast. CONTRAST:  167m OMNIPAQUE IOHEXOL 300 MG/ML  SOLN COMPARISON:  Multiple exams, including chest CT from 01/04/2017 and abdomen CT from 06/06/2016 FINDINGS: CT CHEST FINDINGS Cardiovascular: Coronary, aortic arch, and branch vessel atherosclerotic vascular disease. Aortic valve prosthesis. Prior CABG. Mild cardiomegaly. Mediastinum/Nodes: Small mediastinal lymph nodes are not pathologically enlarged by size criteria. Lungs/Pleura: Moderate bilateral pleural effusions, nonspecific for transudative or exudative etiology. Associated passive atelectasis noted. The previous left lower lobe pulmonary nodule referenced on the exam from 01/04/2017 is within atelectatic lung and thus completely obscured if still present. Musculoskeletal: Prominent degenerative right glenohumeral arthropathy. Multiple healed bilateral rib fractures. There are new bilateral sclerotic rib lesions of considerable suspicion for malignancy, including a 5.3 cm long area of sclerosis in the left seventh rib anteriorly, among others. Thoracic spondylosis with multilevel bridging spurring anteriorly. CT ABDOMEN PELVIS FINDINGS Hepatobiliary: Mildly contracted gallbladder. There is potentially mild gallbladder wall thickening. No focal hepatic parenchymal lesion is identified. Pancreas: Unremarkable Spleen: Faintly heterogeneous enhancement of the spleen on the later arterial phase images. The spleen measures proximally on 0.9 by 6.6 by 10.1 cm. Delayed images include the lower half of the spleen and appear  relatively homogeneous. Adrenals/Urinary Tract: Mild left and borderline right hydronephrosis with moderate bilateral hydroureter extending down to the urinary bladder. Notable wall thickening in the urinary bladder favoring cystitis. A Foley catheter is present with balloon inflated in the  urinary bladder. Right renal cysts noted. There is a linear 5 mm left kidney lower pole nonobstructive renal calculus on image 87/6. Mild scarring of the left kidney. Stomach/Bowel: Proximal sigmoid colon diverticulosis. Vascular/Lymphatic: Aortoiliac atherosclerotic vascular disease. Infrarenal abdominal aortic aneurysm 3.4 cm in diameter, formerly 3.3 Cm. Rim calcified structure in the right abdomen just medial to the SVC on image 57/3, probably a calcified lymph node and less likely to be a small thrombosed aneurysm. Reproductive: Mild prostatomegaly. Other: Increase in diffuse mesenteric and perirenal stranding along with trace fluid in both paracolic gutters. Presacral edema. There is notable edema surrounding the urinary bladder wall. There is also subcutaneous edema along the pubis. Mild flank edema bilaterally. Musculoskeletal: New 1.5 cm sclerotic lesion eccentric to the right in S1 level of the sacrum. New sclerotic lesions in the left ilium in ischium, in the right supra-acetabular region, and in the right ischium. New sclerotic lesion in the left intertrochanteric region measuring about 1.8 cm in diameter. IMPRESSION: 1. Scattered new sclerotic lesions in the skeleton especially in the ribs and bony pelvis, suspicious for malignancy. 2. Notable third spacing of fluid with moderate bilateral pleural effusions, flank edema, mesenteric and presacral edema, and a small amount of fluid in the paracolic gutters. 3. Atelectasis obscures the region of the prior left lower lobe pulmonary nodule. Size of this nodule if it is still present at all unknown. 4. Notably thickened urinary bladder, suspicious for cystitis. Moderate  dilation of both ureters extending towards the urinary bladder without obstructive stone seen. 5. 3.4 cm infrarenal abdominal aortic aneurysm. Recommend followup by [place] in 3 years. This recommendation follows ACR consensus guidelines: White Paper of the ACR Incidental Findings Committee II on Vascular Findings. J Am Coll Radiol 2013; 10:789-794. Aortic aneurysm NOS (ICD10-I71.9). 6. Other imaging findings of potential clinical significance: Aortic Atherosclerosis (ICD10-I70.0). Coronary atherosclerosis. Aortic valve prosthesis. Prior CABG. Mild cardiomegaly. Prominent right glenohumeral arthropathy. Multiple old healed rib fractures. Questionable mild gallbladder wall thickening. Nonobstructive left nephrolithiasis. Proximal sigmoid colon diverticulosis. Mild prostatomegaly. Electronically Signed   By: Van Clines M.D.   On: 05/19/2018 13:35   Mr Lumbar Spine Wo Contrast  Result Date: 05/11/2018 CLINICAL DATA:  Back pain. Left leg and hip pain over the last 3 weeks. Distant history of lymphoma. EXAM: MRI LUMBAR SPINE WITHOUT CONTRAST TECHNIQUE: Multiplanar, multisequence MR imaging of the lumbar spine was performed. No intravenous contrast was administered. COMPARISON:  CT 06/06/2016. FINDINGS: Segmentation:  5 lumbar type vertebral bodies. Alignment:  No significant malalignment. Vertebrae: Multiple marrow space lesions scattered throughout the region consistent with metastatic carcinoma or possibly multifocal lymphoma. There are foci of involvement at T11, T12, L1, L2, L3, L4 and the sacrum. Both iliac bones are involved. No extraosseous tumor is seen. No tumor encroachment upon the spinal canal or neural foramina. Conus medullaris and cauda equina: Conus extends to the L1 level. Conus and cauda equina appear normal. Paraspinal and other soft tissues: Distended bladder with trabeculation. Bilateral hydroureteronephrosis, most likely secondary to chronic bladder outlet obstruction. Mild aneurysmal  dilatation of the infrarenal abdominal aorta. Maximal diameter 3.3 cm. Disc levels: T12-L1: Mild bulging of the disc.  No compressive stenosis. L1-2: Normal disc space. L2-3: Endplate osteophytes and bulging of the disc. Mild stenosis of both lateral recesses but no definite neural compression. L3-4: Endplate osteophytes and bulging of the disc. Mild stenosis of the lateral recesses. Some potential for neural compression particularly on the right. L4-5: Endplate osteophytes and bulging of the disc. Facet and  ligamentous hypertrophy. Stenosis of the lateral recesses right more than left. Some potential for neural compression on the right. L5-S1: Endplate osteophytes and bulging of the disc. No central canal stenosis. No compressive foraminal stenosis. IMPRESSION: Multifocal bone lesions scattered throughout the lower thoracic spine, sacrum and iliac bones most consistent with metastatic carcinoma. Multifocal lymphoma can have this appearance. No pathologic fracture. No extraosseous tumor or apparent effect upon the neural structures. Distended bladder. Bilateral hydroureteronephrosis. Findings suggest chronic bladder outlet obstruction. Infrarenal abdominal aortic aneurysm maximal diameter 3.3 cm. Recommend followup by ultrasound in 3 years. This recommendation follows ACR consensus guidelines: White Paper of the ACR Incidental Findings Committee II on Vascular Findings. 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 No compressive stenosis seen to explain left leg symptoms. Mild right more than left lateral recess stenosis at L3-4 and L4-5 that could possibly cause right-sided symptoms. These results will be called to the ordering clinician or representative by the Radiologist Assistant, and communication documented in the PACS or zVision Dashboard. Routine call during normal Monday hours is planned. Electronically Signed   By: Nelson Chimes  M.D.   On: 05/11/2018 14:23   Nm Bone Scan Whole Body  Result Date: 05/21/2018 CLINICAL DATA:  Back pain. History of prostate cancer. Abnormal recent lumbar spine MRI. EXAM: NUCLEAR MEDICINE WHOLE BODY BONE SCAN TECHNIQUE: Whole body anterior and posterior images were obtained approximately 3 hours after intravenous injection of radiopharmaceutical. RADIOPHARMACEUTICALS:  22.0 mCi Technetium-79mMDP IV COMPARISON:  Lumbar spine MRI 05/11/2018 and CT abdomen/pelvis 05/19/2018 FINDINGS: As demonstrated on the CT scan and MRI there are numerous bone lesions throughout the axial and appendicular skeleton most consistent with prostate cancer metastasis. This most significantly involves the left hemipelvis, ribs, spine and both femurs. IMPRESSION: Diffuse osseous metastatic disease, likely prostate cancer. Electronically Signed   By: PMarijo SanesM.D.   On: 05/21/2018 16:03   Ct Abdomen Pelvis W Contrast  Result Date: 05/19/2018 CLINICAL DATA:  Thrombocytopenia.  Past history of lymphoma. EXAM: CT CHEST, ABDOMEN, AND PELVIS WITH CONTRAST TECHNIQUE: Multidetector CT imaging of the chest, abdomen and pelvis was performed following the standard protocol during bolus administration of intravenous contrast. CONTRAST:  1035mOMNIPAQUE IOHEXOL 300 MG/ML  SOLN COMPARISON:  Multiple exams, including chest CT from 01/04/2017 and abdomen CT from 06/06/2016 FINDINGS: CT CHEST FINDINGS Cardiovascular: Coronary, aortic arch, and branch vessel atherosclerotic vascular disease. Aortic valve prosthesis. Prior CABG. Mild cardiomegaly. Mediastinum/Nodes: Small mediastinal lymph nodes are not pathologically enlarged by size criteria. Lungs/Pleura: Moderate bilateral pleural effusions, nonspecific for transudative or exudative etiology. Associated passive atelectasis noted. The previous left lower lobe pulmonary nodule referenced on the exam from 01/04/2017 is within atelectatic lung and thus completely obscured if still present.  Musculoskeletal: Prominent degenerative right glenohumeral arthropathy. Multiple healed bilateral rib fractures. There are new bilateral sclerotic rib lesions of considerable suspicion for malignancy, including a 5.3 cm long area of sclerosis in the left seventh rib anteriorly, among others. Thoracic spondylosis with multilevel bridging spurring anteriorly. CT ABDOMEN PELVIS FINDINGS Hepatobiliary: Mildly contracted gallbladder. There is potentially mild gallbladder wall thickening. No focal hepatic parenchymal lesion is identified. Pancreas: Unremarkable Spleen: Faintly heterogeneous enhancement of the spleen on the later arterial phase images. The spleen measures proximally on 0.9 by 6.6 by 10.1 cm. Delayed images include the lower half of the spleen and appear relatively homogeneous. Adrenals/Urinary Tract: Mild left and borderline right hydronephrosis with moderate bilateral hydroureter extending down  to the urinary bladder. Notable wall thickening in the urinary bladder favoring cystitis. A Foley catheter is present with balloon inflated in the urinary bladder. Right renal cysts noted. There is a linear 5 mm left kidney lower pole nonobstructive renal calculus on image 87/6. Mild scarring of the left kidney. Stomach/Bowel: Proximal sigmoid colon diverticulosis. Vascular/Lymphatic: Aortoiliac atherosclerotic vascular disease. Infrarenal abdominal aortic aneurysm 3.4 cm in diameter, formerly 3.3 Cm. Rim calcified structure in the right abdomen just medial to the SVC on image 57/3, probably a calcified lymph node and less likely to be a small thrombosed aneurysm. Reproductive: Mild prostatomegaly. Other: Increase in diffuse mesenteric and perirenal stranding along with trace fluid in both paracolic gutters. Presacral edema. There is notable edema surrounding the urinary bladder wall. There is also subcutaneous edema along the pubis. Mild flank edema bilaterally. Musculoskeletal: New 1.5 cm sclerotic lesion  eccentric to the right in S1 level of the sacrum. New sclerotic lesions in the left ilium in ischium, in the right supra-acetabular region, and in the right ischium. New sclerotic lesion in the left intertrochanteric region measuring about 1.8 cm in diameter. IMPRESSION: 1. Scattered new sclerotic lesions in the skeleton especially in the ribs and bony pelvis, suspicious for malignancy. 2. Notable third spacing of fluid with moderate bilateral pleural effusions, flank edema, mesenteric and presacral edema, and a small amount of fluid in the paracolic gutters. 3. Atelectasis obscures the region of the prior left lower lobe pulmonary nodule. Size of this nodule if it is still present at all unknown. 4. Notably thickened urinary bladder, suspicious for cystitis. Moderate dilation of both ureters extending towards the urinary bladder without obstructive stone seen. 5. 3.4 cm infrarenal abdominal aortic aneurysm. Recommend followup by [place] in 3 years. This recommendation follows ACR consensus guidelines: White Paper of the ACR Incidental Findings Committee II on Vascular Findings. J Am Coll Radiol 2013; 10:789-794. Aortic aneurysm NOS (ICD10-I71.9). 6. Other imaging findings of potential clinical significance: Aortic Atherosclerosis (ICD10-I70.0). Coronary atherosclerosis. Aortic valve prosthesis. Prior CABG. Mild cardiomegaly. Prominent right glenohumeral arthropathy. Multiple old healed rib fractures. Questionable mild gallbladder wall thickening. Nonobstructive left nephrolithiasis. Proximal sigmoid colon diverticulosis. Mild prostatomegaly. Electronically Signed   By: Van Clines M.D.   On: 05/19/2018 13:35   Ct Bone Marrow Biopsy & Aspiration  Result Date: 05/22/2018 CLINICAL DATA:  Thrombocytopenia and prior history of lymphoma. Bone marrow biopsy requested for further workup. EXAM: CT GUIDED BONE MARROW ASPIRATION AND BIOPSY ANESTHESIA/SEDATION: Versed 1.5 mg IV, Fentanyl 75 mcg IV Total Moderate Sedation  Time:   13 minutes. The patient's level of consciousness and physiologic status were continuously monitored during the procedure by Radiology nursing. PROCEDURE: The procedure risks, benefits, and alternatives were explained to the patient. Questions regarding the procedure were encouraged and answered. The patient understands and consents to the procedure. A time out was performed prior to initiating the procedure. The right gluteal region was prepped with chlorhexidine. Sterile gown and sterile gloves were used for the procedure. Local anesthesia was provided with 1% Lidocaine. Under CT guidance, an 11 gauge On Control bone cutting needle was advanced from a posterior approach into the right iliac bone. Needle positioning was confirmed with CT. Initial non heparinized and heparinized aspirate samples were obtained of bone marrow. Core biopsy was performed via the On Control drill needle. COMPLICATIONS: None FINDINGS: Inspection of initial aspirate did reveal visible particles. Intact core biopsy sample was obtained. IMPRESSION: CT guided bone marrow biopsy of right posterior iliac bone with both aspirate  and core samples obtained. Electronically Signed   By: Aletta Edouard M.D.   On: 05/22/2018 11:07     LOS: 6 days   Oren Binet, MD  [facility]  If 7PM-7AM, please contact night-coverage  Please page via [email or web address] [email or web address]-click on MD name and type text message  05/22/2018, 1:13 PM

## 2018-05-22 NOTE — Progress Notes (Signed)
Progress Note  Patient Name: Tim Campbell Date of Encounter: 05/22/2018  Primary Cardiologist: Dr Smith/Dr Angelena Form  Subjective   Looks like some AIVR yesterday - asymptomatic, self-limiting. Had BM biopsy today. Diuresed with 2 kg weight loss overnight.  Inpatient Medications    Scheduled Meds: . acetaminophen  500 mg Oral TID  . amiodarone  400 mg Oral Daily  . amoxicillin-clavulanate  1 tablet Oral Q12H  . aspirin EC  81 mg Oral Daily  . atorvastatin  40 mg Oral q1800  . bicalutamide  50 mg Oral Daily  . carvedilol  3.125 mg Oral BID WC  . degarelix  240 mg Subcutaneous Once  . denosumab  120 mg Subcutaneous Once  . dronabinol  2.5 mg Oral TID AC  . fentaNYL      . folic acid  1 mg Oral Once per day on Mon Wed Fri  . furosemide  40 mg Oral Daily  . levothyroxine  125 mcg Oral QAC breakfast  . lidocaine      . midazolam      . pneumococcal 23 valent vaccine  0.5 mL Intramuscular Tomorrow-1000  . polyethylene glycol  17 g Oral BID  . tamsulosin  0.4 mg Oral Daily   Continuous Infusions: . magnesium sulfate 1 - 4 g bolus IVPB 2 g (05/22/18 1136)  . sodium chloride     PRN Meds: acetaminophen **OR** acetaminophen, nitroGLYCERIN, ondansetron **OR** ondansetron (ZOFRAN) IV, oxyCODONE, sorbitol, tiZANidine   Vital Signs    Vitals:   05/22/18 0920 05/22/18 0925 05/22/18 0930 05/22/18 1004  BP: 115/60 113/62 (!) 111/58   Pulse: 68 70 68 63  Resp: _0 Temp:      TempSrc:      SpO2: 100% 100% 100%   Weight:      Height:        Intake/Output Summary (Last 24 hours) at 05/22/2018 1210 Last data filed at 05/22/2018 0702 Gross per 24 hour  Intake 240 ml  Output 550 ml  Net -310 ml   Filed Weights   05/19/18 0457 05/21/18 0400 05/22/18 0457  Weight: 93 kg 96.5 kg 94.3 kg    Telemetry    Sinus with short period of AIVR overnight- personally reviewed  Physical Exam   General appearance: alert and no distress Neck: no carotid bruit, no JVD and  thyroid not enlarged, symmetric, no tenderness/mass/nodules Lungs: clear to auscultation bilaterally Heart: regular rate and rhythm and systolic murmur: early systolic 2/6, crescendo at 2nd right intercostal space Abdomen: soft, non-tender; bowel sounds normal; no masses,  no organomegaly Extremities: extremities normal, atraumatic, no cyanosis or edema Pulses: 2+ and symmetric Skin: Skin color, texture, turgor normal. No rashes or lesions Neurologic: Mental status: Alert, oriented, thought content appropriate Psych: Pleasant  Labs    Chemistry Recent Labs  Lab 05/16/18 1450 05/17/18 0601  05/20/18 1516 05/21/18 0244 05/22/18 0317  NA 135 138   < > 133* 135 134*  K 3.3* 3.0*   < > 4.4 5.5* 4.4  CL 98 99   < > 98 99 102  CO2 27 29   < > _1 GLUCOSE 132* 112*   < > 137* 139* 106*  BUN 36* 30*   < > 21 24* 20  CREATININE 1.33* 1.38*   < > 0.92 1.05 0.89  CALCIUM 7.9* 7.8*   < > 7.9* 8.3* 7.9*  PROT 5.7* 4.7*  --   --  4.9*  --  ALBUMIN 2.9* 2.4*  --   --  2.1*  --   AST 101* 82*  --   --  48*  --   ALT 44 40  --   --  38  --   ALKPHOS 69 65  --   --  73  --   BILITOT 1.0 1.0  --   --  0.8  --   GFRNONAA 46* 44*   < > >60 >60 >60  GFRAA 53* 51*   < > >60 >60 >60  ANIONGAP 10 10   < > _0 < > = values in this interval not displayed.     Hematology Recent Labs  Lab 05/20/18 0300 05/21/18 0244 05/22/18 0317  WBC 8.7 9.9 8.2  RBC 4.32 4.41 4.17*  HGB 11.5* 11.6* 11.0*  HCT 36.2* 37.0* 34.4*  MCV 83.8 83.9 82.5  MCH 26.6 26.3 26.4  MCHC 31.8 31.4 32.0  RDW 15.6* 15.7* 15.5  PLT 77* 99* 108*    Cardiac Enzymes Recent Labs  Lab 05/16/18 1814 05/17/18 0006 05/17/18 0601 05/17/18 1145  TROPONINI 1.73* 1.57* 1.33* 1.30*    BNP Recent Labs  Lab 05/16/18 1450  BNP 855.2*     Radiology    Nm Bone Scan Whole Body  Result Date: 05/21/2018 CLINICAL DATA:  Back pain. History of prostate cancer. Abnormal recent lumbar spine MRI. EXAM: NUCLEAR  MEDICINE WHOLE BODY BONE SCAN TECHNIQUE: Whole body anterior and posterior images were obtained approximately 3 hours after intravenous injection of radiopharmaceutical. RADIOPHARMACEUTICALS:  22.0 mCi Technetium-70mMDP IV COMPARISON:  Lumbar spine MRI 05/11/2018 and CT abdomen/pelvis 05/19/2018 FINDINGS: As demonstrated on the CT scan and MRI there are numerous bone lesions throughout the axial and appendicular skeleton most consistent with prostate cancer metastasis. This most significantly involves the left hemipelvis, ribs, spine and both femurs. IMPRESSION: Diffuse osseous metastatic disease, likely prostate cancer. Electronically Signed   By: PMarijo SanesM.D.   On: 05/21/2018 16:03   Ct Bone Marrow Biopsy & Aspiration  Result Date: 05/22/2018 CLINICAL DATA:  Thrombocytopenia and prior history of lymphoma. Bone marrow biopsy requested for further workup. EXAM: CT GUIDED BONE MARROW ASPIRATION AND BIOPSY ANESTHESIA/SEDATION: Versed 1.5 mg IV, Fentanyl 75 mcg IV Total Moderate Sedation Time:   13 minutes. The patient's level of consciousness and physiologic status were continuously monitored during the procedure by Radiology nursing. PROCEDURE: The procedure risks, benefits, and alternatives were explained to the patient. Questions regarding the procedure were encouraged and answered. The patient understands and consents to the procedure. A time out was performed prior to initiating the procedure. The right gluteal region was prepped with chlorhexidine. Sterile gown and sterile gloves were used for the procedure. Local anesthesia was provided with 1% Lidocaine. Under CT guidance, an 11 gauge On Control bone cutting needle was advanced from a posterior approach into the right iliac bone. Needle positioning was confirmed with CT. Initial non heparinized and heparinized aspirate samples were obtained of bone marrow. Core biopsy was performed via the On Control drill needle. COMPLICATIONS: None FINDINGS:  Inspection of initial aspirate did reveal visible particles. Intact core biopsy sample was obtained. IMPRESSION: CT guided bone marrow biopsy of right posterior iliac bone with both aspirate and core samples obtained. Electronically Signed   By: GAletta EdouardM.D.   On: 05/22/2018 11:07    Patient Profile     Tim REWERTSis a 84y.o. male with a history of CAD s/p 5V-CABG in  1993 with subsequent stenting to grafts (BMS to prox VG-PDA in 2012, BMS to SVG-OM in 03/2014), severe AS s/p TAVR in 06/2016 with 29 mm Sapien 3 valve, HFrEF with last known EF 40-45%, HTN, RBBB, HLD, hypothyroidism, large cell non-Hodgkins lympoma, BPH s/p thallium laser ablation of his prostate on 12/05/16 with indwelling catheter who presents from OSH with troponinemia in the setting of generalized weakness, leg swelling and hypotension. Of note, has a recent MRI lumbar spine demonstrating multifocal bone lesions most consistent with metastatic carcinoma.   Assessment & Plan    1 elevated troponin-patient denies chest pain.  There is no clear trend. Findings consistent with cardiomyopathy. No plans for cath.  2 cardiomyopathy-echocardiogram shows ejection fraction 30 to 35%.  We will continue with beta-blockade but changed to carvedilol 3.125 mg twice daily.  BP will not allow addition of ACEI/ARB/ARNI. On oral lasix 40 mg daily - good diuresis overnight. Labs stable. Continue this dose and monitor weights.  3 coronary artery disease-continue aspirin and restart statin.  4 MRI suggestive of metastatic carcinoma-further evaluation per primary care.  5 prior aortic valve replacement-stable on echocardiogram.  6 thrombocytopenia-we will continue aspirin 81 mg daily for coronary disease and prior aortic valve replacement but discontinue Plavix.  7. Sustained VT - Some AIVR. Continue amiodarone 400 mg daily for 7 days, then decrease to 200 mg daily.   [facility] will sign off.   Cardiac medication Recommendations:   Lasix 40 mg daily, amiodarone 400 mg daily for 7 days total, then decrease to 200 mg daily, aspirin 81 mg daily, atorvastatin 40 mg QHS, coreg 3.125 mg BID Other recommendations (labs, testing, etc):  none Follow up as an outpatient:  Follow-up with Dr. Tamala Julian after discharge.  Pixie Casino, MD, [facility], [place] Director of the Advanced Lipid Disorders &  Cardiovascular Risk Reduction Clinic Diplomate of the American Board of Clinical Lipidology Attending Cardiologist  Direct Dial: [phone]  Fax: [phone]  Website:  [email or web address]  Pixie Casino, MD  05/22/2018, 12:10 PM

## 2018-05-22 NOTE — Consult Note (Signed)
[facility] CM Primary Care Navigator  05/22/2018  [place] 08-May-1926 [id number]   Went back to see patient after coming out of ICU. Met with patient and daughter Jackelyn Poling) to identify possible discharge needs. Daughter reportsthat patient had swelling to both legs/ feet and generalized weakness that resulted to this admission/ surgery. (Non-ST elevated myocardial infarction, acute on chronic congestive HF exacerbation, thrombocytopenia)  Patient endorsesDr.Robert Inda Merlin with [facility] as his primary care provider.   [facility] [place] [facility].   Daughterreportsthat patient's wife is managing his medications at home using"pill box" system filled weekly.  Patient's daughters Blanch Media, Letta Median and Jackelyn Poling) are providing transportation tohis doctors'appointments and wife usually goes with patient.  According to daughter, wife stays with patient at home and assists with his needs. Daughters (working) will take turns in providing care needs for patient when he gets home.  Anticipated discharge plan isskilled nursing facility (SNF) for rehabilitation per therapy recommendation.  Patient and daughter voiced understanding to call primary care provider's office once he gets back home, for a post discharge follow-up appointment within a1- 2 weeksor sooner if needs arise.Patient letter (with PCP's contact number) wasprovided as a reminder.  Discussed with daughter and patientregarding [facility] CM services available for health managementand resourcesat homeand had indicated interest for it.  Patient's daughterverbalizedunderstandingto seekreferralfrom primary care provider to [facility] care management ifdeemed necessary and appropriatefor services in thenear future- oncepatientreturnsback home.  [facility] care management information was provided for future needsthatpatient may  have.  Primary care provider's office is listed as providing transition of care (TOC) follow-up.    For additional questions please contact:  Edwena Felty A. Bailey Faiella, BSN, RN-BC [facility] PRIMARY CARE Navigator Cell: [phone]

## 2018-05-22 NOTE — Progress Notes (Signed)
I highly suspect that we are looking at metastatic prostate cancer for Tim Campbell.  His PSA was 155.  His bone scan came back positive for bony lesions.  The radiologist report seems to indicate the likelihood of metastatic prostate cancer.  I think he goes for his bone marrow test today.  As do think this is important.  His platelet count continues to improve.  Platelet count 108,000 today.  My only concern right now is that his testosterone level is only 21.  This is quite low.  Hopefully, we are looking at hormone sensitive prostate cancer.  I will go ahead and give him a dose of degarelix today.  This will quickly "chemically castrate" him.  I will start him on Casodex (50 mg p.o. daily) and this should be adequate for his PSA to come down.  I will also start him on Xgeva.  This is to try to help prevent fractures.  I talked to he and his daughter at length this morning.  I explained to them what I thought was going on in the high likelihood of prostate cancer.  I explained how we treat metastatic prostate cancer in men.  Given his age and his performance status, I just do not see where chemotherapy is going to ever be used.  I think that would be incredibly harsh and lead to a decreased quality of life for [name].  I know that studies now seem to indicate that adding a anti-androgen upfront would be also helpful.  Again, given his age and performance status, I think that degarelix and Casodex would be reasonable.  Again, I do not see any issues with respect to his cardiac evaluation and interventions.  His cardiac status is much more of a determinant of his mortality right now than his cancer is.  As such, what ever cardiology feels is necessary for his heart, I would not have a problem with.  We will go ahead and continue to follow him along.  He now is up on [address] at [place], MD  Psalm 100:5

## 2018-05-22 NOTE — Progress Notes (Addendum)
Late entry: Call from [facility] in pharmacy: Pt will need antiandrogen injection today at 1300. This RN will administer (hazardous drug).

## 2018-05-22 NOTE — Progress Notes (Signed)
Physical Therapy Treatment Patient Details Name: Tim Campbell MRN: [id number] DOB: [birth year] Today's Date: 05/22/2018    History of Present Illness Pt is an 84 y.o. male admitted 05/16/18 by referral from oncologist for generalized weakness. Worked up for NSTEMI and CHF exacerbation. 8/13 sustained symptomatic ventricular tachycardia with transfer to ICU. Pt with prostate CA with high suspicion for metasteses to spine. Bone marrow biopsy performed on 8/15, awaiting results. Nuclear bone scan on 8/15 shows diffuse osseous metastatic disease. PMH includes Non Hodgkins lymphoma, TAVR, CABG, peripheral neuropathy, OA, AAA, CAD, R ankle ORIF, L distal radial fx.    PT Comments    Pt slowly progressing with mobility. Requires modA to stand with RW; ambulating short distances with RW and minA for balance. Max encouragement with repeated cues for pt to perform activity as independently as possible. Easily fatigued with minimal activity. Continue to recommend SNF-level therapies to maximize functional mobility and independence. Will follow acutely.   Follow Up Recommendations  SNF;Supervision/Assistance - 24 hour     Equipment Recommendations  (TBD next venue)    Recommendations for Other Services       Precautions / Restrictions      Mobility  Bed Mobility               General bed mobility comments: Received on BSC with NT  Transfers Overall transfer level: Needs assistance Equipment used: Rolling walker (2 wheeled) Transfers: Sit to/from Stand Sit to Stand: Mod assist         General transfer comment: Stood 1x from [facility] and again from low recliner height with RW, requiring modA for trunk elevation; heavy reliance on BUEs to push into standing  Ambulation/Gait Ambulation/Gait assistance: Min assist Gait Distance (Feet): 5 Feet Assistive device: Rolling walker (2 wheeled) Gait Pattern/deviations: Step-to pattern;Trunk flexed Gait velocity: Decreased Gait velocity  interpretation: <1.8 ft/sec, indicate of risk for recurrent falls General Gait Details: Slow, almost shuffling amb with decreased foot clearance; minA to maintain balance. Cues to back all they way to chair before sitting   Stairs             Wheelchair Mobility    Modified Rankin (Stroke Patients Only)       Balance Overall balance assessment: Needs assistance Sitting-balance support: No upper extremity supported;Feet supported Sitting balance-Leahy Scale: Fair     Standing balance support: Bilateral upper extremity supported;During functional activity Standing balance-Leahy Scale: Poor Standing balance comment: Reliant on UE support; dependent on pericare                            Cognition Arousal/Alertness: Awake/alert Behavior During Therapy: WFL for tasks assessed/performed Overall Cognitive Status: Within Functional Limits for tasks assessed                                        Exercises      General Comments General comments (skin integrity, edema, etc.): Multiple family members visiting this session      Pertinent Vitals/Pain Pain Assessment: Faces Faces Pain Scale: Hurts a little bit Pain Location: low back Pain Descriptors / Indicators: Sore;Grimacing Pain Intervention(s): Monitored during session    Home Living                      Prior Function  PT Goals (current goals can now be found in the care plan section) Acute Rehab PT Goals Patient Stated Goal: get stronger PT Goal Formulation: With patient/family Time For Goal Achievement: 06/05/18 Potential to Achieve Goals: Fair Progress towards PT goals: Progressing toward goals    Frequency    Min 3X/week      PT Plan Current plan remains appropriate    Co-evaluation              AM-PAC PT "6 Clicks" Daily Activity  Outcome Measure  Difficulty turning over in bed (including adjusting bedclothes, sheets and blankets)?:  Unable Difficulty moving from lying on back to sitting on the side of the bed? : Unable Difficulty sitting down on and standing up from a chair with arms (e.g., wheelchair, bedside commode, etc,.)?: Unable Help needed moving to and from a bed to chair (including a wheelchair)?: A Little Help needed walking in hospital room?: A Lot Help needed climbing 3-5 steps with a railing? : Total 6 Click Score: 9    End of Session Equipment Utilized During Treatment: Gait belt Activity Tolerance: Patient tolerated treatment well Patient left: in chair;with call bell/phone within reach;with family/visitor present Nurse Communication: Mobility status PT Visit Diagnosis: Unsteadiness on feet (R26.81);Muscle weakness (generalized) (M62.81);Difficulty in walking, not elsewhere classified (R26.2)     Time: 8323-4688 PT Time Calculation (min) (ACUTE ONLY): 15 min  Charges:  $Therapeutic Activity: 8-22 mins                     Mabeline Caras, PT, DPT Acute Rehab Services  Pager: [place] 05/22/2018, 3:55 PM

## 2018-05-22 NOTE — Progress Notes (Signed)
MEDICATION RELATED NOTE   Pharmacy Re:  Denosumab (XGEVA) Indication:  Calcium Replacement  No Known Allergies  Assessment: Patient receiving new therapy with Denosumab which can lower calcium levels.  His serum level this morning was 7.9.  This corrects to 9.4mg /dL for low albumin of 2.1.  I spoke with Dr. Marin Olp and he is in agreement to go ahead and give a 1gm bolus as replacement to put his level around 10mg /dL (corrected).    Plan:  Calcium Gluconate 1gm IV x 1 Will order calcium and phosphorous levels for am review. Would consider supplementing Calcium and Vitamin D orally while on this therapy if he tolerates this ok.    Rober Minion, PharmD., MS Clinical Pharmacist Pager:  [phone] Thank you for allowing pharmacy to be part of this patients care team. 05/22/2018,1:51 PM

## 2018-05-22 NOTE — Sedation Documentation (Signed)
Patient denies pain and is resting comfortably.  

## 2018-05-22 NOTE — Progress Notes (Signed)
F/u scheduled with Pecolia Ades NP @ [facility] - 06/10/18 at 11am.

## 2018-05-23 DIAGNOSIS — I25119 Atherosclerotic heart disease of native coronary artery with unspecified angina pectoris: Secondary | ICD-10-CM | POA: Diagnosis not present

## 2018-05-23 DIAGNOSIS — R079 Chest pain, unspecified: Secondary | ICD-10-CM | POA: Diagnosis not present

## 2018-05-23 DIAGNOSIS — R531 Weakness: Secondary | ICD-10-CM | POA: Diagnosis not present

## 2018-05-23 DIAGNOSIS — D696 Thrombocytopenia, unspecified: Secondary | ICD-10-CM | POA: Diagnosis not present

## 2018-05-23 DIAGNOSIS — R2681 Unsteadiness on feet: Secondary | ICD-10-CM | POA: Diagnosis not present

## 2018-05-23 DIAGNOSIS — G4700 Insomnia, unspecified: Secondary | ICD-10-CM | POA: Diagnosis not present

## 2018-05-23 DIAGNOSIS — M62838 Other muscle spasm: Secondary | ICD-10-CM | POA: Diagnosis not present

## 2018-05-23 DIAGNOSIS — I214 Non-ST elevation (NSTEMI) myocardial infarction: Secondary | ICD-10-CM | POA: Diagnosis not present

## 2018-05-23 DIAGNOSIS — M6281 Muscle weakness (generalized): Secondary | ICD-10-CM | POA: Diagnosis not present

## 2018-05-23 DIAGNOSIS — R600 Localized edema: Secondary | ICD-10-CM | POA: Diagnosis not present

## 2018-05-23 DIAGNOSIS — Z466 Encounter for fitting and adjustment of urinary device: Secondary | ICD-10-CM | POA: Diagnosis not present

## 2018-05-23 DIAGNOSIS — I472 Ventricular tachycardia: Secondary | ICD-10-CM | POA: Diagnosis not present

## 2018-05-23 DIAGNOSIS — R52 Pain, unspecified: Secondary | ICD-10-CM | POA: Diagnosis not present

## 2018-05-23 DIAGNOSIS — I2510 Atherosclerotic heart disease of native coronary artery without angina pectoris: Secondary | ICD-10-CM | POA: Diagnosis not present

## 2018-05-23 DIAGNOSIS — E785 Hyperlipidemia, unspecified: Secondary | ICD-10-CM | POA: Diagnosis not present

## 2018-05-23 DIAGNOSIS — C799 Secondary malignant neoplasm of unspecified site: Secondary | ICD-10-CM

## 2018-05-23 DIAGNOSIS — N390 Urinary tract infection, site not specified: Secondary | ICD-10-CM | POA: Diagnosis not present

## 2018-05-23 DIAGNOSIS — I5023 Acute on chronic systolic (congestive) heart failure: Secondary | ICD-10-CM | POA: Diagnosis not present

## 2018-05-23 DIAGNOSIS — R0602 Shortness of breath: Secondary | ICD-10-CM | POA: Diagnosis not present

## 2018-05-23 DIAGNOSIS — R41841 Cognitive communication deficit: Secondary | ICD-10-CM | POA: Diagnosis not present

## 2018-05-23 DIAGNOSIS — E039 Hypothyroidism, unspecified: Secondary | ICD-10-CM | POA: Diagnosis not present

## 2018-05-23 DIAGNOSIS — I714 Abdominal aortic aneurysm, without rupture: Secondary | ICD-10-CM | POA: Diagnosis not present

## 2018-05-23 DIAGNOSIS — C61 Malignant neoplasm of prostate: Secondary | ICD-10-CM | POA: Diagnosis not present

## 2018-05-23 DIAGNOSIS — C8590 Non-Hodgkin lymphoma, unspecified, unspecified site: Secondary | ICD-10-CM | POA: Diagnosis not present

## 2018-05-23 DIAGNOSIS — N400 Enlarged prostate without lower urinary tract symptoms: Secondary | ICD-10-CM | POA: Diagnosis not present

## 2018-05-23 DIAGNOSIS — I509 Heart failure, unspecified: Secondary | ICD-10-CM | POA: Diagnosis not present

## 2018-05-23 DIAGNOSIS — Z952 Presence of prosthetic heart valve: Secondary | ICD-10-CM | POA: Diagnosis not present

## 2018-05-23 DIAGNOSIS — R278 Other lack of coordination: Secondary | ICD-10-CM | POA: Diagnosis not present

## 2018-05-23 LAB — BASIC METABOLIC PANEL
Anion gap: 5 (ref 5–15)
BUN: 18 mg/dL (ref 8–23)
CO2: 30 mmol/L (ref 22–32)
CREATININE: 0.95 mg/dL (ref 0.61–1.24)
Calcium: 7.8 mg/dL — ABNORMAL LOW (ref 8.9–10.3)
Chloride: 102 mmol/L (ref 98–111)
Glucose, Bld: 100 mg/dL — ABNORMAL HIGH (ref 70–99)
Potassium: 5.2 mmol/L — ABNORMAL HIGH (ref 3.5–5.1)
SODIUM: 137 mmol/L (ref 135–145)

## 2018-05-23 LAB — PHOSPHORUS: Phosphorus: 3.1 mg/dL (ref 2.5–4.6)

## 2018-05-23 MED ORDER — AMIODARONE HCL 400 MG PO TABS: ORAL_TABLET | ORAL | Status: DC

## 2018-05-23 MED ORDER — ACETAMINOPHEN 500 MG PO TABS: 500.0000 mg | ORAL_TABLET | Freq: Four times a day (QID) | ORAL | 0 refills | Status: AC | PRN

## 2018-05-23 MED ORDER — CARVEDILOL 3.125 MG PO TABS: 3.1250 mg | ORAL_TABLET | Freq: Two times a day (BID) | ORAL | Status: DC

## 2018-05-23 MED ORDER — AMOXICILLIN-POT CLAVULANATE 875-125 MG PO TABS: 1.0000 | ORAL_TABLET | Freq: Two times a day (BID) | ORAL | 0 refills | Status: AC

## 2018-05-23 MED ORDER — BICALUTAMIDE 50 MG PO TABS: 50.0000 mg | ORAL_TABLET | Freq: Every day | ORAL | Status: AC

## 2018-05-23 MED ORDER — DRONABINOL 2.5 MG PO CAPS: 2.5000 mg | ORAL_CAPSULE | Freq: Three times a day (TID) | ORAL | 0 refills | Status: AC

## 2018-05-23 MED ORDER — POLYETHYLENE GLYCOL 3350 17 G PO PACK: 17.0000 g | PACK | Freq: Two times a day (BID) | ORAL | 0 refills | Status: AC

## 2018-05-23 MED ORDER — OXYCODONE HCL 5 MG PO TABS: 5.0000 mg | ORAL_TABLET | Freq: Four times a day (QID) | ORAL | 0 refills | Status: AC | PRN

## 2018-05-23 MED ORDER — FOLIC ACID 1 MG PO TABS: 1.0000 mg | ORAL_TABLET | ORAL | Status: AC

## 2018-05-23 MED ORDER — FUROSEMIDE 40 MG PO TABS: 40.0000 mg | ORAL_TABLET | Freq: Every day | ORAL | Status: DC

## 2018-05-23 NOTE — Progress Notes (Signed)
Clinical Social Worker facilitated patient discharge including contacting patient family and facility to confirm patient discharge plans.  Clinical information faxed to facility and family agreeable with plan.  CSW arranged ambulance transport via [facility] to [facility] . [facility] called and pick up time is set for 2pm.   RN to call [phone] (pt will go in rm# [phone]) for report prior to discharge.  Clinical Social Worker will sign off for now as social work intervention is no longer needed. Please consult [place] again if new need arises.  Rhea Pink, MSW, [place]

## 2018-05-23 NOTE — Progress Notes (Signed)
05/23/2018 1515 Pt discharged to [facility] per [facility].  Family at bedside. [place]

## 2018-05-23 NOTE — Progress Notes (Signed)
05/23/2018 3:38 PM Report called to [employer]. [place]

## 2018-05-23 NOTE — Discharge Summary (Signed)
PATIENT DETAILS Name: Tim Campbell Age: 84 y.o. Sex: male Date of Birth: 01-Oct-1926 MRN: [id number]. Admitting Physician: Rise Patience, MD SNK:[id number], Herbie [place], MD  Admit Date: 05/16/2018 Discharge date: 05/23/2018  Recommendations for Outpatient Follow-up:  1. Follow up with PCP in 1-2 weeks 2. Please obtain BMP/CBC in one week 3. Please follow up on the following pending results: Bone marrow biopsy results 4. Please ensure follow-up with cardiology and oncology.  Admitted From:  Home  Disposition: SNF   Home Health: No  Equipment/Devices: Continue Foley catheter that was already present prior to this hospital stay.  Discharge Condition: Stable  CODE STATUS: Partial Code:DNI  Diet recommendation:  Heart Healthy   Brief Summary: See H&P, Labs, Consult and Test reports for all details in brief, Patient is a 84 y.o. male with prior history of CAD status post CABG, TAVR, hypothyroidism, dyslipidemia, history of lymphoma referred by patient's oncologist for evaluation of multiple metastatic lesions seen on MRI and associated low back pain.  Hospital course complicated by with elevated troponin, decompensated systolic heart failure, sustained monomorphic ventricular tachycardia.  See below for further details  Brief Hospital Course: Sustained ventricular tachycardia: Resolved after amiodarone and lidocaine infusion, he is now just on oral amiodarone.  Maintaining sinus rhythm.  Per cardiology, patient is not a candidate for AICD due to advanced malignancy.  Plan is to continue 400 mg of amiodarone for 1 week, followed by 200 mg p.o. daily.  Will need outpatient follow-up with cardiology.  Acute on chronic systolic heart failure (EF 30-35% by TTE on 05/17/2018): Compensated, hardly any lower extremity edema today.  Continue Coreg and Lasix 40 mg daily.  Follow weights, volume status and electrolytes closely while at SNF.  Non-STEMI: Probably type II non-STEMI in the  setting of demand ischemia.  Cardiology does not recommend invasive work-up.  Continue aspirin, statin and beta-blocker.  No no longer on Plavix due to thrombocytopenia.    Metastatic bony/spinal lesions: High suspicion for metastatic prostate cancer, PSA significantly elevated.  Bone marrow biopsy performed on 8/15, awaiting results.  Nuclear bone scan on 8/15 shows diffuse osseous metastatic disease.  Oncology followed closely, has been started on Casodex.  Received 1 dose of degarelix and Xgeva on 8/15.  Spoke with Dr. Marin Olp on 8/16 over the phone, his office will arrange for follow-up.  Bone marrow biopsy is pending and will need to be followed.  Thrombocytopenia: Improved, likely related to underlying malignancy.    Follow BC periodically.  Enterococcus faecalis/Klebsiella pneumonia UTI: Has a chronic Foley catheter in place-probably catheter related UTI.  Previously on IV Rocephin,with plans to discharge on Augmentin to complete a 7-day course.  Hypothyroidism: Continue Synthroid  BPH: Flomax-has a chronic Foley catheter that was in place prior to this hospital stay-this will be continued on discharge.  AAA: Stable for outpatient follow-up  Severe aortic stenosis status post TAVR  Generalized weakness: Secondary to acute illness-underlying malignancy-continue evaluation by rehab services.  Probably SNF on discharge  Procedures/Studies: 8/15>> bone marrow biopsy by radiology  Discharge Diagnoses:  Principal Problem:   VT (ventricular tachycardia) (HCC) Active Problems:   Acute on chronic combined systolic and diastolic heart failure (HCC)   S/P TAVR (transcatheter aortic valve replacement)   Thrombocytopenia (HCC)   Hypothyroidism   AAA (abdominal aortic aneurysm) (HCC)   Non-ST elevated myocardial infarction (HCC)   Acute on chronic systolic CHF (congestive heart failure) ([place])   Acute lower UTI   Bilateral lower extremity edema   Metastatic  cancer  [facility])   Discharge Instructions:  Activity:  As tolerated with Full fall precautions use walker/cane & assistance as needed   Discharge Instructions    (HEART FAILURE PATIENTS) Call MD:  Anytime you have any of the following symptoms: 1) 3 pound weight gain in 24 hours or 5 pounds in 1 week 2) shortness of breath, with or without a dry hacking cough 3) swelling in the hands, feet or stomach 4) if you have to sleep on extra pillows at night in order to breathe.   Complete by:  As directed    Diet - low sodium heart healthy   Complete by:  As directed    Discharge instructions   Complete by:  As directed    Follow with Primary MD  [name], MD in 1 week  Dr Antonieta Pert office will arrange for follow up  Follow at Cardiology Margret Chance NP @ [facility] - 06/10/18 at 11am.   Your bone marrow biopsy is still pending, please follow with Dr Marin Olp regarding this   Please get a complete blood count and chemistry panel checked by your Primary MD at your next visit, and again as instructed by your Primary MD.  Get Medicines reviewed and adjusted: Please take all your medications with you for your next visit with your Primary MD  Laboratory/radiological data: Please request your Primary MD to go over all hospital tests and procedure/radiological results at the follow up, please ask your Primary MD to get all Hospital records sent to his/her office.  In some cases, they will be blood work, cultures and biopsy results pending at the time of your discharge. Please request that your primary care M.D. follows up on these results.  Also Note the following: If you experience worsening of your admission symptoms, develop shortness of breath, life threatening emergency, suicidal or homicidal thoughts you must seek medical attention immediately by calling 911 or calling your MD immediately  if symptoms less severe.  You must read complete instructions/literature along with all the  possible adverse reactions/side effects for all the Medicines you take and that have been prescribed to you. Take any new Medicines after you have completely understood and accpet all the possible adverse reactions/side effects.   Do not drive when taking Pain medications or sleeping medications (Benzodaizepines)  Do not take more than prescribed Pain, Sleep and Anxiety Medications. It is not advisable to combine anxiety,sleep and pain medications without talking with your primary care practitioner  Special Instructions: If you have smoked or chewed Tobacco  in the last 2 yrs please stop smoking, stop any regular Alcohol  and or any Recreational drug use.  Wear Seat belts while driving.  Please note: You were cared for by a hospitalist during your hospital stay. Once you are discharged, your primary care physician will handle any further medical issues. Please note that NO REFILLS for any discharge medications will be authorized once you are discharged, as it is imperative that you return to your primary care physician (or establish a relationship with a primary care physician if you do not have one) for your post hospital discharge needs so that they can reassess your need for medications and monitor your lab values.   Increase activity slowly   Complete by:  As directed      Allergies as of 05/23/2018   No Known Allergies     Medication List    STOP taking these medications   clopidogrel 75 MG tablet Commonly known as:  PLAVIX   metoprolol tartrate 25 MG tablet Commonly known as:  LOPRESSOR     TAKE these medications   acetaminophen 500 MG tablet Commonly known as:  TYLENOL Take 1 tablet (500 mg total) by mouth every 6 (six) hours as needed for mild pain (knee pain). What changed:    when to take this  reasons to take this   amiodarone 400 MG tablet Commonly known as:  PACERONE 400 mg po daily for 4 more doses from 8/16, and then, 200 mg po daily    amoxicillin-clavulanate 875-125 MG tablet Commonly known as:  AUGMENTIN Take 1 tablet by mouth every 12 (twelve) hours for 6 doses.   aspirin EC 81 MG tablet Take 81 mg by mouth daily.   atorvastatin 40 MG tablet Commonly known as:  LIPITOR TAKE 1 TABLET (40 MG TOTAL) BY MOUTH DAILY AT 6 PM. PLEASE SCHEDULE ANNUAL APPOINTMENT THANKS. What changed:  See the new instructions.   B-complex with vitamin C tablet Take 1 tablet by mouth 3 (three) times a week.   bicalutamide 50 MG tablet Commonly known as:  CASODEX Take 1 tablet (50 mg total) by mouth daily.   carvedilol 3.125 MG tablet Commonly known as:  COREG Take 1 tablet (3.125 mg total) by mouth 2 (two) times daily with a meal.   CHLORELLA PO Take 6 tablets by mouth 2 (two) times daily. MED NAME: CHLORELLA   dronabinol 2.5 MG capsule Commonly known as:  MARINOL Take 1 capsule (2.5 mg total) by mouth 3 (three) times daily before meals.   folic acid 1 MG tablet Commonly known as:  FOLVITE Take 1 tablet (1 mg total) by mouth 3 (three) times a week. What changed:    medication strength  how much to take   furosemide 40 MG tablet Commonly known as:  LASIX Take 1 tablet (40 mg total) by mouth daily.   GARLIC PO Take 1 tablet by mouth daily.   levothyroxine 125 MCG tablet Commonly known as:  SYNTHROID, LEVOTHROID Take 125 mcg by mouth daily before breakfast.   Magnesium 200 MG Tabs Take 225 mg by mouth every other day.   nitroGLYCERIN 0.4 MG SL tablet Commonly known as:  NITROSTAT Place 1 tablet (0.4 mg total) under the tongue every 5 (five) minutes as needed for chest pain.   oxyCODONE 5 MG immediate release tablet Commonly known as:  Oxy IR/ROXICODONE Take 1 tablet (5 mg total) by mouth every 6 (six) hours as needed for moderate pain.   polyethylene glycol packet Commonly known as:  MIRALAX / GLYCOLAX Take 17 g by mouth 2 (two) times daily.   tamsulosin 0.4 MG Caps capsule Commonly known as:  FLOMAX Take  0.4 mg by mouth 2 (two) times daily.   tiZANidine 2 MG tablet Commonly known as:  ZANAFLEX Take 2 mg by mouth every 6 (six) hours as needed (rest).   vitamin E 100 UNIT capsule Take 100 Units by mouth 3 (three) times a week.      Follow-up Information    Daune Perch, NP Follow up.   Specialty:  Nurse Practitioner Why:  [facility] - 06/10/18 at 11am. Please arrive 15 minutes prior to appointment to check in. Pecolia Ades is one of the nurse practitioners who works with Dr. Tamala Julian. Contact information: [address] [place] [address] [phone]        [name], MD. Schedule an appointment as soon as possible for a visit in 1 week(s).  Specialty:  Internal Medicine Contact information: [address] [place] [address] [phone]        Volanda Napoleon, MD Follow up.   Specialty:  Oncology Why:  office will call with follow up appoitment Contact information: [address] [place] [address] [phone]          No Known Allergies    Consultations:  Cardiology, oncology, and interventional radiology   Other Procedures/Studies: Dg Chest 2 View  Result Date: 05/16/2018 CLINICAL DATA:  Acute lower extremity swelling. No fever. Cough and congestion. EXAM: CHEST - 2 VIEW COMPARISON:  12/17/2016 FINDINGS: There changes from previous cardiac surgery and aortic valve replacement. Cardiac silhouette is mildly enlarged. No mediastinal or hilar masses. No evidence of adenopathy. Small pleural effusions. Prominent bronchovascular markings without overt pulmonary edema. No evidence of pneumonia. No pneumothorax. Skeletal structures are demineralized but grossly intact. IMPRESSION: 1. No acute cardiopulmonary disease. 2. Mild cardiomegaly with stable changes from prior cardiac surgery. 3. Small pleural effusions. Electronically Signed   By: Lajean Manes M.D.   On: 05/16/2018 17:35   Ct Head Wo  Contrast  Result Date: 05/18/2018 CLINICAL DATA:  Metastatic spine tumor suspected EXAM: CT HEAD WITHOUT CONTRAST TECHNIQUE: Contiguous axial images were obtained from the base of the skull through the vertex without intravenous contrast. COMPARISON:  Brain MRI 03/02/2005 FINDINGS: Brain: Bilateral inferior frontal gliosis with pattern suggesting remote contusion, stable since 2006 at least. Partly calcified far anterior right parafalcine mass measuring 13 mm. In retrospect there was meningioma in this location measuring 6 mm in 2006. Mild for age cerebral volume loss. No evidence of acute infarct, hemorrhage, hydrocephalus, or collection. Vascular: Atherosclerotic calcification.  No hyperdense vessel Skull: No acute finding. Sinuses/Orbits: Bilateral cataract resection. IMPRESSION: 1. No acute finding. 2. 13 mm anterior falcine meningioma with mild growth since 2006. 3. Remote bilateral inferior frontal contusion. Electronically Signed   By: Monte Fantasia M.D.   On: 05/18/2018 12:24   Ct Chest W Contrast  Result Date: 05/19/2018 CLINICAL DATA:  Thrombocytopenia.  Past history of lymphoma. EXAM: CT CHEST, ABDOMEN, AND PELVIS WITH CONTRAST TECHNIQUE: Multidetector CT imaging of the chest, abdomen and pelvis was performed following the standard protocol during bolus administration of intravenous contrast. CONTRAST:  178m OMNIPAQUE IOHEXOL 300 MG/ML  SOLN COMPARISON:  Multiple exams, including chest CT from 01/04/2017 and abdomen CT from 06/06/2016 FINDINGS: CT CHEST FINDINGS Cardiovascular: Coronary, aortic arch, and branch vessel atherosclerotic vascular disease. Aortic valve prosthesis. Prior CABG. Mild cardiomegaly. Mediastinum/Nodes: Small mediastinal lymph nodes are not pathologically enlarged by size criteria. Lungs/Pleura: Moderate bilateral pleural effusions, nonspecific for transudative or exudative etiology. Associated passive atelectasis noted. The previous left lower lobe pulmonary nodule  referenced on the exam from 01/04/2017 is within atelectatic lung and thus completely obscured if still present. Musculoskeletal: Prominent degenerative right glenohumeral arthropathy. Multiple healed bilateral rib fractures. There are new bilateral sclerotic rib lesions of considerable suspicion for malignancy, including a 5.3 cm long area of sclerosis in the left seventh rib anteriorly, among others. Thoracic spondylosis with multilevel bridging spurring anteriorly. CT ABDOMEN PELVIS FINDINGS Hepatobiliary: Mildly contracted gallbladder. There is potentially mild gallbladder wall thickening. No focal hepatic parenchymal lesion is identified. Pancreas: Unremarkable Spleen: Faintly heterogeneous enhancement of the spleen on the later arterial phase images. The spleen measures proximally on 0.9 by 6.6 by 10.1 cm. Delayed images include the lower half of the spleen and appear relatively homogeneous. Adrenals/Urinary Tract: Mild left and  borderline right hydronephrosis with moderate bilateral hydroureter extending down to the urinary bladder. Notable wall thickening in the urinary bladder favoring cystitis. A Foley catheter is present with balloon inflated in the urinary bladder. Right renal cysts noted. There is a linear 5 mm left kidney lower pole nonobstructive renal calculus on image 87/6. Mild scarring of the left kidney. Stomach/Bowel: Proximal sigmoid colon diverticulosis. Vascular/Lymphatic: Aortoiliac atherosclerotic vascular disease. Infrarenal abdominal aortic aneurysm 3.4 cm in diameter, formerly 3.3 Cm. Rim calcified structure in the right abdomen just medial to the SVC on image 57/3, probably a calcified lymph node and less likely to be a small thrombosed aneurysm. Reproductive: Mild prostatomegaly. Other: Increase in diffuse mesenteric and perirenal stranding along with trace fluid in both paracolic gutters. Presacral edema. There is notable edema surrounding the urinary bladder wall. There is also  subcutaneous edema along the pubis. Mild flank edema bilaterally. Musculoskeletal: New 1.5 cm sclerotic lesion eccentric to the right in S1 level of the sacrum. New sclerotic lesions in the left ilium in ischium, in the right supra-acetabular region, and in the right ischium. New sclerotic lesion in the left intertrochanteric region measuring about 1.8 cm in diameter. IMPRESSION: 1. Scattered new sclerotic lesions in the skeleton especially in the ribs and bony pelvis, suspicious for malignancy. 2. Notable third spacing of fluid with moderate bilateral pleural effusions, flank edema, mesenteric and presacral edema, and a small amount of fluid in the paracolic gutters. 3. Atelectasis obscures the region of the prior left lower lobe pulmonary nodule. Size of this nodule if it is still present at all unknown. 4. Notably thickened urinary bladder, suspicious for cystitis. Moderate dilation of both ureters extending towards the urinary bladder without obstructive stone seen. 5. 3.4 cm infrarenal abdominal aortic aneurysm. Recommend followup by [place] in 3 years. This recommendation follows ACR consensus guidelines: White Paper of the ACR Incidental Findings Committee II on Vascular Findings. J Am Coll Radiol 2013; 10:789-794. Aortic aneurysm NOS (ICD10-I71.9). 6. Other imaging findings of potential clinical significance: Aortic Atherosclerosis (ICD10-I70.0). Coronary atherosclerosis. Aortic valve prosthesis. Prior CABG. Mild cardiomegaly. Prominent right glenohumeral arthropathy. Multiple old healed rib fractures. Questionable mild gallbladder wall thickening. Nonobstructive left nephrolithiasis. Proximal sigmoid colon diverticulosis. Mild prostatomegaly. Electronically Signed   By: Van Clines M.D.   On: 05/19/2018 13:35   Mr Lumbar Spine Wo Contrast  Result Date: 05/11/2018 CLINICAL DATA:  Back pain. Left leg and hip pain over the last 3 weeks. Distant history of lymphoma. EXAM: MRI LUMBAR SPINE WITHOUT CONTRAST  TECHNIQUE: Multiplanar, multisequence MR imaging of the lumbar spine was performed. No intravenous contrast was administered. COMPARISON:  CT 06/06/2016. FINDINGS: Segmentation:  5 lumbar type vertebral bodies. Alignment:  No significant malalignment. Vertebrae: Multiple marrow space lesions scattered throughout the region consistent with metastatic carcinoma or possibly multifocal lymphoma. There are foci of involvement at T11, T12, L1, L2, L3, L4 and the sacrum. Both iliac bones are involved. No extraosseous tumor is seen. No tumor encroachment upon the spinal canal or neural foramina. Conus medullaris and cauda equina: Conus extends to the L1 level. Conus and cauda equina appear normal. Paraspinal and other soft tissues: Distended bladder with trabeculation. Bilateral hydroureteronephrosis, most likely secondary to chronic bladder outlet obstruction. Mild aneurysmal dilatation of the infrarenal abdominal aorta. Maximal diameter 3.3 cm. Disc levels: T12-L1: Mild bulging of the disc.  No compressive stenosis. L1-2: Normal disc space. L2-3: Endplate osteophytes and bulging of the disc. Mild stenosis of both lateral recesses but no definite neural compression. L3-4:  Endplate osteophytes and bulging of the disc. Mild stenosis of the lateral recesses. Some potential for neural compression particularly on the right. L4-5: Endplate osteophytes and bulging of the disc. Facet and ligamentous hypertrophy. Stenosis of the lateral recesses right more than left. Some potential for neural compression on the right. L5-S1: Endplate osteophytes and bulging of the disc. No central canal stenosis. No compressive foraminal stenosis. IMPRESSION: Multifocal bone lesions scattered throughout the lower thoracic spine, sacrum and iliac bones most consistent with metastatic carcinoma. Multifocal lymphoma can have this appearance. No pathologic fracture. No extraosseous tumor or apparent effect upon the neural structures. Distended bladder.  Bilateral hydroureteronephrosis. Findings suggest chronic bladder outlet obstruction. Infrarenal abdominal aortic aneurysm maximal diameter 3.3 cm. Recommend followup by ultrasound in 3 years. This recommendation follows ACR consensus guidelines: White Paper of the ACR Incidental Findings Committee II on Vascular Findings. 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 No compressive stenosis seen to explain left leg symptoms. Mild right more than left lateral recess stenosis at L3-4 and L4-5 that could possibly cause right-sided symptoms. These results will be called to the ordering clinician or representative by the Radiologist Assistant, and communication documented in the PACS or zVision Dashboard. Routine call during normal Monday hours is planned. Electronically Signed   By: Nelson Chimes M.D.   On: 05/11/2018 14:23   Nm Bone Scan Whole Body  Result Date: 05/21/2018 CLINICAL DATA:  Back pain. History of prostate cancer. Abnormal recent lumbar spine MRI. EXAM: NUCLEAR MEDICINE WHOLE BODY BONE SCAN TECHNIQUE: Whole body anterior and posterior images were obtained approximately 3 hours after intravenous injection of radiopharmaceutical. RADIOPHARMACEUTICALS:  22.0 mCi Technetium-39mMDP IV COMPARISON:  Lumbar spine MRI 05/11/2018 and CT abdomen/pelvis 05/19/2018 FINDINGS: As demonstrated on the CT scan and MRI there are numerous bone lesions throughout the axial and appendicular skeleton most consistent with prostate cancer metastasis. This most significantly involves the left hemipelvis, ribs, spine and both femurs. IMPRESSION: Diffuse osseous metastatic disease, likely prostate cancer. Electronically Signed   By: PMarijo SanesM.D.   On: 05/21/2018 16:03   Ct Abdomen Pelvis W Contrast  Result Date: 05/19/2018 CLINICAL DATA:  Thrombocytopenia.  Past history of lymphoma. EXAM: CT CHEST, ABDOMEN, AND PELVIS WITH CONTRAST TECHNIQUE:  Multidetector CT imaging of the chest, abdomen and pelvis was performed following the standard protocol during bolus administration of intravenous contrast. CONTRAST:  1035mOMNIPAQUE IOHEXOL 300 MG/ML  SOLN COMPARISON:  Multiple exams, including chest CT from 01/04/2017 and abdomen CT from 06/06/2016 FINDINGS: CT CHEST FINDINGS Cardiovascular: Coronary, aortic arch, and branch vessel atherosclerotic vascular disease. Aortic valve prosthesis. Prior CABG. Mild cardiomegaly. Mediastinum/Nodes: Small mediastinal lymph nodes are not pathologically enlarged by size criteria. Lungs/Pleura: Moderate bilateral pleural effusions, nonspecific for transudative or exudative etiology. Associated passive atelectasis noted. The previous left lower lobe pulmonary nodule referenced on the exam from 01/04/2017 is within atelectatic lung and thus completely obscured if still present. Musculoskeletal: Prominent degenerative right glenohumeral arthropathy. Multiple healed bilateral rib fractures. There are new bilateral sclerotic rib lesions of considerable suspicion for malignancy, including a 5.3 cm long area of sclerosis in the left seventh rib anteriorly, among others. Thoracic spondylosis with multilevel bridging spurring anteriorly. CT ABDOMEN PELVIS FINDINGS Hepatobiliary: Mildly contracted gallbladder. There is potentially mild gallbladder wall thickening. No focal hepatic parenchymal lesion is identified. Pancreas: Unremarkable Spleen: Faintly heterogeneous enhancement of the spleen on the later arterial phase images. The spleen measures proximally on 0.9  by 6.6 by 10.1 cm. Delayed images include the lower half of the spleen and appear relatively homogeneous. Adrenals/Urinary Tract: Mild left and borderline right hydronephrosis with moderate bilateral hydroureter extending down to the urinary bladder. Notable wall thickening in the urinary bladder favoring cystitis. A Foley catheter is present with balloon inflated in the  urinary bladder. Right renal cysts noted. There is a linear 5 mm left kidney lower pole nonobstructive renal calculus on image 87/6. Mild scarring of the left kidney. Stomach/Bowel: Proximal sigmoid colon diverticulosis. Vascular/Lymphatic: Aortoiliac atherosclerotic vascular disease. Infrarenal abdominal aortic aneurysm 3.4 cm in diameter, formerly 3.3 Cm. Rim calcified structure in the right abdomen just medial to the SVC on image 57/3, probably a calcified lymph node and less likely to be a small thrombosed aneurysm. Reproductive: Mild prostatomegaly. Other: Increase in diffuse mesenteric and perirenal stranding along with trace fluid in both paracolic gutters. Presacral edema. There is notable edema surrounding the urinary bladder wall. There is also subcutaneous edema along the pubis. Mild flank edema bilaterally. Musculoskeletal: New 1.5 cm sclerotic lesion eccentric to the right in S1 level of the sacrum. New sclerotic lesions in the left ilium in ischium, in the right supra-acetabular region, and in the right ischium. New sclerotic lesion in the left intertrochanteric region measuring about 1.8 cm in diameter. IMPRESSION: 1. Scattered new sclerotic lesions in the skeleton especially in the ribs and bony pelvis, suspicious for malignancy. 2. Notable third spacing of fluid with moderate bilateral pleural effusions, flank edema, mesenteric and presacral edema, and a small amount of fluid in the paracolic gutters. 3. Atelectasis obscures the region of the prior left lower lobe pulmonary nodule. Size of this nodule if it is still present at all unknown. 4. Notably thickened urinary bladder, suspicious for cystitis. Moderate dilation of both ureters extending towards the urinary bladder without obstructive stone seen. 5. 3.4 cm infrarenal abdominal aortic aneurysm. Recommend followup by [place] in 3 years. This recommendation follows ACR consensus guidelines: White Paper of the ACR Incidental Findings Committee II on  Vascular Findings. J Am Coll Radiol 2013; 10:789-794. Aortic aneurysm NOS (ICD10-I71.9). 6. Other imaging findings of potential clinical significance: Aortic Atherosclerosis (ICD10-I70.0). Coronary atherosclerosis. Aortic valve prosthesis. Prior CABG. Mild cardiomegaly. Prominent right glenohumeral arthropathy. Multiple old healed rib fractures. Questionable mild gallbladder wall thickening. Nonobstructive left nephrolithiasis. Proximal sigmoid colon diverticulosis. Mild prostatomegaly. Electronically Signed   By: Van Clines M.D.   On: 05/19/2018 13:35   Ct Bone Marrow Biopsy & Aspiration  Result Date: 05/22/2018 CLINICAL DATA:  Thrombocytopenia and prior history of lymphoma. Bone marrow biopsy requested for further workup. EXAM: CT GUIDED BONE MARROW ASPIRATION AND BIOPSY ANESTHESIA/SEDATION: Versed 1.5 mg IV, Fentanyl 75 mcg IV Total Moderate Sedation Time:   13 minutes. The patient's level of consciousness and physiologic status were continuously monitored during the procedure by Radiology nursing. PROCEDURE: The procedure risks, benefits, and alternatives were explained to the patient. Questions regarding the procedure were encouraged and answered. The patient understands and consents to the procedure. A time out was performed prior to initiating the procedure. The right gluteal region was prepped with chlorhexidine. Sterile gown and sterile gloves were used for the procedure. Local anesthesia was provided with 1% Lidocaine. Under CT guidance, an 11 gauge On Control bone cutting needle was advanced from a posterior approach into the right iliac bone. Needle positioning was confirmed with CT. Initial non heparinized and heparinized aspirate samples were obtained of bone marrow. Core biopsy was performed via the On Control drill  needle. COMPLICATIONS: None FINDINGS: Inspection of initial aspirate did reveal visible particles. Intact core biopsy sample was obtained. IMPRESSION: CT guided bone marrow  biopsy of right posterior iliac bone with both aspirate and core samples obtained. Electronically Signed   By: Aletta Edouard M.D.   On: 05/22/2018 11:07      TODAY-DAY OF DISCHARGE:  Subjective:   Tim Campbell today has no headache,no chest abdominal pain,no new weakness tingling or numbness, feels much better wants to go home today.   Objective:   Blood pressure (!) 116/58, pulse 71, temperature (!) 97.5 F (36.4 C), temperature source Oral, resp. rate 19, height _0  (1.778 m), weight 93.8 kg, SpO2 98 %.  Intake/Output Summary (Last 24 hours) at 05/23/2018 0754 Last data filed at 05/23/2018 0457 Gross per 24 hour  Intake 200 ml  Output 475 ml  Net -275 ml   Filed Weights   05/21/18 0400 05/22/18 0457 05/23/18 0636  Weight: 96.5 kg 94.3 kg 93.8 kg    Exam: Awake Alert, Oriented *3, No new F.N deficits, Normal affect [place].AT,PERRAL Supple Neck,No JVD, No cervical lymphadenopathy appriciated.  Symmetrical Chest wall movement, Good air movement bilaterally, CTAB RRR,No Gallops,Rubs or new Murmurs, No Parasternal Heave +ve B.Sounds, Abd Soft, Non tender, No organomegaly appriciated, No rebound -guarding or rigidity. No Cyanosis, Clubbing or edema, No new Rash or bruise   PERTINENT RADIOLOGIC STUDIES: Dg Chest 2 View  Result Date: 05/16/2018 CLINICAL DATA:  Acute lower extremity swelling. No fever. Cough and congestion. EXAM: CHEST - 2 VIEW COMPARISON:  12/17/2016 FINDINGS: There changes from previous cardiac surgery and aortic valve replacement. Cardiac silhouette is mildly enlarged. No mediastinal or hilar masses. No evidence of adenopathy. Small pleural effusions. Prominent bronchovascular markings without overt pulmonary edema. No evidence of pneumonia. No pneumothorax. Skeletal structures are demineralized but grossly intact. IMPRESSION: 1. No acute cardiopulmonary disease. 2. Mild cardiomegaly with stable changes from prior cardiac surgery. 3. Small pleural effusions.  Electronically Signed   By: Lajean Manes M.D.   On: 05/16/2018 17:35   Ct Head Wo Contrast  Result Date: 05/18/2018 CLINICAL DATA:  Metastatic spine tumor suspected EXAM: CT HEAD WITHOUT CONTRAST TECHNIQUE: Contiguous axial images were obtained from the base of the skull through the vertex without intravenous contrast. COMPARISON:  Brain MRI 03/02/2005 FINDINGS: Brain: Bilateral inferior frontal gliosis with pattern suggesting remote contusion, stable since 2006 at least. Partly calcified far anterior right parafalcine mass measuring 13 mm. In retrospect there was meningioma in this location measuring 6 mm in 2006. Mild for age cerebral volume loss. No evidence of acute infarct, hemorrhage, hydrocephalus, or collection. Vascular: Atherosclerotic calcification.  No hyperdense vessel Skull: No acute finding. Sinuses/Orbits: Bilateral cataract resection. IMPRESSION: 1. No acute finding. 2. 13 mm anterior falcine meningioma with mild growth since 2006. 3. Remote bilateral inferior frontal contusion. Electronically Signed   By: Monte Fantasia M.D.   On: 05/18/2018 12:24   Ct Chest W Contrast  Result Date: 05/19/2018 CLINICAL DATA:  Thrombocytopenia.  Past history of lymphoma. EXAM: CT CHEST, ABDOMEN, AND PELVIS WITH CONTRAST TECHNIQUE: Multidetector CT imaging of the chest, abdomen and pelvis was performed following the standard protocol during bolus administration of intravenous contrast. CONTRAST:  117m OMNIPAQUE IOHEXOL 300 MG/ML  SOLN COMPARISON:  Multiple exams, including chest CT from 01/04/2017 and abdomen CT from 06/06/2016 FINDINGS: CT CHEST FINDINGS Cardiovascular: Coronary, aortic arch, and branch vessel atherosclerotic vascular disease. Aortic valve prosthesis. Prior CABG. Mild cardiomegaly. Mediastinum/Nodes: Small mediastinal lymph nodes are not  pathologically enlarged by size criteria. Lungs/Pleura: Moderate bilateral pleural effusions, nonspecific for transudative or exudative etiology.  Associated passive atelectasis noted. The previous left lower lobe pulmonary nodule referenced on the exam from 01/04/2017 is within atelectatic lung and thus completely obscured if still present. Musculoskeletal: Prominent degenerative right glenohumeral arthropathy. Multiple healed bilateral rib fractures. There are new bilateral sclerotic rib lesions of considerable suspicion for malignancy, including a 5.3 cm long area of sclerosis in the left seventh rib anteriorly, among others. Thoracic spondylosis with multilevel bridging spurring anteriorly. CT ABDOMEN PELVIS FINDINGS Hepatobiliary: Mildly contracted gallbladder. There is potentially mild gallbladder wall thickening. No focal hepatic parenchymal lesion is identified. Pancreas: Unremarkable Spleen: Faintly heterogeneous enhancement of the spleen on the later arterial phase images. The spleen measures proximally on 0.9 by 6.6 by 10.1 cm. Delayed images include the lower half of the spleen and appear relatively homogeneous. Adrenals/Urinary Tract: Mild left and borderline right hydronephrosis with moderate bilateral hydroureter extending down to the urinary bladder. Notable wall thickening in the urinary bladder favoring cystitis. A Foley catheter is present with balloon inflated in the urinary bladder. Right renal cysts noted. There is a linear 5 mm left kidney lower pole nonobstructive renal calculus on image 87/6. Mild scarring of the left kidney. Stomach/Bowel: Proximal sigmoid colon diverticulosis. Vascular/Lymphatic: Aortoiliac atherosclerotic vascular disease. Infrarenal abdominal aortic aneurysm 3.4 cm in diameter, formerly 3.3 Cm. Rim calcified structure in the right abdomen just medial to the SVC on image 57/3, probably a calcified lymph node and less likely to be a small thrombosed aneurysm. Reproductive: Mild prostatomegaly. Other: Increase in diffuse mesenteric and perirenal stranding along with trace fluid in both paracolic gutters. Presacral  edema. There is notable edema surrounding the urinary bladder wall. There is also subcutaneous edema along the pubis. Mild flank edema bilaterally. Musculoskeletal: New 1.5 cm sclerotic lesion eccentric to the right in S1 level of the sacrum. New sclerotic lesions in the left ilium in ischium, in the right supra-acetabular region, and in the right ischium. New sclerotic lesion in the left intertrochanteric region measuring about 1.8 cm in diameter. IMPRESSION: 1. Scattered new sclerotic lesions in the skeleton especially in the ribs and bony pelvis, suspicious for malignancy. 2. Notable third spacing of fluid with moderate bilateral pleural effusions, flank edema, mesenteric and presacral edema, and a small amount of fluid in the paracolic gutters. 3. Atelectasis obscures the region of the prior left lower lobe pulmonary nodule. Size of this nodule if it is still present at all unknown. 4. Notably thickened urinary bladder, suspicious for cystitis. Moderate dilation of both ureters extending towards the urinary bladder without obstructive stone seen. 5. 3.4 cm infrarenal abdominal aortic aneurysm. Recommend followup by [place] in 3 years. This recommendation follows ACR consensus guidelines: White Paper of the ACR Incidental Findings Committee II on Vascular Findings. J Am Coll Radiol 2013; 10:789-794. Aortic aneurysm NOS (ICD10-I71.9). 6. Other imaging findings of potential clinical significance: Aortic Atherosclerosis (ICD10-I70.0). Coronary atherosclerosis. Aortic valve prosthesis. Prior CABG. Mild cardiomegaly. Prominent right glenohumeral arthropathy. Multiple old healed rib fractures. Questionable mild gallbladder wall thickening. Nonobstructive left nephrolithiasis. Proximal sigmoid colon diverticulosis. Mild prostatomegaly. Electronically Signed   By: Van Clines M.D.   On: 05/19/2018 13:35   Mr Lumbar Spine Wo Contrast  Result Date: 05/11/2018 CLINICAL DATA:  Back pain. Left leg and hip pain over the  last 3 weeks. Distant history of lymphoma. EXAM: MRI LUMBAR SPINE WITHOUT CONTRAST TECHNIQUE: Multiplanar, multisequence MR imaging of the lumbar spine was performed. No  intravenous contrast was administered. COMPARISON:  CT 06/06/2016. FINDINGS: Segmentation:  5 lumbar type vertebral bodies. Alignment:  No significant malalignment. Vertebrae: Multiple marrow space lesions scattered throughout the region consistent with metastatic carcinoma or possibly multifocal lymphoma. There are foci of involvement at T11, T12, L1, L2, L3, L4 and the sacrum. Both iliac bones are involved. No extraosseous tumor is seen. No tumor encroachment upon the spinal canal or neural foramina. Conus medullaris and cauda equina: Conus extends to the L1 level. Conus and cauda equina appear normal. Paraspinal and other soft tissues: Distended bladder with trabeculation. Bilateral hydroureteronephrosis, most likely secondary to chronic bladder outlet obstruction. Mild aneurysmal dilatation of the infrarenal abdominal aorta. Maximal diameter 3.3 cm. Disc levels: T12-L1: Mild bulging of the disc.  No compressive stenosis. L1-2: Normal disc space. L2-3: Endplate osteophytes and bulging of the disc. Mild stenosis of both lateral recesses but no definite neural compression. L3-4: Endplate osteophytes and bulging of the disc. Mild stenosis of the lateral recesses. Some potential for neural compression particularly on the right. L4-5: Endplate osteophytes and bulging of the disc. Facet and ligamentous hypertrophy. Stenosis of the lateral recesses right more than left. Some potential for neural compression on the right. L5-S1: Endplate osteophytes and bulging of the disc. No central canal stenosis. No compressive foraminal stenosis. IMPRESSION: Multifocal bone lesions scattered throughout the lower thoracic spine, sacrum and iliac bones most consistent with metastatic carcinoma. Multifocal lymphoma can have this appearance. No pathologic fracture. No  extraosseous tumor or apparent effect upon the neural structures. Distended bladder. Bilateral hydroureteronephrosis. Findings suggest chronic bladder outlet obstruction. Infrarenal abdominal aortic aneurysm maximal diameter 3.3 cm. Recommend followup by ultrasound in 3 years. This recommendation follows ACR consensus guidelines: White Paper of the ACR Incidental Findings Committee II on Vascular Findings. 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 No compressive stenosis seen to explain left leg symptoms. Mild right more than left lateral recess stenosis at L3-4 and L4-5 that could possibly cause right-sided symptoms. These results will be called to the ordering clinician or representative by the Radiologist Assistant, and communication documented in the PACS or zVision Dashboard. Routine call during normal Monday hours is planned. Electronically Signed   By: Nelson Chimes M.D.   On: 05/11/2018 14:23   Nm Bone Scan Whole Body  Result Date: 05/21/2018 CLINICAL DATA:  Back pain. History of prostate cancer. Abnormal recent lumbar spine MRI. EXAM: NUCLEAR MEDICINE WHOLE BODY BONE SCAN TECHNIQUE: Whole body anterior and posterior images were obtained approximately 3 hours after intravenous injection of radiopharmaceutical. RADIOPHARMACEUTICALS:  22.0 mCi Technetium-39mMDP IV COMPARISON:  Lumbar spine MRI 05/11/2018 and CT abdomen/pelvis 05/19/2018 FINDINGS: As demonstrated on the CT scan and MRI there are numerous bone lesions throughout the axial and appendicular skeleton most consistent with prostate cancer metastasis. This most significantly involves the left hemipelvis, ribs, spine and both femurs. IMPRESSION: Diffuse osseous metastatic disease, likely prostate cancer. Electronically Signed   By: PMarijo SanesM.D.   On: 05/21/2018 16:03   Ct Abdomen Pelvis W Contrast  Result Date: 05/19/2018 CLINICAL DATA:  Thrombocytopenia.  Past  history of lymphoma. EXAM: CT CHEST, ABDOMEN, AND PELVIS WITH CONTRAST TECHNIQUE: Multidetector CT imaging of the chest, abdomen and pelvis was performed following the standard protocol during bolus administration of intravenous contrast. CONTRAST:  1023mOMNIPAQUE IOHEXOL 300 MG/ML  SOLN COMPARISON:  Multiple exams, including chest CT from 01/04/2017 and abdomen CT from 06/06/2016 FINDINGS: CT CHEST FINDINGS  Cardiovascular: Coronary, aortic arch, and branch vessel atherosclerotic vascular disease. Aortic valve prosthesis. Prior CABG. Mild cardiomegaly. Mediastinum/Nodes: Small mediastinal lymph nodes are not pathologically enlarged by size criteria. Lungs/Pleura: Moderate bilateral pleural effusions, nonspecific for transudative or exudative etiology. Associated passive atelectasis noted. The previous left lower lobe pulmonary nodule referenced on the exam from 01/04/2017 is within atelectatic lung and thus completely obscured if still present. Musculoskeletal: Prominent degenerative right glenohumeral arthropathy. Multiple healed bilateral rib fractures. There are new bilateral sclerotic rib lesions of considerable suspicion for malignancy, including a 5.3 cm long area of sclerosis in the left seventh rib anteriorly, among others. Thoracic spondylosis with multilevel bridging spurring anteriorly. CT ABDOMEN PELVIS FINDINGS Hepatobiliary: Mildly contracted gallbladder. There is potentially mild gallbladder wall thickening. No focal hepatic parenchymal lesion is identified. Pancreas: Unremarkable Spleen: Faintly heterogeneous enhancement of the spleen on the later arterial phase images. The spleen measures proximally on 0.9 by 6.6 by 10.1 cm. Delayed images include the lower half of the spleen and appear relatively homogeneous. Adrenals/Urinary Tract: Mild left and borderline right hydronephrosis with moderate bilateral hydroureter extending down to the urinary bladder. Notable wall thickening in the urinary bladder  favoring cystitis. A Foley catheter is present with balloon inflated in the urinary bladder. Right renal cysts noted. There is a linear 5 mm left kidney lower pole nonobstructive renal calculus on image 87/6. Mild scarring of the left kidney. Stomach/Bowel: Proximal sigmoid colon diverticulosis. Vascular/Lymphatic: Aortoiliac atherosclerotic vascular disease. Infrarenal abdominal aortic aneurysm 3.4 cm in diameter, formerly 3.3 Cm. Rim calcified structure in the right abdomen just medial to the SVC on image 57/3, probably a calcified lymph node and less likely to be a small thrombosed aneurysm. Reproductive: Mild prostatomegaly. Other: Increase in diffuse mesenteric and perirenal stranding along with trace fluid in both paracolic gutters. Presacral edema. There is notable edema surrounding the urinary bladder wall. There is also subcutaneous edema along the pubis. Mild flank edema bilaterally. Musculoskeletal: New 1.5 cm sclerotic lesion eccentric to the right in S1 level of the sacrum. New sclerotic lesions in the left ilium in ischium, in the right supra-acetabular region, and in the right ischium. New sclerotic lesion in the left intertrochanteric region measuring about 1.8 cm in diameter. IMPRESSION: 1. Scattered new sclerotic lesions in the skeleton especially in the ribs and bony pelvis, suspicious for malignancy. 2. Notable third spacing of fluid with moderate bilateral pleural effusions, flank edema, mesenteric and presacral edema, and a small amount of fluid in the paracolic gutters. 3. Atelectasis obscures the region of the prior left lower lobe pulmonary nodule. Size of this nodule if it is still present at all unknown. 4. Notably thickened urinary bladder, suspicious for cystitis. Moderate dilation of both ureters extending towards the urinary bladder without obstructive stone seen. 5. 3.4 cm infrarenal abdominal aortic aneurysm. Recommend followup by [place] in 3 years. This recommendation follows ACR  consensus guidelines: White Paper of the ACR Incidental Findings Committee II on Vascular Findings. J Am Coll Radiol 2013; 10:789-794. Aortic aneurysm NOS (ICD10-I71.9). 6. Other imaging findings of potential clinical significance: Aortic Atherosclerosis (ICD10-I70.0). Coronary atherosclerosis. Aortic valve prosthesis. Prior CABG. Mild cardiomegaly. Prominent right glenohumeral arthropathy. Multiple old healed rib fractures. Questionable mild gallbladder wall thickening. Nonobstructive left nephrolithiasis. Proximal sigmoid colon diverticulosis. Mild prostatomegaly. Electronically Signed   By: Van Clines M.D.   On: 05/19/2018 13:35   Ct Bone Marrow Biopsy & Aspiration  Result Date: 05/22/2018 CLINICAL DATA:  Thrombocytopenia and prior history of lymphoma. Bone marrow biopsy requested  for further workup. EXAM: CT GUIDED BONE MARROW ASPIRATION AND BIOPSY ANESTHESIA/SEDATION: Versed 1.5 mg IV, Fentanyl 75 mcg IV Total Moderate Sedation Time:   13 minutes. The patient's level of consciousness and physiologic status were continuously monitored during the procedure by Radiology nursing. PROCEDURE: The procedure risks, benefits, and alternatives were explained to the patient. Questions regarding the procedure were encouraged and answered. The patient understands and consents to the procedure. A time out was performed prior to initiating the procedure. The right gluteal region was prepped with chlorhexidine. Sterile gown and sterile gloves were used for the procedure. Local anesthesia was provided with 1% Lidocaine. Under CT guidance, an 11 gauge On Control bone cutting needle was advanced from a posterior approach into the right iliac bone. Needle positioning was confirmed with CT. Initial non heparinized and heparinized aspirate samples were obtained of bone marrow. Core biopsy was performed via the On Control drill needle. COMPLICATIONS: None FINDINGS: Inspection of initial aspirate did reveal visible  particles. Intact core biopsy sample was obtained. IMPRESSION: CT guided bone marrow biopsy of right posterior iliac bone with both aspirate and core samples obtained. Electronically Signed   By: Aletta Edouard M.D.   On: 05/22/2018 11:07     PERTINENT LAB RESULTS: CBC: Recent Labs    05/21/18 0244 05/22/18 0317  WBC 9.9 8.2  HGB 11.6* 11.0*  HCT 37.0* 34.4*  PLT 99* 108*   CMET CMP     Component Value Date/Time   NA 137 05/23/2018 0403   K 5.2 (H) 05/23/2018 0403   CL 102 05/23/2018 0403   CO2 30 05/23/2018 0403   GLUCOSE 100 (H) 05/23/2018 0403   BUN 18 05/23/2018 0403   CREATININE 0.95 05/23/2018 0403   CREATININE 1.16 05/16/2018 1226   CREATININE 1.06 05/15/2016 1638   CALCIUM 7.8 (L) 05/23/2018 0403   PROT 4.9 (L) 05/21/2018 0244   ALBUMIN 2.1 (L) 05/21/2018 0244   AST 48 (H) 05/21/2018 0244   AST 103 (H) 05/16/2018 1226   ALT 38 05/21/2018 0244   ALT 46 (H) 05/16/2018 1226   ALKPHOS 73 05/21/2018 0244   BILITOT 0.8 05/21/2018 0244   BILITOT 1.0 05/16/2018 1226   GFRNONAA >60 05/23/2018 0403   GFRNONAA 54 (L) 05/16/2018 1226   GFRAA >60 05/23/2018 0403   GFRAA >60 05/16/2018 1226    GFR Estimated Creatinine Clearance: 60.6 mL/min (by C-G formula based on SCr of 0.95 mg/dL). No results for input(s): LIPASE, AMYLASE in the last 72 hours. No results for input(s): CKTOTAL, CKMB, CKMBINDEX, TROPONINI in the last 72 hours. Invalid input(s): POCBNP No results for input(s): DDIMER in the last 72 hours. No results for input(s): HGBA1C in the last 72 hours. No results for input(s): CHOL, HDL, LDLCALC, TRIG, CHOLHDL, LDLDIRECT in the last 72 hours. No results for input(s): TSH, T4TOTAL, T3FREE, THYROIDAB in the last 72 hours.  Invalid input(s): FREET3 No results for input(s): VITAMINB12, FOLATE, FERRITIN, TIBC, IRON, RETICCTPCT in the last 72 hours. Coags: No results for input(s): INR in the last 72 hours.  Invalid input(s): PT Microbiology: Recent Results  (from the past 240 hour(s))  Urine culture     Status: Abnormal   Collection Time: 05/16/18  3:07 PM  Result Value Ref Range Status   Specimen Description   Final    URINE, RANDOM Performed at [facility], [address] [place], [place] [address]    Special Requests   Final    NONE Performed at [facility]  [employer], [address] [place], [place] [address]    Culture (A)  Final    >=100,000 COLONIES/mL ENTEROCOCCUS FAECALIS 20,000 COLONIES/mL KLEBSIELLA PNEUMONIAE    Report Status 05/18/2018 FINAL  Final   Organism ID, Bacteria ENTEROCOCCUS FAECALIS (A)  Final   Organism ID, Bacteria KLEBSIELLA PNEUMONIAE (A)  Final      Susceptibility   Enterococcus faecalis - MIC*    AMPICILLIN <=2 SENSITIVE Sensitive     LEVOFLOXACIN 1 SENSITIVE Sensitive     NITROFURANTOIN <=16 SENSITIVE Sensitive     VANCOMYCIN 1 SENSITIVE Sensitive     * >=100,000 COLONIES/mL ENTEROCOCCUS FAECALIS   Klebsiella pneumoniae - MIC*    AMPICILLIN RESISTANT Resistant     CEFAZOLIN <=4 SENSITIVE Sensitive     CEFTRIAXONE <=1 SENSITIVE Sensitive     CIPROFLOXACIN <=0.25 SENSITIVE Sensitive     GENTAMICIN <=1 SENSITIVE Sensitive     IMIPENEM <=0.25 SENSITIVE Sensitive     NITROFURANTOIN 32 SENSITIVE Sensitive     TRIMETH/SULFA <=20 SENSITIVE Sensitive     AMPICILLIN/SULBACTAM 4 SENSITIVE Sensitive     PIP/TAZO <=4 SENSITIVE Sensitive     Extended ESBL NEGATIVE Sensitive     * 20,000 COLONIES/mL KLEBSIELLA PNEUMONIAE  MRSA PCR Screening     Status: None   Collection Time: 05/17/18  3:15 AM  Result Value Ref Range Status   MRSA by PCR NEGATIVE NEGATIVE Final    Comment:        The GeneXpert MRSA Assay (FDA approved for NASAL specimens only), is one component of a comprehensive MRSA colonization surveillance program. It is not intended to diagnose MRSA infection nor to guide or monitor treatment for MRSA infections. Performed at [facility], [place] [address] [place], [place]  [id number]     FURTHER DISCHARGE INSTRUCTIONS:  Get Medicines reviewed and adjusted: Please take all your medications with you for your next visit with your Primary MD  Laboratory/radiological data: Please request your Primary MD to go over all hospital tests and procedure/radiological results at the follow up, please ask your Primary MD to get all Hospital records sent to his/her office.  In some cases, they will be blood work, cultures and biopsy results pending at the time of your discharge. Please request that your primary care M.D. goes through all the records of your hospital data and follows up on these results.  Also Note the following: If you experience worsening of your admission symptoms, develop shortness of breath, life threatening emergency, suicidal or homicidal thoughts you must seek medical attention immediately by calling 911 or calling your MD immediately  if symptoms less severe.  You must read complete instructions/literature along with all the possible adverse reactions/side effects for all the Medicines you take and that have been prescribed to you. Take any new Medicines after you have completely understood and accpet all the possible adverse reactions/side effects.   Do not drive when taking Pain medications or sleeping medications (Benzodaizepines)  Do not take more than prescribed Pain, Sleep and Anxiety Medications. It is not advisable to combine anxiety,sleep and pain medications without talking with your primary care practitioner  Special Instructions: If you have smoked or chewed Tobacco  in the last 2 yrs please stop smoking, stop any regular Alcohol  and or any Recreational drug use.  Wear Seat belts while driving.  Please note: You were cared for by a hospitalist during your hospital stay. Once you are discharged, your primary care physician will handle any further  medical issues. Please note that NO REFILLS for any discharge medications will be authorized  once you are discharged, as it is imperative that you return to your primary care physician (or establish a relationship with a primary care physician if you do not have one) for your post hospital discharge needs so that they can reassess your need for medications and monitor your lab values.  Total Time spent coordinating discharge including counseling, education and face to face time equals  45 minutes.  SignedOren Binet 05/23/2018 7:54 AM

## 2018-05-25 DIAGNOSIS — N400 Enlarged prostate without lower urinary tract symptoms: Secondary | ICD-10-CM | POA: Diagnosis not present

## 2018-05-25 DIAGNOSIS — N390 Urinary tract infection, site not specified: Secondary | ICD-10-CM | POA: Diagnosis not present

## 2018-05-25 DIAGNOSIS — I509 Heart failure, unspecified: Secondary | ICD-10-CM | POA: Diagnosis not present

## 2018-05-25 DIAGNOSIS — C61 Malignant neoplasm of prostate: Secondary | ICD-10-CM | POA: Diagnosis not present

## 2018-05-26 ENCOUNTER — Other Ambulatory Visit (HOSPITAL_COMMUNITY): Payer: Medicare Other

## 2018-05-26 ENCOUNTER — Telehealth: Payer: Self-pay | Admitting: *Deleted

## 2018-05-26 NOTE — Telephone Encounter (Signed)
Message received from patient's daughter Jackelyn Poling requesting call back to review Dr. Dicie Beam message to her.  Call placed back to patient's daughter and Dr. Antonieta Pert message reread to her.  [name] appreciative of call back and has no further questions at this time.

## 2018-05-26 NOTE — Telephone Encounter (Signed)
Patient's daughter, Jackelyn Poling notified that there is no cancer/lymphoma in the bone marrow.  Pt.'s daughter would like to know what the plan is from here for her father. Dr. Marin Olp notified and states that he would like to see patient in three weeks for lab, MD visit and possibly Xgeva and Lupron.  Call placed back to patient's daughter to inform her of MD orders. [name] transferred to scheduling and appreciative of assistance.

## 2018-05-26 NOTE — Telephone Encounter (Signed)
-----   Message from Volanda Napoleon, MD sent at 05/23/2018  4:36 PM EDT ----- Call his dgtr -- NO cancer in the bone marrow!!  No lymphoma in the bone marrow!!!  Tim Campbell

## 2018-05-28 DIAGNOSIS — C61 Malignant neoplasm of prostate: Secondary | ICD-10-CM | POA: Diagnosis not present

## 2018-05-30 ENCOUNTER — Encounter (HOSPITAL_COMMUNITY): Payer: Self-pay | Admitting: Hematology & Oncology

## 2018-06-01 DIAGNOSIS — I509 Heart failure, unspecified: Secondary | ICD-10-CM | POA: Diagnosis not present

## 2018-06-01 DIAGNOSIS — N390 Urinary tract infection, site not specified: Secondary | ICD-10-CM | POA: Diagnosis not present

## 2018-06-01 DIAGNOSIS — N400 Enlarged prostate without lower urinary tract symptoms: Secondary | ICD-10-CM | POA: Diagnosis not present

## 2018-06-01 DIAGNOSIS — C61 Malignant neoplasm of prostate: Secondary | ICD-10-CM | POA: Diagnosis not present

## 2018-06-06 ENCOUNTER — Encounter: Payer: Self-pay | Admitting: Cardiology

## 2018-06-10 ENCOUNTER — Ambulatory Visit (INDEPENDENT_AMBULATORY_CARE_PROVIDER_SITE_OTHER): Payer: Medicare Other | Admitting: Cardiology

## 2018-06-10 ENCOUNTER — Encounter: Payer: Self-pay | Admitting: Cardiology

## 2018-06-10 VITALS — BP 90/50 | HR 66 | Ht 70.0 in | Wt 205.0 lb

## 2018-06-10 DIAGNOSIS — I5023 Acute on chronic systolic (congestive) heart failure: Secondary | ICD-10-CM

## 2018-06-10 DIAGNOSIS — I25119 Atherosclerotic heart disease of native coronary artery with unspecified angina pectoris: Secondary | ICD-10-CM | POA: Diagnosis not present

## 2018-06-10 DIAGNOSIS — I472 Ventricular tachycardia, unspecified: Secondary | ICD-10-CM

## 2018-06-10 DIAGNOSIS — Z952 Presence of prosthetic heart valve: Secondary | ICD-10-CM

## 2018-06-10 DIAGNOSIS — I2510 Atherosclerotic heart disease of native coronary artery without angina pectoris: Secondary | ICD-10-CM | POA: Diagnosis not present

## 2018-06-10 NOTE — Progress Notes (Signed)
Cardiology Office Note:    Date:  06/10/2018   ID:  Tim Campbell, DOB 04-Dec-1925, MRN [id number]  PCP:  [name], MD  Cardiologist:  [name], MD  Referring MD: [name], MD   Chief Complaint  Patient presents with  . Hospitalization Follow-up    Heart failure    History of Present Illness:    Tim Campbell is a 84 y.o. male with a past medical history significant for CAD s/p 5V CABG 1993  with subsequent stenting to grafts (BMS to prox VG-PDA in 2012, BMS to SVG-OM in 03/2014), severe AS s/p TAVR in 06/2016 with 29 mm Sapien 3 valve, HFrEF with last known EF 40-45%, HTN, RBBB, HLD, hypothyroidism, large cell non-Hodgkins lympoma, BPH s/p thallium laser ablation of his prostate on 12/05/16.  He presented to an outside hospital with elevated troponin in the setting of generalized weakness, leg swelling and hypotension.  Of note he has a recent MRI lumbar spine demonstrating multifocal bone lesions most consistent with metastatic carcinoma.  There was no clear trend for his troponin elevation.  Findings were felt to be consistent with cardiomyopathy and no cardiac cath was felt necessary.  EF was found to be 30-35%.  His prior aortic valve replacement was stable on echo.  He was continued on beta-blocker but changed to carvedilol.  Blood pressure did not allow for addition of ACE inhibitor/ARB/ARN I.  He was diuresed and discharged on Lasix 40 mg daily.  He had some sustained VT and amiodarone was initiated with 400 mg daily for 7 days then decrease to 200 mg daily.  [name] is here today hospital follow-up. He is at rehab at [facility]. He has been doing PT and OT with some progress, but he still not independent. He will likely need continued in home care. He is here in a wheel chair and he nods off to sleep easily. His daughter is with him and states that he sleeps in his wheelchair most of the day. He sleeps in a flat bed at night. He denies chest pain or shortness of  breath. His daughter says that he does not do very much activity at all. He has 2+ lower extremity edema. He usually wears compression stockings but does not have them on today.   He has been seen at [facility] by his PCP. His wt went up and lasix was increased to 80 mg daily. On 8/30 his wt continued to be up and his lasix was increased again to 80 mg in the am and 40 mg in the PM.  He was found to have Stage 4 prostate cancer with mets to his spine and multiple locations. He is on hormonal therapy.   Past Medical History:  Diagnosis Date  . AAA (abdominal aortic aneurysm) ([place])    a. 3.3x3.0cm by CT 05/2016.  Marland Kitchen BPH (benign prostatic hyperplasia)   . Chronic combined systolic and diastolic HF (heart failure), NYHA class 3   . Coronary artery disease CARDIOLOGIST-  DR Angelena Form   a. s/p CABGx5 in 1993. b. NSTEMI with occluded VG-diag in 2005. c. BMS to prox VG-PDA 2012. d. BMS to SVG-OM 03/2014. e. stable cath 05/2016.  . Diverticulosis of colon   . Heart murmur   . History of colon polyps    2002;  2007  . History of non-ST elevation myocardial infarction (NSTEMI)    x2 --- 12-29-2003  and  01-01-2013  . Hypertension   . Hypothyroidism   .  Lower urinary tract symptoms (LUTS)   . Mixed hyperlipidemia   . Non Hodgkin's lymphoma ([place]) dx 2006 bone marrow bx--- onocologist-  dr Marin Olp   s/p  chemo and radiation therpay--- in remission since ---  . OA (osteoarthritis)   . Peripheral neuropathy   . Pulmonary nodules    a. by CT 05/2016  . RBBB (right bundle branch block)   . S/p bare metal coronary artery stent    10-16-2010  BMS to RCA graft  . S/P CABG x 5    05/ 1993  . 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 Urinary retention   . Wears dentures    upper  . Wears glasses     Past Surgical History:  Procedure Laterality Date  . CARDIAC CATHETERIZATION N/A 05/17/2016   Procedure: Right/Left Heart Cath and Coronary/Graft Angiography;  Surgeon: Nelva Bush,  MD;  Location: [facility];  Service: Cardiovascular;  Laterality: N/A;  severe CAD,  total occlusion of mLAD, dLCx, and mRCA, 99% ostial LCX/ wide patent LIMA to LAD & SVG to OM, patent SVG to PDA/PL w/ stable 50% ISR at ostium SVG/ chronic occluded SVG to Diagonal/ severe AVS  . CARDIAC CATHETERIZATION  12-30-2003  dr Leonia Reeves   severe 3v cad:  occlused SVG to diagonal causing NSTEMI,  ef 45%  . CATARACT EXTRACTION W/ INTRAOCULAR LENS  IMPLANT, BILATERAL    . COLONOSCOPY    . CORONARY ANGIOPLASTY WITH STENT PLACEMENT  10-20-2010  dr Daneen Schick   BMS to ostium of the RCA graft:  total occlusion LAD, CFX, and RCA,  bypass graft 's-- total occlusion SVG to diagonal,  high-grade obstruction prox.Alfredo Batty segment of RCA graft,  mid SVG to OM 50% otherwise patent/  mild AVS inferior wall hypokinesis  . CORONARY ARTERY BYPASS GRAFT  05/ 1993     dr gerhart   LIMA to LAD,  SVG to D1,  SVT to RCA,  SVG to OM,  SVG to PDA  . [place]  . I&D EXTREMITY Left 03/09/2016   Procedure: IRRIGATION AND DEBRIDEMENT WRIST;  Surgeon: Milly Jakob, MD;  Location: [facility];  Service: Orthopedics;  Laterality: Left;  . INGUINAL HERNIA REPAIR Right 02/21/2006  . KNEE ARTHROSCOPY Right 1970'S   . LEFT AND RIGHT HEART CATHETERIZATION WITH CORONARY/GRAFT ANGIOGRAM N/A 04/05/2014   Procedure: LEFT AND RIGHT HEART CATHETERIZATION WITH [name];  Surgeon: Burnell Blanks, MD;  Location: [facility] CATH LAB;  Service: Cardiovascular;  Laterality: N/A;  . LEFT HEART CATHETERIZATION WITH CORONARY/GRAFT ANGIOGRAM N/A 01/02/2013   Procedure: LEFT HEART CATHETERIZATION WITH [name];  Surgeon: [name], MD;  Location: [facility] CATH LAB;  Service: Cardiovascular;  Laterality: N/A;NSTEMI:side-to-side anastomosis w/the PDA is threatened(not amenable to PCI)in-stent restenosis  RCA graft,  50% osyial SVG to OM , LIMA to LAD patent w/ diffuse distal LAD disease/  severe 3V CAD w/ occlusion  LAD, CFX, RCA, ef 45%  . OPEN BX LEFT CERVICAL MASS, POSTERIOR NECK  10/03/2004  . OPEN REDUCTION INTERNAL FIXATION (ORIF) DISTAL RADIAL FRACTURE Left 02/24/2016   Procedure: OPEN TREATMENT OF LEFT DISTAL RADIUS FRACTURE;  Surgeon: Milly Jakob, MD;  Location: [place];  Service: Orthopedics;  Laterality: Left;  . ORIF RIGHT ANKLE FX  10/07/2008  . PERCUTANEOUS CORONARY STENT INTERVENTION (PCI-S) N/A 04/21/2014   Procedure: PERCUTANEOUS CORONARY STENT INTERVENTION (PCI-S);  Surgeon: [name], MD;  Location: [facility];  Service: Cardiovascular;  Laterality: N/A;  BMS in the ostial/proximal SVG to CFX,  stable 50-70% ostial SVG to RCA (previous stenting)  . TEE WITHOUT CARDIOVERSION N/A 06/26/2016   Procedure: TRANSESOPHAGEAL ECHOCARDIOGRAM (TEE);  Surgeon: Burnell Blanks, MD;  Location: [place];  Service: Open Heart Surgery;  Laterality: N/A;  . THULIUM LASER TURP (TRANSURETHRAL RESECTION OF PROSTATE) N/A 12/05/2016   Procedure: THULIUM LASER TURP (TRANSURETHRAL RESECTION OF PROSTATE);  Surgeon: Nickie Retort, MD;  Location: [facility];  Service: Urology;  Laterality: N/A;  . TRANSCATHETER AORTIC VALVE REPLACEMENT, TRANSFEMORAL Bilateral 06/26/2016   Procedure: TRANSCATHETER AORTIC VALVE REPLACEMENT, TRANSFEMORAL;  Surgeon: Burnell Blanks, MD;  Location: [place];  Service: Open Heart Surgery;  Laterality: Bilateral;  . TRANSTHORACIC ECHOCARDIOGRAM  08/03/2016    post TAVR    ef 40-45%,  akinesis and scarring of the basal-inferolateral, inferior, and inferoseptal (consistent w/ infarction in distribution of the RCA), severe hypokinesis of the basal anteroseptal (consistent w/ ischemia in distribution of the LAD but w/ patent distal graft),  grade 2 diastolic dysfunction/  AV stent valve bioprosthesis normal function w/ trivial regurg (valve area 1.16cm^2)/  . TRANSTHORACIC ECHOCARDIOGRAM  08-03-2016  continued results- post TAVR   mild dilated ascending aorta/  mild to moderate MR,  peak grandient 75mmHg/  severe LAE and mild RAE/  mild PR/  trivial TR    Current Medications: Current Meds  Medication Sig  . acetaminophen (TYLENOL) 500 MG tablet Take 1 tablet (500 mg total) by mouth every 6 (six) hours as needed for mild pain (knee pain).  Marland Kitchen amiodarone (PACERONE) 200 MG tablet Take 200 mg by mouth daily.  Marland Kitchen aspirin EC 81 MG tablet Take 81 mg by mouth daily.   Marland Kitchen atorvastatin (LIPITOR) 40 MG tablet TAKE 1 TABLET (40 MG TOTAL) BY MOUTH DAILY AT 6 PM. PLEASE SCHEDULE ANNUAL APPOINTMENT THANKS. (Patient taking differently: Take 40 mg by mouth daily at 6 PM. )  . B Complex-C (B-COMPLEX WITH VITAMIN C) tablet Take 1 tablet by mouth 3 (three) times a week.   . bicalutamide (CASODEX) 50 MG tablet Take 1 tablet (50 mg total) by mouth daily.  . carvedilol (COREG) 3.125 MG tablet Take 1 tablet (3.125 mg total) by mouth 2 (two) times daily with a meal.  . dronabinol (MARINOL) 2.5 MG capsule Take 1 capsule (2.5 mg total) by mouth 3 (three) times daily before meals.  . folic acid (FOLVITE) 1 MG tablet Take 1 tablet (1 mg total) by mouth 3 (three) times a week.  . furosemide (LASIX) 40 MG tablet Take 40 mg by mouth 3 (three) times daily.  Marland Kitchen GARLIC PO Take 1 tablet by mouth daily.   Marland Kitchen levothyroxine (SYNTHROID, LEVOTHROID) 125 MCG tablet Take 125 mcg by mouth daily before breakfast.   . Magnesium 200 MG TABS Take 225 mg by mouth every other day.   . Multiple Vitamins-Iron (CHLORELLA PO) Take 6 tablets by mouth 2 (two) times daily. MED NAME: CHLORELLA   . nitroGLYCERIN (NITROSTAT) 0.4 MG SL tablet Place 1 tablet (0.4 mg total) under the tongue every 5 (five) minutes as needed for chest pain.  Marland Kitchen oxyCODONE (OXY IR/ROXICODONE) 5 MG immediate release tablet Take 1 tablet (5 mg total) by mouth every 6 (six) hours as needed for moderate pain.  . polyethylene glycol (MIRALAX / GLYCOLAX) packet Take 17 g by mouth 2 (two) times daily.  . tamsulosin (FLOMAX) 0.4 MG CAPS capsule Take 0.4 mg by mouth 2  (two) times daily.   Marland Kitchen tiZANidine (ZANAFLEX) 2  MG tablet Take 2 mg by mouth every 6 (six) hours as needed (rest).   . traZODone (DESYREL) 50 MG tablet Take 50 mg by mouth at bedtime.  . vitamin E 100 UNIT capsule Take 100 Units by mouth 3 (three) times a week.      Allergies:   Patient has no known allergies.   Social History   Socioeconomic History  . Marital status: Married    Spouse name: Not on file  . Number of children: 3  . Years of education: Not on file  . Highest education level: Not on file  Occupational History  . 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 Financial resource strain: Not on file  . Food insecurity:    Worry: Not on file    Inability: Not on file  . Transportation needs:    Medical: Not on file    Non-medical: Not on file  Tobacco Use  . Smoking status: Former Smoker    Packs/day: 0.50    Years: 15.00    Pack years: 7.50    Types: Cigarettes    Last attempt to quit: 06/22/1961    Years since quitting: 57.0  . Smokeless tobacco: Never Used  Substance and Sexual Activity  . Alcohol use: No  . Drug use: No  . Sexual activity: Not on file  Lifestyle  . Physical activity:    Days per week: Not on file    Minutes per session: Not on file  . Stress: Not on file  Relationships  . Social connections:    Talks on phone: Not on file    Gets together: Not on file    Attends religious service: Not on file    Active member of club or organization: Not on file    Attends meetings of clubs or organizations: Not on file    Relationship status: Not on file  Other Topics Concern  . Not on file  Social History Narrative  . Not on file     Family History: The patient's family history includes Bone cancer in his brother; CAD in his brother; Colon cancer in his father; Dementia in his sister; Parkinson's disease in his brother; Stroke in his mother, sister, and sister. ROS:   Please see the history of present  illness.     All other systems reviewed and are negative.  EKGs/Labs/Other Studies Reviewed:    The following studies were reviewed today:  Echocardiogram 05/17/2018  study Conclusions - Left ventricle: The cavity size was mildly dilated. Wall   thickness was increased in a pattern of mild LVH. Systolic   function was moderately to severely reduced. The estimated   ejection fraction was in the range of 30% to 35%. Diffuse   hypokinesis. Doppler parameters are consistent with restrictive   physiology, indicative of decreased left ventricular diastolic   compliance and/or increased left atrial pressure. Doppler   parameters are consistent with high ventricular filling pressure. - Aortic valve: There is a 29 mm Edwards Sapien 3 tissue valve in   the AV position. Mild stable gradient across the AVR. Mean   gradient (S): 23 mm Hg. Valve area (VTI): 1.26 cm^2. Valve area   (Vmax): 1.56 cm^2. Valve area (Vmean): 1.11 cm^2. - Aorta: The visualized portion of the proximal ascending aorta is   mildly dilated at 4 cm. - Mitral valve: Mildly to moderately calcified annulus. Normal   thickness leaflets . Valve area by continuity equation (  using   LVOT flow): 4.36 cm^2. - Left atrium: The atrium was severely dilated. - Right ventricle: The cavity size was mildly dilated. - Right atrium: The atrium was moderately dilated. - Atrial septum: No defect or patent foramen ovale was identified. - Tricuspid valve: There was moderate regurgitation. - Pulmonary arteries: Systolic pressure was moderately increased.   PA peak pressure: 54 mm Hg (S).  Echo 07/10/2017: EF 40-45% Echo 08/03/2016: EF 40-45% Echo 06/07/2016: EF 45-50%  EKG:  EKG is not ordered today.    Recent Labs: 05/16/2018: B Natriuretic Peptide 855.2 05/17/2018: TSH 2.020 05/21/2018: ALT 38 05/22/2018: Hemoglobin 11.0; Magnesium 1.8; Platelets 108 05/23/2018: BUN 18; Creatinine, Ser 0.95; Potassium 5.2; Sodium 137   Recent Lipid  Panel No results found for: CHOL, TRIG, HDL, CHOLHDL, VLDL, LDLCALC, LDLDIRECT  Physical Exam:    VS:  BP (!) 90/50   Pulse 66   Ht 5\' 10"  (1.778 m)   Wt 205 lb (93 kg)   BMI 29.41 kg/m     Wt Readings from Last 3 Encounters:  06/10/18 205 lb (93 kg)  05/23/18 206 lb 12.7 oz (93.8 kg)  05/07/18 209 lb (94.8 kg)     Physical Exam  Constitutional: He is oriented to person, place, and time. No distress.  Elderly, chronically ill appearing male in a wheel chair  HENT:  Head: Normocephalic and atraumatic.  Neck: Normal range of motion. Neck supple. No JVD present.  Cardiovascular: Normal rate, regular rhythm and intact distal pulses. Exam reveals no gallop and no friction rub.  No murmur heard. Pulmonary/Chest: Effort normal and breath sounds normal. No respiratory distress. He has no wheezes. He has no rales.  Abdominal: Soft. Bowel sounds are normal.  Musculoskeletal: He exhibits edema.  2+ lower leg edema  Neurological: He is alert and oriented to person, place, and time.  Skin: Skin is warm and dry.  Psychiatric: He has a normal mood and affect. His behavior is normal.  Vitals reviewed.    ASSESSMENT:    1. Acute on chronic systolic CHF (congestive heart failure) ([place])   2. Coronary artery disease involving native coronary artery of native heart without angina pectoris   3. S/P TAVR (transcatheter aortic valve replacement)   4. VT (ventricular tachycardia) ([facility])    PLAN:    In order of problems listed above:   Acute on chronic systolic heart failure: EF 30-35% by echo 05/17/18. Pt denies dyspnea but he is not very active. He has 2+ lower leg edema. Lasix dosing has been escalated by his PCP, now 80 mg in the am and 40 mg in the pm as of 3 days ago. So far wt and edema are not much changed. He is on low dose carvedilol and unable to add ACE-I/ARB/ARNI due to blood pressure.  Will check BMet and BMP today. May need to go up on lasix again if renal function stable and BNP  elevated.  Advised on low sodium diet <2gm, elevate legs more. He often sits in wheel chair all day. I discussed with his daughter when he would need hospitalization for volume overload- dyspnea at rest, unable to lay down to sleep at night due to dyspnea. With his spreading cancer and heart failure for which he is unable to tolerate medical therapy, he may need to consider paliative care or hospice.   CAD: continues on aspirin and statin. No chest pain.   Prior aortic valve replacement: stable on recent echo.   Sustained VT in the hospital:  on amiodarone. No evidence of recurrence.   Medication Adjustments/Labs and Tests Ordered: Current medicines are reviewed at length with the patient today.  Concerns regarding medicines are outlined above. Labs and tests ordered and medication changes are outlined in the patient instructions below:  Patient Instructions  Medication Instructions: Your physician recommends that you continue on your current medications as directed. Please refer to the Current Medication list given to you today.   Labwork: TODAY: BMET, BNP  Procedures/Testing: None Ordered  Follow-Up: Your physician recommends that you schedule a follow-up appointment in: First available with Dr.Smith  APP in 1 month    Any Additional Special Instructions Will Be Listed Below (If Applicable).     If you need a refill on your cardiac medications before your next appointment, please call your pharmacy.      Signed, Daune Perch, NP  06/10/2018 4:45 PM    [place]

## 2018-06-10 NOTE — Patient Instructions (Signed)
Medication Instructions: Your physician recommends that you continue on your current medications as directed. Please refer to the Current Medication list given to you today.   Labwork: TODAY: BMET, BNP  Procedures/Testing: None Ordered  Follow-Up: Your physician recommends that you schedule a follow-up appointment in: First available with Dr.Smith  APP in 1 month    Any Additional Special Instructions Will Be Listed Below (If Applicable).     If you need a refill on your cardiac medications before your next appointment, please call your pharmacy.

## 2018-06-11 ENCOUNTER — Other Ambulatory Visit: Payer: Self-pay | Admitting: Hematology & Oncology

## 2018-06-11 ENCOUNTER — Telehealth: Payer: Self-pay

## 2018-06-11 ENCOUNTER — Encounter: Payer: Self-pay | Admitting: Hematology & Oncology

## 2018-06-11 DIAGNOSIS — C61 Malignant neoplasm of prostate: Secondary | ICD-10-CM | POA: Insufficient documentation

## 2018-06-11 DIAGNOSIS — E8770 Fluid overload, unspecified: Secondary | ICD-10-CM

## 2018-06-11 DIAGNOSIS — C7951 Secondary malignant neoplasm of bone: Principal | ICD-10-CM

## 2018-06-11 HISTORY — DX: Secondary malignant neoplasm of bone: C61

## 2018-06-11 LAB — BASIC METABOLIC PANEL
BUN/Creatinine Ratio: 24 (ref 10–24)
BUN: 28 mg/dL — AB (ref 8–27)
CALCIUM: 7.2 mg/dL — AB (ref 8.6–10.2)
CHLORIDE: 97 mmol/L (ref 96–106)
CO2: 25 mmol/L (ref 20–29)
Creatinine, Ser: 1.18 mg/dL (ref 0.76–1.27)
GFR, EST AFRICAN AMERICAN: 63 mL/min/{1.73_m2} (ref >59)
GFR, EST NON AFRICAN AMERICAN: 54 mL/min/{1.73_m2} — AB (ref >59)
Glucose: 112 mg/dL — ABNORMAL HIGH (ref 65–99)
Potassium: 3.9 mmol/L (ref 3.5–5.2)
Sodium: 139 mmol/L (ref 134–144)

## 2018-06-11 LAB — PRO B NATRIURETIC PEPTIDE: NT-Pro BNP: 17616 pg/mL — ABNORMAL HIGH (ref 0–486)

## 2018-06-11 MED ORDER — POTASSIUM CHLORIDE ER 10 MEQ PO TBCR
10.0000 meq | EXTENDED_RELEASE_TABLET | Freq: Every day | ORAL | 3 refills | Status: AC
Start: 2018-06-11 — End: ?

## 2018-06-11 MED ORDER — TORSEMIDE 20 MG PO TABS: ORAL_TABLET | ORAL | 3 refills | Status: AC

## 2018-06-11 NOTE — Telephone Encounter (Signed)
-----   Message from Daune Perch, NP sent at 06/11/2018  8:12 AM EDT ----- Pt is staying at [facility] and he has a daughter who cares for him. Labs with elevated BNP showed significant volume overload. The pt needs diuresis. Please have pt switch lasix to torsemide 40 mg BID for 4 days then 40 mg daily. Give one dose of metolazone 2.5 mg to jump start the diuresis. Start Kdur 10 mEq daily.  Stop carvedilol for now to allow for more blood pressure while diuresing. He will need follow up BMet in 1 week as well as follow up in the office in 1-2 weeks. Please inform the daughter that if his fluid status does not improve within 3-5 days on this new regimen, he will likely need to go into the hospital for more aggressive diuresis. (Plan discussed with DOD, Dr. Burt Knack)  Daune Perch, NP

## 2018-06-11 NOTE — Telephone Encounter (Signed)
Erroneous encounter

## 2018-06-11 NOTE — Telephone Encounter (Signed)
Notes recorded by Frederik Schmidt, RN on 06/11/2018 at 9:39 AM EDT Reviewed lab recommendations with patient's daughter Jackelyn Poling). She confirmed recommendations.

## 2018-06-11 NOTE — Telephone Encounter (Deleted)
-----   Message from Daune Perch, NP sent at 06/11/2018  8:12 AM EDT ----- Pt is staying at [facility] and he has a daughter who cares for him. Labs with elevated BNP showed significant volume overload. The pt needs diuresis. Please have pt switch lasix to torsemide 40 mg BID for 4 days then 40 mg daily. Give one dose of metolazone 2.5 mg to jump start the diuresis. Start Kdur 10 mEq daily.  Stop carvedilol for now to allow for more blood pressure while diuresing. He will need follow up BMet in 1 week as well as follow up in the office in 1-2 weeks. Please inform the daughter that if his fluid status does not improve within 3-5 days on this new regimen, he will likely need to go into the hospital for more aggressive diuresis. (Plan discussed with DOD, Dr. Burt Knack)  Daune Perch, NP

## 2018-06-11 NOTE — Telephone Encounter (Deleted)
-----   Message from Daune Perch, NP sent at 06/11/2018  8:12 AM EDT ----- Pt is staying at [facility] and he has a daughter who cares for him. Labs with elevated BNP showed significant volume overload. The pt needs diuresis. Please have pt switch lasix to torsemide 40 mg BID for 4 days then 40 mg daily. Give one dose of metolazone 2.5 mg to jump start the diuresis. Start Kdur 10 mEq daily.  Stop carvedilol for now to allow for more blood pressure while diuresing. He will need follow up [facility] in 1 week as well as follow up in the office in 1-2 weeks. Please inform the daughter that if his fluid status does not improve within 3-5 days on this new regimen, he will likely need to go into the hospital for more aggressive diuresis. (Plan discussed with DOD, Dr. Burt Knack)  Daune Perch, NP

## 2018-06-11 NOTE — Telephone Encounter (Signed)
Spoke to patient's daughter Jackelyn Poling) and informed her of Nina's recommendations to diurese patient.  We reviewed medication changes, arranged lab appt (9/12) and scheduled OV with Gae Bon (9/20).  She verbalized understanding.  Also, called [facility] and gave them instruction to cancel Carvedilol administration for 9/5.

## 2018-06-12 ENCOUNTER — Telehealth: Payer: Self-pay | Admitting: Cardiology

## 2018-06-12 NOTE — Telephone Encounter (Signed)
Spoke to [facility] nursing [facility] and informed them that I had faxed orders for patient per Pecolia Ades.

## 2018-06-12 NOTE — Telephone Encounter (Signed)
New Message:   Pt will be stating at [facility]. They will need an order for the new medicine that Janine started him on. They will also needs order for other instructions on what pt should be doing please.

## 2018-06-14 DIAGNOSIS — I5023 Acute on chronic systolic (congestive) heart failure: Secondary | ICD-10-CM | POA: Diagnosis not present

## 2018-06-14 DIAGNOSIS — I110 Hypertensive heart disease with heart failure: Secondary | ICD-10-CM | POA: Diagnosis not present

## 2018-06-15 DIAGNOSIS — I110 Hypertensive heart disease with heart failure: Secondary | ICD-10-CM | POA: Diagnosis not present

## 2018-06-15 DIAGNOSIS — I5023 Acute on chronic systolic (congestive) heart failure: Secondary | ICD-10-CM | POA: Diagnosis not present

## 2018-06-16 ENCOUNTER — Encounter (HOSPITAL_COMMUNITY): Payer: Self-pay | Admitting: Emergency Medicine

## 2018-06-16 ENCOUNTER — Emergency Department (HOSPITAL_COMMUNITY): Payer: Medicare Other

## 2018-06-16 ENCOUNTER — Inpatient Hospital Stay (HOSPITAL_COMMUNITY)
Admission: EM | Admit: 2018-06-16 | Discharge: 2018-07-08 | DRG: 871 | Disposition: E | Payer: Medicare Other | Attending: Internal Medicine | Admitting: Internal Medicine

## 2018-06-16 ENCOUNTER — Telehealth: Payer: Self-pay | Admitting: Interventional Cardiology

## 2018-06-16 ENCOUNTER — Other Ambulatory Visit: Payer: Self-pay

## 2018-06-16 DIAGNOSIS — I469 Cardiac arrest, cause unspecified: Secondary | ICD-10-CM

## 2018-06-16 DIAGNOSIS — R7989 Other specified abnormal findings of blood chemistry: Secondary | ICD-10-CM

## 2018-06-16 DIAGNOSIS — I472 Ventricular tachycardia, unspecified: Secondary | ICD-10-CM

## 2018-06-16 DIAGNOSIS — I252 Old myocardial infarction: Secondary | ICD-10-CM

## 2018-06-16 DIAGNOSIS — Z9221 Personal history of antineoplastic chemotherapy: Secondary | ICD-10-CM

## 2018-06-16 DIAGNOSIS — Z8546 Personal history of malignant neoplasm of prostate: Secondary | ICD-10-CM

## 2018-06-16 DIAGNOSIS — I462 Cardiac arrest due to underlying cardiac condition: Secondary | ICD-10-CM | POA: Diagnosis not present

## 2018-06-16 DIAGNOSIS — Z8572 Personal history of non-Hodgkin lymphomas: Secondary | ICD-10-CM

## 2018-06-16 DIAGNOSIS — R011 Cardiac murmur, unspecified: Secondary | ICD-10-CM | POA: Diagnosis present

## 2018-06-16 DIAGNOSIS — Z9842 Cataract extraction status, left eye: Secondary | ICD-10-CM

## 2018-06-16 DIAGNOSIS — D649 Anemia, unspecified: Secondary | ICD-10-CM

## 2018-06-16 DIAGNOSIS — I5043 Acute on chronic combined systolic (congestive) and diastolic (congestive) heart failure: Secondary | ICD-10-CM

## 2018-06-16 DIAGNOSIS — Z9841 Cataract extraction status, right eye: Secondary | ICD-10-CM

## 2018-06-16 DIAGNOSIS — R0602 Shortness of breath: Secondary | ICD-10-CM | POA: Diagnosis not present

## 2018-06-16 DIAGNOSIS — N183 Chronic kidney disease, stage 3 unspecified: Secondary | ICD-10-CM | POA: Diagnosis present

## 2018-06-16 DIAGNOSIS — R079 Chest pain, unspecified: Secondary | ICD-10-CM | POA: Diagnosis not present

## 2018-06-16 DIAGNOSIS — Z79890 Hormone replacement therapy (postmenopausal): Secondary | ICD-10-CM

## 2018-06-16 DIAGNOSIS — I110 Hypertensive heart disease with heart failure: Secondary | ICD-10-CM | POA: Diagnosis not present

## 2018-06-16 DIAGNOSIS — Z951 Presence of aortocoronary bypass graft: Secondary | ICD-10-CM

## 2018-06-16 DIAGNOSIS — I5021 Acute systolic (congestive) heart failure: Secondary | ICD-10-CM | POA: Diagnosis present

## 2018-06-16 DIAGNOSIS — D696 Thrombocytopenia, unspecified: Secondary | ICD-10-CM | POA: Diagnosis present

## 2018-06-16 DIAGNOSIS — N400 Enlarged prostate without lower urinary tract symptoms: Secondary | ICD-10-CM | POA: Diagnosis present

## 2018-06-16 DIAGNOSIS — R0902 Hypoxemia: Secondary | ICD-10-CM | POA: Diagnosis present

## 2018-06-16 DIAGNOSIS — R778 Other specified abnormalities of plasma proteins: Secondary | ICD-10-CM

## 2018-06-16 DIAGNOSIS — R918 Other nonspecific abnormal finding of lung field: Secondary | ICD-10-CM | POA: Diagnosis present

## 2018-06-16 DIAGNOSIS — Z515 Encounter for palliative care: Secondary | ICD-10-CM | POA: Diagnosis not present

## 2018-06-16 DIAGNOSIS — Z8249 Family history of ischemic heart disease and other diseases of the circulatory system: Secondary | ICD-10-CM

## 2018-06-16 DIAGNOSIS — I714 Abdominal aortic aneurysm, without rupture: Secondary | ICD-10-CM | POA: Diagnosis present

## 2018-06-16 DIAGNOSIS — I2510 Atherosclerotic heart disease of native coronary artery without angina pectoris: Secondary | ICD-10-CM | POA: Diagnosis present

## 2018-06-16 DIAGNOSIS — A419 Sepsis, unspecified organism: Secondary | ICD-10-CM | POA: Diagnosis not present

## 2018-06-16 DIAGNOSIS — C7951 Secondary malignant neoplasm of bone: Secondary | ICD-10-CM | POA: Diagnosis present

## 2018-06-16 DIAGNOSIS — I130 Hypertensive heart and chronic kidney disease with heart failure and stage 1 through stage 4 chronic kidney disease, or unspecified chronic kidney disease: Secondary | ICD-10-CM | POA: Diagnosis present

## 2018-06-16 DIAGNOSIS — G629 Polyneuropathy, unspecified: Secondary | ICD-10-CM | POA: Diagnosis present

## 2018-06-16 DIAGNOSIS — R072 Precordial pain: Secondary | ICD-10-CM

## 2018-06-16 DIAGNOSIS — I959 Hypotension, unspecified: Secondary | ICD-10-CM | POA: Diagnosis not present

## 2018-06-16 DIAGNOSIS — Z800 Family history of malignant neoplasm of digestive organs: Secondary | ICD-10-CM

## 2018-06-16 DIAGNOSIS — Z87891 Personal history of nicotine dependence: Secondary | ICD-10-CM

## 2018-06-16 DIAGNOSIS — I5023 Acute on chronic systolic (congestive) heart failure: Secondary | ICD-10-CM | POA: Diagnosis not present

## 2018-06-16 DIAGNOSIS — L89152 Pressure ulcer of sacral region, stage 2: Secondary | ICD-10-CM | POA: Diagnosis present

## 2018-06-16 DIAGNOSIS — Z823 Family history of stroke: Secondary | ICD-10-CM

## 2018-06-16 DIAGNOSIS — N179 Acute kidney failure, unspecified: Secondary | ICD-10-CM | POA: Diagnosis present

## 2018-06-16 DIAGNOSIS — Z973 Presence of spectacles and contact lenses: Secondary | ICD-10-CM

## 2018-06-16 DIAGNOSIS — Z87440 Personal history of urinary (tract) infections: Secondary | ICD-10-CM

## 2018-06-16 DIAGNOSIS — E039 Hypothyroidism, unspecified: Secondary | ICD-10-CM | POA: Diagnosis present

## 2018-06-16 DIAGNOSIS — Z961 Presence of intraocular lens: Secondary | ICD-10-CM | POA: Diagnosis present

## 2018-06-16 DIAGNOSIS — Z952 Presence of prosthetic heart valve: Secondary | ICD-10-CM

## 2018-06-16 DIAGNOSIS — R4182 Altered mental status, unspecified: Secondary | ICD-10-CM | POA: Diagnosis present

## 2018-06-16 DIAGNOSIS — D638 Anemia in other chronic diseases classified elsewhere: Secondary | ICD-10-CM | POA: Diagnosis present

## 2018-06-16 DIAGNOSIS — Z66 Do not resuscitate: Secondary | ICD-10-CM | POA: Diagnosis present

## 2018-06-16 DIAGNOSIS — Z7982 Long term (current) use of aspirin: Secondary | ICD-10-CM

## 2018-06-16 DIAGNOSIS — R579 Shock, unspecified: Secondary | ICD-10-CM | POA: Diagnosis present

## 2018-06-16 DIAGNOSIS — I214 Non-ST elevation (NSTEMI) myocardial infarction: Secondary | ICD-10-CM | POA: Diagnosis present

## 2018-06-16 DIAGNOSIS — R0789 Other chest pain: Secondary | ICD-10-CM | POA: Diagnosis not present

## 2018-06-16 DIAGNOSIS — Z923 Personal history of irradiation: Secondary | ICD-10-CM

## 2018-06-16 DIAGNOSIS — C61 Malignant neoplasm of prostate: Secondary | ICD-10-CM | POA: Diagnosis present

## 2018-06-16 DIAGNOSIS — Z972 Presence of dental prosthetic device (complete) (partial): Secondary | ICD-10-CM

## 2018-06-16 DIAGNOSIS — E782 Mixed hyperlipidemia: Secondary | ICD-10-CM | POA: Diagnosis present

## 2018-06-16 DIAGNOSIS — R339 Retention of urine, unspecified: Secondary | ICD-10-CM | POA: Diagnosis present

## 2018-06-16 DIAGNOSIS — Z955 Presence of coronary angioplasty implant and graft: Secondary | ICD-10-CM

## 2018-06-16 DIAGNOSIS — Z79899 Other long term (current) drug therapy: Secondary | ICD-10-CM

## 2018-06-16 DIAGNOSIS — Z86010 Personal history of colonic polyps: Secondary | ICD-10-CM

## 2018-06-16 LAB — I-STAT TROPONIN, ED: Troponin i, poc: 0.52 ng/mL (ref 0.00–0.08)

## 2018-06-16 LAB — BASIC METABOLIC PANEL
Anion gap: 15 (ref 5–15)
BUN: 50 mg/dL — AB (ref 8–23)
CO2: 29 mmol/L (ref 22–32)
Calcium: 6.5 mg/dL — ABNORMAL LOW (ref 8.9–10.3)
Chloride: 91 mmol/L — ABNORMAL LOW (ref 98–111)
Creatinine, Ser: 2.09 mg/dL — ABNORMAL HIGH (ref 0.61–1.24)
GFR, EST AFRICAN AMERICAN: 31 mL/min — AB (ref >60)
GFR, EST NON AFRICAN AMERICAN: 26 mL/min — AB (ref >60)
Glucose, Bld: 132 mg/dL — ABNORMAL HIGH (ref 70–99)
POTASSIUM: 3.6 mmol/L (ref 3.5–5.1)
SODIUM: 135 mmol/L (ref 135–145)

## 2018-06-16 LAB — CBC
HEMATOCRIT: 29.9 % — AB (ref 39.0–52.0)
HEMOGLOBIN: 9.2 g/dL — AB (ref 13.0–17.0)
MCH: 25.2 pg — ABNORMAL LOW (ref 26.0–34.0)
MCHC: 30.8 g/dL (ref 30.0–36.0)
MCV: 81.9 fL (ref 78.0–100.0)
Platelets: 115 10*3/uL — ABNORMAL LOW (ref 150–400)
RBC: 3.65 MIL/uL — AB (ref 4.22–5.81)
RDW: 16.6 % — AB (ref 11.5–15.5)
WBC: 14 10*3/uL — AB (ref 4.0–10.5)

## 2018-06-16 MED ORDER — DOBUTAMINE IN D5W 4-5 MG/ML-% IV SOLN
2.5000 ug/kg/min | INTRAVENOUS | Status: DC
  Administered 2018-06-16: 2.5 ug/kg/min via INTRAVENOUS
  Filled 2018-06-16: qty 250

## 2018-06-16 MED ORDER — ASPIRIN 81 MG PO CHEW
324.0000 mg | CHEWABLE_TABLET | Freq: Once | ORAL | Status: AC
  Administered 2018-06-16: 324 mg via ORAL
  Filled 2018-06-16: qty 4

## 2018-06-16 NOTE — ED Triage Notes (Signed)
Per EMS, pt coming from home with complaints of chest pain and SOB. Pt was doing his PT exercises in his wheelchair when he experienced a sharp pain in his chest around 2130. Pt was found to by hypoxic on scene with O2 at 82% room air. Pt placed ion 4L [place] via EMS. Pt BP was 90' systolic and given NS bolus. Pt has foley placed for stage 4 prostate cancer. A&O x4.

## 2018-06-16 NOTE — ED Notes (Signed)
ED Provider at bedside. 

## 2018-06-16 NOTE — Telephone Encounter (Signed)
New Message:    Patient daughter is requesting a call back after 2:30 regarding her father.

## 2018-06-16 NOTE — ED Provider Notes (Addendum)
[facility] EMERGENCY DEPARTMENT Provider Note   CSN: [id number] Arrival date & time: 06/19/2018  2236     History   Chief Complaint Chief Complaint  Patient presents with  . Chest Pain  . Shortness of Breath    HPI Tim Campbell is a 84 y.o. male.  The history is provided by the patient, a relative and the spouse. The history is limited by the condition of the patient (Confusion).  He has history of combined systolic and diastolic heart failure, coronary artery disease status post bypass and stent placement, hypertension, hyperlipidemia, lymphoma, prostate cancer and comes to the emergency department because of shortness of breath and chest discomfort which started this evening.  He had been hospitalized recently with exacerbation of heart failure and had gone to a rehab facility for 3 weeks and was discharged within the last week.  Family states that he has generally been doing poorly.  He has considerable peripheral edema.  Tonight, he was doing some simple exercises in bed and he developed worsening dyspnea.  Dyspnea persisted even though he stopped doing the exercises.  He also complained of some chest tightness.  There is no nausea or diaphoresis.  Additional history, family states that he has a sacral decubitus ulcer.  Family does relate that he does not wish to be on any kind of life support and would not want to be intubated, but would want CPR and defibrillation if necessary.  Past Medical History:  Diagnosis Date  . AAA (abdominal aortic aneurysm) ([place])    a. 3.3x3.0cm by CT 05/2016.  Marland Kitchen BPH (benign prostatic hyperplasia)   . Chronic combined systolic and diastolic HF (heart failure), NYHA class 3   . Coronary artery disease CARDIOLOGIST-  DR Angelena Form   a. s/p CABGx5 in 1993. b. NSTEMI with occluded VG-diag in 2005. c. BMS to prox VG-PDA 2012. d. BMS to SVG-OM 03/2014. e. stable cath 05/2016.  . Diverticulosis of colon   . Heart murmur   . History of colon  polyps    2002;  2007  . History of non-ST elevation myocardial infarction (NSTEMI)    x2 --- 12-29-2003  and  01-01-2013  . Hypertension   . Hypothyroidism   . Lower urinary tract symptoms (LUTS)   . Mixed hyperlipidemia   . Non Hodgkin's lymphoma ([place]) dx 2006 bone marrow bx--- onocologist-  dr Marin Olp   s/p  chemo and radiation therpay--- in remission since ---  . OA (osteoarthritis)   . Peripheral neuropathy   . Prostate cancer metastatic to bone ([place]) 06/11/2018  . Pulmonary nodules    a. by CT 05/2016  . RBBB (right bundle branch block)   . S/p bare metal coronary artery stent    10-16-2010  BMS to RCA graft  . S/P CABG x 5    05/ 1993  . 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 Urinary retention   . Wears dentures    upper  . Wears glasses     Patient Active Problem List   Diagnosis Date Noted  . Prostate cancer metastatic to bone ([place]) 06/11/2018  . VT (ventricular tachycardia) ([place]) 05/20/2018  . Metastatic cancer ([place])   . Bilateral lower extremity edema   . Non-ST elevated myocardial infarction ([place]) 05/16/2018  . Acute on chronic systolic CHF (congestive heart failure) ([place]) 05/16/2018  . Acute lower UTI 05/16/2018  . Acute urinary retention 12/17/2016  . Hematuria, gross- s/p TURP ~ 12 days prior to admission.  12/17/2016  . Renal failure, acute on chronic (HCC) 12/17/2016  . Gross hematuria   . Urinary retention   . Acute renal failure superimposed on stage 3 chronic kidney disease ([place])   . Pulmonary nodules 07/07/2016  . S/P TAVR (transcatheter aortic valve replacement) 06/28/2016  . Thrombocytopenia ([place]) 06/28/2016  . Hypothyroidism 06/28/2016  . RBBB 06/28/2016  . AAA (abdominal aortic aneurysm) ([place])   . Severe aortic valve stenosis 06/26/2016  . Postoperative wound infection 03/11/2016  . Preoperative cardiovascular examination 02/20/2016  . Hyperlipidemia 06/17/2014  . Acute on chronic combined systolic and diastolic heart  failure ([place]) 09/09/2013  . Coronary artery disease involving native coronary artery of native heart with angina pectoris ([place]) 01/03/2013  . HTN (hypertension) 01/03/2013  . Non-ST elevation MI (NSTEMI) ([place]) 01/02/2013    Class: Acute    Past Surgical History:  Procedure Laterality Date  . CARDIAC CATHETERIZATION N/A 05/17/2016   Procedure: Right/Left Heart Cath and Coronary/Graft Angiography;  Surgeon: Nelva Bush, MD;  Location: [facility];  Service: Cardiovascular;  Laterality: N/A;  severe CAD,  total occlusion of mLAD, dLCx, and mRCA, 99% ostial LCX/ wide patent LIMA to LAD & SVG to OM, patent SVG to PDA/PL w/ stable 50% ISR at ostium SVG/ chronic occluded SVG to Diagonal/ severe AVS  . CARDIAC CATHETERIZATION  12-30-2003  dr Leonia Reeves   severe 3v cad:  occlused SVG to diagonal causing NSTEMI,  ef 45%  . CATARACT EXTRACTION W/ INTRAOCULAR LENS  IMPLANT, BILATERAL    . COLONOSCOPY    . CORONARY ANGIOPLASTY WITH STENT PLACEMENT  10-20-2010  dr Daneen Schick   BMS to ostium of the RCA graft:  total occlusion LAD, CFX, and RCA,  bypass graft 's-- total occlusion SVG to diagonal,  high-grade obstruction prox.Alfredo Batty segment of RCA graft,  mid SVG to OM 50% otherwise patent/  mild AVS inferior wall hypokinesis  . CORONARY ARTERY BYPASS GRAFT  05/ 1993     dr gerhart   LIMA to LAD,  SVG to D1,  SVT to RCA,  SVG to OM,  SVG to PDA  . [place]  . I&D EXTREMITY Left 03/09/2016   Procedure: IRRIGATION AND DEBRIDEMENT WRIST;  Surgeon: Milly Jakob, MD;  Location: [facility];  Service: Orthopedics;  Laterality: Left;  . INGUINAL HERNIA REPAIR Right 02/21/2006  . KNEE ARTHROSCOPY Right 1970'S   . LEFT AND RIGHT HEART CATHETERIZATION WITH CORONARY/GRAFT ANGIOGRAM N/A 04/05/2014   Procedure: LEFT AND RIGHT HEART CATHETERIZATION WITH [name];  Surgeon: Burnell Blanks, MD;  Location: [facility] CATH LAB;  Service: Cardiovascular;  Laterality: N/A;  . LEFT HEART  CATHETERIZATION WITH CORONARY/GRAFT ANGIOGRAM N/A 01/02/2013   Procedure: LEFT HEART CATHETERIZATION WITH [name];  Surgeon: [name], MD;  Location: [facility] CATH LAB;  Service: Cardiovascular;  Laterality: N/A;NSTEMI:side-to-side anastomosis w/the PDA is threatened(not amenable to PCI)in-stent restenosis  RCA graft,  50% osyial SVG to OM , LIMA to LAD patent w/ diffuse distal LAD disease/  severe 3V CAD w/ occlusion LAD, CFX, RCA, ef 45%  . OPEN BX LEFT CERVICAL MASS, POSTERIOR NECK  10/03/2004  . OPEN REDUCTION INTERNAL FIXATION (ORIF) DISTAL RADIAL FRACTURE Left 02/24/2016   Procedure: OPEN TREATMENT OF LEFT DISTAL RADIUS FRACTURE;  Surgeon: Milly Jakob, MD;  Location: [facility];  Service: Orthopedics;  Laterality: Left;  . ORIF RIGHT ANKLE FX  10/07/2008  . PERCUTANEOUS CORONARY STENT INTERVENTION (PCI-S) N/A 04/21/2014   Procedure: PERCUTANEOUS CORONARY STENT INTERVENTION (PCI-S);  Surgeon: [name], MD;  Location: [facility] CATH LAB;  Service: Cardiovascular;  Laterality: N/A;   BMS in the ostial/proximal SVG to CFX,  stable 50-70% ostial SVG to RCA (previous stenting)  . TEE WITHOUT CARDIOVERSION N/A 06/26/2016   Procedure: TRANSESOPHAGEAL ECHOCARDIOGRAM (TEE);  Surgeon: Burnell Blanks, MD;  Location: [facility];  Service: Open Heart Surgery;  Laterality: N/A;  . THULIUM LASER TURP (TRANSURETHRAL RESECTION OF PROSTATE) N/A 12/05/2016   Procedure: THULIUM LASER TURP (TRANSURETHRAL RESECTION OF PROSTATE);  Surgeon: Nickie Retort, MD;  Location: [facility];  Service: Urology;  Laterality: N/A;  . TRANSCATHETER AORTIC VALVE REPLACEMENT, TRANSFEMORAL Bilateral 06/26/2016   Procedure: TRANSCATHETER AORTIC VALVE REPLACEMENT, TRANSFEMORAL;  Surgeon: Burnell Blanks, MD;  Location: [facility];  Service: Open Heart Surgery;  Laterality: Bilateral;  . TRANSTHORACIC ECHOCARDIOGRAM  08/03/2016    post TAVR    ef 40-45%,  akinesis and scarring of the basal-inferolateral, inferior, and  inferoseptal (consistent w/ infarction in distribution of the RCA), severe hypokinesis of the basal anteroseptal (consistent w/ ischemia in distribution of the LAD but w/ patent distal graft),  grade 2 diastolic dysfunction/  AV stent valve bioprosthesis normal function w/ trivial regurg (valve area 1.16cm^2)/  . TRANSTHORACIC ECHOCARDIOGRAM  08-03-2016  continued results- post TAVR   mild dilated ascending aorta/  mild to moderate MR, peak grandient 62mmHg/  severe LAE and mild RAE/  mild PR/  trivial TR        Home Medications    Prior to Admission medications   Medication Sig Start Date End Date Taking? Authorizing Provider  acetaminophen (TYLENOL) 500 MG tablet Take 1 tablet (500 mg total) by mouth every 6 (six) hours as needed for mild pain (knee pain). 05/23/18   Ghimire, Henreitta Leber, MD  amiodarone (PACERONE) 200 MG tablet Take 200 mg by mouth daily.    [provider]  aspirin EC 81 MG tablet Take 81 mg by mouth daily.     [provider]  atorvastatin (LIPITOR) 40 MG tablet TAKE 1 TABLET (40 MG TOTAL) BY MOUTH DAILY AT 6 PM. PLEASE SCHEDULE ANNUAL APPOINTMENT THANKS. Patient taking differently: Take 40 mg by mouth daily at 6 PM.  04/11/18   Belva Crome, MD  B Complex-C (B-COMPLEX WITH VITAMIN C) tablet Take 1 tablet by mouth 3 (three) times a week.     [provider]  bicalutamide (CASODEX) 50 MG tablet Take 1 tablet (50 mg total) by mouth daily. 05/23/18   Ghimire, Henreitta Leber, MD  dronabinol (MARINOL) 2.5 MG capsule Take 1 capsule (2.5 mg total) by mouth 3 (three) times daily before meals. 05/23/18   Ghimire, Henreitta Leber, MD  folic acid (FOLVITE) 1 MG tablet Take 1 tablet (1 mg total) by mouth 3 (three) times a week. 05/23/18   Ghimire, Henreitta Leber, MD  GARLIC PO Take 1 tablet by mouth daily.     [provider]  levothyroxine (SYNTHROID, LEVOTHROID) 125 MCG tablet Take 125 mcg by mouth daily before breakfast.  11/23/14   [provider]  Magnesium  200 MG TABS Take 225 mg by mouth every other day.     [provider]  Multiple Vitamins-Iron (CHLORELLA PO) Take 6 tablets by mouth 2 (two) times daily. MED NAME: CHLORELLA     [provider]  nitroGLYCERIN (NITROSTAT) 0.4 MG SL tablet Place 1 tablet (0.4 mg total) under the tongue every 5 (five) minutes as needed for chest pain. 01/22/17   Daneen Schick  W, MD  oxyCODONE (OXY IR/ROXICODONE) 5 MG immediate release tablet Take 1 tablet (5 mg total) by mouth every 6 (six) hours as needed for moderate pain. 05/23/18   Ghimire, Henreitta Leber, MD  polyethylene glycol (MIRALAX / Floria Raveling) packet Take 17 g by mouth 2 (two) times daily. 05/23/18   Ghimire, Henreitta Leber, MD  potassium chloride (K-DUR) 10 MEQ tablet Take 1 tablet (10 mEq total) by mouth daily. 06/11/18   Daune Perch, NP  tamsulosin (FLOMAX) 0.4 MG CAPS capsule Take 0.4 mg by mouth 2 (two) times daily.  05/01/16   [provider]  tiZANidine (ZANAFLEX) 2 MG tablet Take 2 mg by mouth every 6 (six) hours as needed (rest).  05/13/18   [provider]  torsemide (DEMADEX) 20 MG tablet Take 2 (20 mg tablets) twice per day x 4 days.  Then take 2 (20 mg tablets) daily. 06/11/18   Daune Perch, NP  traZODone (DESYREL) 50 MG tablet Take 50 mg by mouth at bedtime.    [provider]  vitamin E 100 UNIT capsule Take 100 Units by mouth 3 (three) times a week.     [provider]    Family History Family History  Problem Relation Age of Onset  . Colon cancer Father   . Stroke Mother   . CAD Brother   . Stroke Sister   . Bone cancer Brother   . Parkinson's disease Brother   . Dementia Sister   . Stroke Sister     Social History Social History   Tobacco Use  . Smoking status: Former Smoker    Packs/day: 0.50    Years: 15.00    Pack years: 7.50    Types: Cigarettes    Last attempt to quit: 06/22/1961    Years since quitting: 57.0  . Smokeless tobacco: Never Used  Substance Use Topics  . Alcohol  use: No  . Drug use: No     Allergies   Patient has no known allergies.   Review of Systems Review of Systems  Unable to perform ROS: Mental status change     Physical Exam Updated Vital Signs BP (!) 76/60 (BP Location: Right Arm)   Pulse 83   Temp 98.6 F (37 C) (Oral)   Resp 19   Ht 5\' 10"  (1.778 m)   Wt 93 kg   SpO2 100%   BMI 29.42 kg/m   Physical Exam  Nursing note and vitals reviewed.  84 year old male, dyspneic at rest and in mild respiratory distress. Vital signs are significant for hypotension. Oxygen saturation is 100%, which is normal. Head is normocephalic and atraumatic. PERRLA, EOMI. Oropharynx is clear. Neck is nontender and supple without adenopathy. JVD is present. Back is nontender and there is no CVA tenderness. Lungs are clear without rales, wheezes, or rhonchi. Chest is nontender. Heart has regular rate and rhythm without murmur. Abdomen is soft, flat, nontender without masses or hepatosplenomegaly and peristalsis is normoactive. Extremities have 3+ edema, full range of motion is present. Skin is warm and dry.  Stage II sacral decubitus ulcer present. Neurologic: Oriented to person and place but not time, cranial nerves are intact, there are no motor or sensory deficits.  ED Treatments / Results  Labs (all labs ordered are listed, but only abnormal results are displayed) Labs Reviewed  BASIC METABOLIC PANEL - Abnormal; Notable for the following components:      Result Value   Chloride 91 (*)    Glucose, Bld 132 (*)  BUN 50 (*)    Creatinine, Ser 2.09 (*)    Calcium 6.5 (*)    GFR calc non Af Amer 26 (*)    GFR calc Af Amer 31 (*)    All other components within normal limits  CBC - Abnormal; Notable for the following components:   WBC 14.0 (*)    RBC 3.65 (*)    Hemoglobin 9.2 (*)    HCT 29.9 (*)    MCH 25.2 (*)    RDW 16.6 (*)    All other components within normal limits  I-STAT TROPONIN, ED - Abnormal; Notable for the following  components:   Troponin i, poc 0.52 (*)    All other components within normal limits    EKG EKG Interpretation  Date/Time:  Monday June 16 2018 22:45:21 EDT Ventricular Rate:  84 PR Interval:    QRS Duration: 126 QT Interval:  399 QTC Calculation: 472 R Axis:   118 Text Interpretation:  Sinus or ectopic atrial rhythm Multiple ventricular premature complexes Borderline prolonged PR interval RBBB and LPFB Anteroseptal infarct, old Repol abnrm suggests ischemia, diffuse leads When compared with ECG of 05/19/18, Wide QRS tachycardia is no longer present ST-t abnormality is unchanged Reconfirmed by Delora Fuel ([id number]) on 06/15/2018 11:00:00 PM   EKG Interpretation  Date/Time:  2018/06/30 00:18:12 EDT Ventricular Rate:  88 PR Interval:    QRS Duration: 144 QT Interval:  380 QTC Calculation: 460 R Axis:   95 Text Interpretation:  Sinus rhythm Prolonged PR interval Right bundle branch block ST depr, consider ischemia, anterolateral lds Baseline wander in lead(s) V3 When compared with ECG of 06/11/2018, Premature ventricular complexes are no longer present Confirmed by Delora Fuel ([id number]) on 06/30/2018 12:28:41 AM       Radiology Dg Chest Portable 1 View  Result Date: 06/22/2018 CLINICAL DATA:  84 y/o M; chest pain and shortness of breath with hypoxia. EXAM: PORTABLE CHEST 1 VIEW COMPARISON:  05/19/2018 CT chest.  05/16/2018 chest radiograph. FINDINGS: Stable cardiomegaly given projection and technique. Post CABG and aortic valve replacement. Aortic atherosclerosis with calcification. Mild reticular opacities, probably interstitial pulmonary edema. Left basilar opacity probably represents atelectasis and effusion, pneumonia is possible. No pneumothorax. Sclerotic bony lesions better characterized on prior CT chest. IMPRESSION: 1. Interstitial edema and left basilar opacity probably representing a small effusion and atelectasis. Pneumonia is possible. 2. Stable cardiomegaly and  aortic atherosclerosis. Electronically Signed   By: Kristine Garbe M.D.   On: 06/12/2018 23:27    Procedures Procedures  Cardiopulmonary Resuscitation (CPR) Procedure Note Directed/Performed by: Delora Fuel I personally directed ancillary staff and/or performed CPR in an effort to regain return of spontaneous circulation and to maintain cardiac, neuro and systemic perfusion.   CRITICAL CARE Performed by: Delora Fuel Total critical care time: 90 minutes Critical care time was exclusive of separately billable procedures and treating other patients. Critical care was necessary to treat or prevent imminent or life-threatening deterioration. Critical care was time spent personally by me on the following activities: development of treatment plan with patient and/or surrogate as well as nursing, discussions with consultants, evaluation of patient's response to treatment, examination of patient, obtaining history from patient or surrogate, ordering and performing treatments and interventions, ordering and review of laboratory studies, ordering and review of radiographic studies, pulse oximetry and re-evaluation of patient's condition.  Medications Ordered in ED Medications  aspirin chewable tablet 324 mg (has no administration in time range)  DOBUTamine (DOBUTREX) infusion 4000 mcg/mL (has  no administration in time range)     Initial Impression / Assessment and Plan / ED Course  I have reviewed the triage vital signs and the nursing notes.  Pertinent labs & imaging results that were available during my care of the patient were reviewed by me and considered in my medical decision making (see chart for details).  Chest pain and worsening dyspnea which appears to be exacerbation of heart failure.  Chest x-ray is consistent with CHF.  ECG shows ST depression but is unchanged from prior ECG.  Labs do show evidence of acute kidney injury with creatinine 2.09 (was 1.18 on September 3).   Troponin is mildly elevated at 0.52-unclear if this is demand ischemia or new non-STEMI.  When hospitalized last month, troponin was as high as 1.73.  Hemoglobin has also dropped from 11.0 on August 15, to 9.2 today.  Thrombocytopenia is present and unchanged from baseline.  Hypotension consistent with cardiogenic shock.  This is a very difficult situation to manage.  He needs to be diuresed, but cannot be diuresed in the setting of hypotension.  He is given aspirin and started on dobutamine for pressure support.  Will need cardiology consultation.  CODE STATUS was clarified with family as noted above.  00:15  AM Patient developed a wide-complex rhythm with loss of pulses.  CPR was initiated.  He was given intravenous epinephrine and calcium.  Heart rhythm did get an hour and he did have return of spontaneous circulation.  Family was present during resuscitation.  Per their wishes, no intubation was done.  He is being continued with dobutamine drip.  Cardiology consultation is still pending.  1:00 AM Discussed with Dr. Lennie Muckle of cardiology service who agrees to see the patient in consultation, a expresses concern that patient additional medical problems would make him more appropriate for admission to hospitalist service.  Will discuss again after he has evaluated the patient.  1:20 AM Discussed again with cardiologist who has evaluated the patient, family wishes to proceed with palliative care.  Hospitalist has been paged.  1:42 AM Case discussed with Dr. Hal Hope of [facility], who agrees to admit the patient.  Final Clinical Impressions(s) / ED Diagnoses   Final diagnoses:  Acute on chronic combined systolic and diastolic heart failure (HCC)  Precordial chest pain  Cardiac arrest (HCC)  Elevated troponin I level  Sacral decubitus ulcer, stage II  Acute kidney injury (nontraumatic) (HCC)  Normocytic anemia  Thrombocytopenia [facility])    ED Discharge Orders    None         Delora Fuel, MD 71/06/26 0143  2:16 AM Family has requested that patient be made DNR.  Having observed CPR being done on him, they do not wish CPR to be done, and do not wish defibrillation to be done.  DNR is placed.   Delora Fuel, MD 94/85/46 [phone]

## 2018-06-16 NOTE — Telephone Encounter (Signed)
Spoke with patient's daughter Jackelyn Poling) [phone] in regards to a lab visit 9/11 here in the office.  She is trying to correlate labs with Dr Moses Manners office [phone].  They are also seeing him on 9/11.  I left a message for the office to contact us to review.

## 2018-06-16 NOTE — Telephone Encounter (Signed)
Spoke to Dr Moses Manners office and patient's daughter and arranged the lab draw (BMET) at DR [facility] office.

## 2018-06-16 NOTE — Telephone Encounter (Signed)
Informed patient's daughter that I have left a message for Dr Moses Manners nurse, yet have not heard a response.

## 2018-06-16 NOTE — ED Notes (Signed)
Dr.Glick and Sherlyn Lick notified of critical troponin results

## 2018-06-17 ENCOUNTER — Encounter (HOSPITAL_COMMUNITY): Payer: Self-pay | Admitting: Internal Medicine

## 2018-06-17 DIAGNOSIS — N183 Chronic kidney disease, stage 3 (moderate): Secondary | ICD-10-CM | POA: Diagnosis present

## 2018-06-17 DIAGNOSIS — N400 Enlarged prostate without lower urinary tract symptoms: Secondary | ICD-10-CM | POA: Diagnosis present

## 2018-06-17 DIAGNOSIS — I5043 Acute on chronic combined systolic (congestive) and diastolic (congestive) heart failure: Secondary | ICD-10-CM | POA: Diagnosis not present

## 2018-06-17 DIAGNOSIS — I714 Abdominal aortic aneurysm, without rupture: Secondary | ICD-10-CM | POA: Diagnosis present

## 2018-06-17 DIAGNOSIS — I5021 Acute systolic (congestive) heart failure: Secondary | ICD-10-CM | POA: Diagnosis present

## 2018-06-17 DIAGNOSIS — A419 Sepsis, unspecified organism: Secondary | ICD-10-CM | POA: Diagnosis present

## 2018-06-17 DIAGNOSIS — R011 Cardiac murmur, unspecified: Secondary | ICD-10-CM | POA: Diagnosis present

## 2018-06-17 DIAGNOSIS — L89152 Pressure ulcer of sacral region, stage 2: Secondary | ICD-10-CM | POA: Diagnosis present

## 2018-06-17 DIAGNOSIS — R579 Shock, unspecified: Secondary | ICD-10-CM | POA: Diagnosis not present

## 2018-06-17 DIAGNOSIS — Z66 Do not resuscitate: Secondary | ICD-10-CM | POA: Diagnosis present

## 2018-06-17 DIAGNOSIS — N179 Acute kidney failure, unspecified: Secondary | ICD-10-CM | POA: Diagnosis present

## 2018-06-17 DIAGNOSIS — C7951 Secondary malignant neoplasm of bone: Secondary | ICD-10-CM | POA: Diagnosis present

## 2018-06-17 DIAGNOSIS — I2510 Atherosclerotic heart disease of native coronary artery without angina pectoris: Secondary | ICD-10-CM | POA: Diagnosis present

## 2018-06-17 DIAGNOSIS — I252 Old myocardial infarction: Secondary | ICD-10-CM | POA: Diagnosis not present

## 2018-06-17 DIAGNOSIS — E782 Mixed hyperlipidemia: Secondary | ICD-10-CM | POA: Diagnosis present

## 2018-06-17 DIAGNOSIS — I472 Ventricular tachycardia: Secondary | ICD-10-CM | POA: Diagnosis not present

## 2018-06-17 DIAGNOSIS — I130 Hypertensive heart and chronic kidney disease with heart failure and stage 1 through stage 4 chronic kidney disease, or unspecified chronic kidney disease: Secondary | ICD-10-CM | POA: Diagnosis present

## 2018-06-17 DIAGNOSIS — G629 Polyneuropathy, unspecified: Secondary | ICD-10-CM | POA: Diagnosis present

## 2018-06-17 DIAGNOSIS — I462 Cardiac arrest due to underlying cardiac condition: Secondary | ICD-10-CM | POA: Diagnosis not present

## 2018-06-17 DIAGNOSIS — R0902 Hypoxemia: Secondary | ICD-10-CM | POA: Diagnosis present

## 2018-06-17 DIAGNOSIS — R918 Other nonspecific abnormal finding of lung field: Secondary | ICD-10-CM | POA: Diagnosis present

## 2018-06-17 DIAGNOSIS — E039 Hypothyroidism, unspecified: Secondary | ICD-10-CM | POA: Diagnosis present

## 2018-06-17 DIAGNOSIS — D696 Thrombocytopenia, unspecified: Secondary | ICD-10-CM | POA: Diagnosis present

## 2018-06-17 DIAGNOSIS — R339 Retention of urine, unspecified: Secondary | ICD-10-CM | POA: Diagnosis present

## 2018-06-17 DIAGNOSIS — I469 Cardiac arrest, cause unspecified: Secondary | ICD-10-CM | POA: Diagnosis not present

## 2018-06-17 DIAGNOSIS — Z8572 Personal history of non-Hodgkin lymphomas: Secondary | ICD-10-CM | POA: Diagnosis not present

## 2018-06-17 DIAGNOSIS — Z515 Encounter for palliative care: Secondary | ICD-10-CM | POA: Diagnosis not present

## 2018-06-17 DIAGNOSIS — I214 Non-ST elevation (NSTEMI) myocardial infarction: Secondary | ICD-10-CM | POA: Diagnosis not present

## 2018-06-17 LAB — TROPONIN I: Troponin I: 0.33 ng/mL (ref <0.03)

## 2018-06-17 LAB — HEPATIC FUNCTION PANEL
ALBUMIN: 2.5 g/dL — AB (ref 3.5–5.0)
ALT: 30 U/L (ref 0–44)
AST: 52 U/L — ABNORMAL HIGH (ref 15–41)
Alkaline Phosphatase: 102 U/L (ref 38–126)
BILIRUBIN TOTAL: 1.3 mg/dL — AB (ref 0.3–1.2)
Bilirubin, Direct: 0.5 mg/dL — ABNORMAL HIGH (ref 0.0–0.2)
Indirect Bilirubin: 0.8 mg/dL (ref 0.3–0.9)
Total Protein: 5.9 g/dL — ABNORMAL LOW (ref 6.5–8.1)

## 2018-06-17 LAB — BRAIN NATRIURETIC PEPTIDE: B NATRIURETIC PEPTIDE 5: 3350.8 pg/mL — AB (ref 0.0–100.0)

## 2018-06-17 MED ORDER — ACETAMINOPHEN 325 MG PO TABS: 650.0000 mg | ORAL_TABLET | Freq: Four times a day (QID) | ORAL | Status: DC | PRN

## 2018-06-17 MED ORDER — MORPHINE SULFATE (PF) 2 MG/ML IV SOLN: 1.0000 mg | INTRAVENOUS | Status: DC | PRN

## 2018-06-17 MED ORDER — ACETAMINOPHEN 650 MG RE SUPP: 650.0000 mg | Freq: Four times a day (QID) | RECTAL | Status: DC | PRN

## 2018-06-17 MED ORDER — CALCIUM CHLORIDE 10 % IV SOLN
INTRAVENOUS | Status: AC | PRN
  Administered 2018-06-17: 1 g via INTRAVENOUS

## 2018-06-17 MED ORDER — MIDAZOLAM HCL 2 MG/2ML IJ SOLN
1.0000 mg | Freq: Once | INTRAMUSCULAR | Status: AC
  Administered 2018-06-17: 1 mg via INTRAVENOUS
  Filled 2018-06-17: qty 2

## 2018-06-17 MED ORDER — EPINEPHRINE PF 1 MG/10ML IJ SOSY
PREFILLED_SYRINGE | INTRAMUSCULAR | Status: AC | PRN
  Administered 2018-06-17: 1 mg via INTRAVENOUS

## 2018-06-17 MED ORDER — ONDANSETRON HCL 4 MG PO TABS: 4.0000 mg | ORAL_TABLET | Freq: Four times a day (QID) | ORAL | Status: DC | PRN

## 2018-06-17 MED ORDER — ONDANSETRON HCL 4 MG/2ML IJ SOLN: 4.0000 mg | Freq: Four times a day (QID) | INTRAMUSCULAR | Status: DC | PRN

## 2018-06-18 ENCOUNTER — Other Ambulatory Visit: Payer: Medicare Other

## 2018-06-18 ENCOUNTER — Inpatient Hospital Stay: Payer: Medicare Other

## 2018-06-18 ENCOUNTER — Ambulatory Visit: Payer: Medicare Other | Admitting: Hematology & Oncology

## 2018-06-19 ENCOUNTER — Other Ambulatory Visit: Payer: Medicare Other

## 2018-06-19 MED FILL — Medication: Qty: 1 | Status: AC

## 2018-06-27 ENCOUNTER — Ambulatory Visit: Payer: Medicare Other | Admitting: Cardiology

## 2018-07-08 NOTE — Discharge Summary (Signed)
Death summary -   Course in the hospital -84 year old male with history of chronic systolic heart failure CAD status post CABG recently admitted for sepsis and non-ST elevation MI and CHF at that time patient also had nonsustained V. tach and was brought to the ER after patient suddenly became short of breath.  As per the family patient was doing fine and last night suddenly became short of breath did not complain of any chest pain did not have any nausea vomiting or diarrhea or fever chills.  In the ER patient was found to be hypoxic and hypotensive was placed on DiaBeta mine following which patient had V. tach arrest patient was given epinephrine and CPR.  Patient regained consciousness and pulse but developed confusion cardiology was consulted at this time patient's family requested complete comfort measures.  Patient was placed on comfort measures and no further labs as requested by family.  At around 5:59 AM on 24-Jun-2018 patient was pronounced dead.  Family at the bedside.  Final diagnosis -  #1.  Shock likely cardiogenic. #2.  Status post V. tach arrest. #3.  Acute congestive systolic heart failure. #4.  Acute renal failure likely from hypotension and sepsis. #5.  History of TAVR. #6.  History of hypothyroidism. #7.  Chronic anemia. #8.  Thrombocytopenia cause not clear. #9.  Metastatic cancer likely from prostate.

## 2018-07-08 NOTE — Progress Notes (Signed)
MD on call notified of patient death. [place] [facility] referral # [id number] Lennox). Full release and not a candidate for organ donation.

## 2018-07-08 NOTE — ED Notes (Signed)
Informed by Patient Placement that they cannot place pt in med-surg room while on titratable vasopressors. Hal Hope, MD notified and speaking with family via phone.   Discontinuing dobutamine drip, per MD order and family approval.

## 2018-07-08 NOTE — ED Notes (Signed)
Pt becoming more agitated and restless. MD notified.

## 2018-07-08 NOTE — Code Documentation (Signed)
Family at bedside requesting staff stop CPR.

## 2018-07-08 NOTE — H&P (Signed)
History and Physical    [name] WUJ:[id number] DOB: [birth year] DOA: 06/09/2018  PCP: Tim Huddle, MD  Patient coming from: Home.  Chief Complaint: Shortness of breath.  HPI: Tim Campbell is a 84 y.o. male with history of chronic systolic heart failure, CAD status post CABG, recently admitted for sepsis and non-ST elevation MI and CHF and at that time also had sustained V. tach was brought to the ER after patient suddenly became short of breath.  As per the family patient was doing fine and last night suddenly became short of breath did not complain of any chest pain did not have any nausea vomiting or diarrhea or fever or chills.  ED Course: In the ER patient was hypoxic and hypotensive.  Was placed on dobutamine.  Following which patient had V. tach arrest.  Patient was given epinephrine and CPR.  Patient regained consciousness and pulse was developed confused.  Cardiology was consulted.  At this time family has requested comfort measures.  Review of Systems: As per HPI, rest all negative.   Past Medical History:  Diagnosis Date  . AAA (abdominal aortic aneurysm) ([place])    a. 3.3x3.0cm by CT 05/2016.  Marland Kitchen BPH (benign prostatic hyperplasia)   . Chronic combined systolic and diastolic HF (heart failure), NYHA class 3   . Coronary artery disease CARDIOLOGIST-  DR Angelena Form   a. s/p CABGx5 in 1993. b. NSTEMI with occluded VG-diag in 2005. c. BMS to prox VG-PDA 2012. d. BMS to SVG-OM 03/2014. e. stable cath 05/2016.  . Diverticulosis of colon   . Heart murmur   . History of colon polyps    2002;  2007  . History of non-ST elevation myocardial infarction (NSTEMI)    x2 --- 12-29-2003  and  01-01-2013  . Hypertension   . Hypothyroidism   . Lower urinary tract symptoms (LUTS)   . Mixed hyperlipidemia   . Non Hodgkin's lymphoma ([place]) dx 2006 bone marrow bx--- onocologist-  dr Marin Olp   s/p  chemo and radiation therpay--- in remission since ---  . OA (osteoarthritis)   .  Peripheral neuropathy   . Prostate cancer metastatic to bone ([place]) 06/11/2018  . Pulmonary nodules    a. by CT 05/2016  . RBBB (right bundle branch block)   . S/p bare metal coronary artery stent    10-16-2010  BMS to RCA graft  . S/P CABG x 5    05/ 1993  . 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 Urinary retention   . Wears dentures    upper  . Wears glasses     Past Surgical History:  Procedure Laterality Date  . CARDIAC CATHETERIZATION N/A 05/17/2016   Procedure: Right/Left Heart Cath and Coronary/Graft Angiography;  Surgeon: Nelva Bush, MD;  Location: [facility];  Service: Cardiovascular;  Laterality: N/A;  severe CAD,  total occlusion of mLAD, dLCx, and mRCA, 99% ostial LCX/ wide patent LIMA to LAD & SVG to OM, patent SVG to PDA/PL w/ stable 50% ISR at ostium SVG/ chronic occluded SVG to Diagonal/ severe AVS  . CARDIAC CATHETERIZATION  12-30-2003  dr Leonia Reeves   severe 3v cad:  occlused SVG to diagonal causing NSTEMI,  ef 45%  . CATARACT EXTRACTION W/ INTRAOCULAR LENS  IMPLANT, BILATERAL    . COLONOSCOPY    . CORONARY ANGIOPLASTY WITH STENT PLACEMENT  10-20-2010  dr Daneen Schick   BMS to ostium of the RCA graft:  total occlusion LAD, CFX, and  RCA,  bypass graft 's-- total occlusion SVG to diagonal,  high-grade obstruction prox.Alfredo Batty segment of RCA graft,  mid SVG to OM 50% otherwise patent/  mild AVS inferior wall hypokinesis  . CORONARY ARTERY BYPASS GRAFT  05/ 1993     dr gerhart   LIMA to LAD,  SVG to D1,  SVT to RCA,  SVG to OM,  SVG to PDA  . [place]  . I&D EXTREMITY Left 03/09/2016   Procedure: IRRIGATION AND DEBRIDEMENT WRIST;  Surgeon: Milly Jakob, MD;  Location: [place];  Service: Orthopedics;  Laterality: Left;  . INGUINAL HERNIA REPAIR Right 02/21/2006  . KNEE ARTHROSCOPY Right 1970'S   . LEFT AND RIGHT HEART CATHETERIZATION WITH CORONARY/GRAFT ANGIOGRAM N/A 04/05/2014   Procedure: LEFT AND RIGHT HEART CATHETERIZATION  WITH [name];  Surgeon: Burnell Blanks, MD;  Location: [facility];  Service: Cardiovascular;  Laterality: N/A;  . LEFT HEART CATHETERIZATION WITH CORONARY/GRAFT ANGIOGRAM N/A 01/02/2013   Procedure: LEFT HEART CATHETERIZATION WITH [name];  Surgeon: [name], MD;  Location: [facility];  Service: Cardiovascular;  Laterality: N/A;NSTEMI:side-to-side anastomosis w/the PDA is threatened(not amenable to PCI)in-stent restenosis  RCA graft,  50% osyial SVG to OM , LIMA to LAD patent w/ diffuse distal LAD disease/  severe 3V CAD w/ occlusion LAD, CFX, RCA, ef 45%  . OPEN BX LEFT CERVICAL MASS, POSTERIOR NECK  10/03/2004  . OPEN REDUCTION INTERNAL FIXATION (ORIF) DISTAL RADIAL FRACTURE Left 02/24/2016   Procedure: OPEN TREATMENT OF LEFT DISTAL RADIUS FRACTURE;  Surgeon: Milly Jakob, MD;  Location: [place];  Service: Orthopedics;  Laterality: Left;  . ORIF RIGHT ANKLE FX  10/07/2008  . PERCUTANEOUS CORONARY STENT INTERVENTION (PCI-S) N/A 04/21/2014   Procedure: PERCUTANEOUS CORONARY STENT INTERVENTION (PCI-S);  Surgeon: [name], MD;  Location: [facility] CATH LAB;  Service: Cardiovascular;  Laterality: N/A;   BMS in the ostial/proximal SVG to CFX,  stable 50-70% ostial SVG to RCA (previous stenting)  . TEE WITHOUT CARDIOVERSION N/A 06/26/2016   Procedure: TRANSESOPHAGEAL ECHOCARDIOGRAM (TEE);  Surgeon: Burnell Blanks, MD;  Location: [place];  Service: Open Heart Surgery;  Laterality: N/A;  . THULIUM LASER TURP (TRANSURETHRAL RESECTION OF PROSTATE) N/A 12/05/2016   Procedure: THULIUM LASER TURP (TRANSURETHRAL RESECTION OF PROSTATE);  Surgeon: Nickie Retort, MD;  Location: [facility];  Service: Urology;  Laterality: N/A;  . TRANSCATHETER AORTIC VALVE REPLACEMENT, TRANSFEMORAL Bilateral 06/26/2016   Procedure: TRANSCATHETER AORTIC VALVE REPLACEMENT, TRANSFEMORAL;  Surgeon: Burnell Blanks, MD;  Location: [place];  Service: Open Heart Surgery;   Laterality: Bilateral;  . TRANSTHORACIC ECHOCARDIOGRAM  08/03/2016    post TAVR    ef 40-45%,  akinesis and scarring of the basal-inferolateral, inferior, and inferoseptal (consistent w/ infarction in distribution of the RCA), severe hypokinesis of the basal anteroseptal (consistent w/ ischemia in distribution of the LAD but w/ patent distal graft),  grade 2 diastolic dysfunction/  AV stent valve bioprosthesis normal function w/ trivial regurg (valve area 1.16cm^2)/  . TRANSTHORACIC ECHOCARDIOGRAM  08-03-2016  continued results- post TAVR   mild dilated ascending aorta/  mild to moderate MR, peak grandient 29mmHg/  severe LAE and mild RAE/  mild PR/  trivial TR     reports that he quit smoking about 57 years ago. His smoking use included cigarettes. He has a 7.50 pack-year smoking history. He has never used smokeless tobacco. He reports that he does not drink alcohol or use drugs.  No Known Allergies  Family History  Problem Relation Age of Onset  . Colon cancer Father   . Stroke Mother   . CAD Brother   . Stroke Sister   . Bone cancer Brother   . Parkinson's disease Brother   . Dementia Sister   . Stroke Sister     Prior to Admission medications   Medication Sig Start Date End Date Taking? Authorizing Provider  acetaminophen (TYLENOL) 500 MG tablet Take 1 tablet (500 mg total) by mouth every 6 (six) hours as needed for mild pain (knee pain). Patient taking differently: Take 500 mg by mouth every 6 (six) hours as needed (for pain or knee pain).  05/23/18  Yes Ghimire, Henreitta Leber, MD  amiodarone (PACERONE) 200 MG tablet Take 200 mg by mouth daily.   Yes [provider]  aspirin EC 81 MG tablet Take 81 mg by mouth daily.    Yes [provider]  atorvastatin (LIPITOR) 40 MG tablet TAKE 1 TABLET (40 MG TOTAL) BY MOUTH DAILY AT 6 PM. PLEASE SCHEDULE ANNUAL APPOINTMENT THANKS. Patient taking differently: Take 40 mg by mouth daily at 6 PM.  04/11/18  Yes Belva Crome, MD  B  Complex-C (B-COMPLEX WITH VITAMIN C) tablet Take 1 tablet by mouth 3 (three) times a week.    Yes [provider]  bicalutamide (CASODEX) 50 MG tablet Take 1 tablet (50 mg total) by mouth daily. 05/23/18  Yes Ghimire, Henreitta Leber, MD  folic acid (FOLVITE) 1 MG tablet Take 1 tablet (1 mg total) by mouth 3 (three) times a week. 05/23/18  Yes Ghimire, Henreitta Leber, MD  levothyroxine (SYNTHROID, LEVOTHROID) 125 MCG tablet Take 125 mcg by mouth daily before breakfast.  11/23/14  Yes [provider]  Magnesium 200 MG TABS Take 200 mg by mouth daily.    Yes [provider]  nitroGLYCERIN (NITROSTAT) 0.4 MG SL tablet Place 1 tablet (0.4 mg total) under the tongue every 5 (five) minutes as needed for chest pain. 01/22/17  Yes Belva Crome, MD  oxyCODONE (OXY IR/ROXICODONE) 5 MG immediate release tablet Take 1 tablet (5 mg total) by mouth every 6 (six) hours as needed for moderate pain. 05/23/18  Yes Ghimire, Henreitta Leber, MD  potassium chloride (K-DUR) 10 MEQ tablet Take 1 tablet (10 mEq total) by mouth daily. 06/11/18  Yes Daune Perch, NP  tamsulosin (FLOMAX) 0.4 MG CAPS capsule Take 0.4 mg by mouth 2 (two) times daily.  05/01/16  Yes [provider]  tiZANidine (ZANAFLEX) 2 MG tablet Take 2 mg by mouth every 6 (six) hours as needed (for rest).  05/13/18  Yes [provider]  torsemide (DEMADEX) 20 MG tablet Take 2 (20 mg tablets) twice per day x 4 days.  Then take 2 (20 mg tablets) daily. 06/11/18  Yes Daune Perch, NP  traZODone (DESYREL) 50 MG tablet Take 50 mg by mouth at bedtime as needed for sleep.    Yes [provider]  vitamin E 100 UNIT capsule Take 100 Units by mouth daily.    Yes [provider]  dronabinol (MARINOL) 2.5 MG capsule Take 1 capsule (2.5 mg total) by mouth 3 (three) times daily before meals. Patient not taking: Reported on June 30, 2018 05/23/18   Jonetta Osgood, MD  polyethylene glycol [facility] / Floria Raveling) packet Take 17 g by mouth  2 (two) times daily. Patient not taking: Reported on June 30, 2018 05/23/18   Jonetta Osgood, MD    Physical Exam: Vitals:   2018/06/30 0340 06-30-18  0341 06/22/2018 0345 22-Jun-2018 0350  BP:  (!) 62/46  (!) 80/50  Pulse: 69 (!) 51 86 82  Resp: 14 (!) 9 (!) 31 (!) 27  Temp:      TempSrc:      SpO2: 92% 95% 99% 100%  Weight:      Height:          Constitutional: Moderately built and nourished. Vitals:   06-22-2018 0340 Jun 22, 2018 0341 2018/06/22 0345 06/22/2018 0350  BP:  (!) 62/46  (!) 80/50  Pulse: 69 (!) 51 86 82  Resp: 14 (!) 9 (!) 31 (!) 27  Temp:      TempSrc:      SpO2: 92% 95% 99% 100%  Weight:      Height:       Eyes: Anicteric no pallor. ENMT: No discharge from the ears eyes nose or mouth. Neck: No mass palpated no neck rigidity. Respiratory: No rhonchi or crepitations. Cardiovascular: S1-S2 heard no murmurs appreciated. Abdomen: Soft nontender bowel sounds present. Musculoskeletal: No edema.  No joint effusion. Skin: No rash.  Skin appears warm. Neurologic: Appears lethargic and does not follow commands. Psychiatric: Appears lethargic.   Labs on Admission: I have personally reviewed following labs and imaging studies  CBC: Recent Labs  Lab 06/09/2018 2255  WBC 14.0*  HGB 9.2*  HCT 29.9*  MCV 81.9  PLT 858*   Basic Metabolic Panel: Recent Labs  Lab 06/10/18 1212 07/07/2018 2255  NA 139 135  K 3.9 3.6  CL 97 91*  CO2 25 29  GLUCOSE 112* 132*  BUN 28* 50*  CREATININE 1.18 2.09*  CALCIUM 7.2* 6.5*   GFR: Estimated Creatinine Clearance: 27.5 mL/min (A) (by C-G formula based on SCr of 2.09 mg/dL (H)). Liver Function Tests: Recent Labs  Lab 06/18/2018 2343  AST 52*  ALT 30  ALKPHOS 102  BILITOT 1.3*  PROT 5.9*  ALBUMIN 2.5*   No results for input(s): LIPASE, AMYLASE in the last 168 hours. No results for input(s): AMMONIA in the last 168 hours. Coagulation Profile: No results for input(s): INR, PROTIME in the last 168 hours. Cardiac Enzymes: Recent  Labs  Lab 06/11/2018 2343  TROPONINI 0.33*   BNP (last 3 results) Recent Labs    06/10/18 1212  PROBNP 17,616*   HbA1C: No results for input(s): HGBA1C in the last 72 hours. CBG: No results for input(s): GLUCAP in the last 168 hours. Lipid Profile: No results for input(s): CHOL, HDL, LDLCALC, TRIG, CHOLHDL, LDLDIRECT in the last 72 hours. Thyroid Function Tests: No results for input(s): TSH, T4TOTAL, FREET4, T3FREE, THYROIDAB in the last 72 hours. Anemia Panel: No results for input(s): VITAMINB12, FOLATE, FERRITIN, TIBC, IRON, RETICCTPCT in the last 72 hours. Urine analysis:    Component Value Date/Time   COLORURINE YELLOW 05/16/2018 1507   APPEARANCEUR CLOUDY (A) 05/16/2018 1507   LABSPEC 1.020 05/16/2018 1507   PHURINE 6.0 05/16/2018 1507   GLUCOSEU NEGATIVE 05/16/2018 1507   HGBUR LARGE (A) 05/16/2018 1507   BILIRUBINUR NEGATIVE 05/16/2018 1507   KETONESUR NEGATIVE 05/16/2018 1507   PROTEINUR 100 (A) 05/16/2018 1507   UROBILINOGEN 0.2 06/01/2015 0850   NITRITE NEGATIVE 05/16/2018 1507   LEUKOCYTESUR TRACE (A) 05/16/2018 1507   Sepsis Labs: @LABRCNTIP (procalcitonin:4,lacticidven:4) )No results found for this or any previous visit (from the past 240 hour(s)).   Radiological Exams on Admission: Dg Chest Portable 1 View  Result Date: 06/19/2018 CLINICAL DATA:  84 y/o M; chest pain and shortness of breath with hypoxia. EXAM: PORTABLE  CHEST 1 VIEW COMPARISON:  05/19/2018 CT chest.  05/16/2018 chest radiograph. FINDINGS: Stable cardiomegaly given projection and technique. Post CABG and aortic valve replacement. Aortic atherosclerosis with calcification. Mild reticular opacities, probably interstitial pulmonary edema. Left basilar opacity probably represents atelectasis and effusion, pneumonia is possible. No pneumothorax. Sclerotic bony lesions better characterized on prior CT chest. IMPRESSION: 1. Interstitial edema and left basilar opacity probably representing a small effusion  and atelectasis. Pneumonia is possible. 2. Stable cardiomegaly and aortic atherosclerosis. Electronically Signed   By: Kristine Garbe M.D.   On: 06/23/2018 23:27    EKG: Independently reviewed.  Normal sinus rhythm.  Assessment/Plan Principal Problem:   Shock ([place]) Active Problems:   S/P TAVR (transcatheter aortic valve replacement)   Hypothyroidism   Acute renal failure superimposed on stage 3 chronic kidney disease (HCC)   Non-ST elevated myocardial infarction ([place])   VT (ventricular tachycardia) (HCC)   Prostate cancer metastatic to bone ([place])    1. Shock likely could be from sepsis versus cardiogenic.  Most likely sepsis. 2. Non-ST elevation MI. 3. Acute congestive heart failure last EF measured was 30 to 35% in August 2019. 4. Acute renal failure likely from hypotension and sepsis. 5. V. tach arrest. 6. Hypothyroidism. 7. Status post TAVR. 8. Anemia appears to be chronic. 9. Thrombocytopenia. 10. History of CAD status post CABG. 11. Metastatic cancer likely prostate.  Plan -at this time after discussing with patient's family including patient's wife family has requested complete comfort measures.  Appreciate cardiology input.  Patient is a DNR and has been placed on morphine.  Palliative care consult.  No labs or any further medication other than morphine has been ordered.   DVT prophylaxis: SCDs. Code Status: DNR. Family Communication: Patient's wife and other family members. Disposition Plan: To be determined.  Likely terminal. Consults called: Palliative care.  Cardiology. Admission status: Inpatient.   Rise Patience MD [facility] Pager [phone].  If 7PM-7AM, please contact night-coverage [email or web address] Password [facility]  02-Jul-2018, 3:54 AM

## 2018-07-08 NOTE — Consult Note (Addendum)
Cardiology Consultation:   Patient ID: Tim Campbell; [id number]; 1925-11-24   Admit date: 06/23/2018 Date of Consult: 07-09-2018  Primary Care Provider: [name], MD Primary Cardiologist: [name], MD Primary Electrophysiologist:  None  Chief Complaint: Altered mental status  Patient Profile:   Tim Campbell is a 84 y.o. male with a hx of coronary artery disease (multiple vein graft interventions status-post CABG), severe aortic stenosis status post TAVR, heart failure with reduced EF (LVEF 30-35%), VT on amiodarone, non-Hodgkin's lymphoma, and recurrent urinary tract infections related to chronic indwelling foley catheter who is being seen today for the evaluation of hypotension and ventricular arrythmia at the request of Dr Roxanne Mins.  History of Present Illness:   Tim Campbell is a 84 y.o. male with a hx of coronary artery disease (multiple vein graft interventions status-post CABG), severe aortic stenosis status post TAVR, heart failure with reduced EF (LVEF 30-35%), VT on amiodarone, non-Hodgkin's lymphoma, and recurrent urinary tract infections related to chronic indwelling foley catheter who is being seen today for the evaluation of hypotension and ventricular arrythmia at the request of Dr Roxanne Mins.  - Presented with hypotension, altered mental status, dyspnea, chest pain - Started on dobutamine 5 mcg/kg/min due to concern for cardiogenic shock  - Subsequently developed pulseless ventricular tachycardia requiring chest compressions  - Currently minimally interactive on non rebreather, tachypneic, surrounded by family members   He was recently discharged on 8/16 after prolonged hospitalization for urinary tract infection, acute on chronic HF, metastatic cancer. He developed ventricular tachycardia requiring intravenous lidocaine infusion and was loaded on amiodarone. He underwent bone marrow biopsy without active bone marrow involvement.   Past Medical History:    Diagnosis Date  . AAA (abdominal aortic aneurysm) ([place])    a. 3.3x3.0cm by CT 05/2016.  Marland Kitchen BPH (benign prostatic hyperplasia)   . Chronic combined systolic and diastolic HF (heart failure), NYHA class 3   . Coronary artery disease CARDIOLOGIST-  DR Angelena Form   a. s/p CABGx5 in 1993. b. NSTEMI with occluded VG-diag in 2005. c. BMS to prox VG-PDA 2012. d. BMS to SVG-OM 03/2014. e. stable cath 05/2016.  . Diverticulosis of colon   . Heart murmur   . History of colon polyps    2002;  2007  . History of non-ST elevation myocardial infarction (NSTEMI)    x2 --- 12-29-2003  and  01-01-2013  . Hypertension   . Hypothyroidism   . Lower urinary tract symptoms (LUTS)   . Mixed hyperlipidemia   . Non Hodgkin's lymphoma ([place]) dx 2006 bone marrow bx--- onocologist-  dr Marin Olp   s/p  chemo and radiation therpay--- in remission since ---  . OA (osteoarthritis)   . Peripheral neuropathy   . Prostate cancer metastatic to bone ([place]) 06/11/2018  . Pulmonary nodules    a. by CT 05/2016  . RBBB (right bundle branch block)   . S/p bare metal coronary artery stent    10-16-2010  BMS to RCA graft  . S/P CABG x 5    05/ 1993  . 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 Urinary retention   . Wears dentures    upper  . Wears glasses     Past Surgical History:  Procedure Laterality Date  . CARDIAC CATHETERIZATION N/A 05/17/2016   Procedure: Right/Left Heart Cath and Coronary/Graft Angiography;  Surgeon: Nelva Bush, MD;  Location: [place];  Service: Cardiovascular;  Laterality: N/A;  severe CAD,  total occlusion  of mLAD, dLCx, and mRCA, 99% ostial LCX/ wide patent LIMA to LAD & SVG to OM, patent SVG to PDA/PL w/ stable 50% ISR at ostium SVG/ chronic occluded SVG to Diagonal/ severe AVS  . CARDIAC CATHETERIZATION  12-30-2003  dr Leonia Reeves   severe 3v cad:  occlused SVG to diagonal causing NSTEMI,  ef 45%  . CATARACT EXTRACTION W/ INTRAOCULAR LENS  IMPLANT, BILATERAL    .  COLONOSCOPY    . CORONARY ANGIOPLASTY WITH STENT PLACEMENT  10-20-2010  dr Daneen Schick   BMS to ostium of the RCA graft:  total occlusion LAD, CFX, and RCA,  bypass graft 's-- total occlusion SVG to diagonal,  high-grade obstruction prox.Alfredo Batty segment of RCA graft,  mid SVG to OM 50% otherwise patent/  mild AVS inferior wall hypokinesis  . CORONARY ARTERY BYPASS GRAFT  05/ 1993     dr gerhart   LIMA to LAD,  SVG to D1,  SVT to RCA,  SVG to OM,  SVG to PDA  . [place]  . I&D EXTREMITY Left 03/09/2016   Procedure: IRRIGATION AND DEBRIDEMENT WRIST;  Surgeon: Milly Jakob, MD;  Location: [facility];  Service: Orthopedics;  Laterality: Left;  . INGUINAL HERNIA REPAIR Right 02/21/2006  . KNEE ARTHROSCOPY Right 1970'S   . LEFT AND RIGHT HEART CATHETERIZATION WITH CORONARY/GRAFT ANGIOGRAM N/A 04/05/2014   Procedure: LEFT AND RIGHT HEART CATHETERIZATION WITH [name];  Surgeon: Burnell Blanks, MD;  Location: [facility] CATH LAB;  Service: Cardiovascular;  Laterality: N/A;  . LEFT HEART CATHETERIZATION WITH CORONARY/GRAFT ANGIOGRAM N/A 01/02/2013   Procedure: LEFT HEART CATHETERIZATION WITH [name];  Surgeon: [name], MD;  Location: [facility] CATH LAB;  Service: Cardiovascular;  Laterality: N/A;NSTEMI:side-to-side anastomosis w/the PDA is threatened(not amenable to PCI)in-stent restenosis  RCA graft,  50% osyial SVG to OM , LIMA to LAD patent w/ diffuse distal LAD disease/  severe 3V CAD w/ occlusion LAD, CFX, RCA, ef 45%  . OPEN BX LEFT CERVICAL MASS, POSTERIOR NECK  10/03/2004  . OPEN REDUCTION INTERNAL FIXATION (ORIF) DISTAL RADIAL FRACTURE Left 02/24/2016   Procedure: OPEN TREATMENT OF LEFT DISTAL RADIUS FRACTURE;  Surgeon: Milly Jakob, MD;  Location: [place];  Service: Orthopedics;  Laterality: Left;  . ORIF RIGHT ANKLE FX  10/07/2008  . PERCUTANEOUS CORONARY STENT INTERVENTION (PCI-S) N/A 04/21/2014   Procedure: PERCUTANEOUS CORONARY STENT INTERVENTION  (PCI-S);  Surgeon: [name], MD;  Location: [facility] CATH LAB;  Service: Cardiovascular;  Laterality: N/A;   BMS in the ostial/proximal SVG to CFX,  stable 50-70% ostial SVG to RCA (previous stenting)  . TEE WITHOUT CARDIOVERSION N/A 06/26/2016   Procedure: TRANSESOPHAGEAL ECHOCARDIOGRAM (TEE);  Surgeon: Burnell Blanks, MD;  Location: [facility];  Service: Open Heart Surgery;  Laterality: N/A;  . THULIUM LASER TURP (TRANSURETHRAL RESECTION OF PROSTATE) N/A 12/05/2016   Procedure: THULIUM LASER TURP (TRANSURETHRAL RESECTION OF PROSTATE);  Surgeon: Nickie Retort, MD;  Location: [facility];  Service: Urology;  Laterality: N/A;  . TRANSCATHETER AORTIC VALVE REPLACEMENT, TRANSFEMORAL Bilateral 06/26/2016   Procedure: TRANSCATHETER AORTIC VALVE REPLACEMENT, TRANSFEMORAL;  Surgeon: Burnell Blanks, MD;  Location: [place];  Service: Open Heart Surgery;  Laterality: Bilateral;  . TRANSTHORACIC ECHOCARDIOGRAM  08/03/2016    post TAVR    ef 40-45%,  akinesis and scarring of the basal-inferolateral, inferior, and inferoseptal (consistent w/ infarction in distribution of the RCA), severe hypokinesis of the basal anteroseptal (consistent w/ ischemia in distribution of the LAD but w/  patent distal graft),  grade 2 diastolic dysfunction/  AV stent valve bioprosthesis normal function w/ trivial regurg (valve area 1.16cm^2)/  . TRANSTHORACIC ECHOCARDIOGRAM  08-03-2016  continued results- post TAVR   mild dilated ascending aorta/  mild to moderate MR, peak grandient 92mHg/  severe LAE and mild RAE/  mild PR/  trivial TR     Inpatient Medications: Scheduled Meds:  Continuous Infusions: . DOBUTamine 5 mcg/kg/min (0September 24, 20190059)   PRN Meds:   Home Meds: Prior to Admission medications   Medication Sig Start Date End Date Taking? Authorizing Provider  acetaminophen (TYLENOL) 500 MG tablet Take 1 tablet (500 mg total) by mouth every 6 (six) hours as needed for mild pain (knee pain). 05/23/18   Ghimire,  SHenreitta Leber MD  amiodarone (PACERONE) 200 MG tablet Take 200 mg by mouth daily.    [provider]  aspirin EC 81 MG tablet Take 81 mg by mouth daily.     [provider]  atorvastatin (LIPITOR) 40 MG tablet TAKE 1 TABLET (40 MG TOTAL) BY MOUTH DAILY AT 6 PM. PLEASE SCHEDULE ANNUAL APPOINTMENT THANKS. Patient taking differently: Take 40 mg by mouth daily at 6 PM.  04/11/18   SBelva Crome MD  B Complex-C (B-COMPLEX WITH VITAMIN C) tablet Take 1 tablet by mouth 3 (three) times a week.     [provider]  bicalutamide (CASODEX) 50 MG tablet Take 1 tablet (50 mg total) by mouth daily. 05/23/18   Ghimire, SHenreitta Leber MD  dronabinol (MARINOL) 2.5 MG capsule Take 1 capsule (2.5 mg total) by mouth 3 (three) times daily before meals. 05/23/18   Ghimire, SHenreitta Leber MD  folic acid (FOLVITE) 1 MG tablet Take 1 tablet (1 mg total) by mouth 3 (three) times a week. 05/23/18   Ghimire, SHenreitta Leber MD  GARLIC PO Take 1 tablet by mouth daily.     [provider]  levothyroxine (SYNTHROID, LEVOTHROID) 125 MCG tablet Take 125 mcg by mouth daily before breakfast.  11/23/14   [provider]  Magnesium 200 MG TABS Take 225 mg by mouth every other day.     [provider]  Multiple Vitamins-Iron (CHLORELLA PO) Take 6 tablets by mouth 2 (two) times daily. MED NAME: CHLORELLA     [provider]  nitroGLYCERIN (NITROSTAT) 0.4 MG SL tablet Place 1 tablet (0.4 mg total) under the tongue every 5 (five) minutes as needed for chest pain. 01/22/17   SBelva Crome MD  oxyCODONE (OXY IR/ROXICODONE) 5 MG immediate release tablet Take 1 tablet (5 mg total) by mouth every 6 (six) hours as needed for moderate pain. 05/23/18   Ghimire, SHenreitta Leber MD  polyethylene glycol (MIRALAX / GFloria Raveling packet Take 17 g by mouth 2 (two) times daily. 05/23/18   Ghimire, SHenreitta Leber MD  potassium chloride (K-DUR) 10 MEQ tablet Take 1 tablet (10 mEq total) by mouth daily. 06/11/18   HDaune Perch  NP  tamsulosin (FLOMAX) 0.4 MG CAPS capsule Take 0.4 mg by mouth 2 (two) times daily.  05/01/16   [provider]  tiZANidine (ZANAFLEX) 2 MG tablet Take 2 mg by mouth every 6 (six) hours as needed (rest).  05/13/18   [provider]  torsemide (DEMADEX) 20 MG tablet Take 2 (20 mg tablets) twice per day x 4 days.  Then take 2 (20 mg tablets) daily. 06/11/18   HDaune Perch NP  traZODone (DESYREL) 50 MG tablet Take 50 mg by mouth at bedtime.  [provider]  vitamin E 100 UNIT capsule Take 100 Units by mouth 3 (three) times a week.     [provider]    Allergies:   No Known Allergies  Social History:   Social History   Socioeconomic History  . Marital status: Married    Spouse name: Not on file  . Number of children: 3  . Years of education: Not on file  . Highest education level: Not on file  Occupational History  . 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 Financial resource strain: Not on file  . Food insecurity:    Worry: Not on file    Inability: Not on file  . Transportation needs:    Medical: Not on file    Non-medical: Not on file  Tobacco Use  . Smoking status: Former Smoker    Packs/day: 0.50    Years: 15.00    Pack years: 7.50    Types: Cigarettes    Last attempt to quit: 06/22/1961    Years since quitting: 57.0  . Smokeless tobacco: Never Used  Substance and Sexual Activity  . Alcohol use: No  . Drug use: No  . Sexual activity: Not on file  Lifestyle  . Physical activity:    Days per week: Not on file    Minutes per session: Not on file  . Stress: Not on file  Relationships  . Social connections:    Talks on phone: Not on file    Gets together: Not on file    Attends religious service: Not on file    Active member of club or organization: Not on file    Attends meetings of clubs or organizations: Not on file    Relationship status: Not on file  . Intimate partner  violence:    Fear of current or ex partner: Not on file    Emotionally abused: Not on file    Physically abused: Not on file    Forced sexual activity: Not on file  Other Topics Concern  . Not on file  Social History Narrative  . Not on file    Family History:   The patient's family history includes Bone cancer in his brother; CAD in his brother; Colon cancer in his father; Dementia in his sister; Parkinson's disease in his brother; Stroke in his mother, sister, and sister.  ROS:  Please see the history of present illness.  Full ROS not possible due to current critical illness  Physical Exam/Data:   Vitals:   2018-06-23 0046 06/23/18 0057 23-Jun-2018 0100 06-23-18 0105  BP:  (!) 75/53 (!) 73/53   Pulse: 78 88 87 85  Resp: (!) 21 (!) 27 (!) 29 (!) 30  Temp:      TempSrc:      SpO2: 90% 100% 100% 97%  Weight:      Height:        Intake/Output Summary (Last 24 hours) at 06-23-18 0125 Last data filed at 2018-06-23 0102 Gross per 24 hour  Intake 1003.82 ml  Output -  Net 1003.82 ml   Filed Weights   06/28/2018 2252  Weight: 93 kg   Body mass index is 29.42 kg/m.  General: Elderly man tachypneic, uncomfortable, in acute distress  Head: Normocephalic, atraumatic, sclera non-icteric, no xanthomas, nares are without discharge.  Neck: Negative for carotid bruits. JVP at mid-neck  Lungs: Tachypneic, non-rebreather  Heart: RRR with grade 2/6 holsystolic apical murmur Abdomen: Soft,  non-tender, non-distended with normoactive bowel sounds. No hepatomegaly. No rebound/guarding. No obvious abdominal masses. Msk:  Strength and tone appear normal for age. Extremities: Warm No clubbing or cyanosis. 2+ symmetric edema.  Distal pedal pulses are 2+ and equal bilaterally. Neuro: Not responding to conversation  EKG:  The EKG was personally reviewed and demonstrates normal sinus rhythm, right bundle branch block, diffuse precordial ST depression  Relevant CV Studies:  TTE 05/17/2018  (personal review) Left ventricle: The cavity size was mildly dilated. Wall   thickness was increased in a pattern of mild LVH. Systolic   function was moderately to severely reduced. The estimated   ejection fraction was in the range of 30% to 35%. Diffuse   hypokinesis. Doppler parameters are consistent with restrictive   physiology, indicative of decreased left ventricular diastolic   compliance and/or increased left atrial pressure. Doppler   parameters are consistent with high ventricular filling pressure. - Aortic valve: There is a 29 mm Edwards Sapien 3 tissue valve in   the AV position. Mild stable gradient across the AVR. Mean   gradient (S): 23 mm Hg. Valve area (VTI): 1.26 cm^2. Valve area   (Vmax): 1.56 cm^2. Valve area (Vmean): 1.11 cm^2. - Aorta: The visualized portion of the proximal ascending aorta is   mildly dilated at 4 cm. - Mitral valve: Mildly to moderately calcified annulus. Normal   thickness leaflets . Valve area by continuity equation (using   LVOT flow): 4.36 cm^2. - Left atrium: The atrium was severely dilated. - Right ventricle: The cavity size was mildly dilated. - Right atrium: The atrium was moderately dilated. - Atrial septum: No defect or patent foramen ovale was identified. - Tricuspid valve: There was moderate regurgitation. - Pulmonary arteries: Systolic pressure was moderately increased.   PA peak pressure: 54 mm Hg (S).   Laboratory Data:  Chemistry Recent Labs  Lab 06/10/18 1212 06/15/2018 2255  NA 139 135  K 3.9 3.6  CL 97 91*  CO2 25 29  GLUCOSE 112* 132*  BUN 28* 50*  CREATININE 1.18 2.09*  CALCIUM 7.2* 6.5*  GFRNONAA 54* 26*  GFRAA 63 31*  ANIONGAP  --  15    Recent Labs  Lab 06/29/2018 2343  PROT 5.9*  ALBUMIN 2.5*  AST 52*  ALT 30  ALKPHOS 102  BILITOT 1.3*   Hematology Recent Labs  Lab 06/28/2018 2255  WBC 14.0*  RBC 3.65*  HGB 9.2*  HCT 29.9*  MCV 81.9  MCH 25.2*  MCHC 30.8  RDW 16.6*  PLT 115*   Cardiac  Enzymes Recent Labs  Lab 06/30/2018 2343  TROPONINI 0.33*    Recent Labs  Lab 06/08/2018 2311  TROPIPOC 0.52*    BNP Recent Labs  Lab 06/10/18 1212 07/06/2018 2343  BNP  --  3,350.8*  PROBNP 17,616*  --     DDimer No results for input(s): DDIMER in the last 168 hours.  Radiology/Studies:  Dg Chest Portable 1 View  Result Date: 06/11/2018 CLINICAL DATA:  84 y/o M; chest pain and shortness of breath with hypoxia. EXAM: PORTABLE CHEST 1 VIEW COMPARISON:  05/19/2018 CT chest.  05/16/2018 chest radiograph. FINDINGS: Stable cardiomegaly given projection and technique. Post CABG and aortic valve replacement. Aortic atherosclerosis with calcification. Mild reticular opacities, probably interstitial pulmonary edema. Left basilar opacity probably represents atelectasis and effusion, pneumonia is possible. No pneumothorax. Sclerotic bony lesions better characterized on prior CT chest. IMPRESSION: 1. Interstitial edema and left basilar opacity probably representing a small effusion and atelectasis.  Pneumonia is possible. 2. Stable cardiomegaly and aortic atherosclerosis. Electronically Signed   By: Kristine Garbe M.D.   On: 06/29/2018 23:27    Assessment and Plan:   NAJIB COLMENARES is a 84 y.o. male with a hx of coronary artery disease (multiple vein graft interventions status-post CABG), severe aortic stenosis status post TAVR, heart failure with reduced EF (LVEF 30-35%), VT on amiodarone, non-Hodgkin's lymphoma, and recurrent urinary tract infections related to chronic indwelling foley catheter who is being seen today for the evaluation of hypotension and ventricular arrythmia at the request of Dr Roxanne Mins.  1. Undifferentiated shock. He presents with hypotension and evidence of end-organ hypoperfusion (acute kidney injury, altered mental status, impending respiratory failure) consistent with shock. This is most likely septic shock considering he is quite warm on examination indicating  peripheral vasodilatation and his most recent EF was ~35%. The most likely source of septic shock would be urinary tract infection. The patient's family is currently very understandably in favor of a comfort oriented approach considering his age, medical co-morbidities, and debility.   If aggressive care path is chosen I recommend the following 1) MICU admission and management of shock that is likely to be septic - blood cultures, antibiotics, central line, with potential need for intubation 2) Initiation of a lidocaine infusion (50 mg bolus followed by 1 mg/min) to suppress the ventricular arrhythmias that are likely to accompany use of vasopressors. I would tend to avoid IV amiodarone with hypotension.  3) Hold diuresis based on his current hypotension 4) Change pressors to norepinephrine. Dobutamine has a neutral effect on blood pressure and will not treat hypotension.   2. NSTEMI. This is likely due to hypotension in the setting of his severe 3-vessel coronary disease. There are no ST elevations on his electrocardiogram. I would not recommend initiation of medical therapy for an acute coronary syndrome at this time. Continue chronic CAD medications if within goals of care.   Please do not hesitate to contact cardiology if any questions arise.   For questions or updates, please contact [place] Please consult [email or web address] for contact info under Cardiology/STEMI.    Signed, Perley Jain, MD  2018-07-11 1:25 AM

## 2018-07-08 NOTE — Progress Notes (Signed)
  Placed patient on [place] @ 7lpm for patient comfort per MD

## 2018-07-08 NOTE — ED Notes (Signed)
ED Provider at bedside. 

## 2018-07-08 NOTE — Progress Notes (Signed)
Patient arrived on the floor not breathing, no pulse appreciated. Another RN in to assess and  pronounce patient death while he is still in the stretcher. Time of death 41. Family at bedside.

## 2018-07-08 NOTE — Progress Notes (Signed)
Chaplain responded to staff page to be with family at bedside of pt undergoing code.  Arrived and offered pastoral presence to family in pt room.  Offered support and conversation.  Family thanked chaplain for visit, stating pt had a strong faith and they were at peace with his situation.  They thanked chaplain for coming by and for offer of support.  Please page if further spiritual care is needed.

## 2018-07-08 NOTE — Code Documentation (Signed)
MD at bedside performing cardiac ultrasound. Family remains at bedside.

## 2018-07-08 NOTE — ED Notes (Signed)
Cards at bedside

## 2018-07-08 NOTE — ED Notes (Signed)
Family member to nurse's station, stating that if the pt were to arrest again, the family does NOT want any interventions. Clarified with family member that they do not wish compressions or defib. Family member confirmed. Roxanne Mins, MD notified.

## 2018-07-08 NOTE — Progress Notes (Signed)
Late entry, chart reviewed , patient passed away at 5:15am. Dr Hal Hope agreed to do death summary.

## 2018-07-08 DEATH — deceased

## 2018-07-30 ENCOUNTER — Ambulatory Visit: Payer: Medicare Other | Admitting: Interventional Cardiology

## 2020-04-07 IMAGING — MR MR LUMBAR SPINE W/O CM
4 of 5 series · 25 of 48 positions shown · non-contrast
Comparison: CT 06/06/2016.

CLINICAL DATA: Back pain. Left leg and hip pain over the last 3
weeks. Distant history of lymphoma.

EXAM:
MRI LUMBAR SPINE WITHOUT CONTRAST
TECHNIQUE: Multiplanar, multisequence MR imaging of the lumbar spine was
performed. No intravenous contrast was administered.

[Series 4: T1 · sagittal · 4.0mm · 0.59mm/px · 5 of 13 slices shown (1 of 2)]
[im 1/13]
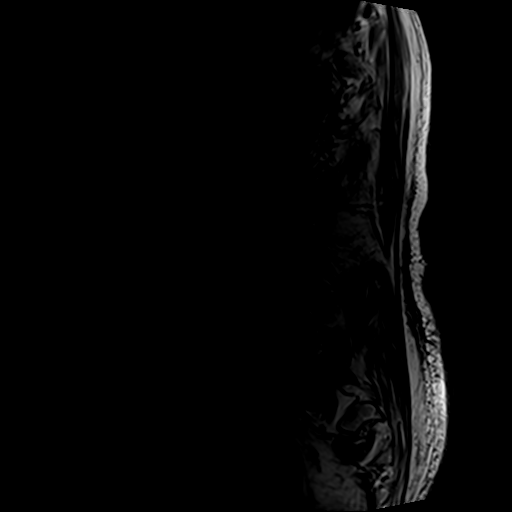
[im 4/13]
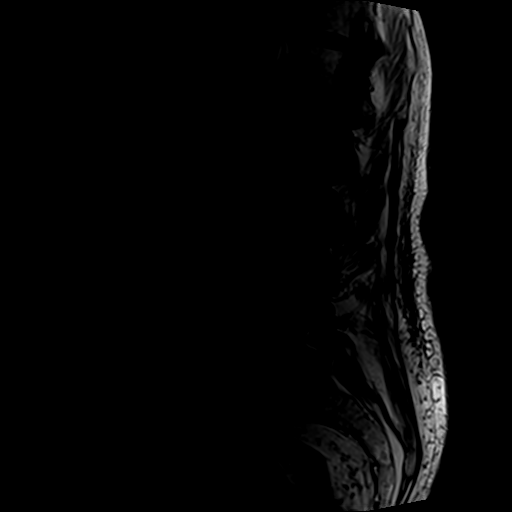
[im 7/13]
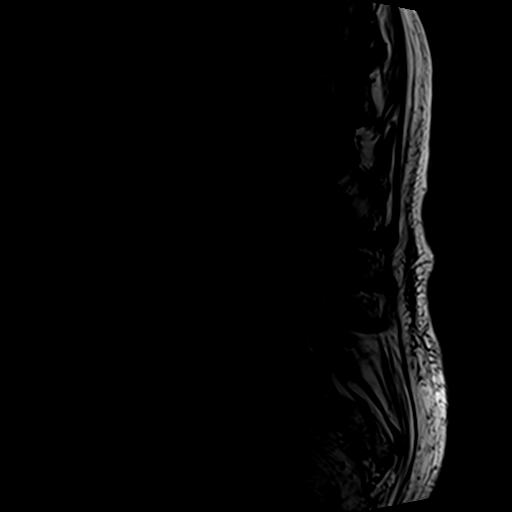
[im 10/13]
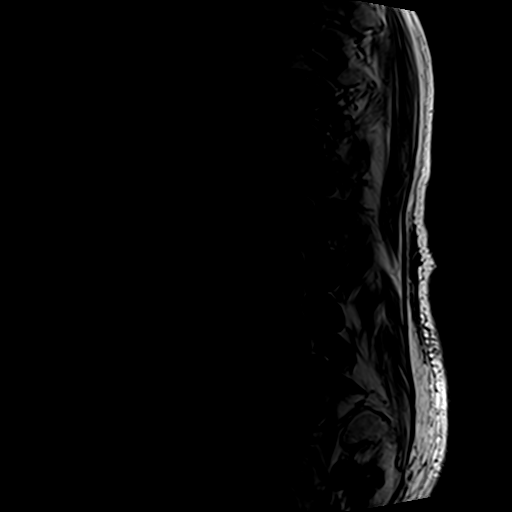
[im 13/13]
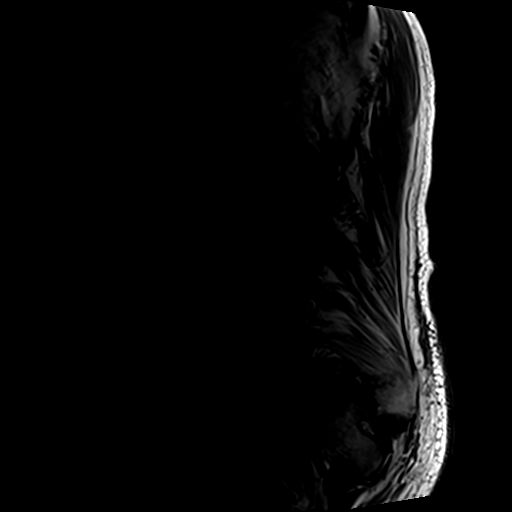

[Series 5: T2 post-contrast · sagittal · 4.0mm · 0.59mm/px · 5 of 13 slices shown]
[im 1/13]
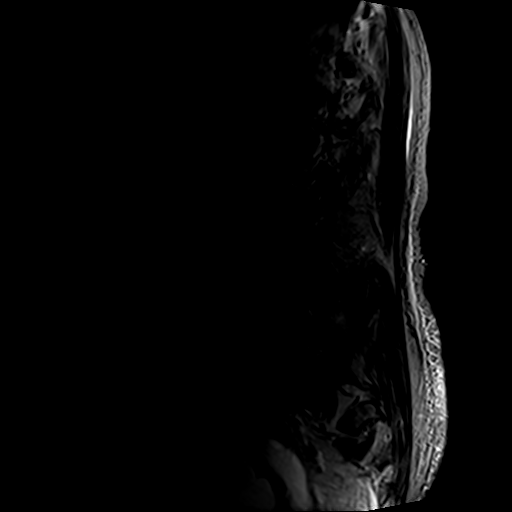
[im 4/13]
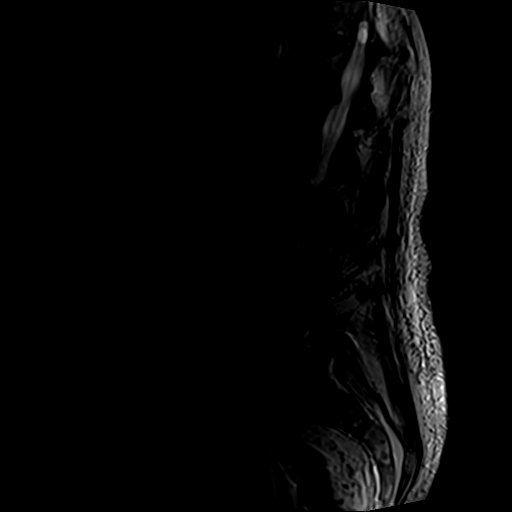
[im 7/13]
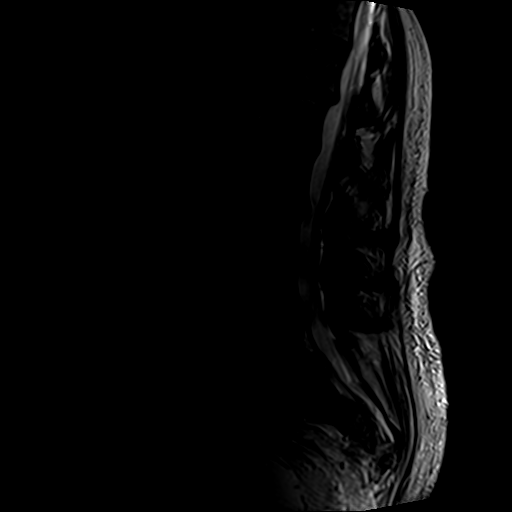
[im 10/13]
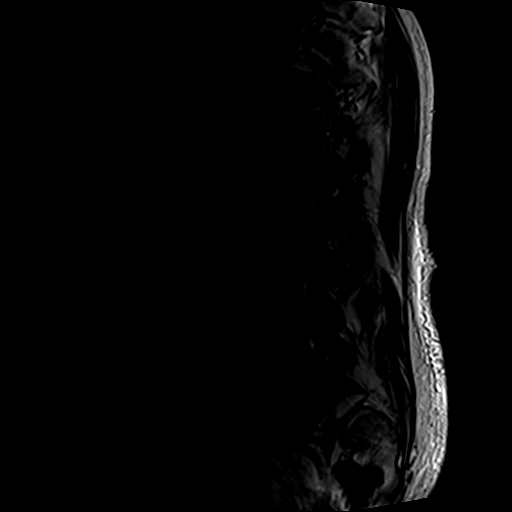
[im 13/13]
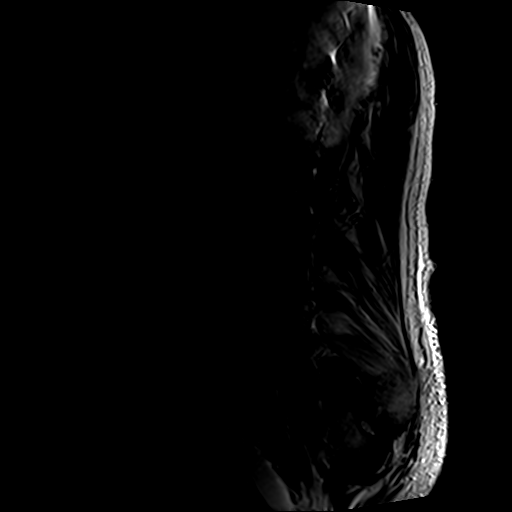

[Series 6: T2 · axial · 4.0mm · 0.70mm/px · z∈[-34,+194]mm · 11 of 46 slices shown]
[im 3/46]
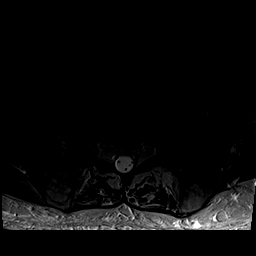
[im 6/46]
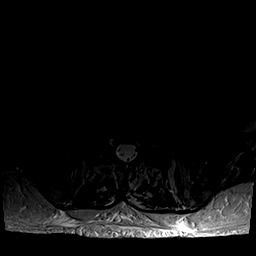
[im 9/46]
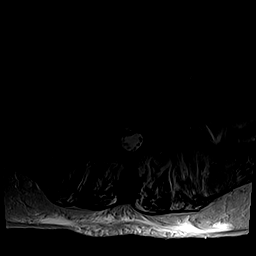
[im 15/46]
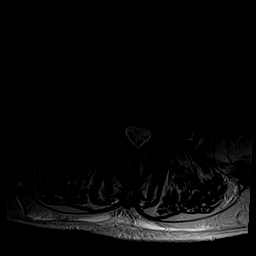
[im 20/46]
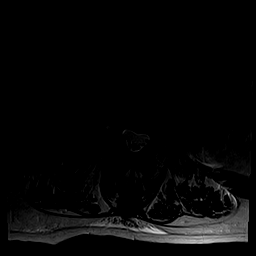
[im 23/46]
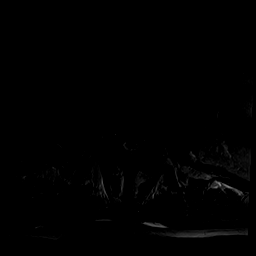
[im 26/46]
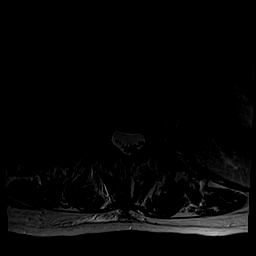
[im 31/46]
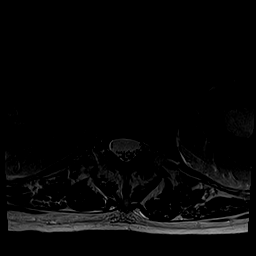
[im 37/46]
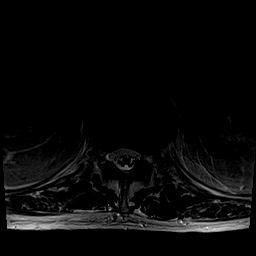
[im 40/46]
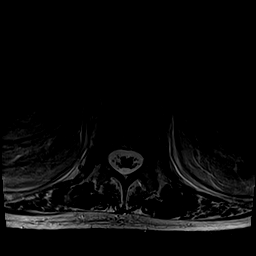
[im 43/46]
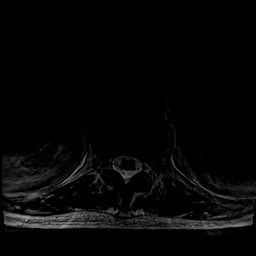

[Series 7: T1 · axial · 4.0mm · 0.35mm/px · z∈[-34,+180]mm · 4 of 46 slices shown (2 of 2)]
[im 3/46]
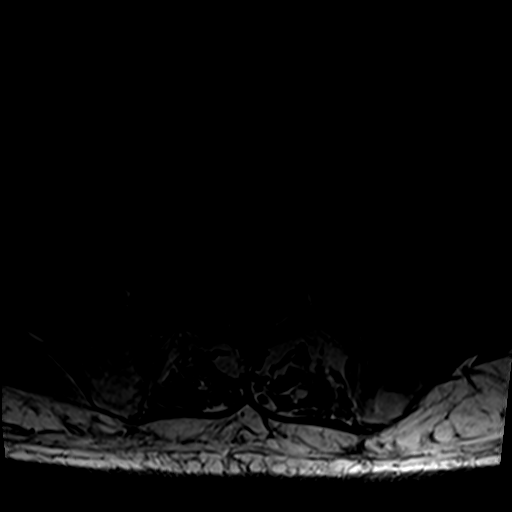
[im 6/46]
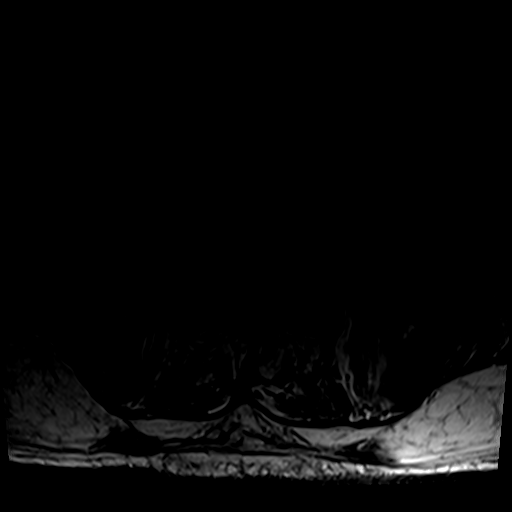
[im 23/46]
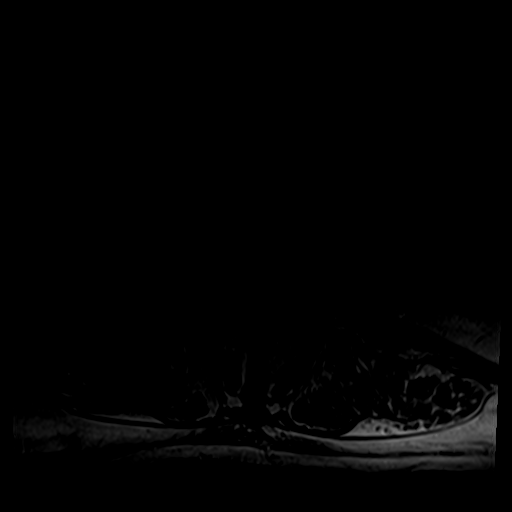
[im 40/46]
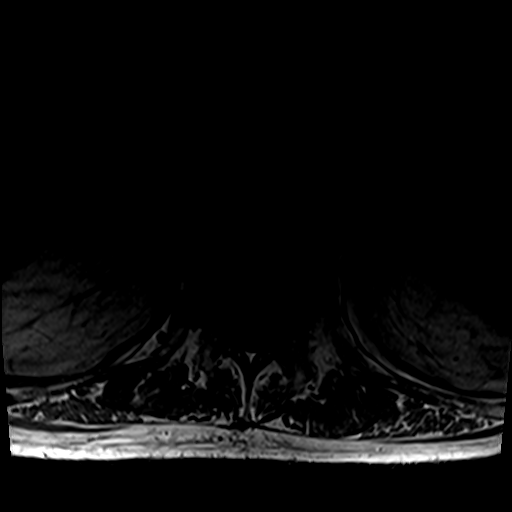

[25 of 48 positions shown; findings below may reference images not displayed]

FINDINGS: Segmentation:  5 lumbar type vertebral bodies.

Alignment:  No significant malalignment.

Vertebrae: Multiple marrow space lesions scattered throughout the
region consistent with metastatic carcinoma or possibly multifocal
lymphoma. There are foci of involvement at T11, T12, L1, L2, L3, L4
and the sacrum. Both iliac bones are involved. No extraosseous tumor
is seen. No tumor encroachment upon the spinal canal or neural
foramina.

Conus medullaris and cauda equina: Conus extends to the L1 level.
Conus and cauda equina appear normal.

Paraspinal and other soft tissues: Distended bladder with
trabeculation. Bilateral hydroureteronephrosis, most likely
secondary to chronic bladder outlet obstruction. Mild aneurysmal
dilatation of the infrarenal abdominal aorta. Maximal diameter
cm.

Disc levels:

T12-L1: Mild bulging of the disc.  No compressive stenosis.

L1-2: Normal disc space.

L2-3: Endplate osteophytes and bulging of the disc. Mild stenosis of
both lateral recesses but no definite neural compression.

L3-4: Endplate osteophytes and bulging of the disc. Mild stenosis of
the lateral recesses. Some potential for neural compression
particularly on the right.

L4-5: Endplate osteophytes and bulging of the disc. Facet and
ligamentous hypertrophy. Stenosis of the lateral recesses right more
than left. Some potential for neural compression on the right.

L5-S1: Endplate osteophytes and bulging of the disc. No central
canal stenosis. No compressive foraminal stenosis.
IMPRESSION: Multifocal bone lesions scattered throughout the lower thoracic
spine, sacrum and iliac bones most consistent with metastatic
carcinoma. Multifocal lymphoma can have this appearance. No
pathologic fracture. No extraosseous tumor or apparent effect upon
the neural structures.

Distended bladder. Bilateral hydroureteronephrosis. Findings suggest
chronic bladder outlet obstruction.

Infrarenal abdominal aortic aneurysm maximal diameter 3.3 cm.
Recommend followup by ultrasound in 3 years. This recommendation
follows ACR consensus guidelines: White Paper of the ACR Incidental
Findings Committee II on Vascular Findings. [HOSPITAL] 3725;

Degenerative disc disease and degenerative facet disease throughout
the lumbar spine. No compressive stenosis seen to explain left leg
symptoms. Mild right more than left lateral recess stenosis at L3-4
and L4-5 that could possibly cause right-sided symptoms.

These results will be called to the ordering clinician or
representative by the Radiologist Assistant, and communication
documented in the PACS or zVision Dashboard. Routine call during
normal [REDACTED] hours is planned.
